# Patient Record
Sex: Male | Born: 1965 | Race: Black or African American | Hispanic: No | Marital: Married | Smoking: Current every day smoker
Health system: Southern US, Community
[De-identification: ages and names within clinical notes are randomized; demographics above are authoritative.]

## PROBLEM LIST (undated history)

## (undated) DIAGNOSIS — J439 Emphysema, unspecified: Secondary | ICD-10-CM

## (undated) DIAGNOSIS — F329 Major depressive disorder, single episode, unspecified: Secondary | ICD-10-CM

## (undated) DIAGNOSIS — F259 Schizoaffective disorder, unspecified: Secondary | ICD-10-CM

## (undated) DIAGNOSIS — R0789 Other chest pain: Secondary | ICD-10-CM

## (undated) DIAGNOSIS — B20 Human immunodeficiency virus [HIV] disease: Secondary | ICD-10-CM

## (undated) DIAGNOSIS — J449 Chronic obstructive pulmonary disease, unspecified: Secondary | ICD-10-CM

## (undated) DIAGNOSIS — W3400XA Accidental discharge from unspecified firearms or gun, initial encounter: Secondary | ICD-10-CM

## (undated) DIAGNOSIS — B1910 Unspecified viral hepatitis B without hepatic coma: Secondary | ICD-10-CM

## (undated) DIAGNOSIS — F1990 Other psychoactive substance use, unspecified, uncomplicated: Secondary | ICD-10-CM

## (undated) DIAGNOSIS — F209 Schizophrenia, unspecified: Secondary | ICD-10-CM

## (undated) DIAGNOSIS — M1990 Unspecified osteoarthritis, unspecified site: Secondary | ICD-10-CM

## (undated) DIAGNOSIS — I10 Essential (primary) hypertension: Secondary | ICD-10-CM

## (undated) DIAGNOSIS — N1330 Unspecified hydronephrosis: Secondary | ICD-10-CM

## (undated) DIAGNOSIS — Z21 Asymptomatic human immunodeficiency virus [HIV] infection status: Secondary | ICD-10-CM

## (undated) DIAGNOSIS — F32A Depression, unspecified: Secondary | ICD-10-CM

## (undated) DIAGNOSIS — M109 Gout, unspecified: Secondary | ICD-10-CM

## (undated) DIAGNOSIS — Z87442 Personal history of urinary calculi: Secondary | ICD-10-CM

## (undated) HISTORY — DX: Unspecified hydronephrosis: N13.30

## (undated) HISTORY — DX: Imprisonment and other incarceration: Z65.1

## (undated) HISTORY — DX: Other chest pain: R07.89

## (undated) HISTORY — DX: Human immunodeficiency virus (HIV) disease: B20

## (undated) HISTORY — DX: Major depressive disorder, single episode, unspecified: F32.9

## (undated) HISTORY — DX: Unspecified viral hepatitis B without hepatic coma: B19.10

## (undated) HISTORY — PX: OTHER SURGICAL HISTORY: SHX169

## (undated) HISTORY — DX: Asymptomatic human immunodeficiency virus (hiv) infection status: Z21

---

## 2003-12-23 ENCOUNTER — Ambulatory Visit (HOSPITAL_COMMUNITY): Admission: RE | Admit: 2003-12-23 | Discharge: 2003-12-23 | Payer: Self-pay | Admitting: Family Medicine

## 2004-01-10 ENCOUNTER — Emergency Department (HOSPITAL_COMMUNITY): Admission: EM | Admit: 2004-01-10 | Discharge: 2004-01-10 | Payer: Self-pay | Admitting: Emergency Medicine

## 2004-03-07 ENCOUNTER — Emergency Department (HOSPITAL_COMMUNITY): Admission: EM | Admit: 2004-03-07 | Discharge: 2004-03-07 | Payer: Self-pay | Admitting: Emergency Medicine

## 2010-06-26 ENCOUNTER — Ambulatory Visit: Payer: Self-pay | Admitting: Internal Medicine

## 2010-06-26 DIAGNOSIS — B20 Human immunodeficiency virus [HIV] disease: Secondary | ICD-10-CM

## 2010-06-26 LAB — CONVERTED CEMR LAB
ALT: 156 units/L — ABNORMAL HIGH (ref 0–53)
Basophils Absolute: 0 10*3/uL (ref 0.0–0.1)
Bilirubin Urine: NEGATIVE
CO2: 25 meq/L (ref 19–32)
Calcium: 9 mg/dL (ref 8.4–10.5)
Chlamydia, Swab/Urine, PCR: NEGATIVE
Chloride: 105 meq/L (ref 96–112)
Cholesterol: 157 mg/dL (ref 0–200)
Glucose, Bld: 86 mg/dL (ref 70–99)
HCV Ab: NEGATIVE
HIV: REACTIVE
Hemoglobin: 13.2 g/dL (ref 13.0–17.0)
Hep A Total Ab: POSITIVE — AB
Hep B Core Total Ab: POSITIVE — AB
Lymphocytes Relative: 43 % (ref 12–46)
Lymphs Abs: 1.4 10*3/uL (ref 0.7–4.0)
Monocytes Absolute: 0.3 10*3/uL (ref 0.1–1.0)
Neutro Abs: 1.6 10*3/uL — ABNORMAL LOW (ref 1.7–7.7)
Platelets: 143 10*3/uL — ABNORMAL LOW (ref 150–400)
RDW: 12.8 % (ref 11.5–15.5)
Sodium: 141 meq/L (ref 135–145)
Specific Gravity, Urine: 1.025 (ref 1.005–1.030)
Total Protein: 7.3 g/dL (ref 6.0–8.3)
Urine Glucose: NEGATIVE mg/dL
Urobilinogen, UA: 0.2 (ref 0.0–1.0)
WBC: 3.3 10*3/uL — ABNORMAL LOW (ref 4.0–10.5)
pH: 5.5 (ref 5.0–8.0)

## 2010-07-11 ENCOUNTER — Ambulatory Visit: Payer: Self-pay | Admitting: Infectious Disease

## 2010-07-11 DIAGNOSIS — F1220 Cannabis dependence, uncomplicated: Secondary | ICD-10-CM

## 2010-07-11 DIAGNOSIS — B181 Chronic viral hepatitis B without delta-agent: Secondary | ICD-10-CM

## 2010-07-11 DIAGNOSIS — F17200 Nicotine dependence, unspecified, uncomplicated: Secondary | ICD-10-CM | POA: Insufficient documentation

## 2010-07-16 ENCOUNTER — Encounter: Payer: Self-pay | Admitting: Infectious Disease

## 2010-07-19 ENCOUNTER — Encounter (INDEPENDENT_AMBULATORY_CARE_PROVIDER_SITE_OTHER): Payer: Self-pay | Admitting: *Deleted

## 2010-08-03 ENCOUNTER — Telehealth (INDEPENDENT_AMBULATORY_CARE_PROVIDER_SITE_OTHER): Payer: Self-pay | Admitting: *Deleted

## 2010-09-10 ENCOUNTER — Telehealth: Payer: Self-pay | Admitting: Infectious Disease

## 2010-11-08 ENCOUNTER — Telehealth: Payer: Self-pay

## 2010-12-18 NOTE — Progress Notes (Signed)
Summary: Requesting medications   Phone Note Call from Patient   Summary of Call: Requsting samples of Atripla,  ADAP referral pending. Pt is is ready to start meds.   CD4 380.  Dr Algis Liming added Atripla at his last OV . Tomasita Morrow RN  September 10, 2010 3:54 PM   Follow-up for Phone Call        Sample given of Atripla Lot # [id number]  exp 05/2012 pt informed he can pick up samples  Tomasita Morrow RN  September 10, 2010 4:03 PM

## 2010-12-18 NOTE — Assessment & Plan Note (Signed)
Summary: Nurse Visit (Infectious Disease)    Medical History  Orders Added: 1)  T-Comprehensive Metabolic Panel [[id number]] 2)  T-CBC w/Diff [[id number]] 3)  T-CD4SP [facility]) [CD4SP] 4)  T-Chlamydia  Probe, urine [phone] 5)  T-GC Probe, urine [phone] 6)  T-Hepatitis B Surface Antigen [52778-24235] 7)  T-Hepatitis B Surface Antibody [36144-31540] 8)  T-Hepatitis B Core Antibody [08676-19509] 9)  T-Hepatitis C Antibody [32671-24580] 10)  T-HIV Antibody  (Reflex) [[id number]] 11)  T-HIV Ab Confirmatory Test/Western Blot [[id number]] 12)  T-Lipid Profile [80061-22930] 13)  T-RPR (Syphilis) [79024-09735] 14)  T-Urinalysis [81003-65000] 15)  T-Hepatitis A Antibody [32992-42683] 16)  T-Hepatitis C Viral Load [87522-80119] 17)  T-HIV1 Quant rflx Ultra or Genotype [phone]  Appended Document: New Patient 042(intake)    Clinical Lists Changes  Observations: Added new observation of REC_MAIL: Yes (06/28/2010 10:11) Added new observation of RECPHONECALL: Yes (06/28/2010 10:11) Added new observation of REC_MESSAGE: Yes (06/28/2010 10:11) Added new observation of INFECTDIS MD: Daiva Eves (06/28/2010 10:11) Added new observation of DATE1STVISIT: 06/25/2010 (06/28/2010 10:11) Added new observation of MARITAL STAT: Single (06/28/2010 10:11) Added new observation of LATINO/HISP: No (06/28/2010 10:11) Added new observation of RACE: African American (06/28/2010 10:11) Added new observation of TB PPDRESULT: negative (06/28/2010 10:11) Added new observation of PPD RESULT: < 5mm (06/28/2010 10:11) Added new observation of TB-PPD RDDTE: 06/07/2010 (06/28/2010 10:11) Added new observation of SH REVIEWED: last 6 months  1 male partner  last year 2 male partners  lifetime 20 partners all male (06/28/2010 10:11) Added new observation of PREVENTPOS: 06/26/2010 (06/28/2010 10:11) Added new observation of GENDER: Male (06/28/2010 10:11) Added new observation of HIV RISK BEH:  MSM (06/28/2010 10:11) Added new observation of HIV INIT DX: 05/09/2010 (06/28/2010 10:11) Added new observation of DRUG USE: Yes (06/28/2010 10:11) Added new observation of ALCOHOL USE: social (06/28/2010 10:11) Added new observation of ALCOHOL COMM: Yes (06/28/2010 10:11) Added new observation of SMOK ADVICE: yes (06/28/2010 10:11) Added new observation of SMOK HX PPD: 1 1/2  (06/28/2010 10:11) Added new observation of SMOK YR ST: at age 37 (06/28/2010 10:11) Added new observation of TOBACCO USE: current (06/28/2010 10:11) Added new observation of PAYOR: No Insurance (06/28/2010 10:11) Added new observation of PLATELETK/UL: 128 K/uL (06/04/2010 16:35) Added new observation of WBC COUNT: 3.9 10*3/microliter (06/04/2010 16:35) Added new observation of HGB: 42.4 g/dL (89/21/1940 74:08) Added new observation of T-HELPER %: 23.5 % (06/04/2010 16:35) Added new observation of CD4 COUNT: 329 microliters (06/04/2010 16:35) Added new observation of HEPAVAX #1: Historical  (06/04/2010 16:29) Added new observation of PNEUMOVAX: Historical  (06/04/2010 16:29) Added new observation of TD BOOSTER: Historical  (06/04/2010 16:29)        Infectious Disease New Patient Intake Referring MD/Agency: [facility] Address: [address] [place], [place] [address]  Return Appointment Date: 07/11/2010  With Physician: Dr Yisroel Ramming Medical Records: Received Health Insurance / Payor: No Insurance Employer: None      Do you have a Primary physician: No Are family members aware of patient's diagnosis?  If so, are they supportive? Brother and Mother  Describe patient's current social support (family, friends, support groups): Good   Medical History Medical Problems OTHER than HIV: No   Medication Allergies: No   Medications: No   Medical Hx Comments: 2008 Biopsy of Right chest mass which was benign per pt during his incarceration.   Family History Diabetes:   Family Side: Maternal, Paternal  Comments:  Father 35, living Mother 87 Living   Tobacco use: current Year started: at  age 55 Amt: 1 1/2  packs per day. Counseled to quit/cut down: yes  Behavioral Health Assessment Have you ever been diagnosed with depression or mental illness? No  Do you drink alcohol? Yes Frequency: social Alcohol Beverage Type(s): alcohol, wine , beer   Do you use recreational drugs? Yes Last Date of Consumption: 06/18/2010 Frequency: frequent  Drugs: marijuana  Type of Use: 3 joints daily  Do you feel you have a problem with drugs and/or alcohol? No   Have you ever been in a treatment facility for any addiction? No If you are currently using drugs and/or alcohol, would you like help for this addiction? No  HIV Intake Information When did you first test positive for HIV? 05/09/2010 Type of test Conducted: WB   Where was this test performed?  Name of Agency: [facility]   City/State: [facility]: [place]  Was this your first time ever being tested or HIV? Yes Risk Factor(s) for HIV: MSM  Method of Exposure to HIV: Homosexual Intercourse-Receptive Have you ever been hospitalized for any HIV-related condition? No  Have you ever been under the care of a physician for being HIV positive? No  Newly Diagnosed Patients Has a Disease Intervention Specialist from the Health Department contacted the patient? Yes.   The patient has been informed that the [facility] Department will contact ALL newly reported cases. Health Department Contact:  [phone]   (SSN is needed for confirmation)  Reported Today: 06/26/2010 Health Department Contact:  [phone]            (SSN is needed for confirmation)  Person Reporting: prev/reported Do you have any Non-HIV related medical conditions or other prior hospitalizations or surgeries? No  HIV Medications Information The patient is currently NOT taking any HIV medications.  Infection History  Patient has been diagnosed with the following  opportunistic infections: Are there any other symptoms you need to discuss? No Have you received literature/education prior to this visit about HIV/AIDS? Yes Do you understand the meaning of a Viral Load? No Do you understand the meaning of a CD4 count? No Lab Values Education/Handout Given Yes Medication Education/Handout Given Yes  Sexual History Are you in a current relationship? No Safe Sex Counseling/Pamphlet Given Sexual History Comments: last 6 months  1 male partner  last year 2 male partners  lifetime 20 partners all male  Evaluation and Follow-Up INTAKE CHECK LIST: HIV Education, Safe Sex Counseling, Case Management Referral, HIV Material Given, Juanell Fairly Consent  Prevention For Positives: 06/26/2010   Safe sex practices discussed with patient. Condoms offered. Juanell Fairly Consent: Yes Are you in need of condoms at this time? No Our patient has been informed that condoms are always available in this clinic.   Brochure Provided for Above Organizations? Yes Name of Agency: [facility] Referral Does patient have problems that warrant Social Worker referral? No   Immunization History:  Tetanus/Td Immunization History:    Tetanus/Td:  historical (06/04/2010)  Pneumovax Immunization History:    Pneumovax:  historical (06/04/2010)  Hepatitis A Immunization History:    Hepatitis A # 1:  historical (06/04/2010)  PPD Results    Date of reading: 06/07/2010    Results: < 5mm    Interpretation: negative     -  Date:  06/04/2010    Hemoglobin: 42.4    WBC: 3.9    Platelets: 128           Prevention For Positives: 06/26/2010   Safe sex  practices discussed with patient. Condoms offered.

## 2010-12-18 NOTE — Progress Notes (Signed)
Summary: ncadap pending approval  Phone Note Other Incoming   Caller: Francia Greaves @ [facility] Reason for Call: Get patient information Summary of Call: patient's SW called on behalf of him wanting to know if he has medication as of yet.  I called back to [facility] to let Baird Lyons know that I have not heard whether patient has been approved as of yet to receive medications through ADAP.  I did explain to [facility] the waiting process.  I also called down to [facility] to the [facility] (Purchase of Medical Care  Services-ADAP ) office to speak with one of the CM for the state ADAP program.  The original CM is Sheralyn Boatman who is on vac this week.  I left a message on another CM--Mike Teena Dunk voicemail to check into seeing if [place] has been approved so I can get the appropriate information needed to call in patient's medications so he may get started taking them. Initial call taken by: Paulo Fruit  BS,CPht II,MPH,  August 03, 2010 4:00 PM

## 2010-12-18 NOTE — Miscellaneous (Signed)
Summary: clinical update/ryan white adap app completed  Clinical Lists Changes  Observations: Added new observation of RWTITLE: B (07/19/2010 14:50) Added new observation of AIDSDAP: Pending (07/19/2010 14:50) Added new observation of INCOMESOURCE: none (07/19/2010 14:50) Added new observation of HOUSEINCOME: 0  (07/19/2010 14:50) Added new observation of #CHILD<18 IN: No  (07/19/2010 14:50) Added new observation of FAMILYSIZE: 1  (07/19/2010 14:50) Added new observation of HOUSING: Stable/permanent  (07/19/2010 14:50) Added new observation of FINASSESSDT: 07/12/2010  (07/19/2010 14:50) Added new observation of RW VITAL STA: Active  (07/19/2010 14:50) Added new observation of PATNTCOUNTY: [place]  (07/19/2010 14:50) Added new observation of RWPARTICIP: Yes  (07/19/2010 14:50)

## 2010-12-18 NOTE — Miscellaneous (Signed)
Summary: HIPAA Restrictions  HIPAA Restrictions   Imported By: Florinda Marker 06/28/2010 09:40:10  _____________________________________________________________________  External Attachment:    Type:   Image     Comment:   External Document

## 2010-12-18 NOTE — Assessment & Plan Note (Signed)
Summary: new 042   Visit Type:  Consult  CC:  new pt. to establish and lab results.  History of Present Illness: 45 yo AA male with HIV,  hepatits B  (Sag positive, core ab positive) cd4 of just in the 300s who presents to clinic for consieration of antivral therapy. I spent greater than 45 minutes with this pt including greater than 50% time face to face counselling. We reviewed all of the first line DHSS options and newest one pill once a day compleara as well. IN the end pt preferred atripla and I think he can comply with nightly atripla.   Problems Prior to Update: 1)  HIV Infection  (ICD-042)  Medications Prior to Update: 1)  None  Current Medications (verified): 1)  Atripla 600-200-300 Mg Tabs (Efavirenz-Emtricitab-Tenofovir)  Allergies (verified): No Known Drug Allergies   Preventive Screening-Counseling & Management  Alcohol-Tobacco     Alcohol drinks/day: socially     Alcohol type: all     Smoking Status: current     Packs/Day: 1.5     Year Quit: 1977  Caffeine-Diet-Exercise     Caffeine use/day: coffee, tea, soda 2 per day     Does Patient Exercise: yes     Type of exercise: cardio, weights     Exercise (avg: min/session): >60     Times/week: 7  Safety-Violence-Falls     Seat Belt Use: yes      Drug Use:  current and marijuana.    Comments: pt. given condoms   Current Allergies (reviewed today): No known allergies  Past History:  Past Medical History: HIV Hepatitis B Smoker Tattoos  Past Surgical History: none  Family History: noncontributory  Social History: unemployed, smokes tobacco and mairijuan occasional alcohol.Drug Use:  current, marijuana  Vital Signs:  Patient profile:   45 year old male Height:      69 inches (175.26 cm) Weight:      168.9 pounds (76.77 kg) BMI:     25.03 Temp:     98.5 degrees F (36.94 degrees C) oral Pulse rate:   76 / minute BP sitting:   125 / 82  (right arm)  Vitals Entered By: Wendall Mola  CMA Duncan Dull) (July 11, 2010 3:04 PM) CC: new pt. to establish and lab results Is Patient Diabetic? No Pain Assessment Patient in pain? no      Nutritional Status BMI of 25 - 29 = overweight Nutritional Status Detail appetite "good"  Have you ever been in a relationship where you felt threatened, hurt or afraid?No   Does patient need assistance? Functional Status Self care Ambulation Normal   Physical Exam  General:  alert, well-developed, well-nourished, and well-hydrated.   Head:  normocephalic and atraumatic.   Eyes:  vision grossly intact, pupils equal, and pupils round.   Ears:  no external deformities.   Nose:  no external deformity and no external erythema.   Mouth:  pharynx pink and moist, no erythema, and no exudates.   Neck:  supple and full ROM.   Lungs:  normal respiratory effort, no crackles, and no wheezes.   Heart:  normal rate, regular rhythm, no murmur, no gallop, and no rub.   Abdomen:  soft and no distention.   Msk:  normal ROM and no joint tenderness.   Extremities:  No clubbing, cyanosis, edema, or deformity noted with normal full range of motion of all joints.   Neurologic:  alert & oriented X3, strength normal in all extremities, and gait normal.  Skin:  tatoosno rashes.   Psych:  Oriented X3, memory intact for recent and remote, and normally interactive.             Prevention For Positives: 07/11/2010   Safe sex practices discussed with patient. Condoms offered.   Education Materials Provided: 07/11/2010 Safe sex practices discussed with patient. Condoms offered.                          Impression & Recommendations:  Problem # 1:  HIV INFECTION (ICD-042) Will start atripla, returnt o clinic in 6 wks Orders: Consultation Level V (99245)Future Orders: T-CD4SP ([facility]) (CD4SP) ... 08/13/2010 T-HIV Viral Load [phone]) ... 08/13/2010 T-CBC w/Diff ([id number]) ... 08/13/2010 T-Comprehensive Metabolic Panel [phone]) ...  08/13/2010 T-Hepatitis B Surface Antibody [phone]) ... 08/13/2010  Problem # 2:  HEPATITIS B, CHRONIC (ICD-070.32)  seems likely. 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    Immunizations Administered:  Influenza Vaccine # 1:    Vaccine Type: Fluvax Non-MCR    Site: right deltoid    Mfr: Novartis    Dose: 0.5 ml    Route: IM    Given by: Kathi Simpers CMA(AAMA)    Exp. Date: 02/17/2011    Lot #: [id number]    VIS given: 06/11/07 version given July 11, 2010.  Flu Vaccine Consent Questions:    Do you have a history of severe allergic reactions to this vaccine? no    Any prior history of allergic reactions to egg and/or gelatin? no    Do you have a sensitivity to the preservative Thimersol? no    Do you have a past history of Guillan-Barre Syndrome? no    Do you currently have an acute febrile illness? no    Have you ever had a severe reaction to latex? no    Vaccine information given and explained to patient? yes Prescriptions: ATRIPLA 600-200-300 MG TABS (EFAVIRENZ-EMTRICITAB-TENOFOVIR)   #30 x 11   Entered and Authorized by:   Acey Lav MD   Signed by:   Paulette Blanch Dam MD on 07/11/2010   Method used:   Print then Give to Patient   RxID:   [id number]

## 2010-12-18 NOTE — Miscellaneous (Signed)
Summary: [facility]  [place] [facility]   Imported By: Florinda Marker 07/16/2010 16:04:46  _____________________________________________________________________  External Attachment:    Type:   Image     Comment:   External Document

## 2010-12-20 NOTE — Progress Notes (Signed)
Summary: [facility] calling  Phone Note From Pharmacy   Caller: [facility]  Summary of Call: Trying to contact patient with medicaiton refills.  He is not responding at the above phone number.  Tomasita Morrow RN  November 08, 2010 9:41 AM

## 2011-02-01 LAB — T-HELPER CELL (CD4) - (RCID CLINIC ONLY): CD4 T Cell Abs: 380 uL — ABNORMAL LOW (ref 400–2700)

## 2011-02-05 ENCOUNTER — Emergency Department (HOSPITAL_COMMUNITY): Payer: Self-pay

## 2011-02-05 ENCOUNTER — Emergency Department (HOSPITAL_COMMUNITY)
Admission: EM | Admit: 2011-02-05 | Discharge: 2011-02-05 | Disposition: A | Discharge status: Home / Self Care | Payer: Self-pay | Attending: Emergency Medicine | Admitting: Emergency Medicine

## 2011-02-05 DIAGNOSIS — S60229A Contusion of unspecified hand, initial encounter: Secondary | ICD-10-CM | POA: Insufficient documentation

## 2011-02-05 DIAGNOSIS — M25539 Pain in unspecified wrist: Secondary | ICD-10-CM | POA: Insufficient documentation

## 2011-02-05 DIAGNOSIS — M79609 Pain in unspecified limb: Secondary | ICD-10-CM | POA: Insufficient documentation

## 2011-02-05 DIAGNOSIS — Z21 Asymptomatic human immunodeficiency virus [HIV] infection status: Secondary | ICD-10-CM | POA: Insufficient documentation

## 2011-02-05 DIAGNOSIS — S60219A Contusion of unspecified wrist, initial encounter: Secondary | ICD-10-CM | POA: Insufficient documentation

## 2011-11-20 ENCOUNTER — Other Ambulatory Visit (INDEPENDENT_AMBULATORY_CARE_PROVIDER_SITE_OTHER): Payer: Self-pay

## 2011-11-20 ENCOUNTER — Other Ambulatory Visit: Payer: Self-pay | Admitting: Infectious Disease

## 2011-11-20 DIAGNOSIS — B20 Human immunodeficiency virus [HIV] disease: Secondary | ICD-10-CM

## 2011-11-20 DIAGNOSIS — Z79899 Other long term (current) drug therapy: Secondary | ICD-10-CM

## 2011-11-20 DIAGNOSIS — Z113 Encounter for screening for infections with a predominantly sexual mode of transmission: Secondary | ICD-10-CM

## 2011-11-21 ENCOUNTER — Telehealth: Payer: Self-pay | Admitting: Infectious Disease

## 2011-11-21 LAB — URINALYSIS, ROUTINE W REFLEX MICROSCOPIC
Bilirubin Urine: NEGATIVE
Glucose, UA: NEGATIVE mg/dL
Leukocytes, UA: NEGATIVE
Specific Gravity, Urine: 1.024 (ref 1.005–1.030)

## 2011-11-21 LAB — COMPREHENSIVE METABOLIC PANEL
Alkaline Phosphatase: 83 U/L (ref 39–117)
BUN: 14 mg/dL (ref 6–23)
Creat: 1.24 mg/dL (ref 0.50–1.35)
Glucose, Bld: 91 mg/dL (ref 70–99)
Sodium: 141 mEq/L (ref 135–145)
Total Bilirubin: 0.7 mg/dL (ref 0.3–1.2)

## 2011-11-21 LAB — T-HELPER CELL (CD4) - (RCID CLINIC ONLY)
CD4 % Helper T Cell: 23 % — ABNORMAL LOW (ref 33–55)
CD4 T Cell Abs: 390 uL — ABNORMAL LOW (ref 400–2700)

## 2011-11-21 LAB — LIPID PANEL
Cholesterol: 149 mg/dL (ref 0–200)
HDL: 37 mg/dL — ABNORMAL LOW (ref >39)
Triglycerides: 60 mg/dL (ref <150)

## 2011-11-21 LAB — RPR

## 2011-11-21 LAB — CBC WITH DIFFERENTIAL/PLATELET
Basophils Relative: 0 % (ref 0–1)
Eosinophils Absolute: 0 10*3/uL (ref 0.0–0.7)
Hemoglobin: 13.7 g/dL (ref 13.0–17.0)
MCH: 32.4 pg (ref 26.0–34.0)
MCHC: 34.3 g/dL (ref 30.0–36.0)
Monocytes Absolute: 0.2 10*3/uL (ref 0.1–1.0)
Monocytes Relative: 5 % (ref 3–12)
Neutrophils Relative %: 54 % (ref 43–77)

## 2011-11-21 MED ORDER — AZITHROMYCIN 500 MG PO TABS: 1000.0000 mg | ORAL_TABLET | Freq: Every day | ORAL | Status: DC

## 2011-11-21 NOTE — Telephone Encounter (Signed)
Chlamydia needs to be treated with azithromycin 1gram daily, vs doxycyline 100mg  twice daily for 7 days. Curtis Hodge can we bring pt here for the 1gram dose of azithro?

## 2011-11-22 ENCOUNTER — Other Ambulatory Visit: Payer: Self-pay | Admitting: Licensed Clinical Social Worker

## 2011-11-22 DIAGNOSIS — A749 Chlamydial infection, unspecified: Secondary | ICD-10-CM

## 2011-11-22 LAB — HIV-1 RNA QUANT-NO REFLEX-BLD: HIV-1 RNA Quant, Log: 3.87 {Log} — ABNORMAL HIGH (ref <1.30)

## 2011-11-22 MED ORDER — AZITHROMYCIN 250 MG PO TABS
1000.0000 mg | ORAL_TABLET | Freq: Once | ORAL | Status: AC
  Administered 2011-11-22: 1000 mg via ORAL

## 2011-11-22 NOTE — Telephone Encounter (Signed)
Called patient and asked him to return my call, the patient has a positive chlamydia test and needs to come to the office for a dose of azithromycin per Dr. Daiva Eves

## 2011-11-22 NOTE — Telephone Encounter (Signed)
Spoke with the patient and he is coming in today to take the azithromycin

## 2011-12-04 ENCOUNTER — Ambulatory Visit: Payer: Self-pay | Admitting: Infectious Disease

## 2011-12-06 ENCOUNTER — Encounter: Payer: Self-pay | Admitting: Infectious Disease

## 2011-12-06 ENCOUNTER — Ambulatory Visit (INDEPENDENT_AMBULATORY_CARE_PROVIDER_SITE_OTHER): Payer: Self-pay | Admitting: Infectious Disease

## 2011-12-06 VITALS — BP 132/76 | HR 74 | Temp 98.1°F | Wt 168.2 lb

## 2011-12-06 DIAGNOSIS — F17200 Nicotine dependence, unspecified, uncomplicated: Secondary | ICD-10-CM

## 2011-12-06 DIAGNOSIS — Z23 Encounter for immunization: Secondary | ICD-10-CM

## 2011-12-06 DIAGNOSIS — B20 Human immunodeficiency virus [HIV] disease: Secondary | ICD-10-CM

## 2011-12-06 DIAGNOSIS — B181 Chronic viral hepatitis B without delta-agent: Secondary | ICD-10-CM

## 2011-12-06 DIAGNOSIS — F1220 Cannabis dependence, uncomplicated: Secondary | ICD-10-CM

## 2011-12-06 MED ORDER — ONDANSETRON 4 MG PO TBDP: 4.0000 mg | ORAL_TABLET | Freq: Three times a day (TID) | ORAL | Status: AC | PRN

## 2011-12-06 MED ORDER — EMTRICITABINE-TENOFOVIR DF 200-300 MG PO TABS: 1.0000 | ORAL_TABLET | Freq: Every day | ORAL | Status: DC

## 2011-12-06 MED ORDER — DARUNAVIR ETHANOLATE 800 MG PO TABS: 800.0000 mg | ORAL_TABLET | Freq: Every day | ORAL | Status: DC

## 2011-12-06 MED ORDER — RITONAVIR 100 MG PO TABS: 100.0000 mg | ORAL_TABLET | Freq: Every day | ORAL | Status: DC

## 2011-12-06 NOTE — Assessment & Plan Note (Signed)
truvada will take care of this

## 2011-12-06 NOTE — Assessment & Plan Note (Signed)
I am not terribly worried about this

## 2011-12-06 NOTE — Assessment & Plan Note (Signed)
Will work on this in the future. He has struggled with quiting in the past

## 2011-12-06 NOTE — Progress Notes (Signed)
  Subjective:    Patient ID: Curtis Hodge, male    DOB: 1966/10/22, 46 y.o.   MRN: [id number]  HPI  46 year old [place] American male with HIV, Hepatitis B coinfection whom I saw in August of 2011 and attempted to rx atripla. He never filled the med and was lost to followup until brought in by [facility] counselor. He has apparently filled out all of his ADAP paperwork. We had exhaustive discussion of ARV regimens and I recommended we go with prezista norvir and truvada for now to make sure he is highly compliant to to prevent R emerging. This will also rx his Hep B. We gave him flu shot today as well. I spent greater than 45 minutes with the patient including greater than 50% of time in face to face counsel of the patient and in coordination of their care.   Review of Systems  Constitutional: Negative for fever, chills, diaphoresis, activity change, appetite change, fatigue and unexpected weight change.  HENT: Negative for congestion, sore throat, rhinorrhea, sneezing, trouble swallowing and sinus pressure.   Eyes: Negative for photophobia and visual disturbance.  Respiratory: Negative for cough, chest tightness, shortness of breath, wheezing and stridor.   Cardiovascular: Negative for chest pain, palpitations and leg swelling.  Gastrointestinal: Negative for nausea, vomiting, abdominal pain, diarrhea, constipation, blood in stool, abdominal distention and anal bleeding.  Genitourinary: Negative for dysuria, hematuria, flank pain and difficulty urinating.  Musculoskeletal: Negative for myalgias, back pain, joint swelling, arthralgias and gait problem.  Skin: Negative for color change, pallor, rash and wound.  Neurological: Negative for dizziness, tremors, weakness and light-headedness.  Hematological: Negative for adenopathy. Does not bruise/bleed easily.  Psychiatric/Behavioral: Negative for behavioral problems, confusion, sleep disturbance, dysphoric mood, decreased concentration and agitation.        Objective:   Physical Exam  Constitutional: He is oriented to person, place, and time. He appears well-developed and well-nourished. No distress.  HENT:  Head: Normocephalic and atraumatic.  Mouth/Throat: Oropharynx is clear and moist. No oropharyngeal exudate.  Eyes: Conjunctivae and EOM are normal. Pupils are equal, round, and reactive to light. No scleral icterus.  Neck: Normal range of motion. Neck supple. No JVD present.  Cardiovascular: Normal rate, regular rhythm and normal heart sounds.  Exam reveals no gallop and no friction rub.   No murmur heard. Pulmonary/Chest: Effort normal and breath sounds normal. No respiratory distress. He has no wheezes. He has no rales. He exhibits no tenderness.  Abdominal: He exhibits no distension and no mass. There is no tenderness. There is no rebound and no guarding.  Musculoskeletal: He exhibits no edema and no tenderness.  Lymphadenopathy:    He has no cervical adenopathy.  Neurological: He is alert and oriented to person, place, and time. He has normal reflexes. He exhibits normal muscle tone. Coordination normal.  Skin: Skin is warm and dry. He is not diaphoretic. No erythema. No pallor.  Psychiatric: He has a normal mood and affect. His behavior is normal. Judgment and thought content normal.          Assessment & Plan:  HIV INFECTION Start prezista norvir and truvada. zofran prn for nausea in case he ahs this with this PI regimen, bring back in a month for labs  HEPATITIS B, CHRONIC truvada will take care of this  SMOKER Will work on this in the future. He has struggled with quiting in the past  CANNABIS ABUSE, EPISODIC I am not terribly worried about this

## 2011-12-06 NOTE — Assessment & Plan Note (Signed)
Start prezista norvir and [place]. zofran prn for nausea in case he ahs this with this PI regimen, bring back in a month for labs

## 2011-12-10 ENCOUNTER — Telehealth: Payer: Self-pay | Admitting: *Deleted

## 2011-12-10 NOTE — Telephone Encounter (Signed)
RN could not ascertain why the pt was called and the pt didn't know either.

## 2011-12-17 ENCOUNTER — Encounter: Payer: Self-pay | Admitting: *Deleted

## 2011-12-18 ENCOUNTER — Telehealth: Payer: Self-pay | Admitting: *Deleted

## 2011-12-18 NOTE — Telephone Encounter (Signed)
Gearldine Bienenstock, a nurse from the health dept called [phone]) & asked to speak with [place] about treatment for this pt. I called her back & left a message asking her to call me back

## 2011-12-18 NOTE — Telephone Encounter (Signed)
Called and left Brandy at the health department a message [phone]). Patient was treated for Chlamydia with Azithromycin 1 gram on 11/22/11. Wendall Mola CMA

## 2011-12-19 ENCOUNTER — Other Ambulatory Visit: Payer: Self-pay | Admitting: *Deleted

## 2011-12-19 DIAGNOSIS — B20 Human immunodeficiency virus [HIV] disease: Secondary | ICD-10-CM

## 2011-12-19 MED ORDER — EMTRICITABINE-TENOFOVIR DF 200-300 MG PO TABS: 1.0000 | ORAL_TABLET | Freq: Every day | ORAL | Status: DC

## 2011-12-19 MED ORDER — RITONAVIR 100 MG PO TABS: 100.0000 mg | ORAL_TABLET | Freq: Every day | ORAL | Status: DC

## 2011-12-19 MED ORDER — DARUNAVIR ETHANOLATE 800 MG PO TABS: 800.0000 mg | ORAL_TABLET | Freq: Every day | ORAL | Status: DC

## 2011-12-19 NOTE — Telephone Encounter (Signed)
He has ADAP now. [facility]

## 2012-01-01 ENCOUNTER — Other Ambulatory Visit: Payer: Self-pay | Admitting: Infectious Disease

## 2012-01-01 ENCOUNTER — Other Ambulatory Visit: Payer: Self-pay

## 2012-01-01 DIAGNOSIS — B20 Human immunodeficiency virus [HIV] disease: Secondary | ICD-10-CM

## 2012-01-01 LAB — COMPLETE METABOLIC PANEL WITH GFR
Alkaline Phosphatase: 87 U/L (ref 39–117)
Creat: 1.16 mg/dL (ref 0.50–1.35)
GFR, Est Non African American: 76 mL/min
Glucose, Bld: 90 mg/dL (ref 70–99)
Sodium: 143 mEq/L (ref 135–145)
Total Bilirubin: 0.3 mg/dL (ref 0.3–1.2)
Total Protein: 7.3 g/dL (ref 6.0–8.3)

## 2012-01-01 LAB — CBC WITH DIFFERENTIAL/PLATELET
Basophils Relative: 0 % (ref 0–1)
HCT: 39.3 % (ref 39.0–52.0)
Hemoglobin: 13.9 g/dL (ref 13.0–17.0)
Lymphocytes Relative: 48 % — ABNORMAL HIGH (ref 12–46)
Lymphs Abs: 1.5 10*3/uL (ref 0.7–4.0)
MCHC: 35.4 g/dL (ref 30.0–36.0)
Monocytes Absolute: 0.1 10*3/uL (ref 0.1–1.0)
Monocytes Relative: 4 % (ref 3–12)
Neutro Abs: 1.5 10*3/uL — ABNORMAL LOW (ref 1.7–7.7)
RBC: 4.18 MIL/uL — ABNORMAL LOW (ref 4.22–5.81)

## 2012-01-02 LAB — T-HELPER CELL (CD4) - (RCID CLINIC ONLY)
CD4 % Helper T Cell: 23 % — ABNORMAL LOW (ref 33–55)
CD4 T Cell Abs: 350 uL — ABNORMAL LOW (ref 400–2700)

## 2012-01-03 LAB — HIV-1 RNA QUANT-NO REFLEX-BLD: HIV-1 RNA Quant, Log: 3.91 {Log} — ABNORMAL HIGH (ref <1.30)

## 2012-01-08 ENCOUNTER — Other Ambulatory Visit: Payer: Self-pay | Admitting: Infectious Disease

## 2012-01-08 DIAGNOSIS — B20 Human immunodeficiency virus [HIV] disease: Secondary | ICD-10-CM

## 2012-01-08 NOTE — Progress Notes (Signed)
Ok I told Clydie Braun in the lab and she will add it.

## 2012-01-16 LAB — HIV-1 GENOTYPR PLUS

## 2012-01-22 ENCOUNTER — Ambulatory Visit: Payer: Self-pay | Admitting: Infectious Disease

## 2012-01-29 ENCOUNTER — Encounter: Payer: Self-pay | Admitting: Infectious Disease

## 2012-01-29 ENCOUNTER — Ambulatory Visit (INDEPENDENT_AMBULATORY_CARE_PROVIDER_SITE_OTHER): Payer: Self-pay | Admitting: Infectious Disease

## 2012-01-29 VITALS — BP 144/78 | HR 78 | Temp 97.7°F | Wt 161.0 lb

## 2012-01-29 DIAGNOSIS — IMO0001 Reserved for inherently not codable concepts without codable children: Secondary | ICD-10-CM

## 2012-01-29 DIAGNOSIS — B181 Chronic viral hepatitis B without delta-agent: Secondary | ICD-10-CM

## 2012-01-29 DIAGNOSIS — B20 Human immunodeficiency virus [HIV] disease: Secondary | ICD-10-CM

## 2012-01-29 DIAGNOSIS — F17200 Nicotine dependence, unspecified, uncomplicated: Secondary | ICD-10-CM

## 2012-01-29 LAB — COMPLETE METABOLIC PANEL WITH GFR
ALT: 18 U/L (ref 0–53)
CO2: 25 mEq/L (ref 19–32)
Calcium: 9 mg/dL (ref 8.4–10.5)
Chloride: 107 mEq/L (ref 96–112)
GFR, Est African American: 78 mL/min
GFR, Est Non African American: 68 mL/min
Glucose, Bld: 93 mg/dL (ref 70–99)
Sodium: 138 mEq/L (ref 135–145)
Total Protein: 7.3 g/dL (ref 6.0–8.3)

## 2012-01-29 LAB — CBC WITH DIFFERENTIAL/PLATELET
Basophils Absolute: 0 10*3/uL (ref 0.0–0.1)
Basophils Relative: 0 % (ref 0–1)
Eosinophils Absolute: 0 10*3/uL (ref 0.0–0.7)
Eosinophils Relative: 1 % (ref 0–5)
HCT: 41.9 % (ref 39.0–52.0)
MCH: 33.4 pg (ref 26.0–34.0)
MCHC: 35.1 g/dL (ref 30.0–36.0)
MCV: 95.2 fL (ref 78.0–100.0)
Monocytes Absolute: 0.2 10*3/uL (ref 0.1–1.0)
Platelets: 156 10*3/uL (ref 150–400)
RDW: 12.4 % (ref 11.5–15.5)

## 2012-01-29 MED ORDER — VARENICLINE TARTRATE 0.5 MG X 11 & 1 MG X 42 PO MISC: ORAL | Status: AC

## 2012-01-29 MED ORDER — VARENICLINE TARTRATE 1 MG PO TABS: 1.0000 mg | ORAL_TABLET | Freq: Two times a day (BID) | ORAL | Status: AC

## 2012-01-29 NOTE — Progress Notes (Signed)
  Subjective:    Patient ID: Curtis Hodge, male    DOB: [birth year], 46 y.o.   MRN: [id number]  HPI  46 year old [place] American man with HIV, just recently started on prezista norvir and truvada. We know his viral load was 7k in early January but dont know value on day he started rx, in Feb was 8k. CD4 still in 300 range. He is interested in smoking cesation and I will give him chantix. He is also concerned aobut his ability to build muscle mass and gain weight which has become more difficult recently.  Review of Systems  Constitutional: Negative for fever, chills, diaphoresis, activity change, appetite change, fatigue and unexpected weight change.  HENT: Negative for congestion, sore throat, rhinorrhea, sneezing, trouble swallowing and sinus pressure.   Eyes: Negative for photophobia and visual disturbance.  Respiratory: Negative for cough, chest tightness, shortness of breath, wheezing and stridor.   Cardiovascular: Negative for chest pain, palpitations and leg swelling.  Gastrointestinal: Negative for nausea, vomiting, abdominal pain, diarrhea, constipation, blood in stool, abdominal distention and anal bleeding.  Genitourinary: Negative for dysuria, hematuria, flank pain and difficulty urinating.  Musculoskeletal: Negative for myalgias, back pain, joint swelling, arthralgias and gait problem.  Skin: Negative for color change, pallor, rash and wound.  Neurological: Negative for dizziness, tremors, weakness and light-headedness.  Hematological: Negative for adenopathy. Does not bruise/bleed easily.  Psychiatric/Behavioral: Negative for behavioral problems, confusion, sleep disturbance, dysphoric mood, decreased concentration and agitation.       Objective:   Physical Exam  Constitutional: He is oriented to person, place, and time. He appears well-developed and well-nourished. No distress.  HENT:  Head: Normocephalic and atraumatic.  Mouth/Throat: Oropharynx is clear and moist. No  oropharyngeal exudate.  Eyes: Conjunctivae and EOM are normal. Pupils are equal, round, and reactive to light. No scleral icterus.  Neck: Normal range of motion. Neck supple. No JVD present.  Cardiovascular: Normal rate, regular rhythm and normal heart sounds.  Exam reveals no gallop and no friction rub.   No murmur heard. Pulmonary/Chest: Effort normal and breath sounds normal. No respiratory distress. He has no wheezes. He has no rales. He exhibits no tenderness.  Abdominal: He exhibits no distension and no mass. There is no tenderness. There is no rebound and no guarding.  Musculoskeletal: He exhibits no edema and no tenderness.  Lymphadenopathy:    He has no cervical adenopathy.  Neurological: He is alert and oriented to person, place, and time. He has normal reflexes. He exhibits normal muscle tone. Coordination normal.  Skin: Skin is warm and dry. He is not diaphoretic. No erythema. No pallor.  Psychiatric: He has a normal mood and affect. His behavior is normal. Judgment and thought content normal.          Assessment & Plan:

## 2012-01-29 NOTE — Assessment & Plan Note (Signed)
Recheck labs today, rtc in 2 months

## 2012-01-29 NOTE — Assessment & Plan Note (Signed)
rx chantix

## 2012-01-29 NOTE — Assessment & Plan Note (Signed)
On truvada 

## 2012-01-30 LAB — GC/CHLAMYDIA PROBE AMP, URINE: Chlamydia, Swab/Urine, PCR: NEGATIVE

## 2012-01-30 LAB — T-HELPER CELL (CD4) - (RCID CLINIC ONLY): CD4 % Helper T Cell: 26 % — ABNORMAL LOW (ref 33–55)

## 2012-05-21 ENCOUNTER — Encounter (HOSPITAL_COMMUNITY): Payer: Self-pay | Admitting: *Deleted

## 2012-05-21 ENCOUNTER — Encounter (HOSPITAL_COMMUNITY): Payer: Self-pay | Admitting: Anesthesiology

## 2012-05-21 ENCOUNTER — Emergency Department (HOSPITAL_COMMUNITY)
Admission: EM | Admit: 2012-05-21 | Discharge: 2012-05-21 | Disposition: A | Discharge status: Home / Self Care | Payer: Self-pay | Attending: Emergency Medicine | Admitting: Emergency Medicine

## 2012-05-21 ENCOUNTER — Emergency Department (HOSPITAL_COMMUNITY): Payer: Self-pay | Admitting: Anesthesiology

## 2012-05-21 ENCOUNTER — Encounter (HOSPITAL_COMMUNITY)
Admission: EM | Disposition: A | Discharge status: Home / Self Care | Payer: Self-pay | Source: Home / Self Care | Attending: Emergency Medicine

## 2012-05-21 ENCOUNTER — Emergency Department (HOSPITAL_COMMUNITY): Payer: Self-pay

## 2012-05-21 DIAGNOSIS — Y939 Activity, unspecified: Secondary | ICD-10-CM | POA: Insufficient documentation

## 2012-05-21 DIAGNOSIS — W268XXA Contact with other sharp object(s), not elsewhere classified, initial encounter: Secondary | ICD-10-CM | POA: Insufficient documentation

## 2012-05-21 DIAGNOSIS — IMO0002 Reserved for concepts with insufficient information to code with codable children: Secondary | ICD-10-CM

## 2012-05-21 DIAGNOSIS — S41109A Unspecified open wound of unspecified upper arm, initial encounter: Secondary | ICD-10-CM | POA: Insufficient documentation

## 2012-05-21 DIAGNOSIS — S0180XA Unspecified open wound of other part of head, initial encounter: Secondary | ICD-10-CM | POA: Insufficient documentation

## 2012-05-21 DIAGNOSIS — Z21 Asymptomatic human immunodeficiency virus [HIV] infection status: Secondary | ICD-10-CM | POA: Insufficient documentation

## 2012-05-21 DIAGNOSIS — Z1881 Retained glass fragments: Secondary | ICD-10-CM | POA: Insufficient documentation

## 2012-05-21 DIAGNOSIS — F17200 Nicotine dependence, unspecified, uncomplicated: Secondary | ICD-10-CM | POA: Insufficient documentation

## 2012-05-21 DIAGNOSIS — T7421XA Adult sexual abuse, confirmed, initial encounter: Secondary | ICD-10-CM | POA: Insufficient documentation

## 2012-05-21 HISTORY — PX: I & D EXTREMITY: SHX5045

## 2012-05-21 LAB — BASIC METABOLIC PANEL
BUN: 13 mg/dL (ref 6–23)
CO2: 22 mEq/L (ref 19–32)
Chloride: 101 mEq/L (ref 96–112)
Creatinine, Ser: 1.33 mg/dL (ref 0.50–1.35)
GFR calc Af Amer: 73 mL/min — ABNORMAL LOW (ref >90)

## 2012-05-21 LAB — CBC WITH DIFFERENTIAL/PLATELET
Basophils Relative: 0 % (ref 0–1)
Eosinophils Relative: 0 % (ref 0–5)
HCT: 37.5 % — ABNORMAL LOW (ref 39.0–52.0)
Hemoglobin: 13.1 g/dL (ref 13.0–17.0)
MCH: 33.6 pg (ref 26.0–34.0)
MCHC: 34.9 g/dL (ref 30.0–36.0)
MCV: 96.2 fL (ref 78.0–100.0)
Monocytes Absolute: 0.3 10*3/uL (ref 0.1–1.0)
Monocytes Relative: 6 % (ref 3–12)
Neutro Abs: 2.9 10*3/uL (ref 1.7–7.7)

## 2012-05-21 SURGERY — IRRIGATION AND DEBRIDEMENT EXTREMITY
Anesthesia: General | Site: Arm Upper | Laterality: Right | Wound class: Contaminated

## 2012-05-21 MED ORDER — LACTATED RINGERS IV SOLN
INTRAVENOUS | Status: DC | PRN
  Administered 2012-05-21: 18:00:00 via INTRAVENOUS

## 2012-05-21 MED ORDER — CEFAZOLIN SODIUM-DEXTROSE 2-3 GM-% IV SOLR
2.0000 g | Freq: Once | INTRAVENOUS | Status: AC
  Administered 2012-05-21: 2 g via INTRAVENOUS
  Filled 2012-05-21: qty 50

## 2012-05-21 MED ORDER — PROPOFOL 10 MG/ML IV EMUL
INTRAVENOUS | Status: DC | PRN
  Administered 2012-05-21: 200 mg via INTRAVENOUS

## 2012-05-21 MED ORDER — SUCCINYLCHOLINE CHLORIDE 20 MG/ML IJ SOLN
INTRAMUSCULAR | Status: DC | PRN
  Administered 2012-05-21: 140 mg via INTRAVENOUS

## 2012-05-21 MED ORDER — CEPHALEXIN 500 MG PO CAPS: 500.0000 mg | ORAL_CAPSULE | Freq: Four times a day (QID) | ORAL | Status: AC

## 2012-05-21 MED ORDER — SODIUM CHLORIDE 0.9 % IR SOLN
Status: DC | PRN
  Administered 2012-05-21: 1000 mL

## 2012-05-21 MED ORDER — MEPERIDINE HCL 25 MG/ML IJ SOLN
6.2500 mg | INTRAMUSCULAR | Status: DC | PRN
  Administered 2012-05-21: 12.5 mg via INTRAVENOUS

## 2012-05-21 MED ORDER — HYDROMORPHONE HCL PF 1 MG/ML IJ SOLN
1.0000 mg | Freq: Once | INTRAMUSCULAR | Status: AC
  Administered 2012-05-21: 1 mg via INTRAVENOUS
  Filled 2012-05-21: qty 1

## 2012-05-21 MED ORDER — PROMETHAZINE HCL 25 MG/ML IJ SOLN: 6.2500 mg | INTRAMUSCULAR | Status: DC | PRN

## 2012-05-21 MED ORDER — HYDROMORPHONE HCL PF 1 MG/ML IJ SOLN: 0.2500 mg | INTRAMUSCULAR | Status: DC | PRN

## 2012-05-21 MED ORDER — FENTANYL CITRATE 0.05 MG/ML IJ SOLN
INTRAMUSCULAR | Status: DC | PRN
  Administered 2012-05-21: 100 ug via INTRAVENOUS
  Administered 2012-05-21: 50 ug via INTRAVENOUS
  Administered 2012-05-21: 100 ug via INTRAVENOUS

## 2012-05-21 MED ORDER — SODIUM CHLORIDE 0.9 % IR SOLN
Status: DC | PRN
  Administered 2012-05-21: 19:00:00

## 2012-05-21 MED ORDER — ONDANSETRON HCL 4 MG/2ML IJ SOLN
INTRAMUSCULAR | Status: DC | PRN
  Administered 2012-05-21: 4 mg via INTRAVENOUS

## 2012-05-21 MED ORDER — SODIUM CHLORIDE 0.9 % IV SOLN
Freq: Once | INTRAVENOUS | Status: AC
  Administered 2012-05-21: 17:00:00 via INTRAVENOUS

## 2012-05-21 MED ORDER — KETOROLAC TROMETHAMINE 30 MG/ML IJ SOLN: 15.0000 mg | Freq: Once | INTRAMUSCULAR | Status: DC | PRN

## 2012-05-21 MED ORDER — OXYCODONE-ACETAMINOPHEN 7.5-325 MG PO TABS: 1.0000 | ORAL_TABLET | ORAL | Status: AC | PRN

## 2012-05-21 MED ORDER — LIDOCAINE HCL (CARDIAC) 20 MG/ML IV SOLN
INTRAVENOUS | Status: DC | PRN
  Administered 2012-05-21: 100 mg via INTRAVENOUS

## 2012-05-21 MED ORDER — MIDAZOLAM HCL 5 MG/5ML IJ SOLN
INTRAMUSCULAR | Status: DC | PRN
  Administered 2012-05-21: 2 mg via INTRAVENOUS

## 2012-05-21 SURGICAL SUPPLY — 50 items
BANDAGE ELASTIC 4 VELCRO ST LF (GAUZE/BANDAGES/DRESSINGS) ×2 IMPLANT
BANDAGE ELASTIC 6 VELCRO ST LF (GAUZE/BANDAGES/DRESSINGS) ×1 IMPLANT
BANDAGE GAUZE ELAST BULKY 4 IN (GAUZE/BANDAGES/DRESSINGS) ×2 IMPLANT
BNDG COHESIVE 4X5 TAN STRL (GAUZE/BANDAGES/DRESSINGS) ×2 IMPLANT
CLOTH BEACON ORANGE TIMEOUT ST (SAFETY) ×2 IMPLANT
CORDS BIPOLAR (ELECTRODE) ×1 IMPLANT
CUFF TOURNIQUET SINGLE 18IN (TOURNIQUET CUFF) ×2 IMPLANT
CUFF TOURNIQUET SINGLE 24IN (TOURNIQUET CUFF) IMPLANT
CUFF TOURNIQUET SINGLE 34IN LL (TOURNIQUET CUFF) IMPLANT
CUFF TOURNIQUET SINGLE 44IN (TOURNIQUET CUFF) IMPLANT
DRSG PAD ABDOMINAL 8X10 ST (GAUZE/BANDAGES/DRESSINGS) ×4 IMPLANT
ELECT REM PT RETURN 9FT ADLT (ELECTROSURGICAL) ×2
ELECTRODE REM PT RTRN 9FT ADLT (ELECTROSURGICAL) IMPLANT
FACESHIELD LNG OPTICON STERILE (SAFETY) ×3 IMPLANT
GAUZE XEROFORM 5X9 LF (GAUZE/BANDAGES/DRESSINGS) ×1 IMPLANT
GLOVE BIOGEL PI IND STRL 7.5 (GLOVE) IMPLANT
GLOVE BIOGEL PI INDICATOR 7.5 (GLOVE) ×1
GLOVE SURG SS PI 8.0 STRL IVOR (GLOVE) ×4 IMPLANT
GOWN EXTRA PROTECTION XL (GOWNS) ×1 IMPLANT
GOWN STRL NON-REIN LRG LVL3 (GOWN DISPOSABLE) ×5 IMPLANT
HANDPIECE INTERPULSE COAX TIP (DISPOSABLE)
KIT BASIN OR (CUSTOM PROCEDURE TRAY) ×2 IMPLANT
KIT ROOM TURNOVER OR (KITS) ×2 IMPLANT
MANIFOLD NEPTUNE II (INSTRUMENTS) ×1 IMPLANT
NS IRRIG 1000ML POUR BTL (IV SOLUTION) ×2 IMPLANT
PACK ORTHO EXTREMITY (CUSTOM PROCEDURE TRAY) ×2 IMPLANT
PAD ARMBOARD 7.5X6 YLW CONV (MISCELLANEOUS) ×4 IMPLANT
PAD CAST 4YDX4 CTTN HI CHSV (CAST SUPPLIES) IMPLANT
PADDING CAST COTTON 4X4 STRL (CAST SUPPLIES) ×2
PADDING CAST COTTON 6X4 STRL (CAST SUPPLIES) ×1 IMPLANT
SET HNDPC FAN SPRY TIP SCT (DISPOSABLE) IMPLANT
SLING ARM FOAM STRAP LRG (SOFTGOODS) ×1 IMPLANT
SPLINT FIBERGLASS 4X30 (CAST SUPPLIES) ×1 IMPLANT
SPONGE GAUZE 4X4 12PLY (GAUZE/BANDAGES/DRESSINGS) ×2 IMPLANT
SPONGE LAP 18X18 X RAY DECT (DISPOSABLE) ×3 IMPLANT
SPONGE LAP 4X18 X RAY DECT (DISPOSABLE) ×1 IMPLANT
STAPLER VISISTAT (STAPLE) ×1 IMPLANT
STAPLER VISISTAT 35W (STAPLE) ×2 IMPLANT
STOCKINETTE IMPERVIOUS 9X36 MD (GAUZE/BANDAGES/DRESSINGS) ×2 IMPLANT
SUT ETHILON 3 0 PS 1 (SUTURE) ×1 IMPLANT
SUT ETHILON 4 0 PS 2 18 (SUTURE) ×1 IMPLANT
SUT VIC AB 2-0 CT1 27 (SUTURE) ×6
SUT VIC AB 2-0 CT1 TAPERPNT 27 (SUTURE) IMPLANT
TOWEL OR 17X24 6PK STRL BLUE (TOWEL DISPOSABLE) ×2 IMPLANT
TOWEL OR 17X26 10 PK STRL BLUE (TOWEL DISPOSABLE) ×3 IMPLANT
TUBE ANAEROBIC SPECIMEN COL (MISCELLANEOUS) IMPLANT
TUBE CONNECTING 12X1/4 (SUCTIONS) ×2 IMPLANT
UNDERPAD 30X30 INCONTINENT (UNDERPADS AND DIAPERS) ×2 IMPLANT
WATER STERILE IRR 1000ML POUR (IV SOLUTION) ×1 IMPLANT
YANKAUER SUCT BULB TIP NO VENT (SUCTIONS) ×2 IMPLANT
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
## 2012-05-21 NOTE — Anesthesia Postprocedure Evaluation (Signed)
  Anesthesia Post-op Note  Patient: Curtis Hodge  Procedure(s) Performed: Procedure(s) (LRB): IRRIGATION AND DEBRIDEMENT EXTREMITY (Right)  Patient Location: PACU  Anesthesia Type: General  Level of Consciousness: awake and sedated  Airway and Oxygen Therapy: Patient Spontanous Breathing  Post-op Pain: mild  Post-op Assessment: Post-op Vital signs reviewed  Post-op Vital Signs: stable  Complications: No apparent anesthesia complications

## 2012-05-21 NOTE — ED Notes (Signed)
Pt returned from xray and put back on monitor.
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
## 2012-05-21 NOTE — Anesthesia Preprocedure Evaluation (Addendum)
Anesthesia Evaluation  Patient identified by MRN, date of birth, ID band Patient awake    Reviewed: Allergy & Precautions, H&P , NPO status   Airway Mallampati: I TM Distance: >3 FB Neck ROM: Full    Dental  (+) Teeth Intact and Dental Advisory Given   Pulmonary  breath sounds clear to auscultation        Cardiovascular Rhythm:Regular Rate:Normal     Neuro/Psych    GI/Hepatic   Endo/Other    Renal/GU      Musculoskeletal   Abdominal   Peds  Hematology   Anesthesia Other Findings   Reproductive/Obstetrics                          Anesthesia Physical Anesthesia Plan  ASA: II and Emergent  Anesthesia Plan: General   Post-op Pain Management:    Induction: Intravenous  Airway Management Planned: Oral ETT  Additional Equipment:   Intra-op Plan:   Post-operative Plan: Extubation in OR  Informed Consent:   Plan Discussed with: CRNA and Surgeon  Anesthesia Plan Comments:         Anesthesia Quick Evaluation

## 2012-05-21 NOTE — Brief Op Note (Signed)
05/21/2012  7:17 PM  PATIENT:  Curtis Hodge  46 y.o. male  PRE-OPERATIVE DIAGNOSIS:  Right Arm Laceration biceps laceration  POST-OPERATIVE DIAGNOSIS:  Right Arm Laceration same  PROCEDURE:  Procedure(s) (LRB): IRRIGATION AND DEBRIDEMENT EXTREMITY (Right) repair of biceps  SURGEON:  Surgeon(s) and Role:    * [name], MD - Primary  PHYSICIAN ASSISTANT:   ASSISTANTS: none   ANESTHESIA:   general  EBL:     BLOOD ADMINISTERED:none  DRAINS: none   LOCAL MEDICATIONS USED:  NONE  SPECIMEN:  No Specimen  DISPOSITION OF SPECIMEN:  N/A  COUNTS:  YES  TOURNIQUET:  * No tourniquets in log *  DICTATION: .Other Dictation: Dictation Number  [id number]  PLAN OF CARE: Discharge to home after PACU  PATIENT DISPOSITION:  PACU - hemodynamically stable.   Delay start of Pharmacological VTE agent (>24hrs) due to surgical blood loss or risk of bleeding: yes

## 2012-05-21 NOTE — Preoperative (Signed)
Beta Blockers   Reason not to administer Beta Blockers:Not Applicable 

## 2012-05-21 NOTE — Anesthesia Procedure Notes (Signed)
Procedure Name: Intubation Date/Time: 05/21/2012 5:59 PM Performed by: Jefm Miles E Pre-anesthesia Checklist: Patient identified, Timeout performed, Emergency Drugs available, Suction available and Patient being monitored Patient Re-evaluated:Patient Re-evaluated prior to inductionOxygen Delivery Method: Circle system utilized Preoxygenation: Pre-oxygenation with 100% oxygen Intubation Type: IV induction, Rapid sequence and Cricoid Pressure applied Laryngoscope Size: Mac and 4 Grade View: Grade I Tube type: Oral Number of attempts: 1 Airway Equipment and Method: Stylet Placement Confirmation: ETT inserted through vocal cords under direct vision,  breath sounds checked- equal and bilateral and positive ETCO2 Secured at: 23 cm Tube secured with: Tape Dental Injury: Teeth and Oropharynx as per pre-operative assessment
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
## 2012-05-21 NOTE — Transfer of Care (Signed)
Immediate Anesthesia Transfer of Care Note  Patient: Curtis Hodge  Procedure(s) Performed: Procedure(s) (LRB): IRRIGATION AND DEBRIDEMENT EXTREMITY (Right)  Patient Location: PACU  Anesthesia Type: General  Level of Consciousness: patient cooperative, lethargic and responds to stimulation  Airway & Oxygen Therapy: Patient Spontanous Breathing and Patient connected to face mask oxygen  Post-op Assessment: Report given to PACU RN  Post vital signs: Reviewed and stable  Complications: No apparent anesthesia complications

## 2012-05-22 ENCOUNTER — Encounter (HOSPITAL_COMMUNITY): Payer: Self-pay | Admitting: Specialist

## 2012-05-22 MED FILL — Hydromorphone HCl Inj 1 MG/ML: INTRAMUSCULAR | Qty: 1 | Status: AC
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
## 2012-05-25 ENCOUNTER — Emergency Department (HOSPITAL_COMMUNITY)
Admission: EM | Admit: 2012-05-25 | Discharge: 2012-05-25 | Disposition: A | Discharge status: Home / Self Care | Payer: Medicaid Other | Attending: Emergency Medicine | Admitting: Emergency Medicine

## 2012-05-25 ENCOUNTER — Encounter (HOSPITAL_COMMUNITY): Payer: Self-pay | Admitting: Emergency Medicine

## 2012-05-25 DIAGNOSIS — Z21 Asymptomatic human immunodeficiency virus [HIV] infection status: Secondary | ICD-10-CM | POA: Insufficient documentation

## 2012-05-25 DIAGNOSIS — Z8619 Personal history of other infectious and parasitic diseases: Secondary | ICD-10-CM | POA: Insufficient documentation

## 2012-05-25 DIAGNOSIS — F17200 Nicotine dependence, unspecified, uncomplicated: Secondary | ICD-10-CM | POA: Insufficient documentation

## 2012-05-25 DIAGNOSIS — Z4802 Encounter for removal of sutures: Secondary | ICD-10-CM | POA: Insufficient documentation

## 2012-05-25 DIAGNOSIS — IMO0001 Reserved for inherently not codable concepts without codable children: Secondary | ICD-10-CM
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
## 2012-05-25 NOTE — ED Notes (Signed)
Sutures removed from below rt eye. Steri strips applied.

## 2012-05-25 NOTE — ED Notes (Signed)
Pt rt arm dressed and sling reapplied.
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
## 2012-06-16 ENCOUNTER — Other Ambulatory Visit: Payer: Self-pay

## 2012-06-16 ENCOUNTER — Other Ambulatory Visit (HOSPITAL_COMMUNITY)
Admission: RE | Admit: 2012-06-16 | Discharge: 2012-06-16 | Disposition: A | Discharge status: Home / Self Care | Payer: Medicaid Other | Source: Ambulatory Visit | Attending: Internal Medicine | Admitting: Internal Medicine

## 2012-06-16 DIAGNOSIS — B20 Human immunodeficiency virus [HIV] disease: Secondary | ICD-10-CM

## 2012-06-16 DIAGNOSIS — Z113 Encounter for screening for infections with a predominantly sexual mode of transmission: Secondary | ICD-10-CM | POA: Insufficient documentation

## 2012-06-16 LAB — CBC WITH DIFFERENTIAL/PLATELET
Basophils Absolute: 0 10*3/uL (ref 0.0–0.1)
Lymphocytes Relative: 40 % (ref 12–46)
Lymphs Abs: 1.2 10*3/uL (ref 0.7–4.0)
Neutro Abs: 1.5 10*3/uL — ABNORMAL LOW (ref 1.7–7.7)
Neutrophils Relative %: 49 % (ref 43–77)
Platelets: 158 10*3/uL (ref 150–400)
RBC: 4.12 MIL/uL — ABNORMAL LOW (ref 4.22–5.81)
WBC: 3 10*3/uL — ABNORMAL LOW (ref 4.0–10.5)

## 2012-06-16 LAB — COMPLETE METABOLIC PANEL WITH GFR
AST: 21 U/L (ref 0–37)
BUN: 11 mg/dL (ref 6–23)
Calcium: 9.3 mg/dL (ref 8.4–10.5)
Chloride: 106 mEq/L (ref 96–112)
Creat: 1.1 mg/dL (ref 0.50–1.35)
GFR, Est African American: >89 mL/min
GFR, Est Non African American: 81 mL/min
Glucose, Bld: 91 mg/dL (ref 70–99)

## 2012-06-16 LAB — RPR

## 2012-06-18 ENCOUNTER — Other Ambulatory Visit: Payer: Self-pay

## 2012-06-18 LAB — LIPID PANEL
HDL: 39 mg/dL — ABNORMAL LOW (ref >39)
Triglycerides: 75 mg/dL (ref <150)

## 2012-06-18 LAB — HIV-1 RNA QUANT-NO REFLEX-BLD: HIV 1 RNA Quant: 14261 copies/mL — ABNORMAL HIGH (ref <20)

## 2012-07-22 ENCOUNTER — Encounter: Payer: Self-pay | Admitting: Infectious Disease

## 2012-07-22 ENCOUNTER — Ambulatory Visit (INDEPENDENT_AMBULATORY_CARE_PROVIDER_SITE_OTHER): Payer: Self-pay | Admitting: Infectious Disease

## 2012-07-22 VITALS — BP 127/81 | HR 86 | Temp 98.7°F | Ht 69.0 in | Wt 151.2 lb

## 2012-07-22 DIAGNOSIS — R634 Abnormal weight loss: Secondary | ICD-10-CM

## 2012-07-22 DIAGNOSIS — S0181XA Laceration without foreign body of other part of head, initial encounter: Secondary | ICD-10-CM | POA: Insufficient documentation

## 2012-07-22 DIAGNOSIS — S0180XA Unspecified open wound of other part of head, initial encounter: Secondary | ICD-10-CM

## 2012-07-22 DIAGNOSIS — F17200 Nicotine dependence, unspecified, uncomplicated: Secondary | ICD-10-CM

## 2012-07-22 DIAGNOSIS — B20 Human immunodeficiency virus [HIV] disease: Secondary | ICD-10-CM

## 2012-07-22 MED ORDER — ENSURE PO LIQD: 237.0000 mL | Freq: Two times a day (BID) | ORAL | Status: DC

## 2012-07-22 NOTE — Assessment & Plan Note (Addendum)
Not clear to me why he has lost weight if this is true. He certainly on exam is a very muscular individual (he was former boxer) and that this does not appear to be obvious wasting.

## 2012-07-22 NOTE — Assessment & Plan Note (Signed)
Needs to get back on his antiretrovirals which have been prescribed. He is plugged into a  AIDS drug assistance program

## 2012-07-22 NOTE — Assessment & Plan Note (Signed)
This appears to have healed well fortunately his vision was spared

## 2012-07-22 NOTE — Assessment & Plan Note (Signed)
He'll work on smoking cessation
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
## 2012-07-24 LAB — HIV-1 RNA QUANT-NO REFLEX-BLD
HIV 1 RNA Quant: 14316 copies/mL — ABNORMAL HIGH (ref <20)
HIV-1 RNA Quant, Log: 4.16 {Log} — ABNORMAL HIGH (ref <1.30)

## 2012-07-24 LAB — T-HELPER CELL (CD4) - (RCID CLINIC ONLY): CD4 T Cell Abs: 350 uL — ABNORMAL LOW (ref 400–2700)

## 2012-08-17 ENCOUNTER — Other Ambulatory Visit: Payer: Self-pay | Admitting: Licensed Clinical Social Worker

## 2012-08-17 DIAGNOSIS — S0181XA Laceration without foreign body of other part of head, initial encounter: Secondary | ICD-10-CM

## 2012-08-17 DIAGNOSIS — R634 Abnormal weight loss: Secondary | ICD-10-CM

## 2012-08-17 DIAGNOSIS — B20 Human immunodeficiency virus [HIV] disease: Secondary | ICD-10-CM

## 2012-08-17 DIAGNOSIS — F17200 Nicotine dependence, unspecified, uncomplicated: Secondary | ICD-10-CM

## 2012-08-17 MED ORDER — EMTRICITABINE-TENOFOVIR DF 200-300 MG PO TABS: 1.0000 | ORAL_TABLET | Freq: Every day | ORAL | Status: DC

## 2012-08-17 MED ORDER — DARUNAVIR ETHANOLATE 800 MG PO TABS: 800.0000 mg | ORAL_TABLET | Freq: Every day | ORAL | Status: DC

## 2012-08-17 MED ORDER — RITONAVIR 100 MG PO TABS: 100.0000 mg | ORAL_TABLET | Freq: Every day | ORAL | Status: DC

## 2012-08-20 ENCOUNTER — Ambulatory Visit: Payer: Self-pay

## 2012-08-20 ENCOUNTER — Other Ambulatory Visit (INDEPENDENT_AMBULATORY_CARE_PROVIDER_SITE_OTHER): Payer: Self-pay

## 2012-08-20 DIAGNOSIS — B20 Human immunodeficiency virus [HIV] disease: Secondary | ICD-10-CM

## 2012-08-20 DIAGNOSIS — S0181XA Laceration without foreign body of other part of head, initial encounter: Secondary | ICD-10-CM

## 2012-08-20 DIAGNOSIS — R634 Abnormal weight loss: Secondary | ICD-10-CM

## 2012-08-20 DIAGNOSIS — F17200 Nicotine dependence, unspecified, uncomplicated: Secondary | ICD-10-CM

## 2012-08-20 LAB — CBC WITH DIFFERENTIAL/PLATELET
Eosinophils Absolute: 0 10*3/uL (ref 0.0–0.7)
Hemoglobin: 14.4 g/dL (ref 13.0–17.0)
Lymphocytes Relative: 40 % (ref 12–46)
Lymphs Abs: 1.3 10*3/uL (ref 0.7–4.0)
MCH: 32.7 pg (ref 26.0–34.0)
Monocytes Relative: 6 % (ref 3–12)
Neutrophils Relative %: 53 % (ref 43–77)
Platelets: 157 10*3/uL (ref 150–400)
RBC: 4.4 MIL/uL (ref 4.22–5.81)
WBC: 3.2 10*3/uL — ABNORMAL LOW (ref 4.0–10.5)

## 2012-08-20 LAB — COMPLETE METABOLIC PANEL WITH GFR
ALT: 20 U/L (ref 0–53)
AST: 29 U/L (ref 0–37)
BUN: 13 mg/dL (ref 6–23)
CO2: 25 mEq/L (ref 19–32)
Calcium: 9.2 mg/dL (ref 8.4–10.5)
Creat: 1.23 mg/dL (ref 0.50–1.35)
GFR, Est African American: 81 mL/min
Total Bilirubin: 0.4 mg/dL (ref 0.3–1.2)

## 2012-08-21 LAB — T-HELPER CELL (CD4) - (RCID CLINIC ONLY)
CD4 % Helper T Cell: 25 % — ABNORMAL LOW (ref 33–55)
CD4 T Cell Abs: 340 uL — ABNORMAL LOW (ref 400–2700)

## 2012-08-21 LAB — HIV-1 RNA QUANT-NO REFLEX-BLD
HIV 1 RNA Quant: 135 copies/mL — ABNORMAL HIGH (ref <20)
HIV-1 RNA Quant, Log: 2.13 {Log} — ABNORMAL HIGH (ref <1.30)

## 2012-08-31 ENCOUNTER — Other Ambulatory Visit: Payer: Self-pay | Admitting: *Deleted

## 2012-08-31 DIAGNOSIS — B20 Human immunodeficiency virus [HIV] disease: Secondary | ICD-10-CM

## 2012-08-31 MED ORDER — EMTRICITABINE-TENOFOVIR DF 200-300 MG PO TABS: 1.0000 | ORAL_TABLET | Freq: Every day | ORAL | Status: DC

## 2012-08-31 MED ORDER — DARUNAVIR ETHANOLATE 800 MG PO TABS: 800.0000 mg | ORAL_TABLET | Freq: Every day | ORAL | Status: DC

## 2012-08-31 MED ORDER — RITONAVIR 100 MG PO TABS: 100.0000 mg | ORAL_TABLET | Freq: Every day | ORAL | Status: DC

## 2012-08-31 NOTE — Telephone Encounter (Signed)
Patient ADAP application was approved.

## 2012-09-02 ENCOUNTER — Ambulatory Visit (INDEPENDENT_AMBULATORY_CARE_PROVIDER_SITE_OTHER): Payer: Self-pay | Admitting: Infectious Disease

## 2012-09-02 ENCOUNTER — Encounter: Payer: Self-pay | Admitting: Infectious Disease

## 2012-09-02 ENCOUNTER — Other Ambulatory Visit: Payer: Self-pay | Admitting: *Deleted

## 2012-09-02 VITALS — BP 131/81 | HR 64 | Temp 98.2°F | Ht 69.0 in | Wt 159.5 lb

## 2012-09-02 DIAGNOSIS — S0181XA Laceration without foreign body of other part of head, initial encounter: Secondary | ICD-10-CM

## 2012-09-02 DIAGNOSIS — R634 Abnormal weight loss: Secondary | ICD-10-CM

## 2012-09-02 DIAGNOSIS — Z23 Encounter for immunization: Secondary | ICD-10-CM

## 2012-09-02 DIAGNOSIS — F17200 Nicotine dependence, unspecified, uncomplicated: Secondary | ICD-10-CM

## 2012-09-02 DIAGNOSIS — B20 Human immunodeficiency virus [HIV] disease: Secondary | ICD-10-CM

## 2012-09-02 MED ORDER — ENSURE PO LIQD: 237.0000 mL | Freq: Two times a day (BID) | ORAL | Status: AC

## 2012-09-02 NOTE — Progress Notes (Signed)
  Subjective:    Patient ID: Curtis Hodge, male    DOB: [birth year], 46 y.o.   MRN: [id number]  HPI  46 year old [place] American man with HIV now much better controlled on prezista, norvir and truvada. His CD4 is staying above. 300. He has cut smoking in half. He is gaining weight. He still requests ensure ulcer is curious about his testosterone levels. He has had some loose stools with antiretroviral is tolerating these well overall. He claims to be using condoms with his girlfriend who would like to bring in for HIV testing. He is in great spirits after receiving news of his recent viral load and CD4 count. I spent greater than 45 minutes with the patient including greater than 50% of time in face to face counsel of the patient and in coordination of their care.   Review of Systems  Constitutional: Negative for fever, chills, diaphoresis, activity change, appetite change, fatigue and unexpected weight change.  HENT: Negative for congestion, sore throat, rhinorrhea, sneezing, trouble swallowing and sinus pressure.   Eyes: Negative for photophobia and visual disturbance.  Respiratory: Negative for cough, chest tightness, shortness of breath, wheezing and stridor.   Cardiovascular: Negative for chest pain, palpitations and leg swelling.  Gastrointestinal: Negative for nausea, vomiting, abdominal pain, diarrhea, constipation, blood in stool, abdominal distention and anal bleeding.  Genitourinary: Negative for dysuria, hematuria, flank pain and difficulty urinating.  Musculoskeletal: Negative for myalgias, back pain, joint swelling, arthralgias and gait problem.  Skin: Negative for color change, pallor, rash and wound.  Neurological: Negative for dizziness, tremors, weakness and light-headedness.  Hematological: Negative for adenopathy. Does not bruise/bleed easily.  Psychiatric/Behavioral: Negative for behavioral problems, confusion, disturbed wake/sleep cycle, dysphoric mood, decreased  concentration and agitation.       Objective:   Physical Exam  Constitutional: He is oriented to person, place, and time. He appears well-developed and well-nourished. No distress.  HENT:  Head: Normocephalic and atraumatic.  Mouth/Throat: Oropharynx is clear and moist. No oropharyngeal exudate.  Eyes: Conjunctivae normal and EOM are normal. Pupils are equal, round, and reactive to light. No scleral icterus.  Neck: Normal range of motion. Neck supple. No JVD present.  Cardiovascular: Normal rate, regular rhythm and normal heart sounds.  Exam reveals no gallop and no friction rub.   No murmur heard. Pulmonary/Chest: Effort normal and breath sounds normal. No respiratory distress. He has no wheezes. He has no rales. He exhibits no tenderness.  Abdominal: He exhibits no distension and no mass. There is no tenderness. There is no rebound and no guarding.  Musculoskeletal: He exhibits no edema and no tenderness.  Lymphadenopathy:    He has no cervical adenopathy.  Neurological: He is alert and oriented to person, place, and time. He has normal reflexes. He exhibits normal muscle tone. Coordination normal.  Skin: Skin is warm and dry. He is not diaphoretic. No erythema. No pallor.  Psychiatric: He has a normal mood and affect. His behavior is normal. Judgment and thought content normal.          Assessment & Plan:  #1: HIV: Approaching perfect virological suppression. Check viral load today bring back to clinic in 2 months time for repeat labs.  #2 weight loss: We will check serum testosterone and will give him prescription for ensure.  #3 smoking he had made great strides and reduction of his tobacco use.  #4 preventative health: We've given an influenza vaccination  #5 lacerations these are healing.

## 2012-09-03 LAB — HIV-1 RNA QUANT-NO REFLEX-BLD
HIV 1 RNA Quant: 64 copies/mL — ABNORMAL HIGH (ref <20)
HIV-1 RNA Quant, Log: 1.81 {Log} — ABNORMAL HIGH (ref <1.30)

## 2012-09-03 LAB — TESTOSTERONE: Testosterone: 271.17 ng/dL — ABNORMAL LOW (ref 300–890)

## 2012-09-06 ENCOUNTER — Telehealth: Payer: Self-pay | Admitting: Infectious Disease

## 2012-09-06 MED ORDER — TESTOSTERONE CYPIONATE 200 MG/ML IM SOLN: 100.0000 mg | INTRAMUSCULAR | Status: DC

## 2012-09-06 NOTE — Telephone Encounter (Signed)
Serum testosterone was actuall slightly low.   Will check start him on low dose tesosterone replacment.  Tamika can we let him know so he can pick up his script?

## 2012-09-14 ENCOUNTER — Telehealth: Payer: Self-pay | Admitting: Infectious Disease

## 2012-09-14 NOTE — Telephone Encounter (Signed)
Called and left V/M for Mr.  Ronk to return my call concerning Patient Assistance

## 2012-09-22 ENCOUNTER — Telehealth: Payer: Self-pay | Admitting: Infectious Disease

## 2012-09-22 NOTE — Telephone Encounter (Signed)
Called Mr. Maull and sat up an appointment for him on 11-11 between 1 & 2pm

## 2012-11-04 ENCOUNTER — Other Ambulatory Visit: Payer: Self-pay

## 2012-11-19 ENCOUNTER — Ambulatory Visit: Payer: Self-pay | Admitting: Infectious Disease

## 2012-11-20 ENCOUNTER — Telehealth: Payer: Self-pay | Admitting: *Deleted

## 2012-11-20 NOTE — Telephone Encounter (Signed)
Called patient to notify him of a missed appointment on 11/19/12. Patient on his way out at this time, but stated he will call back to reschedule both lab and office visit. Andree Coss, RN

## 2013-01-04 ENCOUNTER — Telehealth: Payer: Self-pay | Admitting: *Deleted

## 2013-01-04 NOTE — Telephone Encounter (Signed)
Called patient to schedule him for a follow up visit and he wanted to come in asap. He advised he now has transportation. Gave him 01/05/13 at 315 with Dr Daiva Eves and advised him to be ready to have labs also.

## 2013-01-05 ENCOUNTER — Encounter: Payer: Self-pay | Admitting: Infectious Disease

## 2013-01-05 ENCOUNTER — Ambulatory Visit (INDEPENDENT_AMBULATORY_CARE_PROVIDER_SITE_OTHER): Payer: Self-pay | Admitting: Infectious Disease

## 2013-01-05 VITALS — BP 116/73 | HR 93 | Temp 98.1°F | Ht 70.0 in | Wt 158.5 lb

## 2013-01-05 DIAGNOSIS — M545 Low back pain: Secondary | ICD-10-CM

## 2013-01-05 DIAGNOSIS — B20 Human immunodeficiency virus [HIV] disease: Secondary | ICD-10-CM

## 2013-01-05 DIAGNOSIS — H538 Other visual disturbances: Secondary | ICD-10-CM

## 2013-01-05 DIAGNOSIS — H5711 Ocular pain, right eye: Secondary | ICD-10-CM | POA: Insufficient documentation

## 2013-01-05 DIAGNOSIS — E291 Testicular hypofunction: Secondary | ICD-10-CM

## 2013-01-05 MED ORDER — TESTOSTERONE CYPIONATE 200 MG/ML IM SOLN: 100.0000 mg | INTRAMUSCULAR | Status: DC

## 2013-01-05 MED ORDER — OXYCODONE-ACETAMINOPHEN 5-325 MG PO TABS: 1.0000 | ORAL_TABLET | Freq: Every evening | ORAL | Status: DC | PRN

## 2013-01-05 NOTE — Progress Notes (Signed)
Subjective:    Patient ID: Curtis Hodge, male    DOB: [birth year], 47 y.o.   MRN: [id number]  HPI  47 year old [place] American man with HIV now much better controlled on prezista, norvir and truvada who was found to have low testosterone levels might checked them last week seen in clinic. I attempted to prescribe him testosterone injectable but the AIDS drug assistance program would not cover this. He was not seen for patient assistance and therefore is not yet been started on this medication.  Patient doing relatively well and shows me his bottle of antivirals include Prezista Norvir and Truvada. He is been working with [employer] on heavy schedule recently it has been particularly he was due to the weather.  He complains of multiple aches and pains including pain in his lower back as well as a "sciatic" type pain in his left thigh going to his left leg with sounds a bit more like a muscle cramp. He claims to have lumbar disc disease as well. He states that his pain has not responded well to nonsteroidals and did request a trial of prescription narcotics which I was willing to do at this point in time.  He is also complaining of blurry vision bilaterally and which is been on for a year. We're trying to establish his care with an ophthalmologist.  I spent greater than 45 minutes with the patient including greater than 50% of time in face to face counsel of the patient and in coordination of their care.    Review of Systems  Constitutional: Negative for fever, chills, diaphoresis, activity change, appetite change, fatigue and unexpected weight change.  HENT: Negative for congestion, sore throat, rhinorrhea, sneezing, trouble swallowing and sinus pressure.   Eyes: Positive for visual disturbance. Negative for photophobia.  Respiratory: Negative for cough, chest tightness, shortness of breath, wheezing and stridor.   Cardiovascular: Negative for chest pain, palpitations and leg swelling.   Gastrointestinal: Negative for nausea, vomiting, abdominal pain, diarrhea, constipation, blood in stool, abdominal distention and anal bleeding.  Genitourinary: Negative for dysuria, hematuria, flank pain and difficulty urinating.  Musculoskeletal: Positive for myalgias, back pain and arthralgias. Negative for joint swelling and gait problem.  Skin: Negative for color change, pallor, rash and wound.  Neurological: Negative for dizziness, tremors, weakness and light-headedness.  Hematological: Negative for adenopathy. Does not bruise/bleed easily.  Psychiatric/Behavioral: Negative for behavioral problems, confusion, sleep disturbance, dysphoric mood, decreased concentration and agitation.       Objective:   Physical Exam  Constitutional: He is oriented to person, place, and time. He appears well-developed and well-nourished. No distress.  HENT:  Head: Normocephalic and atraumatic.  Mouth/Throat: Oropharynx is clear and moist. No oropharyngeal exudate.  Eyes: Conjunctivae and EOM are normal. Pupils are equal, round, and reactive to light. No scleral icterus.  Neck: Normal range of motion. Neck supple. No JVD present.  Cardiovascular: Normal rate, regular rhythm and normal heart sounds.  Exam reveals no gallop and no friction rub.   No murmur heard. Pulmonary/Chest: Effort normal and breath sounds normal. No respiratory distress. He has no wheezes. He has no rales. He exhibits no tenderness.  Abdominal: He exhibits no distension and no mass. There is no tenderness. There is no rebound and no guarding.  Musculoskeletal: He exhibits no edema and no tenderness.  No obvious bony deformities.  Lymphadenopathy:    He has no cervical adenopathy.  Neurological: He is alert and oriented to person, place, and time. He has normal  reflexes. He exhibits normal muscle tone. Coordination normal.  Skin: Skin is warm and dry. He is not diaphoretic. No erythema. No pallor.  Psychiatric: He has a normal mood  and affect. His behavior is normal. Judgment and thought content normal.          Assessment & Plan:  HIV check viral load and CD4 count continue Prezista Norvir Truvada.  Low testosterone: We'll have him meet with patient assistance to apply for assistance to obtain testosterone replacement.  Blurry vision: Is been chronic in nature unlikely to be infectious. 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 Will refer to optho  Back pain, leg pain: will give him #30 percocet and will need pain contract if this becomes long term need. Will consider MRI spine in future but no urgent Neuro signs

## 2013-01-06 LAB — CBC WITH DIFFERENTIAL/PLATELET
Basophils Absolute: 0 10*3/uL (ref 0.0–0.1)
Basophils Relative: 0 % (ref 0–1)
HCT: 40.9 % (ref 39.0–52.0)
Hemoglobin: 14.6 g/dL (ref 13.0–17.0)
Lymphocytes Relative: 47 % — ABNORMAL HIGH (ref 12–46)
MCHC: 35.7 g/dL (ref 30.0–36.0)
Monocytes Absolute: 0.3 10*3/uL (ref 0.1–1.0)
Monocytes Relative: 9 % (ref 3–12)
Neutro Abs: 1.3 10*3/uL — ABNORMAL LOW (ref 1.7–7.7)
Neutrophils Relative %: 44 % (ref 43–77)
WBC: 3.1 10*3/uL — ABNORMAL LOW (ref 4.0–10.5)

## 2013-01-06 LAB — COMPLETE METABOLIC PANEL WITH GFR
Alkaline Phosphatase: 95 U/L (ref 39–117)
BUN: 11 mg/dL (ref 6–23)
CO2: 28 mEq/L (ref 19–32)
Creat: 1.16 mg/dL (ref 0.50–1.35)
GFR, Est African American: 87 mL/min
GFR, Est Non African American: 76 mL/min
Glucose, Bld: 91 mg/dL (ref 70–99)
Sodium: 137 mEq/L (ref 135–145)
Total Bilirubin: 0.7 mg/dL (ref 0.3–1.2)
Total Protein: 7.6 g/dL (ref 6.0–8.3)

## 2013-02-17 ENCOUNTER — Encounter: Payer: Self-pay | Admitting: *Deleted

## 2013-02-17 NOTE — Progress Notes (Signed)
Patient ID: Curtis Hodge, male   DOB: April 20, 1966, 47 y.o.   MRN: [id number]   Pt stated he signed up for [facility] insurance Monday. I can now refer him for an ophthalmologist. I have made him an appointment on Wednesday, Mar 24, 2013 @ 1:30pm with Dr. Randon Goldsmith at [facility] [phone])  I have spoke with patient and give him the information. He also requested a letter be sent to him for a reminder and [facility] will also send paperwork to him. Tacey Heap RN

## 2013-03-23 ENCOUNTER — Ambulatory Visit: Payer: Self-pay

## 2013-03-23 ENCOUNTER — Other Ambulatory Visit: Payer: Self-pay

## 2013-04-05 ENCOUNTER — Ambulatory Visit: Payer: Self-pay | Admitting: Infectious Disease

## 2013-04-06 ENCOUNTER — Ambulatory Visit: Payer: Self-pay

## 2013-04-07 ENCOUNTER — Ambulatory Visit: Payer: Self-pay

## 2013-04-14 ENCOUNTER — Other Ambulatory Visit: Payer: Self-pay | Admitting: *Deleted

## 2013-04-14 ENCOUNTER — Ambulatory Visit (INDEPENDENT_AMBULATORY_CARE_PROVIDER_SITE_OTHER): Payer: Self-pay | Admitting: Infectious Disease

## 2013-04-14 ENCOUNTER — Ambulatory Visit: Payer: Self-pay

## 2013-04-14 ENCOUNTER — Encounter: Payer: Self-pay | Admitting: Infectious Disease

## 2013-04-14 VITALS — BP 141/81 | HR 83 | Temp 98.4°F | Ht 69.5 in | Wt 154.0 lb

## 2013-04-14 DIAGNOSIS — M25539 Pain in unspecified wrist: Secondary | ICD-10-CM

## 2013-04-14 DIAGNOSIS — M549 Dorsalgia, unspecified: Secondary | ICD-10-CM

## 2013-04-14 DIAGNOSIS — B20 Human immunodeficiency virus [HIV] disease: Secondary | ICD-10-CM

## 2013-04-14 DIAGNOSIS — M25532 Pain in left wrist: Secondary | ICD-10-CM

## 2013-04-14 DIAGNOSIS — E291 Testicular hypofunction: Secondary | ICD-10-CM

## 2013-04-14 MED ORDER — DARUNAVIR ETHANOLATE 800 MG PO TABS: 800.0000 mg | ORAL_TABLET | Freq: Every day | ORAL | Status: DC

## 2013-04-14 MED ORDER — TESTOSTERONE CYPIONATE 200 MG/ML IM SOLN: 100.0000 mg | INTRAMUSCULAR | Status: DC

## 2013-04-14 MED ORDER — TRAMADOL HCL 50 MG PO TABS: 50.0000 mg | ORAL_TABLET | Freq: Every evening | ORAL | Status: DC | PRN

## 2013-04-14 MED ORDER — RITONAVIR 100 MG PO TABS: 100.0000 mg | ORAL_TABLET | Freq: Every day | ORAL | Status: DC

## 2013-04-14 MED ORDER — EMTRICITABINE-TENOFOVIR DF 200-300 MG PO TABS: 1.0000 | ORAL_TABLET | Freq: Every day | ORAL | Status: DC

## 2013-04-14 NOTE — Progress Notes (Signed)
  Subjective:    Patient ID: Curtis Hodge, male    DOB: 10/26/1966, 47 y.o.   MRN: [id number]  HPI   47 year old [place] man with HIV  That HAD been  much better controlled on prezista, norvir and truvada  Apparently he failed to reenroll in the AIDS drug assistance program I believe because he tried to apply for insurance and could not obtain it.  He never started a serum testosterone replacement due to need for patient assistance.  He once again complains of multiple aches and pains including pain in his lower back as  And now pain in both of his hands. He made similar plea for narcotics at last visit. I told him I would give him ultram for now.     Review of Systems  Constitutional: Negative for fever, chills, diaphoresis, activity change, appetite change, fatigue and unexpected weight change.  HENT: Negative for congestion, sore throat, rhinorrhea, sneezing, trouble swallowing and sinus pressure.   Eyes: Negative for photophobia and visual disturbance.  Respiratory: Negative for cough, chest tightness, shortness of breath, wheezing and stridor.   Cardiovascular: Negative for chest pain, palpitations and leg swelling.  Gastrointestinal: Negative for nausea, vomiting, abdominal pain, diarrhea, constipation, blood in stool, abdominal distention and anal bleeding.  Genitourinary: Negative for dysuria, hematuria, flank pain and difficulty urinating.  Musculoskeletal: Positive for myalgias, back pain and arthralgias. Negative for joint swelling and gait problem.  Skin: Negative for color change, pallor, rash and wound.  Neurological: Negative for dizziness, tremors, weakness and light-headedness.  Hematological: Negative for adenopathy. Does not bruise/bleed easily.  Psychiatric/Behavioral: Negative for behavioral problems, confusion, sleep disturbance, dysphoric mood, decreased concentration and agitation.       Objective:   Physical Exam  Constitutional: He is oriented  to person, place, and time. He appears well-developed and well-nourished. No distress.  HENT:  Head: Normocephalic and atraumatic.  Mouth/Throat: Oropharynx is clear and moist. No oropharyngeal exudate.  Eyes: Conjunctivae and EOM are normal. Pupils are equal, round, and reactive to light. No scleral icterus.  Neck: Normal range of motion. Neck supple. No JVD present.  Cardiovascular: Normal rate, regular rhythm and normal heart sounds.  Exam reveals no gallop and no friction rub.   No murmur heard. Pulmonary/Chest: Effort normal and breath sounds normal. No respiratory distress. He has no wheezes. He has no rales. He exhibits no tenderness.  Abdominal: He exhibits no distension and no mass. There is no tenderness. There is no rebound and no guarding.  Musculoskeletal: He exhibits no edema and no tenderness.  No obvious bony deformities.  Lymphadenopathy:    He has no cervical adenopathy.  Neurological: He is alert and oriented to person, place, and time. He has normal reflexes. He exhibits normal muscle tone. Coordination normal.  Skin: Skin is warm and dry. He is not diaphoretic. No erythema. No pallor.  Psychiatric: He has a normal mood and affect. His behavior is normal. Judgment and thought content normal.          Assessment & Plan:  HIV check viral load and CD4 count continue Prezista Norvir Truvada, enroll in ADAP  Low testosterone: We'll have him meet with patient assistance to apply for assistance to obtain testosterone replacement.  Back pain, leg pain wrist pain: will give him #30 ultram. Can consider imaging in future. I DONT think he truly needs chronic narcotics withotu better proof of organic diseae, fracture etc

## 2013-04-14 NOTE — Progress Notes (Signed)
ADAP application 

## 2013-04-15 LAB — T-HELPER CELL (CD4) - (RCID CLINIC ONLY): CD4 % Helper T Cell: 25 % — ABNORMAL LOW (ref 33–55)

## 2013-04-15 LAB — HIV-1 RNA QUANT-NO REFLEX-BLD: HIV 1 RNA Quant: 533 copies/mL — ABNORMAL HIGH (ref <20)

## 2013-04-22 ENCOUNTER — Telehealth: Payer: Self-pay | Admitting: *Deleted

## 2013-04-22 NOTE — Telephone Encounter (Signed)
Called Patient [employer] and applied for Mr. Curtis Hodge over the phone.  He has been approved for the grant.  I called [facility] in [place] and gave them the BIN, Group, and PCN numbers for his Atripla.

## 2013-04-22 NOTE — Telephone Encounter (Signed)
Called [employer] to check the price of Testosterone Cypionate for Curtis Hodge.  The price is $68.94 + $3.85 shipping every 3 months.  Mr. Weigel will call when he is ready to order.

## 2013-06-16 ENCOUNTER — Other Ambulatory Visit (INDEPENDENT_AMBULATORY_CARE_PROVIDER_SITE_OTHER): Payer: Self-pay

## 2013-06-16 DIAGNOSIS — Z79899 Other long term (current) drug therapy: Secondary | ICD-10-CM

## 2013-06-16 DIAGNOSIS — B20 Human immunodeficiency virus [HIV] disease: Secondary | ICD-10-CM

## 2013-06-16 DIAGNOSIS — Z113 Encounter for screening for infections with a predominantly sexual mode of transmission: Secondary | ICD-10-CM

## 2013-06-16 LAB — COMPLETE METABOLIC PANEL WITH GFR
Albumin: 4 g/dL (ref 3.5–5.2)
Alkaline Phosphatase: 86 U/L (ref 39–117)
GFR, Est Non African American: 72 mL/min
Glucose, Bld: 110 mg/dL — ABNORMAL HIGH (ref 70–99)
Potassium: 3.9 mEq/L (ref 3.5–5.3)
Sodium: 140 mEq/L (ref 135–145)
Total Protein: 6.8 g/dL (ref 6.0–8.3)

## 2013-06-16 LAB — CBC WITH DIFFERENTIAL/PLATELET
Basophils Absolute: 0 10*3/uL (ref 0.0–0.1)
Basophils Relative: 0 % (ref 0–1)
Eosinophils Absolute: 0 10*3/uL (ref 0.0–0.7)
Eosinophils Relative: 0 % (ref 0–5)
HCT: 36.9 % — ABNORMAL LOW (ref 39.0–52.0)
MCHC: 35.8 g/dL (ref 30.0–36.0)
MCV: 94.6 fL (ref 78.0–100.0)
Monocytes Absolute: 0.2 10*3/uL (ref 0.1–1.0)
Neutro Abs: 1.3 10*3/uL — ABNORMAL LOW (ref 1.7–7.7)
RDW: 13.2 % (ref 11.5–15.5)

## 2013-06-16 LAB — LIPID PANEL
LDL Cholesterol: 81 mg/dL (ref 0–99)
VLDL: 9 mg/dL (ref 0–40)

## 2013-06-17 LAB — T-HELPER CELL (CD4) - (RCID CLINIC ONLY)
CD4 % Helper T Cell: 25 % — ABNORMAL LOW (ref 33–55)
CD4 T Cell Abs: 280 uL — ABNORMAL LOW (ref 400–2700)

## 2013-06-18 ENCOUNTER — Telehealth: Payer: Self-pay | Admitting: Infectious Disease

## 2013-06-18 NOTE — Telephone Encounter (Signed)
Tamika, can you give Curtis Hodge a call and ask him to tighten up his meds and make sure he doesn't miss a SINGLE dose of his prezista, norvir and truvada between now and when I see him in mid August. I am going to want to retest him until his VL is <200 which it should be if he takes it continuosly from today till then. Last VL was around 600

## 2013-06-22 NOTE — Telephone Encounter (Signed)
Thanks

## 2013-06-22 NOTE — Telephone Encounter (Signed)
Ok  I notified the patient, he said he would not forget.

## 2013-06-30 ENCOUNTER — Ambulatory Visit: Payer: Self-pay

## 2013-06-30 ENCOUNTER — Encounter: Payer: Self-pay | Admitting: Infectious Disease

## 2013-06-30 ENCOUNTER — Ambulatory Visit (INDEPENDENT_AMBULATORY_CARE_PROVIDER_SITE_OTHER): Payer: Self-pay | Admitting: Infectious Disease

## 2013-06-30 VITALS — BP 151/74 | HR 106 | Temp 98.8°F | Ht 70.0 in | Wt 150.2 lb

## 2013-06-30 DIAGNOSIS — R7989 Other specified abnormal findings of blood chemistry: Secondary | ICD-10-CM

## 2013-06-30 DIAGNOSIS — B20 Human immunodeficiency virus [HIV] disease: Secondary | ICD-10-CM

## 2013-06-30 DIAGNOSIS — M62838 Other muscle spasm: Secondary | ICD-10-CM

## 2013-06-30 DIAGNOSIS — Z765 Malingerer [conscious simulation]: Secondary | ICD-10-CM

## 2013-06-30 DIAGNOSIS — E291 Testicular hypofunction: Secondary | ICD-10-CM

## 2013-06-30 LAB — COMPLETE METABOLIC PANEL WITH GFR
ALT: 18 U/L (ref 0–53)
AST: 22 U/L (ref 0–37)
Albumin: 4.4 g/dL (ref 3.5–5.2)
CO2: 26 mEq/L (ref 19–32)
Calcium: 9.2 mg/dL (ref 8.4–10.5)
Chloride: 106 mEq/L (ref 96–112)
Potassium: 4.1 mEq/L (ref 3.5–5.3)

## 2013-06-30 LAB — IBC PANEL
%SAT: 23 % (ref 20–55)
TIBC: 338 ug/dL (ref 215–435)

## 2013-06-30 MED ORDER — GABAPENTIN (ONCE-DAILY) 300 MG PO TABS: 300.0000 mg | ORAL_TABLET | Freq: Every evening | ORAL | Status: DC | PRN

## 2013-06-30 NOTE — Progress Notes (Signed)
  Subjective:    Patient ID: Curtis Hodge, male    DOB: July 22, 1966, 47 y.o.   MRN: [id number]  HPI   47 year old African American man with HIV  That HAD been  much better controlled on prezista, norvir and truvada but NOT Undetectable as he should be if he were taking meds religiously. I asked him to "tighten up adherence and rtc for followup testing.    He is  once again complains of multiple aches and pains including pain in his lower back muscle spasms not responsive to flexeril ultram and asks for "percs."   . He made similar plea for narcotics at last 3 visits.    Review of Systems  Constitutional: Negative for fever, chills, diaphoresis, activity change, appetite change, fatigue and unexpected weight change.  HENT: Negative for congestion, sore throat, rhinorrhea, sneezing, trouble swallowing and sinus pressure.   Eyes: Negative for photophobia and visual disturbance.  Respiratory: Negative for cough, chest tightness, shortness of breath, wheezing and stridor.   Cardiovascular: Negative for chest pain, palpitations and leg swelling.  Gastrointestinal: Negative for nausea, vomiting, abdominal pain, diarrhea, constipation, blood in stool, abdominal distention and anal bleeding.  Genitourinary: Negative for dysuria, hematuria, flank pain and difficulty urinating.  Musculoskeletal: Positive for myalgias, back pain and arthralgias. Negative for joint swelling and gait problem.  Skin: Negative for color change, pallor, rash and wound.  Neurological: Negative for dizziness, tremors, weakness and light-headedness.  Hematological: Negative for adenopathy. Does not bruise/bleed easily.  Psychiatric/Behavioral: Negative for behavioral problems, confusion, sleep disturbance, dysphoric mood, decreased concentration and agitation.       Objective:   Physical Exam  Constitutional: He is oriented to person, place, and time. He appears well-developed and well-nourished. No distress.  HENT:   Head: Normocephalic and atraumatic.  Mouth/Throat: Oropharynx is clear and moist. No oropharyngeal exudate.  Eyes: Conjunctivae and EOM are normal. Pupils are equal, round, and reactive to light. No scleral icterus.  Neck: Normal range of motion. Neck supple. No JVD present.  Cardiovascular: Normal rate, regular rhythm and normal heart sounds.  Exam reveals no gallop and no friction rub.   No murmur heard. Pulmonary/Chest: Effort normal and breath sounds normal. No respiratory distress. He has no wheezes. He has no rales. He exhibits no tenderness.  Abdominal: He exhibits no distension and no mass. There is no tenderness. There is no rebound and no guarding.  Musculoskeletal: He exhibits no edema and no tenderness.  No obvious bony deformities.  Lymphadenopathy:    He has no cervical adenopathy.  Neurological: He is alert and oriented to person, place, and time. He has normal reflexes. He exhibits normal muscle tone. Coordination normal.  Skin: Skin is warm and dry. He is not diaphoretic. No erythema. No pallor.  Psychiatric: He has a normal mood and affect. His behavior is normal. Judgment and thought content normal.          Assessment & Plan:  HIV check viral load and CD4 count continue Prezista Norvir Truvada, t  Low testosterone: We'll have him meet with patient assistance [name]  Back pain, leg pain wrist pain, muscle spasm: will check iron stores, give gabapentin and will consider sports medicine referral  Requests for narcotics: I am suspicious that this is malingering.

## 2013-07-01 ENCOUNTER — Telehealth: Payer: Self-pay | Admitting: Infectious Disease

## 2013-07-01 DIAGNOSIS — B20 Human immunodeficiency virus [HIV] disease: Secondary | ICD-10-CM

## 2013-07-01 NOTE — Telephone Encounter (Signed)
Mr Biggins is still not as adherent as he should be VL should be <50  I would like him to ensure he is getting his meds EVERY SINGLE DAY and bring him back in 4 weeks time at which point in time his VL should be 10 times lower ie at least 70

## 2013-08-30 ENCOUNTER — Ambulatory Visit (INDEPENDENT_AMBULATORY_CARE_PROVIDER_SITE_OTHER): Payer: Self-pay | Admitting: Infectious Disease

## 2013-08-30 ENCOUNTER — Other Ambulatory Visit: Payer: Self-pay | Admitting: Licensed Clinical Social Worker

## 2013-08-30 ENCOUNTER — Encounter: Payer: Self-pay | Admitting: Infectious Disease

## 2013-08-30 VITALS — BP 132/80 | HR 79 | Temp 98.8°F | Wt 158.0 lb

## 2013-08-30 DIAGNOSIS — H538 Other visual disturbances: Secondary | ICD-10-CM

## 2013-08-30 DIAGNOSIS — M549 Dorsalgia, unspecified: Secondary | ICD-10-CM

## 2013-08-30 DIAGNOSIS — Z23 Encounter for immunization: Secondary | ICD-10-CM

## 2013-08-30 DIAGNOSIS — T8453XA Infection and inflammatory reaction due to internal right knee prosthesis, initial encounter: Secondary | ICD-10-CM | POA: Insufficient documentation

## 2013-08-30 DIAGNOSIS — B20 Human immunodeficiency virus [HIV] disease: Secondary | ICD-10-CM

## 2013-08-30 DIAGNOSIS — E291 Testicular hypofunction: Secondary | ICD-10-CM

## 2013-08-30 DIAGNOSIS — K649 Unspecified hemorrhoids: Secondary | ICD-10-CM

## 2013-08-30 DIAGNOSIS — R7989 Other specified abnormal findings of blood chemistry: Secondary | ICD-10-CM | POA: Insufficient documentation

## 2013-08-30 DIAGNOSIS — M5430 Sciatica, unspecified side: Secondary | ICD-10-CM

## 2013-08-30 DIAGNOSIS — M5432 Sciatica, left side: Secondary | ICD-10-CM

## 2013-08-30 DIAGNOSIS — Z113 Encounter for screening for infections with a predominantly sexual mode of transmission: Secondary | ICD-10-CM

## 2013-08-30 DIAGNOSIS — Z79899 Other long term (current) drug therapy: Secondary | ICD-10-CM

## 2013-08-30 DIAGNOSIS — M545 Low back pain: Secondary | ICD-10-CM

## 2013-08-30 LAB — COMPLETE METABOLIC PANEL WITH GFR
Alkaline Phosphatase: 97 U/L (ref 39–117)
GFR, Est Non African American: 84 mL/min
Glucose, Bld: 100 mg/dL — ABNORMAL HIGH (ref 70–99)
Sodium: 137 mEq/L (ref 135–145)
Total Bilirubin: 0.4 mg/dL (ref 0.3–1.2)
Total Protein: 6.8 g/dL (ref 6.0–8.3)

## 2013-08-30 LAB — CBC WITH DIFFERENTIAL/PLATELET
Basophils Absolute: 0 10*3/uL (ref 0.0–0.1)
Eosinophils Absolute: 0 10*3/uL (ref 0.0–0.7)
Eosinophils Relative: 0 % (ref 0–5)
HCT: 38.9 % — ABNORMAL LOW (ref 39.0–52.0)
Hemoglobin: 13.8 g/dL (ref 13.0–17.0)
Lymphocytes Relative: 20 % (ref 12–46)
Lymphs Abs: 1.2 10*3/uL (ref 0.7–4.0)
MCH: 33.3 pg (ref 26.0–34.0)
MCHC: 35.5 g/dL (ref 30.0–36.0)
MCV: 93.7 fL (ref 78.0–100.0)
Monocytes Absolute: 0.3 10*3/uL (ref 0.1–1.0)
Monocytes Relative: 4 % (ref 3–12)
Neutro Abs: 4.6 10*3/uL (ref 1.7–7.7)
RDW: 12.9 % (ref 11.5–15.5)
WBC: 6.1 10*3/uL (ref 4.0–10.5)

## 2013-08-30 LAB — IBC PANEL
TIBC: 292 ug/dL (ref 215–435)
UIBC: 252 ug/dL (ref 125–400)

## 2013-08-30 LAB — IRON: Iron: 40 ug/dL — ABNORMAL LOW (ref 42–165)

## 2013-08-30 MED ORDER — RITONAVIR 100 MG PO TABS: 100.0000 mg | ORAL_TABLET | Freq: Every day | ORAL | Status: DC

## 2013-08-30 MED ORDER — DARUNAVIR ETHANOLATE 800 MG PO TABS: 800.0000 mg | ORAL_TABLET | Freq: Every day | ORAL | Status: DC

## 2013-08-30 MED ORDER — EMTRICITABINE-TENOFOVIR DF 200-300 MG PO TABS: 1.0000 | ORAL_TABLET | Freq: Every day | ORAL | Status: DC

## 2013-08-30 MED ORDER — TRAMADOL HCL 50 MG PO TABS: 50.0000 mg | ORAL_TABLET | Freq: Every evening | ORAL | Status: DC | PRN

## 2013-08-30 NOTE — Progress Notes (Signed)
Subjective:    Patient ID: Curtis Hodge, male    DOB: 04-20-1966, 47 y.o.   MRN: [id number]  HPI   47 year old African American man with HIV  That HAD been  much better controlled on prezista, norvir and truvada but NOT Undetectable as he should be if he were taking meds religiously in July and in August. HE says he missed ONE dose since I last saw him and he shows me his 3 pills from his pocket ready to be taken at noon today with his lunch.  He has mx other complaints today.  He continues to c/o f multiple aches and pains including pain in his lower back muscle spasms sciatic type pain and "locking up" of muscles in legs with pain making him unable to move. At last visit he had asked for "percs" but I had written for ultram which he never obtained.  He has noticed persistence in blurry vision and "tunnel vision" in the right eye when he looks thru this one by itself. This ssx dates back to when he sustained laceration to his face and eye.  Finally he has noticed what appears to be painful hemorroid in anal canal.    Review of Systems  Constitutional: Negative for fever, chills, diaphoresis, activity change, appetite change, fatigue and unexpected weight change.  HENT: Negative for congestion, rhinorrhea, sinus pressure, sneezing, sore throat and trouble swallowing.   Eyes: Negative for photophobia and visual disturbance.  Respiratory: Negative for cough, chest tightness, shortness of breath, wheezing and stridor.   Cardiovascular: Negative for chest pain, palpitations and leg swelling.  Gastrointestinal: Negative for nausea, vomiting, abdominal pain, diarrhea, constipation, blood in stool, abdominal distention and anal bleeding.  Genitourinary: Negative for dysuria, hematuria, flank pain and difficulty urinating.  Musculoskeletal: Positive for arthralgias, back pain and myalgias. Negative for gait problem and joint swelling.  Skin: Negative for color change, pallor, rash and wound.   Neurological: Negative for dizziness, tremors, weakness and light-headedness.  Hematological: Negative for adenopathy. Does not bruise/bleed easily.  Psychiatric/Behavioral: Negative for behavioral problems, confusion, sleep disturbance, dysphoric mood, decreased concentration and agitation.       Objective:   Physical Exam  Constitutional: He is oriented to person, place, and time. He appears well-developed and well-nourished. No distress.  HENT:  Hodge: Normocephalic and atraumatic.  Mouth/Throat: Oropharynx is clear and moist. No oropharyngeal exudate.  Eyes: Conjunctivae and EOM are normal. Pupils are equal, round, and reactive to light. No scleral icterus.  Neck: Normal range of motion. Neck supple. No JVD present.  Cardiovascular: Normal rate, regular rhythm and normal heart sounds.  Exam reveals no gallop and no friction rub.   No murmur heard. Pulmonary/Chest: Effort normal and breath sounds normal. No respiratory distress. He has no wheezes. He has no rales. He exhibits no tenderness.  Abdominal: He exhibits no distension and no mass. There is no tenderness. There is no rebound and no guarding.  Musculoskeletal: He exhibits no edema and no tenderness.  No obvious bony deformities.  Lymphadenopathy:    He has no cervical adenopathy.  Neurological: He is alert and oriented to person, place, and time. He has normal reflexes. He exhibits normal muscle tone. Coordination normal.  Skin: Skin is warm and dry. He is not diaphoretic. No erythema. No pallor.  Psychiatric: He has a normal mood and affect. His behavior is normal. Judgment and thought content normal.          Assessment & Plan:  HIV check viral  load and CD4 count continue Prezista Norvir Truvada,   Hemorrhoid: refer to [facility]. Also Curtis Hodge has been performing banding procedures for pt. Pt will need "orange card" and likely needs medicaid vs health exchange application for orange card  Visual problems: will refer to  optho when we can get him orange card. He can have formal visual exam at [facility] but he really needs a formal Optho exam  Low testosterone: recheck labs, may  Need have him meet with patient assistance Curtis Hodge  Back pain, leg pain wrist pain, muscle spasm: will check iron stores, will order an MRI if this can be covered  I spent greater than 45 minutes with the patient including greater than 50% of time in face to face counsel of the patient and in coordination of their care.

## 2013-08-31 LAB — T-HELPER CELL (CD4) - (RCID CLINIC ONLY)
CD4 % Helper T Cell: 26 % — ABNORMAL LOW (ref 33–55)
CD4 T Cell Abs: 360 /uL — ABNORMAL LOW (ref 400–2700)

## 2013-08-31 LAB — HIV-1 RNA QUANT-NO REFLEX-BLD: HIV-1 RNA Quant, Log: 3.75 {Log} — ABNORMAL HIGH (ref <1.30)

## 2013-08-31 LAB — TESTOSTERONE: Testosterone: 305 ng/dL (ref 300–890)

## 2013-09-06 LAB — VITAMIN D PNL(25-HYDRXY+1,25-DIHY)-BLD
Vit D, 25-Hydroxy: 26 ng/mL — ABNORMAL LOW (ref 30–89)
Vitamin D 1, 25 (OH)2 Total: 38 pg/mL (ref 18–72)
Vitamin D2 1, 25 (OH)2: <8 pg/mL
Vitamin D3 1, 25 (OH)2: 38 pg/mL

## 2013-09-15 ENCOUNTER — Other Ambulatory Visit: Payer: Self-pay | Admitting: *Deleted

## 2013-09-15 DIAGNOSIS — B20 Human immunodeficiency virus [HIV] disease: Secondary | ICD-10-CM

## 2013-09-15 MED ORDER — EMTRICITABINE-TENOFOVIR DF 200-300 MG PO TABS: 1.0000 | ORAL_TABLET | Freq: Every day | ORAL | Status: DC

## 2013-09-15 MED ORDER — DARUNAVIR ETHANOLATE 800 MG PO TABS: 800.0000 mg | ORAL_TABLET | Freq: Every day | ORAL | Status: DC

## 2013-09-15 MED ORDER — RITONAVIR 100 MG PO TABS: 100.0000 mg | ORAL_TABLET | Freq: Every day | ORAL | Status: DC

## 2013-12-06 ENCOUNTER — Telehealth: Payer: Self-pay | Admitting: Infectious Disease

## 2013-12-06 NOTE — Telephone Encounter (Signed)
Curtis Hodge has dropped off Curtis Hodge a bit. Has not been seen since fall when he was poorly controlled and NOt renewed for Spring ADAP  Needs to come in for labs, visit and meet with Curtis Hodge Ltdish

## 2013-12-07 NOTE — Telephone Encounter (Signed)
Called the patient per Dr VD and scheduled him to come in for a follow up visit. The patient request that I send him a reminder of his scheduled appt. Advised him will put in the mail today and will get a reminder call closer to the appt day and time.

## 2014-01-05 ENCOUNTER — Ambulatory Visit: Payer: Self-pay | Admitting: Infectious Disease

## 2014-01-05 ENCOUNTER — Ambulatory Visit: Payer: Self-pay

## 2014-01-31 ENCOUNTER — Ambulatory Visit: Payer: Self-pay

## 2014-03-22 ENCOUNTER — Emergency Department (INDEPENDENT_AMBULATORY_CARE_PROVIDER_SITE_OTHER)
Admission: EM | Admit: 2014-03-22 | Discharge: 2014-03-22 | Disposition: A | Discharge status: Home / Self Care | Payer: Self-pay | Source: Home / Self Care | Attending: Family Medicine | Admitting: Family Medicine

## 2014-03-22 ENCOUNTER — Encounter (HOSPITAL_COMMUNITY): Payer: Self-pay | Admitting: Emergency Medicine

## 2014-03-22 ENCOUNTER — Emergency Department (INDEPENDENT_AMBULATORY_CARE_PROVIDER_SITE_OTHER): Payer: Self-pay

## 2014-03-22 DIAGNOSIS — S63599A Other specified sprain of unspecified wrist, initial encounter: Secondary | ICD-10-CM

## 2014-03-22 DIAGNOSIS — S53449A Ulnar collateral ligament sprain of unspecified elbow, initial encounter: Secondary | ICD-10-CM

## 2014-03-22 MED ORDER — DICLOFENAC POTASSIUM 50 MG PO TABS: 50.0000 mg | ORAL_TABLET | Freq: Three times a day (TID) | ORAL | Status: DC

## 2014-03-22 NOTE — Discharge Instructions (Signed)
Wear splint and use medicine as needed, see orthopedist if further problems.

## 2014-03-22 NOTE — ED Provider Notes (Signed)
CSN: [id number]     Arrival date & time 03/22/14  1029 History   First MD Initiated Contact with Patient 03/22/14 1123     No chief complaint on file.  (Consider location/radiation/quality/duration/timing/severity/associated sxs/prior Treatment) Patient is a 48 y.o. male presenting with wrist pain. The history is provided by the patient.  Wrist Pain This is a new problem. The current episode started 2 days ago. The problem has not changed since onset.   Past Medical History  Diagnosis Date  . HIV (human immunodeficiency virus infection)   . Hepatitis B   . Incarceration    Past Surgical History  Procedure Laterality Date  . I&d extremity  05/21/2012    Procedure: IRRIGATION AND DEBRIDEMENT EXTREMITY;  Surgeon: Javier DockerJeffrey C Beane, MD;  Location: [facility] OR;  Service: Orthopedics;  Laterality: Right;   No family history on file. History  Substance Use Topics  . Smoking status: Current Every Day Smoker -- 1.50 packs/day  . Smokeless tobacco: Never Used  . Alcohol Use: 1.0 oz/week    2 drink(s) per week    Review of Systems  Constitutional: Negative.   Musculoskeletal: Positive for joint swelling.  Skin: Negative.     Allergies  Review of patient's allergies indicates no known allergies.  Home Medications   Prior to Admission medications   Medication Sig Start Date End Date Taking? Authorizing Provider  Darunavir Ethanolate (PREZISTA) 800 MG tablet Take 1 tablet (800 mg total) by mouth daily. 09/15/13   Randall Hissornelius N Van Dam, MD  emtricitabine-tenofovir (TRUVADA) 200-300 MG per tablet Take 1 tablet by mouth daily. 09/15/13 09/15/14  Randall Hissornelius N Van Dam, MD  Gabapentin, PHN, 300 MG TABS Take 300 mg by mouth at bedtime as needed. 06/30/13   Randall Hissornelius N Van Dam, MD  ritonavir (NORVIR) 100 MG TABS tablet Take 1 tablet (100 mg total) by mouth daily. 09/15/13   Randall Hissornelius N Van Dam, MD  testosterone cypionate (DEPOTESTOTERONE CYPIONATE) 200 MG/ML injection Inject 0.5 mLs (100 mg total) into  the muscle every 14 (fourteen) days. 04/14/13   Randall Hissornelius N Van Dam, MD  traMADol (ULTRAM) 50 MG tablet Take 1 tablet (50 mg total) by mouth at bedtime as needed for pain. 08/30/13   Randall Hissornelius N Van Dam, MD   BP 151/87  Pulse 67  Temp(Src) 98.7 F (37.1 C) (Oral)  Resp 18  SpO2 100% Physical Exam  Nursing note and vitals reviewed. Constitutional: He is oriented to person, place, and time. He appears well-developed and well-nourished.  Musculoskeletal: He exhibits tenderness.       Left wrist: He exhibits bony tenderness, swelling and deformity.       Arms: Neurological: He is alert and oriented to person, place, and time.  Skin: Skin is warm and dry.    ED Course  Procedures (including critical care time) Labs Review Labs Reviewed - No data to display  Imaging Review No results found.   MDM  No diagnosis found.     Linna HoffJames D Kindl, MD 03/22/14 [phone]

## 2014-03-22 NOTE — ED Notes (Signed)
Left wrist pain, onset 2 days ago.  Patient fell doing yard work.  Able to move fingers , normal sensation , pain with movement of wrist.  History of wrist injury many years ago

## 2014-03-30 ENCOUNTER — Encounter: Payer: Self-pay | Admitting: *Deleted

## 2014-03-30 ENCOUNTER — Ambulatory Visit (INDEPENDENT_AMBULATORY_CARE_PROVIDER_SITE_OTHER): Payer: Self-pay | Admitting: Infectious Disease

## 2014-03-30 ENCOUNTER — Encounter: Payer: Self-pay | Admitting: Infectious Disease

## 2014-03-30 VITALS — BP 132/83 | HR 86 | Temp 98.3°F | Ht 70.0 in | Wt 150.0 lb

## 2014-03-30 DIAGNOSIS — Z113 Encounter for screening for infections with a predominantly sexual mode of transmission: Secondary | ICD-10-CM

## 2014-03-30 DIAGNOSIS — Z91199 Patient's noncompliance with other medical treatment and regimen due to unspecified reason: Secondary | ICD-10-CM

## 2014-03-30 DIAGNOSIS — Z9119 Patient's noncompliance with other medical treatment and regimen: Secondary | ICD-10-CM

## 2014-03-30 DIAGNOSIS — R7989 Other specified abnormal findings of blood chemistry: Secondary | ICD-10-CM

## 2014-03-30 DIAGNOSIS — E291 Testicular hypofunction: Secondary | ICD-10-CM

## 2014-03-30 DIAGNOSIS — B20 Human immunodeficiency virus [HIV] disease: Secondary | ICD-10-CM

## 2014-03-30 MED ORDER — DARUNAVIR-COBICISTAT 800-150 MG PO TABS: 1.0000 | ORAL_TABLET | Freq: Every day | ORAL | Status: DC

## 2014-03-30 NOTE — Progress Notes (Signed)
  Subjective:    Patient ID: Curtis Hodge, male    DOB: 06/27/1966, 48 y.o.   MRN: [id number]  HPI   48 year old African American man with HIV  That HAD been  much better controlled on prezista, norvir and truvada but NOT Undetectable as he should be if he were taking meds religiously in July and in August.  I last saw the patient in August of 2014. He presented to clinic today he stated that he had a new job and now had employment or insurance. Unfortunately appears that he does not yet have such insurance and had not yet renewed his AIDS drug assistance program nor had he renewed his [employer] funding. He told me he was taking his antiretroviral medications was not clear to me how he has been obtaining the medications. While trying to interview the patient he was talking to his insurance provider to try to obtain numbers for this. I was not able to complete my interview or exam due to his eventually leading to go see the financial counselor.   Review of Systems  Unable to perform ROS Constitutional: Negative for fever, chills, diaphoresis, activity change, appetite change, fatigue and unexpected weight change.  HENT: Negative for congestion, rhinorrhea, sinus pressure, sneezing, sore throat and trouble swallowing.   Eyes: Negative for photophobia and visual disturbance.  Respiratory: Negative for cough, chest tightness, shortness of breath, wheezing and stridor.   Cardiovascular: Negative for chest pain, palpitations and leg swelling.  Gastrointestinal: Negative for nausea, vomiting, abdominal pain, diarrhea, constipation, blood in stool, abdominal distention and anal bleeding.  Genitourinary: Negative for dysuria, hematuria, flank pain and difficulty urinating.  Musculoskeletal: Positive for arthralgias, back pain and myalgias. Negative for gait problem and joint swelling.  Skin: Negative for color change, pallor, rash and wound.  Neurological: Negative for dizziness, tremors, weakness  and light-headedness.  Hematological: Negative for adenopathy. Does not bruise/bleed easily.  Psychiatric/Behavioral: Negative for behavioral problems, confusion, sleep disturbance, dysphoric mood, decreased concentration and agitation.       Objective:   Physical Exam  Constitutional: He is oriented to person, place, and time. He appears well-developed and well-nourished. No distress.  HENT:  Head: Normocephalic and atraumatic.  Eyes: EOM are normal.  Neck: Normal range of motion. Neck supple.  Cardiovascular: Normal rate and regular rhythm.   Pulmonary/Chest: Effort normal. No respiratory distress. He has no wheezes.  Abdominal: He exhibits no distension.  Musculoskeletal: Normal range of motion.  No obvious bony deformities.  Neurological: He is alert and oriented to person, place, and time. Coordination normal.  Skin: Skin is warm and dry. He is not diaphoretic.  Psychiatric: His behavior is normal. Judgment and thought content normal. His mood appears anxious.          Assessment & Plan:  HIV: IF he has truly been taking his medicines then  check viral load and CD4 . Then change to PREZCOBIX and Truvada  Low testosterone: does not appear to be back on this

## 2014-03-31 LAB — COMPLETE METABOLIC PANEL WITH GFR
ALBUMIN: 4.1 g/dL (ref 3.5–5.2)
ALT: 18 U/L (ref 0–53)
AST: 24 U/L (ref 0–37)
Alkaline Phosphatase: 91 U/L (ref 39–117)
BUN: 13 mg/dL (ref 6–23)
CALCIUM: 9 mg/dL (ref 8.4–10.5)
CHLORIDE: 104 meq/L (ref 96–112)
CO2: 29 meq/L (ref 19–32)
CREATININE: 1.15 mg/dL (ref 0.50–1.35)
GFR, EST AFRICAN AMERICAN: 87 mL/min
GFR, Est Non African American: 75 mL/min
Glucose, Bld: 82 mg/dL (ref 70–99)
Potassium: 4.3 mEq/L (ref 3.5–5.3)
Sodium: 140 mEq/L (ref 135–145)
Total Bilirubin: 0.5 mg/dL (ref 0.2–1.2)
Total Protein: 7.3 g/dL (ref 6.0–8.3)

## 2014-03-31 LAB — CBC WITH DIFFERENTIAL/PLATELET
BASOS ABS: 0 10*3/uL (ref 0.0–0.1)
Basophils Relative: 0 % (ref 0–1)
EOS ABS: 0 10*3/uL (ref 0.0–0.7)
Eosinophils Relative: 1 % (ref 0–5)
HCT: 37.6 % — ABNORMAL LOW (ref 39.0–52.0)
Hemoglobin: 13.4 g/dL (ref 13.0–17.0)
Lymphocytes Relative: 42 % (ref 12–46)
Lymphs Abs: 1.4 10*3/uL (ref 0.7–4.0)
MCH: 32.9 pg (ref 26.0–34.0)
MCHC: 35.6 g/dL (ref 30.0–36.0)
MCV: 92.4 fL (ref 78.0–100.0)
Monocytes Absolute: 0.2 10*3/uL (ref 0.1–1.0)
Monocytes Relative: 7 % (ref 3–12)
NEUTROS PCT: 50 % (ref 43–77)
Neutro Abs: 1.7 10*3/uL (ref 1.7–7.7)
Platelets: 178 10*3/uL (ref 150–400)
RBC: 4.07 MIL/uL — AB (ref 4.22–5.81)
RDW: 12.8 % (ref 11.5–15.5)
WBC: 3.4 10*3/uL — ABNORMAL LOW (ref 4.0–10.5)

## 2014-03-31 LAB — RPR

## 2014-04-01 ENCOUNTER — Other Ambulatory Visit: Payer: Self-pay | Admitting: Licensed Clinical Social Worker

## 2014-04-01 DIAGNOSIS — B20 Human immunodeficiency virus [HIV] disease: Secondary | ICD-10-CM

## 2014-04-01 DIAGNOSIS — Z91199 Patient's noncompliance with other medical treatment and regimen due to unspecified reason: Secondary | ICD-10-CM

## 2014-04-01 DIAGNOSIS — R7989 Other specified abnormal findings of blood chemistry: Secondary | ICD-10-CM

## 2014-04-01 DIAGNOSIS — Z9119 Patient's noncompliance with other medical treatment and regimen: Secondary | ICD-10-CM

## 2014-04-01 LAB — T-HELPER CELL (CD4) - (RCID CLINIC ONLY)
CD4 % Helper T Cell: 27 % — ABNORMAL LOW (ref 33–55)
CD4 T Cell Abs: 410 /uL (ref 400–2700)

## 2014-04-01 LAB — HIV-1 RNA ULTRAQUANT REFLEX TO GENTYP+
HIV 1 RNA Quant: 664 copies/mL — ABNORMAL HIGH (ref <20)
HIV-1 RNA QUANT, LOG: 2.82 {Log} — AB (ref <1.30)

## 2014-04-01 MED ORDER — EMTRICITABINE-TENOFOVIR DF 200-300 MG PO TABS: 1.0000 | ORAL_TABLET | Freq: Every day | ORAL | Status: DC

## 2014-04-01 MED ORDER — DARUNAVIR-COBICISTAT 800-150 MG PO TABS: 1.0000 | ORAL_TABLET | Freq: Every day | ORAL | Status: DC

## 2014-04-05 LAB — HLA B*5701: HLA-B 5701 W/RFLX HLA-B HIGH: NEGATIVE

## 2014-04-06 ENCOUNTER — Telehealth: Payer: Self-pay | Admitting: Infectious Disease

## 2014-04-06 NOTE — Telephone Encounter (Signed)
Pts viral load is in low hundreds. Need to make sure he filled new scripts after change to prezcobix and then make sure he takes his meds religiously next 3-4 weeks and recheck labs  Do you think we can talk to Feliz Beamravis to see if his meds are being filled?

## 2014-04-07 NOTE — Telephone Encounter (Signed)
I left the patient a message to call me so we can f/u how on his medication, [facility]

## 2014-04-07 NOTE — Telephone Encounter (Signed)
Patient did not pick up Prezcobix last week

## 2014-04-07 NOTE — Telephone Encounter (Signed)
Yes I will try to reach him again

## 2014-04-07 NOTE — Telephone Encounter (Signed)
Can we bring him in and trouble shoot this?

## 2014-04-08 NOTE — Telephone Encounter (Signed)
Thanks Tamka!

## 2014-04-26 ENCOUNTER — Ambulatory Visit (INDEPENDENT_AMBULATORY_CARE_PROVIDER_SITE_OTHER): Payer: Self-pay | Admitting: Infectious Disease

## 2014-04-26 ENCOUNTER — Other Ambulatory Visit: Payer: Self-pay

## 2014-04-26 ENCOUNTER — Ambulatory Visit (HOSPITAL_COMMUNITY)
Admission: RE | Admit: 2014-04-26 | Discharge: 2014-04-26 | Disposition: A | Discharge status: Home / Self Care | Payer: Self-pay | Source: Ambulatory Visit | Attending: Infectious Disease | Admitting: Infectious Disease

## 2014-04-26 ENCOUNTER — Encounter: Payer: Self-pay | Admitting: Infectious Disease

## 2014-04-26 VITALS — BP 128/84 | HR 72 | Temp 98.0°F | Ht 70.0 in | Wt 157.0 lb

## 2014-04-26 DIAGNOSIS — J029 Acute pharyngitis, unspecified: Secondary | ICD-10-CM

## 2014-04-26 DIAGNOSIS — R079 Chest pain, unspecified: Secondary | ICD-10-CM | POA: Insufficient documentation

## 2014-04-26 DIAGNOSIS — G4720 Circadian rhythm sleep disorder, unspecified type: Secondary | ICD-10-CM

## 2014-04-26 DIAGNOSIS — B20 Human immunodeficiency virus [HIV] disease: Secondary | ICD-10-CM

## 2014-04-26 DIAGNOSIS — I498 Other specified cardiac arrhythmias: Secondary | ICD-10-CM | POA: Insufficient documentation

## 2014-04-26 DIAGNOSIS — G4700 Insomnia, unspecified: Secondary | ICD-10-CM

## 2014-04-26 NOTE — Progress Notes (Signed)
  Subjective:    Patient ID: Curtis Hodge, male    DOB: 1966/10/14, 48 y.o.   MRN: [id number]  HPI   48 year old [place] American man with HIV  That HAD been  much better controlled on prezista, norvir and truvada. He had problems again began on top of things including redoing his ADAP application and her medicines. Now is renewed and he finally has gotten prescriptions for PREZCOBIX and Truvada which he states he started this week.  He has had problems for the entire last week with feeling poorly and having a hoarse throat sore throat with this extreme fatigue chest pain feeling like an elephant was sitting on his chest exhaustion and falling asleep frequently. He has not been to work for the entire week. Denies fevers or chills he has had little cough large and nonproductive. No nausea or vomiting. Positive for diffuse myalgias.   Review of Systems  Constitutional: Positive for fatigue. Negative for fever, chills, diaphoresis, activity change, appetite change and unexpected weight change.  HENT: Positive for sore throat and voice change. Negative for congestion, rhinorrhea, sinus pressure, sneezing and trouble swallowing.   Respiratory: Negative for cough, chest tightness, shortness of breath, wheezing and stridor.   Cardiovascular: Positive for chest pain. Negative for palpitations and leg swelling.  Gastrointestinal: Negative for nausea, vomiting, abdominal pain, diarrhea, constipation and abdominal distention.  Genitourinary: Negative for hematuria and flank pain.  Musculoskeletal: Positive for arthralgias, back pain and myalgias. Negative for gait problem and joint swelling.  Skin: Negative for color change, pallor, rash and wound.  Neurological: Negative for dizziness, tremors, weakness and light-headedness.  Psychiatric/Behavioral: Positive for sleep disturbance. Negative for behavioral problems, confusion, dysphoric mood, decreased concentration and agitation.       Objective:   Physical Exam  Constitutional: He is oriented to person, place, and time. He appears well-developed and well-nourished. No distress.  HENT:  Head: Normocephalic and atraumatic.  Eyes: EOM are normal.  Neck: Normal range of motion. Neck supple.  Cardiovascular: Normal rate and regular rhythm.   Pulmonary/Chest: Effort normal. No respiratory distress. He has no wheezes.  Abdominal: He exhibits no distension.  Musculoskeletal: Normal range of motion.  No obvious bony deformities.  Neurological: He is alert and oriented to person, place, and time. Coordination normal.  Skin: Skin is warm and dry. He is not diaphoretic.  Psychiatric: His behavior is normal. Judgment and thought content normal. His mood appears anxious.          Assessment & Plan:  HIV: continue PREZCOBIX and Truvada. Recheck labs in one month's time.  Needs to reduce ADAP for the September .  Malaise chest pain falling asleep frequently:  We checked orthostatic vital signs and these were negative. We checked an EKG which showed sinus bradycardia with T-wave inversion in V1 but no other significant ST or T wave changes. There is no prior EKG for comparison. I ordered stat CBC metabolic panel troponin blood cultures and a chest x-ray.  I spent greater than 40 minutes with the patient including greater than 50% of time in face to face counsel of the patient and in coordination of their care.  I wrote a letter excusing him from work from today.  Low testosterone: should check with labs next visit

## 2014-05-02 ENCOUNTER — Ambulatory Visit: Payer: Self-pay | Admitting: Infectious Disease

## 2014-10-04 ENCOUNTER — Encounter (HOSPITAL_COMMUNITY): Payer: Self-pay | Admitting: Emergency Medicine

## 2014-10-04 ENCOUNTER — Emergency Department (HOSPITAL_COMMUNITY)
Admission: EM | Admit: 2014-10-04 | Discharge: 2014-10-04 | Disposition: A | Discharge status: Home / Self Care | Payer: Self-pay | Attending: Emergency Medicine | Admitting: Emergency Medicine

## 2014-10-04 DIAGNOSIS — Z21 Asymptomatic human immunodeficiency virus [HIV] infection status: Secondary | ICD-10-CM | POA: Insufficient documentation

## 2014-10-04 DIAGNOSIS — M109 Gout, unspecified: Secondary | ICD-10-CM | POA: Insufficient documentation

## 2014-10-04 DIAGNOSIS — Z8619 Personal history of other infectious and parasitic diseases: Secondary | ICD-10-CM | POA: Insufficient documentation

## 2014-10-04 DIAGNOSIS — Z79899 Other long term (current) drug therapy: Secondary | ICD-10-CM | POA: Insufficient documentation

## 2014-10-04 DIAGNOSIS — Z720 Tobacco use: Secondary | ICD-10-CM | POA: Insufficient documentation

## 2014-10-04 LAB — URIC ACID: Uric Acid, Serum: 3.1 mg/dL — ABNORMAL LOW (ref 4.0–7.8)

## 2014-10-04 MED ORDER — OXYCODONE-ACETAMINOPHEN 5-325 MG PO TABS
1.0000 | ORAL_TABLET | Freq: Once | ORAL | Status: AC
  Administered 2014-10-04: 1 via ORAL
  Filled 2014-10-04: qty 1

## 2014-10-04 MED ORDER — INDOMETHACIN 50 MG PO CAPS: 50.0000 mg | ORAL_CAPSULE | Freq: Three times a day (TID) | ORAL | Status: DC | PRN

## 2014-10-04 MED ORDER — OXYCODONE-ACETAMINOPHEN 5-325 MG PO TABS: 1.0000 | ORAL_TABLET | ORAL | Status: DC | PRN

## 2014-10-04 MED ORDER — PREDNISONE 50 MG PO TABS: 50.0000 mg | ORAL_TABLET | Freq: Every day | ORAL | Status: DC

## 2014-10-04 MED ORDER — PREDNISONE 20 MG PO TABS
60.0000 mg | ORAL_TABLET | Freq: Once | ORAL | Status: AC
Start: 2014-10-04 — End: 2014-10-04
  Administered 2014-10-04: 60 mg via ORAL
  Filled 2014-10-04: qty 3

## 2014-10-04 MED ORDER — INDOMETHACIN 25 MG PO CAPS
50.0000 mg | ORAL_CAPSULE | Freq: Once | ORAL | Status: AC
  Administered 2014-10-04: 50 mg via ORAL
  Filled 2014-10-04: qty 2

## 2014-10-04 NOTE — ED Notes (Signed)
Hx of sciatica pain, right sided. HX of HIV

## 2014-10-04 NOTE — ED Notes (Signed)
Provided crutches for height, 5 ft 10 inches

## 2014-10-04 NOTE — Discharge Instructions (Signed)
Gout Gout is an inflammatory arthritis caused by a buildup of uric acid crystals in the joints. Uric acid is a chemical that is normally present in the blood. When the level of uric acid in the blood is too high it can form crystals that deposit in your joints and tissues. This causes joint redness, soreness, and swelling (inflammation). Repeat attacks are common. Over time, uric acid crystals can form into masses (tophi) near a joint, destroying bone and causing disfigurement. Gout is treatable and often preventable. CAUSES  The disease begins with elevated levels of uric acid in the blood. Uric acid is produced by your body when it breaks down a naturally found substance called purines. Certain foods you eat, such as meats and fish, contain high amounts of purines. Causes of an elevated uric acid level include:  Being passed down from parent to child (heredity).  Diseases that cause increased uric acid production (such as obesity, psoriasis, and certain cancers).  Excessive alcohol use.  Diet, especially diets rich in meat and seafood.  Medicines, including certain cancer-fighting medicines (chemotherapy), water pills (diuretics), and aspirin.  Chronic kidney disease. The kidneys are no longer able to remove uric acid well.  Problems with metabolism. Conditions strongly associated with gout include:  Obesity.  High blood pressure.  High cholesterol.  Diabetes. Not everyone with elevated uric acid levels gets gout. It is not understood why some people get gout and others do not. Surgery, joint injury, and eating too much of certain foods are some of the factors that can lead to gout attacks. SYMPTOMS   An attack of gout comes on quickly. It causes intense pain with redness, swelling, and warmth in a joint.  Fever can occur.  Often, only one joint is involved. Certain joints are more commonly involved:  Base of the big toe.  Knee.  Ankle.  Wrist.  Finger. Without  treatment, an attack usually goes away in a few days to weeks. Between attacks, you usually will not have symptoms, which is different from many other forms of arthritis. DIAGNOSIS  Your caregiver will suspect gout based on your symptoms and exam. In some cases, tests may be recommended. The tests may include:  Blood tests.  Urine tests.  X-rays.  Joint fluid exam. This exam requires a needle to remove fluid from the joint (arthrocentesis). Using a microscope, gout is confirmed when uric acid crystals are seen in the joint fluid. TREATMENT  There are two phases to gout treatment: treating the sudden onset (acute) attack and preventing attacks (prophylaxis).  Treatment of an Acute Attack.  Medicines are used. These include anti-inflammatory medicines or steroid medicines.  An injection of steroid medicine into the affected joint is sometimes necessary.  The painful joint is rested. Movement can worsen the arthritis.  You may use warm or cold treatments on painful joints, depending which works best for you.  Treatment to Prevent Attacks.  If you suffer from frequent gout attacks, your caregiver may advise preventive medicine. These medicines are started after the acute attack subsides. These medicines either help your kidneys eliminate uric acid from your body or decrease your uric acid production. You may need to stay on these medicines for a very long time.  The early phase of treatment with preventive medicine can be associated with an increase in acute gout attacks. For this reason, during the first few months of treatment, your caregiver may also advise you to take medicines usually used for acute gout treatment. Be sure you  understand your caregiver's directions. Your caregiver may make several adjustments to your medicine dose before these medicines are effective.  Discuss dietary treatment with your caregiver or dietitian. Alcohol and drinks high in sugar and fructose and foods  such as meat, poultry, and seafood can increase uric acid levels. Your caregiver or dietitian can advise you on drinks and foods that should be limited. HOME CARE INSTRUCTIONS   Do not take aspirin to relieve pain. This raises uric acid levels.  Only take over-the-counter or prescription medicines for pain, discomfort, or fever as directed by your caregiver.  Rest the joint as much as possible. When in bed, keep sheets and blankets off painful areas.  Keep the affected joint raised (elevated).  Apply warm or cold treatments to painful joints. Use of warm or cold treatments depends on which works best for you.  Use crutches if the painful joint is in your leg.  Drink enough fluids to keep your urine clear or pale yellow. This helps your body get rid of uric acid. Limit alcohol, sugary drinks, and fructose drinks.  Follow your dietary instructions. Pay careful attention to the amount of protein you eat. Your daily diet should emphasize fruits, vegetables, whole grains, and fat-free or low-fat milk products. Discuss the use of coffee, vitamin C, and cherries with your caregiver or dietitian. These may be helpful in lowering uric acid levels.  Maintain a healthy body weight. SEEK MEDICAL CARE IF:   You develop diarrhea, vomiting, or any side effects from medicines.  You do not feel better in 24 hours, or you are getting worse. SEEK IMMEDIATE MEDICAL CARE IF:   Your joint becomes suddenly more tender, and you have chills or a fever. MAKE SURE YOU:   Understand these instructions.  Will watch your condition.  Will get help right away if you are not doing well or get worse. Document Released: 11/01/2000 Document Revised: 03/21/2014 Document Reviewed: 06/17/2012 [facility] Patient Information 2015 [place], [place]. This information is not intended to replace advice given to you by your health care provider. Make sure you discuss any questions you have with your health care  provider.  Prednisone tablets What is this medicine? PREDNISONE (PRED ni sone) is a corticosteroid. It is commonly used to treat inflammation of the skin, joints, lungs, and other organs. Common conditions treated include asthma, allergies, and arthritis. It is also used for other conditions, such as blood disorders and diseases of the adrenal glands. This medicine may be used for other purposes; ask your health care provider or pharmacist if you have questions. COMMON BRAND NAME(S): Deltasone, Predone, Sterapred, Sterapred DS What should I tell my health care provider before I take this medicine? They need to know if you have any of these conditions: -Cushing's syndrome -diabetes -glaucoma -heart disease -high blood pressure -infection (especially a virus infection such as chickenpox, cold sores, or herpes) -kidney disease -liver disease -mental illness -myasthenia gravis -osteoporosis -seizures -stomach or intestine problems -thyroid disease -an unusual or allergic reaction to lactose, prednisone, other medicines, foods, dyes, or preservatives -pregnant or trying to get pregnant -breast-feeding How should I use this medicine? Take this medicine by mouth with a glass of water. Follow the directions on the prescription label. Take this medicine with food. If you are taking this medicine once a day, take it in the morning. Do not take more medicine than you are told to take. Do not suddenly stop taking your medicine because you may develop a severe reaction. Your doctor will  tell you how much medicine to take. If your doctor wants you to stop the medicine, the dose may be slowly lowered over time to avoid any side effects. °Talk to your pediatrician regarding the use of this medicine in children. Special care may be needed. °Overdosage: If you think you have taken too much of this medicine contact a poison control center or emergency room at once. °NOTE: This medicine is only for you. Do  not share this medicine with others. °What if I miss a dose? °If you miss a dose, take it as soon as you can. If it is almost time for your next dose, talk to your doctor or health care professional. You may need to miss a dose or take an extra dose. Do not take double or extra doses without advice. °What may interact with this medicine? °Do not take this medicine with any of the following medications: °-metyrapone °-mifepristone °This medicine may also interact with the following medications: °-aminoglutethimide °-amphotericin B °-aspirin and aspirin-like medicines °-barbiturates °-certain medicines for diabetes, like glipizide or glyburide °-cholestyramine °-cholinesterase inhibitors °-cyclosporine °-digoxin °-diuretics °-ephedrine °-male hormones, like estrogens and birth control pills °-isoniazid °-ketoconazole °-NSAIDS, medicines for pain and inflammation, like ibuprofen or naproxen °-phenytoin °-rifampin °-toxoids °-vaccines °-warfarin °This list may not describe all possible interactions. Give your health care provider a list of all the medicines, herbs, non-prescription drugs, or dietary supplements you use. Also tell them if you smoke, drink alcohol, or use illegal drugs. Some items may interact with your medicine. °What should I watch for while using this medicine? °Visit your doctor or health care professional for regular checks on your progress. If you are taking this medicine over a prolonged period, carry an identification card with your name and address, the type and dose of your medicine, and your doctor's name and address. °This medicine may increase your risk of getting an infection. Tell your doctor or health care professional if you are around anyone with measles or chickenpox, or if you develop sores or blisters that do not heal properly. °If you are going to have surgery, tell your doctor or health care professional that you have taken this medicine within the last twelve months. °Ask your  doctor or health care professional about your diet. You may need to lower the amount of salt you eat. °This medicine may affect blood sugar levels. If you have diabetes, check with your doctor or health care professional before you change your diet or the dose of your diabetic medicine. °What side effects may I notice from receiving this medicine? °Side effects that you should report to your doctor or health care professional as soon as possible: °-allergic reactions like skin rash, itching or hives, swelling of the face, lips, or tongue °-changes in emotions or moods °-changes in vision °-depressed mood °-eye pain °-fever or chills, cough, sore throat, pain or difficulty passing urine °-increased thirst °-swelling of ankles, feet °Side effects that usually do not require medical attention (report to your doctor or health care professional if they continue or are bothersome): °-confusion, excitement, restlessness °-headache °-nausea, vomiting °-skin problems, acne, thin and shiny skin °-trouble sleeping °-weight gain °This list may not describe all possible side effects. Call your doctor for medical advice about side effects. You may report side effects to FDA at 1-800-FDA-1088. °Where should I keep my medicine? °Keep out of the reach of children. °Store at room temperature between 15 and 30 degrees C (59 and 86 degrees F). Protect from light.   Keep container tightly closed. Throw away any unused medicine after the expiration date. NOTE: This sheet is a summary. It may not cover all possible information. If you have questions about this medicine, talk to your doctor, pharmacist, or health care provider.  2015, [employer]. (2011-06-20 10:57:14)  Indomethacin capsules What is this medicine? INDOMETHACIN (in doe METH a sin) is a non-steroidal anti-inflammatory drug (NSAID). It is used to reduce swelling and to treat pain. It may be used for painful joint and muscular problems such as arthritis,  tendinitis, bursitis, and gout. This medicine may be used for other purposes; ask your health care provider or pharmacist if you have questions. COMMON BRAND NAME(S): Indocin, TIVORBEX What should I tell my health care provider before I take this medicine? They need to know if you have any of these conditions: -asthma, especially aspirin sensitive asthma -coronary artery bypass graft (CABG) surgery within the past 2 weeks -depression -drink more than 3 alcohol containing drinks a day -heart disease or circulation problems like heart failure or leg edema (fluid retention) -high blood pressure -kidney disease -liver disease -Parkinson's disease -seizures -stomach bleeding or ulcers -an unusual or allergic reaction to indomethacin, aspirin, other NSAIDs, other medicines, foods, dyes, or preservatives -pregnant or trying to get pregnant -breast-feeding How should I use this medicine? Take this medicine by mouth with food and with a full glass of water. Follow the directions on the prescription label. Take your medicine at regular intervals. Do not take your medicine more often than directed. Long-term, continuous use may increase the risk of heart attack or stroke. A special MedGuide will be given to you by the pharmacist with each prescription and refill. Be sure to read this information carefully each time. Talk to your pediatrician regarding the use of this medicine in children. Special care may be needed. While this drug may be prescribed for children as young as 15 years for selected conditions, precautions do apply. Elderly patients over 46 years old may have a stronger reaction and need a smaller dose. Overdosage: If you think you have taken too much of this medicine contact a poison control center or emergency room at once. NOTE: This medicine is only for you. Do not share this medicine with others. What if I miss a dose? If you miss a dose, take it as soon as you can. If it is almost  time for your next dose, take only that dose. Do not take double or extra doses. What may interact with this medicine? Do not take this medicine with any of the following medications: -cidofovir -diflunisal -ketorolac -methotrexate -pemetrexed -triamterene This medicine may also interact with the following medications: -alcohol -antacids -aspirin and aspirin-like medicines -cyclosporine -digoxin -diuretics -lithium -medicines for diabetes -medicines for high blood pressure -medicines that affect platelets -medicines that treat or prevent blood clots like warfarin -NSAIDs, medicines for pain and inflammation, like ibuprofen or naproxen -probenecid -steroid medicines like prednisone or cortisone This list may not describe all possible interactions. Give your health care provider a list of all the medicines, herbs, non-prescription drugs, or dietary supplements you use. Also tell them if you smoke, drink alcohol, or use illegal drugs. Some items may interact with your medicine. What should I watch for while using this medicine? Tell your doctor or health care professional if your pain does not get better. Talk to your doctor before taking another medicine for pain. Do not treat yourself. This medicine does not prevent heart attack or stroke. In fact, this  medicine may increase the chance of a heart attack or stroke. The chance may increase with longer use of this medicine and in people who have heart disease. If you take aspirin to prevent heart attack or stroke, talk with your doctor or health care professional. Do not take medicines such as ibuprofen and naproxen with this medicine. Side effects such as stomach upset, nausea, or ulcers may be more likely to occur. Many medicines available without a prescription should not be taken with this medicine. This medicine can cause ulcers and bleeding in the stomach and intestines at any time during treatment. Do not smoke cigarettes or drink  alcohol. These increase irritation to your stomach and can make it more susceptible to damage from this medicine. Ulcers and bleeding can happen without warning symptoms and can cause death. You may get drowsy or dizzy. Do not drive, use machinery, or do anything that needs mental alertness until you know how this medicine affects you. Do not stand or sit up quickly, especially if you are an older patient. This reduces the risk of dizzy or fainting spells. This medicine can cause you to bleed more easily. Try to avoid damage to your teeth and gums when you brush or floss your teeth. What side effects may I notice from receiving this medicine? Side effects that you should report to your doctor or health care professional as soon as possible: -allergic reactions like skin rash, itching or hives, swelling of the face, lips, or tongue -difficulty breathing or wheezing -nausea, vomiting -signs and symptoms of bleeding such as bloody or black, tarry stools; red or dark-brown urine; spitting up blood or brown material that looks like coffee grounds; red spots on the skin; unusual bruising or bleeding from the eye, gums, or nose -signs and symptoms of a blood clot such as changes in vision; chest pain; severe, sudden headache; trouble speaking; sudden numbness or weakness of the face, arm, or leg; trouble walking -unexplained weight gain or swelling -unusually weak or tired -yellowing of eyes or skin Side effects that usually do not require medical attention (report to your doctor or health care professional if they continue or are bothersome): -diarrhea -dizziness -headache -heartburn This list may not describe all possible side effects. Call your doctor for medical advice about side effects. You may report side effects to FDA at 1-800-FDA-1088. Where should I keep my medicine? Keep out of the reach of children. Store at room temperature between 15 and 30 degrees C (59 and 86 degrees F). Keep container  tightly closed. Throw away any unused medicine after the expiration date. NOTE: This sheet is a summary. It may not cover all possible information. If you have questions about this medicine, talk to your doctor, pharmacist, or health care provider.  2015, [employer]. (2013-03-23 15:28:44)  Acetaminophen; Oxycodone tablets What is this medicine? ACETAMINOPHEN; OXYCODONE (a set a MEE noe fen; ox i KOE done) is a pain reliever. It is used to treat mild to moderate pain. This medicine may be used for other purposes; ask your health care provider or pharmacist if you have questions. COMMON BRAND NAME(S): Endocet, Magnacet, Narvox, Percocet, Perloxx, Primalev, Primlev, Roxicet, Xolox What should I tell my health care provider before I take this medicine? They need to know if you have any of these conditions: -brain tumor -Crohn's disease, inflammatory bowel disease, or ulcerative colitis -drug abuse or addiction -head injury -heart or circulation problems -if you often drink alcohol -kidney disease or problems going to the  bathroom -liver disease -lung disease, asthma, or breathing problems -an unusual or allergic reaction to acetaminophen, oxycodone, other opioid analgesics, other medicines, foods, dyes, or preservatives -pregnant or trying to get pregnant -breast-feeding How should I use this medicine? Take this medicine by mouth with a full glass of water. Follow the directions on the prescription label. Take your medicine at regular intervals. Do not take your medicine more often than directed. Talk to your pediatrician regarding the use of this medicine in children. Special care may be needed. Patients over 42 years old may have a stronger reaction and need a smaller dose. Overdosage: If you think you have taken too much of this medicine contact a poison control center or emergency room at once. NOTE: This medicine is only for you. Do not share this medicine with others. What  if I miss a dose? If you miss a dose, take it as soon as you can. If it is almost time for your next dose, take only that dose. Do not take double or extra doses. What may interact with this medicine? -alcohol -antihistamines -barbiturates like amobarbital, butalbital, butabarbital, methohexital, pentobarbital, phenobarbital, thiopental, and secobarbital -benztropine -drugs for bladder problems like solifenacin, trospium, oxybutynin, tolterodine, hyoscyamine, and methscopolamine -drugs for breathing problems like ipratropium and tiotropium -drugs for certain stomach or intestine problems like propantheline, homatropine methylbromide, glycopyrrolate, atropine, belladonna, and dicyclomine -general anesthetics like etomidate, ketamine, nitrous oxide, propofol, desflurane, enflurane, halothane, isoflurane, and sevoflurane -medicines for depression, anxiety, or psychotic disturbances -medicines for sleep -muscle relaxants -naltrexone -narcotic medicines (opiates) for pain -phenothiazines like perphenazine, thioridazine, chlorpromazine, mesoridazine, fluphenazine, prochlorperazine, promazine, and trifluoperazine -scopolamine -tramadol -trihexyphenidyl This list may not describe all possible interactions. Give your health care provider a list of all the medicines, herbs, non-prescription drugs, or dietary supplements you use. Also tell them if you smoke, drink alcohol, or use illegal drugs. Some items may interact with your medicine. What should I watch for while using this medicine? Tell your doctor or health care professional if your pain does not go away, if it gets worse, or if you have new or a different type of pain. You may develop tolerance to the medicine. Tolerance means that you will need a higher dose of the medication for pain relief. Tolerance is normal and is expected if you take this medicine for a long time. Do not suddenly stop taking your medicine because you may develop a severe  reaction. Your body becomes used to the medicine. This does NOT mean you are addicted. Addiction is a behavior related to getting and using a drug for a non-medical reason. If you have pain, you have a medical reason to take pain medicine. Your doctor will tell you how much medicine to take. If your doctor wants you to stop the medicine, the dose will be slowly lowered over time to avoid any side effects. You may get drowsy or dizzy. Do not drive, use machinery, or do anything that needs mental alertness until you know how this medicine affects you. Do not stand or sit up quickly, especially if you are an older patient. This reduces the risk of dizzy or fainting spells. Alcohol may interfere with the effect of this medicine. Avoid alcoholic drinks. There are different types of narcotic medicines (opiates) for pain. If you take more than one type at the same time, you may have more side effects. Give your health care provider a list of all medicines you use. Your doctor will tell you how much medicine to  take. Do not take more medicine than directed. Call emergency for help if you have problems breathing. The medicine will cause constipation. Try to have a bowel movement at least every 2 to 3 days. If you do not have a bowel movement for 3 days, call your doctor or health care professional. Do not take Tylenol (acetaminophen) or medicines that have acetaminophen with this medicine. Too much acetaminophen can be very dangerous. Many nonprescription medicines contain acetaminophen. Always read the labels carefully to avoid taking more acetaminophen. What side effects may I notice from receiving this medicine? Side effects that you should report to your doctor or health care professional as soon as possible: -allergic reactions like skin rash, itching or hives, swelling of the face, lips, or tongue -breathing difficulties, wheezing -confusion -light headedness or fainting spells -severe stomach  pain -unusually weak or tired -yellowing of the skin or the whites of the eyes Side effects that usually do not require medical attention (report to your doctor or health care professional if they continue or are bothersome): -dizziness -drowsiness -nausea -vomiting This list may not describe all possible side effects. Call your doctor for medical advice about side effects. You may report side effects to FDA at 1-800-FDA-1088. Where should I keep my medicine? Keep out of the reach of children. This medicine can be abused. Keep your medicine in a safe place to protect it from theft. Do not share this medicine with anyone. Selling or giving away this medicine is dangerous and against the law. Store at room temperature between 20 and 25 degrees C (68 and 77 degrees F). Keep container tightly closed. Protect from light. This medicine may cause accidental overdose and death if it is taken by other adults, children, or pets. Flush any unused medicine down the toilet to reduce the chance of harm. Do not use the medicine after the expiration date. NOTE: This sheet is a summary. It may not cover all possible information. If you have questions about this medicine, talk to your doctor, pharmacist, or health care provider.  2015, Elsevier/Gold Standard. (2013-06-28 13:17:35)

## 2014-10-04 NOTE — ED Notes (Signed)
Dr. Glick at the bedside.  
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
## 2014-10-04 NOTE — ED Provider Notes (Signed)
CSN: [id number]     Arrival date & time 10/04/14  0155 History   First MD Initiated Contact with Patient 10/04/14 0328     Chief Complaint  Patient presents with  . Leg Pain     (Consider location/radiation/quality/duration/timing/severity/associated sxs/prior Treatment) Patient is a 48 y.o. male presenting with leg pain. The history is provided by the patient.  Leg Pain He had onset this evening of severe pain in his right foot. Pain is worse with any movement or palpation. He rates pain at 10/10. He describes the pain as a sharp pain or denies any trauma. He has not taken anything for pain.  Past Medical History  Diagnosis Date  . HIV (human immunodeficiency virus infection)   . Hepatitis B   . Incarceration    Past Surgical History  Procedure Laterality Date  . I&d extremity  05/21/2012    Procedure: IRRIGATION AND DEBRIDEMENT EXTREMITY;  Surgeon: Javier DockerJeffrey C Beane, MD;  Location: [facility] OR;  Service: Orthopedics;  Laterality: Right;   History reviewed. No pertinent family history. History  Substance Use Topics  . Smoking status: Current Every Day Smoker -- 1.50 packs/day  . Smokeless tobacco: Never Used  . Alcohol Use: 1.0 oz/week    2 drink(s) per week     Comment: occassional    Review of Systems  All other systems reviewed and are negative.     Allergies  Review of patient's allergies indicates no known allergies.  Home Medications   Prior to Admission medications   Medication Sig Start Date End Date Taking? Authorizing Provider  darunavir-cobicistat (PREZCOBIX) 800-150 MG per tablet Take 1 tablet by mouth daily. 04/01/14  Yes Gardiner Barefootobert W Comer, MD  emtricitabine-tenofovir (TRUVADA) 200-300 MG per tablet Take 1 tablet by mouth daily. 04/01/14 04/01/15 Yes Gardiner Barefootobert W Comer, MD  diclofenac (CATAFLAM) 50 MG tablet Take 1 tablet (50 mg total) by mouth 3 (three) times daily. For wrist pain Patient not taking: Reported on 10/04/2014 03/22/14   Linna HoffJames D Kindl, MD  Gabapentin, PHN,  300 MG TABS Take 300 mg by mouth at bedtime as needed. Patient not taking: Reported on 10/04/2014 06/30/13   Randall Hissornelius N Van Dam, MD  testosterone cypionate (DEPOTESTOTERONE CYPIONATE) 200 MG/ML injection Inject 0.5 mLs (100 mg total) into the muscle every 14 (fourteen) days. Patient not taking: Reported on 10/04/2014 04/14/13   Randall Hissornelius N Van Dam, MD  traMADol (ULTRAM) 50 MG tablet Take 1 tablet (50 mg total) by mouth at bedtime as needed for pain. Patient not taking: Reported on 10/04/2014 08/30/13   Randall Hissornelius N Van Dam, MD   BP 142/94 mmHg  Pulse 73  Temp(Src) 98.9 F (37.2 C) (Oral)  Resp 19  Ht 5\' 10"  (1.778 m)  Wt 157 lb (71.215 kg)  BMI 22.53 kg/m2  SpO2 95% Physical Exam  Nursing note and vitals reviewed.  48 year old male, in obvious pain, but in no acute distress. Vital signs are significant for borderline hypertension. Oxygen saturation is 95%, which is normal. Head is normocephalic and atraumatic. PERRLA, EOMI. Oropharynx is clear. Neck is nontender and supple without adenopathy or JVD. Back is nontender and there is no CVA tenderness. Lungs are clear without rales, wheezes, or rhonchi. Chest is nontender. Heart has regular rate and rhythm without murmur. Abdomen is soft, flat, nontender without masses or hepatosplenomegaly and peristalsis is normoactive. Extremities: there is mild swelling and warmth around the right first MTP joint with marked tenderness in this area. Overall picture is consistent with acute  gouty arthritis. Skin is warm and dry without rash. Neurologic: Mental status is normal, cranial nerves are intact, there are no motor or sensory deficits.  ED Course  Procedures (including critical care time) Labs Review Results for orders placed or performed during the hospital encounter of 10/04/14  Uric acid  Result Value Ref Range   Uric Acid, Serum 3.1 (L) 4.0 - 7.8 mg/dL     MDM   Final diagnoses:  Podagra    Acute gouty arthritis involving the  right first MTP joint. Old records are reviewed and I do not see any other visits for arthritic complaints. Uric acid level will be checked and he is given oxycodone and acetaminophen for pain and is started on indomethacin. Prior to starting indomethacin, I did check his most recent creatinine and it was normal.  Here Curtis Hodge was asked to come back low. This does not mean he does not have gout does make it less likely. Clinically, the presentation is still most consistent with gout. He is discharged with prescriptions for prednisone, indomethacin, and oxycodone acetaminophen and is referred back to his PCP.  Dione Boozeavid Chestina Komatsu, MD 10/04/14 [phone]

## 2014-10-05 ENCOUNTER — Ambulatory Visit: Payer: Self-pay | Admitting: Infectious Disease

## 2015-02-07 ENCOUNTER — Encounter (HOSPITAL_COMMUNITY): Payer: Self-pay | Admitting: Emergency Medicine

## 2015-02-07 ENCOUNTER — Emergency Department (HOSPITAL_COMMUNITY)
Admission: EM | Admit: 2015-02-07 | Discharge: 2015-02-08 | Disposition: A | Discharge status: Home / Self Care | Payer: Self-pay | Attending: Emergency Medicine | Admitting: Emergency Medicine

## 2015-02-07 ENCOUNTER — Emergency Department (HOSPITAL_COMMUNITY): Payer: Self-pay

## 2015-02-07 DIAGNOSIS — Z7952 Long term (current) use of systemic steroids: Secondary | ICD-10-CM | POA: Insufficient documentation

## 2015-02-07 DIAGNOSIS — R531 Weakness: Secondary | ICD-10-CM | POA: Insufficient documentation

## 2015-02-07 DIAGNOSIS — J439 Emphysema, unspecified: Secondary | ICD-10-CM | POA: Insufficient documentation

## 2015-02-07 DIAGNOSIS — R071 Chest pain on breathing: Secondary | ICD-10-CM

## 2015-02-07 DIAGNOSIS — Z79899 Other long term (current) drug therapy: Secondary | ICD-10-CM | POA: Insufficient documentation

## 2015-02-07 DIAGNOSIS — R079 Chest pain, unspecified: Secondary | ICD-10-CM

## 2015-02-07 DIAGNOSIS — Z8619 Personal history of other infectious and parasitic diseases: Secondary | ICD-10-CM | POA: Insufficient documentation

## 2015-02-07 DIAGNOSIS — F1210 Cannabis abuse, uncomplicated: Secondary | ICD-10-CM | POA: Insufficient documentation

## 2015-02-07 DIAGNOSIS — Z21 Asymptomatic human immunodeficiency virus [HIV] infection status: Secondary | ICD-10-CM | POA: Insufficient documentation

## 2015-02-07 DIAGNOSIS — R0789 Other chest pain: Secondary | ICD-10-CM | POA: Insufficient documentation

## 2015-02-07 DIAGNOSIS — Z720 Tobacco use: Secondary | ICD-10-CM | POA: Insufficient documentation

## 2015-02-07 LAB — RAPID URINE DRUG SCREEN, HOSP PERFORMED
Amphetamines: NOT DETECTED
Barbiturates: NOT DETECTED
Benzodiazepines: NOT DETECTED
Cocaine: NOT DETECTED
Opiates: NOT DETECTED
Tetrahydrocannabinol: POSITIVE — AB

## 2015-02-07 LAB — CBC
HEMATOCRIT: 37.2 % — AB (ref 39.0–52.0)
HEMOGLOBIN: 12.8 g/dL — AB (ref 13.0–17.0)
MCH: 32.5 pg (ref 26.0–34.0)
MCHC: 34.4 g/dL (ref 30.0–36.0)
MCV: 94.4 fL (ref 78.0–100.0)
Platelets: 152 10*3/uL (ref 150–400)
RBC: 3.94 MIL/uL — ABNORMAL LOW (ref 4.22–5.81)
RDW: 13.1 % (ref 11.5–15.5)
WBC: 4.5 10*3/uL (ref 4.0–10.5)

## 2015-02-07 LAB — BASIC METABOLIC PANEL
Anion gap: 4 — ABNORMAL LOW (ref 5–15)
BUN: 7 mg/dL (ref 6–23)
CALCIUM: 8.3 mg/dL — AB (ref 8.4–10.5)
CO2: 26 mmol/L (ref 19–32)
Chloride: 106 mmol/L (ref 96–112)
Creatinine, Ser: 1.23 mg/dL (ref 0.50–1.35)
GFR calc Af Amer: 79 mL/min — ABNORMAL LOW (ref >90)
GFR, EST NON AFRICAN AMERICAN: 68 mL/min — AB (ref >90)
GLUCOSE: 84 mg/dL (ref 70–99)
Potassium: 3.8 mmol/L (ref 3.5–5.1)
Sodium: 136 mmol/L (ref 135–145)

## 2015-02-07 LAB — ETHANOL: Alcohol, Ethyl (B): <5 mg/dL (ref 0–9)

## 2015-02-07 LAB — I-STAT TROPONIN, ED: Troponin i, poc: 0 ng/mL (ref 0.00–0.08)

## 2015-02-07 MED ORDER — SODIUM CHLORIDE 0.9 % IV BOLUS (SEPSIS)
500.0000 mL | Freq: Once | INTRAVENOUS | Status: AC
  Administered 2015-02-07: 500 mL via INTRAVENOUS

## 2015-02-07 MED ORDER — OXYCODONE-ACETAMINOPHEN 5-325 MG PO TABS
1.0000 | ORAL_TABLET | Freq: Once | ORAL | Status: AC
  Administered 2015-02-07: 1 via ORAL
  Filled 2015-02-07: qty 1

## 2015-02-07 NOTE — ED Provider Notes (Signed)
CSN: [id number]     Arrival date & time 02/07/15  1949 History   First MD Initiated Contact with Patient 02/07/15 2037     Chief Complaint  Patient presents with  . Chest Pain     (Consider location/radiation/quality/duration/timing/severity/associated sxs/prior Treatment) HPI Pt is a 49yo male with hx of HIV and Hep B, presenting to ED with c/o sudden onset left sided chest pain that started around 2PM while he was walking down the street.  Pt states pain has been constant but is more aching and sore when he is at rest. Pain was 10/10 at worst.  Pain is worse with movement and palpation.  Pain 7/10 upon arrival to ED. Pt had 324mg  ASA and 2 Nitro PTA w/o relief. Pt denies previous hx of CAD or other heart problems but per medical records, pt had similar chest pain described as an elephant sitting on his chest back in June 2015.    Past Medical History  Diagnosis Date  . HIV (human immunodeficiency virus infection)   . Hepatitis B   . Incarceration    Past Surgical History  Procedure Laterality Date  . I&d extremity  05/21/2012    Procedure: IRRIGATION AND DEBRIDEMENT EXTREMITY;  Surgeon: Javier DockerJeffrey C Beane, MD;  Location: [facility] OR;  Service: Orthopedics;  Laterality: Right;   No family history on file. History  Substance Use Topics  . Smoking status: Current Every Day Smoker -- 1.50 packs/day  . Smokeless tobacco: Never Used  . Alcohol Use: 1.0 oz/week    2 drink(s) per week     Comment: occassional    Review of Systems  Constitutional: Negative for fever, chills, diaphoresis and fatigue.  Respiratory: Negative for cough and shortness of breath.   Cardiovascular: Positive for chest pain.  Gastrointestinal: Negative for nausea, vomiting, abdominal pain and diarrhea.  Neurological: Positive for weakness. Negative for speech difficulty and headaches.  All other systems reviewed and are negative.     Allergies  Review of patient's allergies indicates no known allergies.  Home  Medications   Prior to Admission medications   Medication Sig Start Date End Date Taking? Authorizing Provider  darunavir-cobicistat (PREZCOBIX) 800-150 MG per tablet Take 1 tablet by mouth daily. 04/01/14  Yes Gardiner Barefootobert W Comer, MD  emtricitabine-tenofovir (TRUVADA) 200-300 MG per tablet Take 1 tablet by mouth daily. 04/01/14 04/01/15 Yes Gardiner Barefootobert W Comer, MD  diclofenac (CATAFLAM) 50 MG tablet Take 1 tablet (50 mg total) by mouth 3 (three) times daily. For wrist pain Patient not taking: Reported on 10/04/2014 03/22/14   Linna HoffJames D Kindl, MD  Gabapentin, PHN, 782300 MG TABS Take 300 mg by mouth at bedtime as needed. Patient not taking: Reported on 10/04/2014 06/30/13   Randall Hissornelius N Van Dam, MD  indomethacin (INDOCIN) 50 MG capsule Take 1 capsule (50 mg total) by mouth 3 (three) times daily as needed. 10/04/14   Dione Boozeavid Glick, MD  oxyCODONE-acetaminophen (PERCOCET) 5-325 MG per tablet Take 1 tablet by mouth every 4 (four) hours as needed for moderate pain. 10/04/14   Dione Boozeavid Glick, MD  predniSONE (DELTASONE) 50 MG tablet Take 1 tablet (50 mg total) by mouth daily. 10/04/14   Dione Boozeavid Glick, MD  testosterone cypionate (DEPOTESTOTERONE CYPIONATE) 200 MG/ML injection Inject 0.5 mLs (100 mg total) into the muscle every 14 (fourteen) days. Patient not taking: Reported on 10/04/2014 04/14/13   Randall Hissornelius N Van Dam, MD  traMADol (ULTRAM) 50 MG tablet Take 1 tablet (50 mg total) by mouth at bedtime as needed for pain.  Patient not taking: Reported on 10/04/2014 08/30/13   Randall Hiss, MD   BP 135/99 mmHg  Pulse 86  Temp(Src) 99.5 F (37.5 C) (Oral)  Resp 30  SpO2 99% Physical Exam  Constitutional: He appears well-developed and well-nourished.  Pt lying comfortably in exam bed, NAD.   HENT:  Head: Normocephalic and atraumatic.  Eyes: Conjunctivae are normal. No scleral icterus.  Neck: Normal range of motion.  Cardiovascular: Normal rate, regular rhythm and normal heart sounds.   Pulmonary/Chest: Effort normal and  breath sounds normal. No respiratory distress. He has no wheezes. He has no rales. He exhibits tenderness ( left anterior chest wall).  No respiratory distress. Lungs: CTAB. Increased chest pain with pt leaning forward.  Tenderness over left anterior chest w/o crepitus. No flail chest.   Abdominal: Soft. Bowel sounds are normal. He exhibits no distension and no mass. There is no tenderness. There is no rebound and no guarding.  Musculoskeletal: Normal range of motion.  Neurological: He is alert.  Skin: Skin is warm and dry.  Nursing note and vitals reviewed.   ED Course  Procedures (including critical care time) Labs Review Labs Reviewed  BASIC METABOLIC PANEL - Abnormal; Notable for the following:    Calcium 8.3 (*)    GFR calc non Af Amer 68 (*)    GFR calc Af Amer 79 (*)    Anion gap 4 (*)    All other components within normal limits  CBC - Abnormal; Notable for the following:    RBC 3.94 (*)    Hemoglobin 12.8 (*)    HCT 37.2 (*)    All other components within normal limits  URINE RAPID DRUG SCREEN (HOSP PERFORMED) - Abnormal; Notable for the following:    Tetrahydrocannabinol POSITIVE (*)    All other components within normal limits  ETHANOL  I-STAT TROPOININ, ED    Imaging Review Dg Chest 2 View  02/07/2015   CLINICAL DATA:  Acute onset of mid chest pain, radiating to the back, with shortness of breath. Pain radiates down the left arm. Initial encounter.  EXAM: CHEST  2 VIEW  COMPARISON:  None.  FINDINGS: The lungs are well-aerated. Prominent bulla are noted at the lung apices and adjacent to the hila. There is no evidence of focal opacification, pleural effusion or pneumothorax.  The heart is normal in size; the mediastinal contour is within normal limits. No acute osseous abnormalities are seen.  IMPRESSION: Prominent bulla noted.  No acute cardiopulmonary process seen.   Electronically Signed   By: Roanna Raider M.D.   On: 02/07/2015 22:19     EKG  Interpretation   Date/Time:  Tuesday February 07 2015 19:57:34 EDT Ventricular Rate:  76 PR Interval:  144 QRS Duration: 90 QT Interval:  388 QTC Calculation: 436 R Axis:   83 Text Interpretation:  Sinus arrhythmia Ventricular premature complex ST  elev, probable normal early repol pattern Confirmed by Lincoln Brigham [phone])  on 02/07/2015 9:50:08 PM      MDM   Final diagnoses:  Left sided chest pain    Pt is a 49yo male with hx of HIV and Hep B, presenting to ED with c/o sudden onset Left sided chest pain that is worse with palpation and certain movements including leaning forward.   EKG: no significant change since previous.   istat troponin: negative for elevation. CBC and BMP: unremarkable, c/w previous UDS: positive for marijuana, otherwise negative  CXR: prominent bulla noted, no acute cardiopulmonary process.  Discussed pt with Dr. Madilyn Hook who also examined pt and reviewed CXR, concern for possible small pneumothorax or rupture of a bulla. Will get CT angio chest for more detailed evaluation of lungs to help determine possible cause of pt's severe pain.   Pt signed out to [employer], PA-C at shift change. Plan is to f/u on CT angio chest. If unremarkable, pt may be discharged home with pain medication and to f/u with ID.  If acute findings, consult and dispo appropriately.    Junius Finner, PA-C 02/08/15 0025  Tilden Fossa, MD 02/08/15 [phone]

## 2015-02-07 NOTE — ED Notes (Signed)
Gave pt Malaw[place]key sandwich and water with PA [employer]rin approval.
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
## 2015-02-07 NOTE — ED Notes (Signed)
Pt unable to urinate at this time, urinal at bedside 

## 2015-02-07 NOTE — ED Notes (Signed)
MD Rees at bedside. 

## 2015-02-08 ENCOUNTER — Emergency Department (HOSPITAL_COMMUNITY): Payer: Self-pay

## 2015-02-08 ENCOUNTER — Encounter (HOSPITAL_COMMUNITY): Payer: Self-pay

## 2015-02-08 MED ORDER — OXYCODONE-ACETAMINOPHEN 5-325 MG PO TABS: 1.0000 | ORAL_TABLET | Freq: Four times a day (QID) | ORAL | Status: DC | PRN

## 2015-02-08 MED ORDER — IOHEXOL 350 MG/ML SOLN
100.0000 mL | Freq: Once | INTRAVENOUS | Status: AC | PRN
  Administered 2015-02-08: 100 mL via INTRAVENOUS

## 2015-02-08 NOTE — ED Notes (Signed)
PA Kelly at bedside. 

## 2015-02-08 NOTE — Discharge Instructions (Signed)
Recommend that you follow-up with your primary care doctor as well as a pulmonologist and cardiothoracic surgeon to discuss further treatment options and management. You may take Percocet as needed for pain as prescribed. Return to the emergency department as needed if symptoms worsen.  Chronic Obstructive Pulmonary Disease Chronic obstructive pulmonary disease (COPD) is a common lung condition in which airflow from the lungs is limited. COPD is a general term that can be used to describe many different lung problems that limit airflow, including both chronic bronchitis and emphysema. If you have COPD, your lung function will probably never return to normal, but there are measures you can take to improve lung function and make yourself feel better.  CAUSES   Smoking (common).   Exposure to secondhand smoke.   Genetic problems.  Chronic inflammatory lung diseases or recurrent infections. SYMPTOMS   Shortness of breath, especially with physical activity.   Deep, persistent (chronic) cough with a large amount of thick mucus.   Wheezing.   Rapid breaths (tachypnea).   Gray or bluish discoloration (cyanosis) of the skin, especially in fingers, toes, or lips.   Fatigue.   Weight loss.   Frequent infections or episodes when breathing symptoms become much worse (exacerbations).   Chest tightness. DIAGNOSIS  Your health care provider will take a medical history and perform a physical examination to make the initial diagnosis. Additional tests for COPD may include:   Lung (pulmonary) function tests.  Chest X-ray.  CT scan.  Blood tests. TREATMENT  Treatment available to help you feel better when you have COPD includes:   Inhaler and nebulizer medicines. These help manage the symptoms of COPD and make your breathing more comfortable.  Supplemental oxygen. Supplemental oxygen is only helpful if you have a low oxygen level in your blood.   Exercise and physical  activity. These are beneficial for nearly all people with COPD. Some people may also benefit from a pulmonary rehabilitation program. HOME CARE INSTRUCTIONS   Take all medicines (inhaled or pills) as directed by your health care provider.  Avoid over-the-counter medicines or cough syrups that dry up your airway (such as antihistamines) and slow down the elimination of secretions unless instructed otherwise by your health care provider.   If you are a smoker, the most important thing that you can do is stop smoking. Continuing to smoke will cause further lung damage and breathing trouble. Ask your health care provider for help with quitting smoking. He or she can direct you to community resources or hospitals that provide support.  Avoid exposure to irritants such as smoke, chemicals, and fumes that aggravate your breathing.  Use oxygen therapy and pulmonary rehabilitation if directed by your health care provider. If you require home oxygen therapy, ask your health care provider whether you should purchase a pulse oximeter to measure your oxygen level at home.   Avoid contact with individuals who have a contagious illness.  Avoid extreme temperature and humidity changes.  Eat healthy foods. Eating smaller, more frequent meals and resting before meals may help you maintain your strength.  Stay active, but balance activity with periods of rest. Exercise and physical activity will help you maintain your ability to do things you want to do.  Preventing infection and hospitalization is very important when you have COPD. Make sure to receive all the vaccines your health care provider recommends, especially the pneumococcal and influenza vaccines. Ask your health care provider whether you need a pneumonia vaccine.  Learn and use relaxation techniques  to manage stress.  Learn and use controlled breathing techniques as directed by your health care provider. Controlled breathing techniques include:    Pursed lip breathing. Start by breathing in (inhaling) through your nose for 1 second. Then, purse your lips as if you were going to whistle and breathe out (exhale) through the pursed lips for 2 seconds.   Diaphragmatic breathing. Start by putting one hand on your abdomen just above your waist. Inhale slowly through your nose. The hand on your abdomen should move out. Then purse your lips and exhale slowly. You should be able to feel the hand on your abdomen moving in as you exhale.   Learn and use controlled coughing to clear mucus from your lungs. Controlled coughing is a series of short, progressive coughs. The steps of controlled coughing are:  1. Lean your head slightly forward.  2. Breathe in deeply using diaphragmatic breathing.  3. Try to hold your breath for 3 seconds.  4. Keep your mouth slightly open while coughing twice.  5. Spit any mucus out into a tissue.  6. Rest and repeat the steps once or twice as needed. SEEK MEDICAL CARE IF:   You are coughing up more mucus than usual.   There is a change in the color or thickness of your mucus.   Your breathing is more labored than usual.   Your breathing is faster than usual.  SEEK IMMEDIATE MEDICAL CARE IF:   You have shortness of breath while you are resting.   You have shortness of breath that prevents you from:  Being able to talk.   Performing your usual physical activities.   You have chest pain lasting longer than 5 minutes.   Your skin color is more cyanotic than usual.  You measure low oxygen saturations for longer than 5 minutes with a pulse oximeter. MAKE SURE YOU:   Understand these instructions.  Will watch your condition.  Will get help right away if you are not doing well or get worse. Document Released: 08/14/2005 Document Revised: 03/21/2014 Document Reviewed: 07/01/2013 [facility] Patient Information 2015 [employer] [place]. This information is not intended to replace advice given to  you by your health care provider. Make sure you discuss any questions you have with your health care provider.

## 2015-02-08 NOTE — ED Provider Notes (Signed)
[id number] - Patient care assumed from [facility], PA-C at shift change. Patient pending CT scan for further evaluation of chest pain. His chest x-ray showed multiple blebs likely c/w emphysema. His CT was performed to evaluate for PE and PTX, both of which are NOT seen on CT imaging. Findings c/w moderate to severe bullous emphysema. Patient has no signs of respiratory distress in the emergency department. He has had no hypoxia. Will discharge at this time with pain medication for symptomatic relief and referrals to both CT surgery and pulmonology. Patient also advised to follow-up with his primary care doctor. Return precautions given. Patient agreeable to plan with no unaddressed concerns.   Filed Vitals:   02/07/15 2330 02/08/15 0100 02/08/15 0130 02/08/15 0200  BP: 135/99 108/55 116/72 131/72  Pulse: 86 63 65 57  Temp:      TempSrc:      Resp: SpO2: 99% 97% 100% 99%    Results for orders placed or performed during the hospital encounter of 02/07/15  Basic metabolic panel  Result Value Ref Range   Sodium 136 135 - 145 mmol/L   Potassium 3.8 3.5 - 5.1 mmol/L   Chloride 106 96 - 112 mmol/L   CO2 26 19 - 32 mmol/L   Glucose, Bld 84 70 - 99 mg/dL   BUN 7 6 - 23 mg/dL   Creatinine, Ser 1.47 0.50 - 1.35 mg/dL   Calcium 8.3 (L) 8.4 - 10.5 mg/dL   GFR calc non Af Amer 68 (L) >90 mL/min   GFR calc Af Amer 79 (L) >90 mL/min   Anion gap 4 (L) 5 - 15  CBC  Result Value Ref Range   WBC 4.5 4.0 - 10.5 K/uL   RBC 3.94 (L) 4.22 - 5.81 MIL/uL   Hemoglobin 12.8 (L) 13.0 - 17.0 g/dL   HCT 82.9 (L) 56.2 - 13.0 %   MCV 94.4 78.0 - 100.0 fL   MCH 32.5 26.0 - 34.0 pg   MCHC 34.4 30.0 - 36.0 g/dL   RDW 86.5 78.4 - 69.6 %   Platelets 152 150 - 400 K/uL  Drug screen panel, emergency  Result Value Ref Range   Opiates NONE DETECTED NONE DETECTED   Cocaine NONE DETECTED NONE DETECTED   Benzodiazepines NONE DETECTED NONE DETECTED   Amphetamines NONE DETECTED NONE DETECTED   Tetrahydrocannabinol POSITIVE (A) NONE DETECTED   Barbiturates NONE DETECTED NONE DETECTED  Ethanol  Result Value Ref Range   Alcohol, Ethyl (B) <5 0 - 9 mg/dL  I-stat troponin, ED (not at [facility])  Result Value Ref Range   Troponin i, poc 0.00 0.00 - 0.08 ng/mL   Comment 3           Dg Chest 2 View  02/07/2015   CLINICAL DATA:  Acute onset of mid chest pain, radiating to the back, with shortness of breath. Pain radiates down the left arm. Initial encounter.  EXAM: CHEST  2 VIEW  COMPARISON:  None.  FINDINGS: The lungs are well-aerated. Prominent bulla are noted at the lung apices and adjacent to the hila. There is no evidence of focal opacification, pleural effusion or pneumothorax.  The heart is normal in size; the mediastinal contour is within normal limits. No acute osseous abnormalities are seen.  IMPRESSION: Prominent bulla noted.  No acute cardiopulmonary process seen.   Electronically Signed   By: Roanna Raider M.D.   On: 02/07/2015 22:19   Ct Angio Chest Pe W/cm &/or Wo  Cm  02/08/2015   CLINICAL DATA:  Sudden onset of left-sided chest pain, worse in with deep breathing. Shortness of breath.  EXAM: CT ANGIOGRAPHY CHEST WITH CONTRAST  TECHNIQUE: Multidetector CT imaging of the chest was performed using the standard protocol during bolus administration of intravenous contrast. Multiplanar CT image reconstructions and MIPs were obtained to evaluate the vascular anatomy.  CONTRAST:  100mL OMNIPAQUE IOHEXOL 350 MG/ML SOLN  COMPARISON:  Chest radiographs 4 hours prior.  FINDINGS: There are no filling defects within the pulmonary arteries to suggest pulmonary embolus. Evaluation of the lower lobe subsegmental arteries is partially obscured by breathing motion artifact.  The thoracic aorta is normal in caliber with mild atherosclerosis. No pleural or pericardial effusion. Small bilateral hilar lymph nodes without overt adenopathy. No mediastinal adenopathy.  Moderately severe bullous emphysema with large  bulla in the right lung apex. Large bulla in the lingula and left upper lobe. No consolidation. No pulmonary nodule or mass. No pneumothorax.  No definite acute abnormality in the included upper abdomen, paucity of intra-abdominal fat limits evaluation. There are no acute or suspicious osseous abnormalities.  Review of the MIP images confirms the above findings.  IMPRESSION: 1. No pulmonary embolus. 2. Moderately severe bullous emphysema.   Electronically Signed   By: Rubye OaksMelanie  Ehinger M.D.   On: 02/08/2015 01:52      Antony MaduraKelly Mayo Owczarzak, PA-C 02/08/15 0231  Marisa Severinlga Otter, MD 02/09/15 [phone]

## 2015-02-09 ENCOUNTER — Encounter: Payer: Self-pay | Admitting: Infectious Disease

## 2015-02-09 ENCOUNTER — Ambulatory Visit (INDEPENDENT_AMBULATORY_CARE_PROVIDER_SITE_OTHER): Payer: Self-pay | Admitting: Infectious Disease

## 2015-02-09 VITALS — BP 113/78 | HR 83 | Temp 98.0°F | Wt 152.0 lb

## 2015-02-09 DIAGNOSIS — E291 Testicular hypofunction: Secondary | ICD-10-CM

## 2015-02-09 DIAGNOSIS — F17200 Nicotine dependence, unspecified, uncomplicated: Secondary | ICD-10-CM

## 2015-02-09 DIAGNOSIS — R7989 Other specified abnormal findings of blood chemistry: Secondary | ICD-10-CM

## 2015-02-09 DIAGNOSIS — B20 Human immunodeficiency virus [HIV] disease: Secondary | ICD-10-CM

## 2015-02-09 DIAGNOSIS — Z79899 Other long term (current) drug therapy: Secondary | ICD-10-CM

## 2015-02-09 DIAGNOSIS — B181 Chronic viral hepatitis B without delta-agent: Secondary | ICD-10-CM

## 2015-02-09 DIAGNOSIS — Z9119 Patient's noncompliance with other medical treatment and regimen: Secondary | ICD-10-CM

## 2015-02-09 DIAGNOSIS — Z91199 Patient's noncompliance with other medical treatment and regimen due to unspecified reason: Secondary | ICD-10-CM

## 2015-02-09 DIAGNOSIS — J439 Emphysema, unspecified: Secondary | ICD-10-CM

## 2015-02-09 DIAGNOSIS — Z720 Tobacco use: Secondary | ICD-10-CM

## 2015-02-09 DIAGNOSIS — R0789 Other chest pain: Secondary | ICD-10-CM

## 2015-02-09 DIAGNOSIS — Z113 Encounter for screening for infections with a predominantly sexual mode of transmission: Secondary | ICD-10-CM

## 2015-02-09 LAB — CBC WITH DIFFERENTIAL/PLATELET
BASOS ABS: 0 10*3/uL (ref 0.0–0.1)
Basophils Relative: 0 % (ref 0–1)
EOS ABS: 0 10*3/uL (ref 0.0–0.7)
Eosinophils Relative: 0 % (ref 0–5)
HCT: 40 % (ref 39.0–52.0)
HEMOGLOBIN: 14 g/dL (ref 13.0–17.0)
LYMPHS ABS: 0.6 10*3/uL — AB (ref 0.7–4.0)
LYMPHS PCT: 18 % (ref 12–46)
MCH: 32.6 pg (ref 26.0–34.0)
MCHC: 35 g/dL (ref 30.0–36.0)
MCV: 93 fL (ref 78.0–100.0)
MPV: 11 fL (ref 8.6–12.4)
Monocytes Absolute: 0.1 10*3/uL (ref 0.1–1.0)
Monocytes Relative: 3 % (ref 3–12)
NEUTROS PCT: 79 % — AB (ref 43–77)
Neutro Abs: 2.8 10*3/uL (ref 1.7–7.7)
Platelets: 195 10*3/uL (ref 150–400)
RBC: 4.3 MIL/uL (ref 4.22–5.81)
RDW: 13.6 % (ref 11.5–15.5)
WBC: 3.5 10*3/uL — ABNORMAL LOW (ref 4.0–10.5)

## 2015-02-09 LAB — COMPLETE METABOLIC PANEL WITH GFR
ALT: 168 U/L — ABNORMAL HIGH (ref 0–53)
AST: 99 U/L — ABNORMAL HIGH (ref 0–37)
Albumin: 4.2 g/dL (ref 3.5–5.2)
Alkaline Phosphatase: 113 U/L (ref 39–117)
BUN: 11 mg/dL (ref 6–23)
CALCIUM: 9.1 mg/dL (ref 8.4–10.5)
CHLORIDE: 102 meq/L (ref 96–112)
CO2: 24 mEq/L (ref 19–32)
Creat: 0.98 mg/dL (ref 0.50–1.35)
GLUCOSE: 93 mg/dL (ref 70–99)
Potassium: 4.1 mEq/L (ref 3.5–5.3)
Sodium: 135 mEq/L (ref 135–145)
Total Bilirubin: 0.7 mg/dL (ref 0.2–1.2)
Total Protein: 8.3 g/dL (ref 6.0–8.3)

## 2015-02-09 LAB — LIPID PANEL
CHOLESTEROL: 131 mg/dL (ref 0–200)
HDL: 21 mg/dL — ABNORMAL LOW (ref >=40)
LDL Cholesterol: 97 mg/dL (ref 0–99)
Total CHOL/HDL Ratio: 6.2 Ratio
Triglycerides: 67 mg/dL (ref <150)
VLDL: 13 mg/dL (ref 0–40)

## 2015-02-09 MED ORDER — DARUNAVIR-COBICISTAT 800-150 MG PO TABS: 1.0000 | ORAL_TABLET | Freq: Every day | ORAL | Status: DC

## 2015-02-09 MED ORDER — EMTRICITABINE-TENOFOVIR DF 200-300 MG PO TABS: 1.0000 | ORAL_TABLET | Freq: Every day | ORAL | Status: DC

## 2015-02-09 NOTE — Progress Notes (Signed)
Subjective:    Patient ID: Curtis Hodge, male    DOB: 1966-07-16, 49 y.o.   MRN: [id number]  HPI  HPI   49 year old [place] American man with HIV  That HAD been  much better controlled on prezista, norvir and truvada, then switched to PREZCOBIX and Truvada. Unfortunately he has been out of care for many months having last seen me in June 2015.  He has not renewed his 8 up and has been out of medications for many months. He was seen in emergent department yesterday with left-sided chest pain which sounds musculoskeletal in nature. He had a complete cardiac workup as well as a CT angiogram CT angiogram was significant for no pulmonary embolism but for profound bullous emphysema.     Review of Systems  Constitutional: Negative for fever, chills, diaphoresis, activity change, appetite change, fatigue and unexpected weight change.  HENT: Negative for congestion, rhinorrhea, sinus pressure, sneezing, sore throat and trouble swallowing.   Eyes: Negative for photophobia and visual disturbance.  Respiratory: Positive for shortness of breath. Negative for cough, chest tightness, wheezing and stridor.   Cardiovascular: Positive for chest pain. Negative for palpitations and leg swelling.  Gastrointestinal: Negative for nausea, vomiting, abdominal pain, diarrhea, constipation, blood in stool, abdominal distention and anal bleeding.  Genitourinary: Negative for dysuria, hematuria, flank pain and difficulty urinating.  Musculoskeletal: Negative for myalgias, back pain, joint swelling, arthralgias and gait problem.  Skin: Negative for color change, pallor, rash and wound.  Neurological: Negative for dizziness, tremors, weakness and light-headedness.  Hematological: Negative for adenopathy. Does not bruise/bleed easily.  Psychiatric/Behavioral: Negative for behavioral problems, confusion, sleep disturbance, dysphoric mood, decreased concentration and agitation.       Objective:   Physical Exam    Constitutional: He is oriented to person, place, and time. He appears well-developed and well-nourished.  HENT:  Head: Normocephalic and atraumatic.  Eyes: Conjunctivae and EOM are normal.  Neck: Normal range of motion. Neck supple.  Cardiovascular: Normal rate, regular rhythm and normal heart sounds.  Exam reveals no gallop and no friction rub.   No murmur heard. Pulmonary/Chest: Effort normal and breath sounds normal. No respiratory distress. He has no wheezes. He has no rales. He exhibits tenderness.  Abdominal: Soft. Bowel sounds are normal. He exhibits no distension. There is no tenderness. There is no rebound.  Musculoskeletal: Normal range of motion. He exhibits no edema or tenderness.  Neurological: He is alert and oriented to person, place, and time.  Skin: Skin is warm and dry. No rash noted. No erythema. No pallor.  Psychiatric: He has a normal mood and affect. His behavior is normal. Judgment and thought content normal.          Assessment & Plan:  HIV: Ascent new prescriptions in for PREZCOBIX and Truvada. Recheck labs today and in one month's time  I spent greater than 25 minutes with the patient including greater than 50% of time in face to face counsel of the patient and in coordination of their care need to adhere to antiretroviral medications and need to enroll in a tap today which we sent him to do. We also were investigating use of the [facility] to get him antiretrovirals with a neck 48 hours  Bullous emphysema: He has some fairly substantial changes in the lungs and perhaps might benefit from lung reduction surgery. He understands clearly needs to stop smoking and hopefully he can do this. I told him the idea of lung reduction surgery  however would not be entertained until he had gotten his virus under control and he had formal pulmonary for pulmonary function tests performed. I also counseled him extensively about this during the face-to-face time mentioned  above.  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  Low testosterone: should check with labs next visit, and can see if he needs to get this through some type of assistance program.  Disability needs: Patient should qualify with disability given his extensive lung disease which should be making it difficult for him to work. He is currently unemployed. Hopefully if he gets disability we can get him through into Medicaid and allowed to get greater access to care. Currently he is relying on the arch card outside of [facility] and [facility]   MSK chest pain: left chest is tender to palpation expect he hurt an intercostal or rib. Manage with NSAIds

## 2015-02-10 LAB — T-HELPER CELL (CD4) - (RCID CLINIC ONLY)
CD4 T CELL HELPER: 14 % — AB (ref 33–55)
CD4 T Cell Abs: 100 /uL — ABNORMAL LOW (ref 400–2700)

## 2015-02-10 LAB — RPR

## 2015-02-11 LAB — HIV-1 RNA ULTRAQUANT REFLEX TO GENTYP+
HIV 1 RNA Quant: 10670 copies/mL — ABNORMAL HIGH (ref <20)
HIV-1 RNA Quant, Log: 4.03 {Log} — ABNORMAL HIGH (ref <1.30)

## 2015-02-13 ENCOUNTER — Telehealth: Payer: Self-pay | Admitting: Licensed Clinical Social Worker

## 2015-02-13 ENCOUNTER — Other Ambulatory Visit: Payer: Self-pay | Admitting: Licensed Clinical Social Worker

## 2015-02-13 DIAGNOSIS — R7989 Other specified abnormal findings of blood chemistry: Secondary | ICD-10-CM

## 2015-02-13 DIAGNOSIS — B20 Human immunodeficiency virus [HIV] disease: Secondary | ICD-10-CM

## 2015-02-13 DIAGNOSIS — Z9119 Patient's noncompliance with other medical treatment and regimen: Secondary | ICD-10-CM

## 2015-02-13 DIAGNOSIS — Z91199 Patient's noncompliance with other medical treatment and regimen due to unspecified reason: Secondary | ICD-10-CM

## 2015-02-13 MED ORDER — DARUNAVIR-COBICISTAT 800-150 MG PO TABS: 1.0000 | ORAL_TABLET | Freq: Every day | ORAL | Status: DC

## 2015-02-13 NOTE — Telephone Encounter (Signed)
Patient called to let us know that he has an orange card now.

## 2015-02-14 ENCOUNTER — Emergency Department (HOSPITAL_COMMUNITY)
Admission: EM | Admit: 2015-02-14 | Discharge: 2015-02-14 | Disposition: A | Discharge status: Home / Self Care | Payer: Self-pay | Attending: Emergency Medicine | Admitting: Emergency Medicine

## 2015-02-14 ENCOUNTER — Emergency Department (HOSPITAL_COMMUNITY): Payer: Self-pay

## 2015-02-14 ENCOUNTER — Encounter (HOSPITAL_COMMUNITY): Payer: Self-pay | Admitting: *Deleted

## 2015-02-14 DIAGNOSIS — R079 Chest pain, unspecified: Secondary | ICD-10-CM

## 2015-02-14 DIAGNOSIS — R0789 Other chest pain: Secondary | ICD-10-CM | POA: Insufficient documentation

## 2015-02-14 DIAGNOSIS — Z7952 Long term (current) use of systemic steroids: Secondary | ICD-10-CM | POA: Insufficient documentation

## 2015-02-14 DIAGNOSIS — B20 Human immunodeficiency virus [HIV] disease: Secondary | ICD-10-CM | POA: Insufficient documentation

## 2015-02-14 DIAGNOSIS — Z720 Tobacco use: Secondary | ICD-10-CM | POA: Insufficient documentation

## 2015-02-14 DIAGNOSIS — J439 Emphysema, unspecified: Secondary | ICD-10-CM | POA: Insufficient documentation

## 2015-02-14 DIAGNOSIS — Z79899 Other long term (current) drug therapy: Secondary | ICD-10-CM | POA: Insufficient documentation

## 2015-02-14 LAB — CBC WITH DIFFERENTIAL/PLATELET
Basophils Absolute: 0 10*3/uL (ref 0.0–0.1)
Basophils Relative: 1 % (ref 0–1)
EOS ABS: 0 10*3/uL (ref 0.0–0.7)
EOS PCT: 0 % (ref 0–5)
HCT: 41.8 % (ref 39.0–52.0)
HEMOGLOBIN: 14 g/dL (ref 13.0–17.0)
LYMPHS ABS: 0.7 10*3/uL (ref 0.7–4.0)
Lymphocytes Relative: 17 % (ref 12–46)
MCH: 31.9 pg (ref 26.0–34.0)
MCHC: 33.5 g/dL (ref 30.0–36.0)
MCV: 95.2 fL (ref 78.0–100.0)
Monocytes Absolute: 0.2 10*3/uL (ref 0.1–1.0)
Monocytes Relative: 4 % (ref 3–12)
Neutro Abs: 3.3 10*3/uL (ref 1.7–7.7)
Neutrophils Relative %: 78 % — ABNORMAL HIGH (ref 43–77)
PLATELETS: 160 10*3/uL (ref 150–400)
RBC: 4.39 MIL/uL (ref 4.22–5.81)
RDW: 13.2 % (ref 11.5–15.5)
WBC: 4.2 10*3/uL (ref 4.0–10.5)

## 2015-02-14 LAB — I-STAT TROPONIN, ED
Troponin i, poc: 0 ng/mL (ref 0.00–0.08)
Troponin i, poc: 0 ng/mL (ref 0.00–0.08)

## 2015-02-14 LAB — RAPID URINE DRUG SCREEN, HOSP PERFORMED
Amphetamines: NOT DETECTED
BARBITURATES: NOT DETECTED
BENZODIAZEPINES: NOT DETECTED
Cocaine: POSITIVE — AB
Opiates: NOT DETECTED
Tetrahydrocannabinol: POSITIVE — AB

## 2015-02-14 LAB — BASIC METABOLIC PANEL
Anion gap: 4 — ABNORMAL LOW (ref 5–15)
BUN: 9 mg/dL (ref 6–23)
CHLORIDE: 103 mmol/L (ref 96–112)
CO2: 30 mmol/L (ref 19–32)
Calcium: 8.9 mg/dL (ref 8.4–10.5)
Creatinine, Ser: 1.14 mg/dL (ref 0.50–1.35)
GFR calc non Af Amer: 74 mL/min — ABNORMAL LOW (ref >90)
GFR, EST AFRICAN AMERICAN: 86 mL/min — AB (ref >90)
Glucose, Bld: 114 mg/dL — ABNORMAL HIGH (ref 70–99)
POTASSIUM: 4.1 mmol/L (ref 3.5–5.1)
SODIUM: 137 mmol/L (ref 135–145)

## 2015-02-14 MED ORDER — HYDROCODONE-ACETAMINOPHEN 5-325 MG PO TABS
1.0000 | ORAL_TABLET | Freq: Once | ORAL | Status: AC
  Administered 2015-02-14: 1 via ORAL
  Filled 2015-02-14: qty 1

## 2015-02-14 MED ORDER — ALBUTEROL SULFATE HFA 108 (90 BASE) MCG/ACT IN AERS
2.0000 | INHALATION_SPRAY | Freq: Once | RESPIRATORY_TRACT | Status: AC
  Administered 2015-02-14: 2 via RESPIRATORY_TRACT
  Filled 2015-02-14: qty 6.7

## 2015-02-14 MED ORDER — OXYCODONE-ACETAMINOPHEN 5-325 MG PO TABS
1.0000 | ORAL_TABLET | Freq: Once | ORAL | Status: AC
  Administered 2015-02-14: 1 via ORAL
  Filled 2015-02-14: qty 1

## 2015-02-14 NOTE — Discharge Instructions (Signed)
Ibuprofen for tylenol for pain. Follow up with your ID doctor and primary care doctor for referral to pulmonology and further treatment.    Chest Pain (Nonspecific) It is often hard to give a specific diagnosis for the cause of chest pain. There is always a chance that your pain could be related to something serious, such as a heart attack or a blood clot in the lungs. You need to follow up with your health care provider for further evaluation. CAUSES   Heartburn.  Pneumonia or bronchitis.  Anxiety or stress.  Inflammation around your heart (pericarditis) or lung (pleuritis or pleurisy).  A blood clot in the lung.  A collapsed lung (pneumothorax). It can develop suddenly on its own (spontaneous pneumothorax) or from trauma to the chest.  Shingles infection (herpes zoster virus). The chest wall is composed of bones, muscles, and cartilage. Any of these can be the source of the pain.  The bones can be bruised by injury.  The muscles or cartilage can be strained by coughing or overwork.  The cartilage can be affected by inflammation and become sore (costochondritis). DIAGNOSIS  Lab tests or other studies may be needed to find the cause of your pain. Your health care provider may have you take a test called an ambulatory electrocardiogram (ECG). An ECG records your heartbeat patterns over a 24-hour period. You may also have other tests, such as:  Transthoracic echocardiogram (TTE). During echocardiography, sound waves are used to evaluate how blood flows through your heart.  Transesophageal echocardiogram (TEE).  Cardiac monitoring. This allows your health care provider to monitor your heart rate and rhythm in real time.  Holter monitor. This is a portable device that records your heartbeat and can help diagnose heart arrhythmias. It allows your health care provider to track your heart activity for several days, if needed.  Stress tests by exercise or by giving medicine that makes  the heart beat faster. TREATMENT   Treatment depends on what may be causing your chest pain. Treatment may include:  Acid blockers for heartburn.  Anti-inflammatory medicine.  Pain medicine for inflammatory conditions.  Antibiotics if an infection is present.  You may be advised to change lifestyle habits. This includes stopping smoking and avoiding alcohol, caffeine, and chocolate.  You may be advised to keep your head raised (elevated) when sleeping. This reduces the chance of acid going backward from your stomach into your esophagus. Most of the time, nonspecific chest pain will improve within 2-3 days with rest and mild pain medicine.  HOME CARE INSTRUCTIONS   If antibiotics were prescribed, take them as directed. Finish them even if you start to feel better.  For the next few days, avoid physical activities that bring on chest pain. Continue physical activities as directed.  Do not use any tobacco products, including cigarettes, chewing tobacco, or electronic cigarettes.  Avoid drinking alcohol.  Only take medicine as directed by your health care provider.  Follow your health care provider's suggestions for further testing if your chest pain does not go away.  Keep any follow-up appointments you made. If you do not go to an appointment, you could develop lasting (chronic) problems with pain. If there is any problem keeping an appointment, call to reschedule. SEEK MEDICAL CARE IF:   Your chest pain does not go away, even after treatment.  You have a rash with blisters on your chest.  You have a fever. SEEK IMMEDIATE MEDICAL CARE IF:   You have increased chest pain or pain  that spreads to your arm, neck, jaw, back, or abdomen.  You have shortness of breath.  You have an increasing cough, or you cough up blood.  You have severe back or abdominal pain.  You feel nauseous or vomit.  You have severe weakness.  You faint.  You have chills. This is an emergency.  Do not wait to see if the pain will go away. Get medical help at once. Call your local emergency services (911 in [place] Do not drive yourself to the hospital. MAKE SURE YOU:   Understand these instructions.  Will watch your condition.  Will get help right away if you are not doing well or get worse. Document Released: 08/14/2005 Document Revised: 11/09/2013 Document Reviewed: 06/09/2008 [facility] Patient Information 2015 [place], [place]. This information is not intended to replace advice given to you by your health care provider. Make sure you discuss any questions you have with your health care provider.

## 2015-02-14 NOTE — ED Notes (Signed)
I gave the patient a happy meal and 2 containers of orange juice per nurse Maralyn SagoSarah.

## 2015-02-14 NOTE — ED Provider Notes (Signed)
CSN: [id number]     Arrival date & time 02/14/15  1021 History   First MD Initiated Contact with Patient 02/14/15 1028     Chief Complaint  Patient presents with  . Chest Pain  . Shortness of Breath     (Consider location/radiation/quality/duration/timing/severity/associated sxs/prior Treatment) HPI Curtis Hodge is a 49 y.o. male with history of HIV, COPD, presents to emergency department complaining of left-sided chest pain or shortness of breath. Patient states his symptoms have been going on for several weeks. He was seen here a few weeks ago diagnosed with bullous emphysema. States was referred to primary care doctor who he has seen. States at that time he did not have an orange card and states he was unable to get any medications. Reports that his primary care doctor wanted to start him on a steroid inhaler for his emphysema. He states his main issue is the pain. He has been constant for several weeks. He doesn't know of anything that makes it better or worse. Pain is in the left side. He states when he was here he was prescribed some pain medicine but he ran out. He is currently not using any medications for his emphysema. He denies any cough. He denies any fever or chills. Recently seen his ID doctor and restarted HIV meds. Last CD4 is 100, checked 5 days ago.  Past Medical History  Diagnosis Date  . HIV (human immunodeficiency virus infection)   . Hepatitis B   . Incarceration    Past Surgical History  Procedure Laterality Date  . I&d extremity  05/21/2012    Procedure: IRRIGATION AND DEBRIDEMENT EXTREMITY;  Surgeon: Javier DockerJeffrey C Beane, MD;  Location: [facility] OR;  Service: Orthopedics;  Laterality: Right;   History reviewed. No pertinent family history. History  Substance Use Topics  . Smoking status: Current Every Day Smoker -- 1.50 packs/day  . Smokeless tobacco: Never Used  . Alcohol Use: 1.0 oz/week    2 drink(s) per week     Comment: occassional    Review of Systems   Constitutional: Negative for fever and chills.  Respiratory: Positive for chest tightness and shortness of breath. Negative for cough.   Cardiovascular: Positive for chest pain. Negative for palpitations and leg swelling.  Gastrointestinal: Negative for nausea, vomiting, abdominal pain, diarrhea and abdominal distention.  Genitourinary: Negative for dysuria, urgency, frequency and hematuria.  Musculoskeletal: Negative for myalgias, arthralgias, neck pain and neck stiffness.  Skin: Negative for rash.  Allergic/Immunologic: Positive for immunocompromised state.  Neurological: Negative for dizziness, weakness, light-headedness, numbness and headaches.  All other systems reviewed and are negative.     Allergies  Review of patient's allergies indicates no known allergies.  Home Medications   Prior to Admission medications   Medication Sig Start Date End Date Taking? Authorizing Provider  darunavir-cobicistat (PREZCOBIX) 800-150 MG per tablet Take 1 tablet by mouth daily. 02/13/15   Randall Hissornelius N Van Dam, MD  emtricitabine-tenofovir (TRUVADA) 200-300 MG per tablet Take 1 tablet by mouth daily. 02/09/15 02/09/16  Randall Hissornelius N Van Dam, MD  Gabapentin, PHN, 300 MG TABS Take 300 mg by mouth at bedtime as needed. Patient not taking: Reported on 10/04/2014 06/30/13   Randall Hissornelius N Van Dam, MD  oxyCODONE-acetaminophen (PERCOCET) 5-325 MG per tablet Take 1-2 tablets by mouth every 6 (six) hours as needed for moderate pain. Patient not taking: Reported on 02/09/2015 02/08/15   Antony MaduraKelly Humes, PA-C  predniSONE (DELTASONE) 50 MG tablet Take 1 tablet (50 mg total) by mouth daily.  10/04/14   Dione Booze, MD  testosterone cypionate (DEPOTESTOTERONE CYPIONATE) 200 MG/ML injection Inject 0.5 mLs (100 mg total) into the muscle every 14 (fourteen) days. Patient not taking: Reported on 10/04/2014 04/14/13   Randall Hiss, MD  traMADol (ULTRAM) 50 MG tablet Take 1 tablet (50 mg total) by mouth at bedtime as needed for  pain. Patient not taking: Reported on 10/04/2014 08/30/13   Randall Hiss, MD   BP 132/86 mmHg  Pulse 87  Temp(Src) 98.2 F (36.8 C) (Oral)  Resp 20  Ht  (1.778 m)  Wt 154 lb 6.4 oz (70.035 kg)  BMI 22.15 kg/m2  SpO2 100% Physical Exam  Constitutional: He is oriented to person, place, and time. He appears well-developed and well-nourished. No distress.  HENT:  Head: Normocephalic and atraumatic.  Eyes: Conjunctivae are normal.  Neck: Neck supple.  Cardiovascular: Normal rate, regular rhythm, normal heart sounds and intact distal pulses.   Pulmonary/Chest: Effort normal. No respiratory distress. He has no wheezes. He has no rales. He exhibits tenderness.  Left upper chest wall  Abdominal: Soft. Bowel sounds are normal. He exhibits no distension. There is no tenderness. There is no rebound.  Musculoskeletal: He exhibits no edema.  Neurological: He is alert and oriented to person, place, and time.  Skin: Skin is warm and dry.  Nursing note and vitals reviewed.   ED Course  Procedures (including critical care time) Labs Review Labs Reviewed  CBC WITH DIFFERENTIAL/PLATELET - Abnormal; Notable for the following:    Neutrophils Relative % 78 (*)    All other components within normal limits  BASIC METABOLIC PANEL - Abnormal; Notable for the following:    Glucose, Bld 114 (*)    GFR calc non Af Amer 74 (*)    GFR calc Af Amer 86 (*)    Anion gap 4 (*)    All other components within normal limits  URINE RAPID DRUG SCREEN (HOSP PERFORMED) - Abnormal; Notable for the following:    Cocaine POSITIVE (*)    Tetrahydrocannabinol POSITIVE (*)    All other components within normal limits  I-STAT TROPOININ, ED  Rosezena Sensor, ED    Imaging Review Dg Chest 2 View  02/14/2015   CLINICAL DATA:  49 year old male with chest pain and shortness of breath for several days. Initial encounter.  EXAM: CHEST  2 VIEW  COMPARISON:  Chest CTA 02/08/2015  FINDINGS: Bolus emphysema re-  identified. Normal cardiac size and mediastinal contours. Visualized tracheal air column is within normal limits. No pneumothorax, pulmonary edema, pleural effusion or consolidation. No confluent pulmonary opacity. No acute osseous abnormality identified.  IMPRESSION: Severe bullous emphysema.   Electronically Signed   By: Odessa Fleming M.D.   On: 02/14/2015 12:36    ED ECG REPORT   Date: 02/14/2015  Rate: 84  Rhythm: normal sinus rhythm  QRS Axis: normal  Intervals: normal  ST/T Wave abnormalities: normal  Conduction Disutrbances:none  Narrative Interpretation:   Old EKG Reviewed: unchanged  I have personally reviewed the EKG tracing and agree with the computerized printout as noted.   MDM   Final diagnoses:  Chest pain, unspecified chest pain type  Bullous emphysema    Pt with bullous emphysema, HIV, here for pain in left chest. Pain is atypical, constat, not worsened with breathing or exertion. TTP on exam. Will recheck labs, cxr, monitor.   2:13 PM Labs unremarkable. Chest pain improved with norco. Will get another trop. Pt asking for pain medication  prescription. Will hold off given positive urine for cocaine. Pt's last CD4 is 100, however, no evidence of infection on todays work up. Will need close follow up with ID and pulmonology. Pt states he will walk to ID clinic right now.   Filed Vitals:   02/14/15 1230 02/14/15 1330 02/14/15 1345 02/14/15 1400  BP: 132/82 128/84 128/75 133/86  Pulse: 71 87 73 78  Temp:      TempSrc:      Resp: Height:      Weight:      SpO2: 96% 98% 97% 99%       Jaynie Crumble, PA-C 02/14/15 1712  Nelva Nay, MD 02/19/15 [phone]

## 2015-02-14 NOTE — ED Notes (Signed)
Pt reports being seen here last week for same. Has left side chest pain and sob. No resp distress noted, ekg done.

## 2015-02-21 LAB — HIV-1 GENOTYPR PLUS

## 2015-03-14 ENCOUNTER — Encounter: Payer: Self-pay | Admitting: *Deleted

## 2015-03-16 ENCOUNTER — Emergency Department (HOSPITAL_COMMUNITY): Payer: Self-pay

## 2015-03-16 ENCOUNTER — Encounter (HOSPITAL_COMMUNITY): Payer: Self-pay | Admitting: *Deleted

## 2015-03-16 ENCOUNTER — Emergency Department (HOSPITAL_COMMUNITY)
Admission: EM | Admit: 2015-03-16 | Discharge: 2015-03-16 | Disposition: A | Discharge status: Home / Self Care | Payer: Self-pay | Attending: Emergency Medicine | Admitting: Emergency Medicine

## 2015-03-16 DIAGNOSIS — Z21 Asymptomatic human immunodeficiency virus [HIV] infection status: Secondary | ICD-10-CM | POA: Insufficient documentation

## 2015-03-16 DIAGNOSIS — Z720 Tobacco use: Secondary | ICD-10-CM | POA: Insufficient documentation

## 2015-03-16 DIAGNOSIS — F419 Anxiety disorder, unspecified: Secondary | ICD-10-CM | POA: Insufficient documentation

## 2015-03-16 DIAGNOSIS — J439 Emphysema, unspecified: Secondary | ICD-10-CM

## 2015-03-16 DIAGNOSIS — R0789 Other chest pain: Secondary | ICD-10-CM

## 2015-03-16 DIAGNOSIS — Z8619 Personal history of other infectious and parasitic diseases: Secondary | ICD-10-CM | POA: Insufficient documentation

## 2015-03-16 DIAGNOSIS — Z79899 Other long term (current) drug therapy: Secondary | ICD-10-CM | POA: Insufficient documentation

## 2015-03-16 LAB — BASIC METABOLIC PANEL
Anion gap: 7 (ref 5–15)
BUN: 7 mg/dL (ref 6–23)
CALCIUM: 8.7 mg/dL (ref 8.4–10.5)
CHLORIDE: 106 mmol/L (ref 96–112)
CO2: 25 mmol/L (ref 19–32)
Creatinine, Ser: 1 mg/dL (ref 0.50–1.35)
GFR calc Af Amer: >90 mL/min (ref >90)
GFR calc non Af Amer: 87 mL/min — ABNORMAL LOW (ref >90)
Glucose, Bld: 96 mg/dL (ref 70–99)
Potassium: 3.8 mmol/L (ref 3.5–5.1)
SODIUM: 138 mmol/L (ref 135–145)

## 2015-03-16 LAB — I-STAT TROPONIN, ED
Troponin i, poc: 0 ng/mL (ref 0.00–0.08)
Troponin i, poc: 0.01 ng/mL (ref 0.00–0.08)

## 2015-03-16 LAB — CBC
HEMATOCRIT: 34.6 % — AB (ref 39.0–52.0)
HEMOGLOBIN: 11.8 g/dL — AB (ref 13.0–17.0)
MCH: 32.1 pg (ref 26.0–34.0)
MCHC: 34.1 g/dL (ref 30.0–36.0)
MCV: 94 fL (ref 78.0–100.0)
Platelets: 166 10*3/uL (ref 150–400)
RBC: 3.68 MIL/uL — ABNORMAL LOW (ref 4.22–5.81)
RDW: 14.9 % (ref 11.5–15.5)
WBC: 3.7 10*3/uL — ABNORMAL LOW (ref 4.0–10.5)

## 2015-03-16 LAB — BRAIN NATRIURETIC PEPTIDE: B NATRIURETIC PEPTIDE 5: 30.2 pg/mL (ref 0.0–100.0)

## 2015-03-16 MED ORDER — ALBUTEROL SULFATE (2.5 MG/3ML) 0.083% IN NEBU
5.0000 mg | INHALATION_SOLUTION | Freq: Once | RESPIRATORY_TRACT | Status: AC
  Administered 2015-03-16: 5 mg via RESPIRATORY_TRACT

## 2015-03-16 MED ORDER — ALBUTEROL SULFATE (2.5 MG/3ML) 0.083% IN NEBU
INHALATION_SOLUTION | RESPIRATORY_TRACT | Status: AC
  Filled 2015-03-16: qty 6

## 2015-03-16 MED ORDER — OXYCODONE-ACETAMINOPHEN 5-325 MG PO TABS: 1.0000 | ORAL_TABLET | Freq: Four times a day (QID) | ORAL | Status: DC | PRN

## 2015-03-16 MED ORDER — ALBUTEROL SULFATE HFA 108 (90 BASE) MCG/ACT IN AERS
2.0000 | INHALATION_SPRAY | Freq: Once | RESPIRATORY_TRACT | Status: AC
  Administered 2015-03-16: 2 via RESPIRATORY_TRACT
  Filled 2015-03-16: qty 6.7

## 2015-03-16 MED ORDER — LORAZEPAM 1 MG PO TABS
1.0000 mg | ORAL_TABLET | Freq: Once | ORAL | Status: AC
  Administered 2015-03-16: 1 mg via ORAL
  Filled 2015-03-16: qty 1

## 2015-03-16 MED ORDER — OXYCODONE-ACETAMINOPHEN 5-325 MG PO TABS
1.0000 | ORAL_TABLET | Freq: Once | ORAL | Status: AC
  Administered 2015-03-16: 1 via ORAL
  Filled 2015-03-16: qty 1

## 2015-03-16 NOTE — ED Notes (Addendum)
Pt hyperventilating in triage, will only speak in short phrases at times, then speaking in full sentences without distress, states he is breathing this way due to his chest pain, encouraged to slow breathing down, breathing treatment started- pt states he has been seen in the past for this pain and he is treated with percocet

## 2015-03-16 NOTE — ED Provider Notes (Signed)
CSN: [id number]     Arrival date & time 03/16/15  1945 History   First MD Initiated Contact with Patient 03/16/15 2141     Chief Complaint  Patient presents with  . Shortness of Breath     (Consider location/radiation/quality/duration/timing/severity/associated sxs/prior Treatment) Patient is a 49 y.o. male presenting with chest pain. The history is provided by the patient and medical records. No language interpreter was used.  Chest Pain Pain location:  L lateral chest Pain quality: shooting and throbbing   Pain quality comment:  Squeezing Pain radiates to:  Does not radiate Pain radiates to the back: no   Pain severity:  Severe Onset quality:  Gradual Timing:  Constant Progression:  Waxing and waning Context: at rest   Relieved by: narcotics. Worsened by:  Nothing tried Ineffective treatments: narcotics, tylenol. Associated symptoms: anxiety, cough and shortness of breath   Associated symptoms: no abdominal pain, no altered mental status, no anorexia, no claudication, no fatigue, no fever, no headache, no heartburn, no lower extremity edema, no nausea, no palpitations, not vomiting and no weakness   Risk factors: male sex and smoking   Risk factors: no aortic disease, no coronary artery disease, no diabetes mellitus and no high cholesterol     Past Medical History  Diagnosis Date  . HIV (human immunodeficiency virus infection)   . Hepatitis B   . Incarceration    Past Surgical History  Procedure Laterality Date  . I&d extremity  05/21/2012    Procedure: IRRIGATION AND DEBRIDEMENT EXTREMITY;  Surgeon: Javier DockerJeffrey C Beane, MD;  Location: [facility] OR;  Service: Orthopedics;  Laterality: Right;   History reviewed. No pertinent family history. History  Substance Use Topics  . Smoking status: Current Every Day Smoker -- 1.50 packs/day  . Smokeless tobacco: Never Used  . Alcohol Use: 1.0 oz/week    2 drink(s) per week     Comment: occassional    Review of Systems  Constitutional:  Negative for fever and fatigue.  Respiratory: Positive for cough, chest tightness, shortness of breath and wheezing.   Cardiovascular: Positive for chest pain. Negative for palpitations and claudication.  Gastrointestinal: Negative for heartburn, nausea, vomiting, abdominal pain and anorexia.  Neurological: Negative for weakness, light-headedness and headaches.  Psychiatric/Behavioral: Negative for confusion.  All other systems reviewed and are negative.     Allergies  Review of patient's allergies indicates no known allergies.  Home Medications   Prior to Admission medications   Medication Sig Start Date End Date Taking? Authorizing Provider  darunavir-cobicistat (PREZCOBIX) 800-150 MG per tablet Take 1 tablet by mouth daily. 02/13/15   Randall Hissornelius N Van Dam, MD  emtricitabine-tenofovir (TRUVADA) 200-300 MG per tablet Take 1 tablet by mouth daily. 02/09/15 02/09/16  Randall Hissornelius N Van Dam, MD  Gabapentin, PHN, 300 MG TABS Take 300 mg by mouth at bedtime as needed. 06/30/13   Randall Hissornelius N Van Dam, MD  oxyCODONE-acetaminophen (PERCOCET) 5-325 MG per tablet Take 1-2 tablets by mouth every 6 (six) hours as needed for moderate pain. Patient not taking: Reported on 02/09/2015 02/08/15   Antony MaduraKelly Humes, PA-C  predniSONE (DELTASONE) 50 MG tablet Take 1 tablet (50 mg total) by mouth daily. Patient not taking: Reported on 02/14/2015 10/04/14   Dione Boozeavid Glick, MD  traMADol (ULTRAM) 50 MG tablet Take 1 tablet (50 mg total) by mouth at bedtime as needed for pain. Patient not taking: Reported on 10/04/2014 08/30/13   Randall Hissornelius N Van Dam, MD   BP 121/94 mmHg  Pulse 96  Temp(Src) 98.7  F (37.1 C) (Oral)  Resp 36  Wt 156 lb (70.761 kg)  SpO2 100%   Physical Exam  Constitutional: He is oriented to person, place, and time. He appears well-developed and well-nourished. He appears ill (chronically). No distress.  Thin male who appears older than his stated age, looks chronically ill but not acutely toxic  HENT:   Head: Normocephalic and atraumatic.  Nose: Nose normal.  Mouth/Throat: Oropharynx is clear and moist. No oropharyngeal exudate.  Eyes: EOM are normal. Pupils are equal, round, and reactive to light.  Neck: Normal range of motion. Neck supple.  Cardiovascular: Normal rate, regular rhythm, normal heart sounds and intact distal pulses.   No murmur heard. Pulmonary/Chest: Effort normal and breath sounds normal. No respiratory distress. He has no wheezes. He exhibits tenderness (left chest wall tenderness ).  Abdominal: Soft. He exhibits no distension. There is no tenderness. There is no guarding.  Musculoskeletal: Normal range of motion. He exhibits no tenderness.  Neurological: He is alert and oriented to person, place, and time. No cranial nerve deficit. Coordination normal.  Skin: Skin is warm and dry. He is not diaphoretic. No pallor.  Psychiatric: His behavior is normal. Judgment and thought content normal. His mood appears anxious.  Anxious and tearful but overall appropriate  Nursing note and vitals reviewed.   ED Course  Procedures (including critical care time) Labs Review Labs Reviewed  CBC - Abnormal; Notable for the following:    WBC 3.7 (*)    RBC 3.68 (*)    Hemoglobin 11.8 (*)    HCT 34.6 (*)    All other components within normal limits  BASIC METABOLIC PANEL - Abnormal; Notable for the following:    GFR calc non Af Amer 87 (*)    All other components within normal limits  BRAIN NATRIURETIC PEPTIDE  I-STAT TROPOININ, ED    Imaging Review Dg Chest 2 View  03/16/2015   CLINICAL DATA:  Shortness of breath and chest tightness which began yesterday. Current history of COPD.  EXAM: CHEST  2 VIEW  COMPARISON:  02/14/2015 and earlier.  FINDINGS: Cardiac silhouette normal in size, unchanged. Prominent central pulmonary arteries, unchanged. Hilar and mediastinal contours otherwise unremarkable. Severe bullous emphysematous changes in both lungs, particularly the upper lobes,  right greater than left. Lungs clear. No pleural effusions. No pneumothorax. Visualized bony thorax intact.  IMPRESSION: Severe bullous emphysema.  No acute cardiopulmonary disease.   Electronically Signed   By: Hulan Saas M.D.   On: 03/16/2015 20:47     EKG Interpretation None      MDM   Final diagnoses:  Left-sided chest wall pain  Pulmonary emphysema, unspecified emphysema type   Pt is a 50 yo M with hx of HIV (viral load of [address]81 in March), emphysema, Hep B, who presents with left sided chest pain.  He complains of recurrent episodes of squeezing chest pain that is on/off for the past month.  He has been seen by the ED for similar complaints, which was diagnosed as severe bullous emphysema but no signs of cardiac disease or PE.  Patient has a long history of tobacco abuse (was smoking 3 packs per day prior to his recent diagnosis of emphysema).   He denies increased SOB, lightheadedness, diaphoresis, nausea, or back pain.  He is still able to do his normal daily activities and lift weights, etc.    EKG in triage was NSR, no signs of ischemia.  CXR showed severe emphysema in both lungs but otherwise  without signs of acute infection and no change from previous.    He was worked up for similar left sided reproducible chest pain 1 month ago in this ED, with a CTA chest that showed emphysema but was negative for PE.  He reports today's pain is the same pain as his initial ED visit, so do not believe that he would benefit from additional CT scans at this time.     He was given a nebulized albuterol treatment on arrival and will get albuterol MDI for continued coarse breath sounds, which is likely his baseline based on what his lungs look like on CXR.  He maintained O2 sats in upper 90s on room air throughout.    Had 2 x negative troponins.  In light of a benign EKG, negative biomarkers, and reproducible chest pain that has been there for a month, I believe his pain is most likely to be  musculoskeletal in nature.   Advised motrin and tylenol as his primary pain regimen.  Will provide patient with a short refill of his chronic percocet x 10 tablets to last until he can follow up with his doctor on Monday.   Significant time was spent discussing tobacco cessation and the risks involved with emphysematous lungs.  He was given an albuterol MDI and educated on how/when to use it.  He voices  Considered stable for discharge home.  All questions were answered and ED return precautions were discussed prior to dc home.   Patient was seen with ED Attending, Dr. Dallie Piles, MD    Lenell Antu, MD 03/17/15 1417  Eber Hong, MD 03/18/15 [phone]

## 2015-03-16 NOTE — ED Notes (Signed)
Pt in c/o shortness of breath for the last hour, also tightness in chest, states he has been out of his inhaler for the past two weeks

## 2015-03-16 NOTE — ED Provider Notes (Signed)
I saw and evaluated the patient, reviewed the resident's note and I agree with the findings and plan.  Pertinent History: The patient presents with constant chest pain, this has been going on for approximately one month when he was seen in the emergency department at a prior visit and had CT scan of the chest as well as cardiac workup which was unremarkable. Pertinent Exam findings: on exam the patient has some reproducible chest wall tenderness, he is sleeping comfortably without any distress and has normal vital signs, normal EKG and normal lab work.  The patient can be safely discharged to the outpatient setting, there is no signs of pneumothorax despite his underlying severe bullous emphysema. Suggested anti-inflammatories for his ongoing pain   Final diagnoses:  Left-sided chest wall pain  Pulmonary emphysema, unspecified emphysema type    ED ECG REPORT  I personally interpreted this EKG   Date: 03/18/2015   Rate: 97  Rhythm: normal sinus rhythm  QRS Axis: normal  Intervals: normal  ST/T Wave abnormalities: normal  Conduction Disutrbances:none  Narrative Interpretation:   Old EKG Reviewed: unchanged   Eber HongBrian Raphaela Cannaday, MD 03/18/15 1042

## 2015-03-16 NOTE — Discharge Instructions (Signed)

## 2015-03-22 ENCOUNTER — Ambulatory Visit: Payer: Self-pay | Admitting: Infectious Disease

## 2015-04-30 ENCOUNTER — Encounter (HOSPITAL_COMMUNITY): Payer: Self-pay

## 2015-04-30 ENCOUNTER — Emergency Department (HOSPITAL_COMMUNITY)
Admission: EM | Admit: 2015-04-30 | Discharge: 2015-05-01 | Disposition: A | Discharge status: Home / Self Care | Payer: Self-pay | Attending: Emergency Medicine | Admitting: Emergency Medicine

## 2015-04-30 ENCOUNTER — Emergency Department (HOSPITAL_COMMUNITY): Payer: Self-pay

## 2015-04-30 DIAGNOSIS — Z79899 Other long term (current) drug therapy: Secondary | ICD-10-CM | POA: Insufficient documentation

## 2015-04-30 DIAGNOSIS — R0602 Shortness of breath: Secondary | ICD-10-CM

## 2015-04-30 DIAGNOSIS — R0789 Other chest pain: Secondary | ICD-10-CM | POA: Insufficient documentation

## 2015-04-30 DIAGNOSIS — Z21 Asymptomatic human immunodeficiency virus [HIV] infection status: Secondary | ICD-10-CM | POA: Insufficient documentation

## 2015-04-30 DIAGNOSIS — J159 Unspecified bacterial pneumonia: Secondary | ICD-10-CM | POA: Insufficient documentation

## 2015-04-30 DIAGNOSIS — Z720 Tobacco use: Secondary | ICD-10-CM | POA: Insufficient documentation

## 2015-04-30 DIAGNOSIS — R7989 Other specified abnormal findings of blood chemistry: Secondary | ICD-10-CM | POA: Insufficient documentation

## 2015-04-30 DIAGNOSIS — J189 Pneumonia, unspecified organism: Secondary | ICD-10-CM

## 2015-04-30 DIAGNOSIS — Z8619 Personal history of other infectious and parasitic diseases: Secondary | ICD-10-CM | POA: Insufficient documentation

## 2015-04-30 DIAGNOSIS — R945 Abnormal results of liver function studies: Secondary | ICD-10-CM

## 2015-04-30 LAB — COMPREHENSIVE METABOLIC PANEL
ALBUMIN: 2.9 g/dL — AB (ref 3.5–5.0)
ALT: 665 U/L — ABNORMAL HIGH (ref 17–63)
ANION GAP: 8 (ref 5–15)
AST: 556 U/L — ABNORMAL HIGH (ref 15–41)
Alkaline Phosphatase: 128 U/L — ABNORMAL HIGH (ref 38–126)
BUN: 8 mg/dL (ref 6–20)
CHLORIDE: 103 mmol/L (ref 101–111)
CO2: 27 mmol/L (ref 22–32)
Calcium: 8.6 mg/dL — ABNORMAL LOW (ref 8.9–10.3)
Creatinine, Ser: 1.19 mg/dL (ref 0.61–1.24)
GFR calc Af Amer: >60 mL/min (ref >60)
GLUCOSE: 119 mg/dL — AB (ref 65–99)
Potassium: 3.4 mmol/L — ABNORMAL LOW (ref 3.5–5.1)
Sodium: 138 mmol/L (ref 135–145)
TOTAL PROTEIN: 7.3 g/dL (ref 6.5–8.1)
Total Bilirubin: 0.9 mg/dL (ref 0.3–1.2)

## 2015-04-30 LAB — CBC WITH DIFFERENTIAL/PLATELET
BASOS PCT: 0 % (ref 0–1)
Basophils Absolute: 0 10*3/uL (ref 0.0–0.1)
EOS ABS: 0 10*3/uL (ref 0.0–0.7)
EOS PCT: 1 % (ref 0–5)
HEMATOCRIT: 36.3 % — AB (ref 39.0–52.0)
Hemoglobin: 12.7 g/dL — ABNORMAL LOW (ref 13.0–17.0)
LYMPHS ABS: 1.7 10*3/uL (ref 0.7–4.0)
LYMPHS PCT: 47 % — AB (ref 12–46)
MCH: 33.9 pg (ref 26.0–34.0)
MCHC: 35 g/dL (ref 30.0–36.0)
MCV: 96.8 fL (ref 78.0–100.0)
MONOS PCT: 10 % (ref 3–12)
Monocytes Absolute: 0.4 10*3/uL (ref 0.1–1.0)
NEUTROS PCT: 42 % — AB (ref 43–77)
Neutro Abs: 1.6 10*3/uL — ABNORMAL LOW (ref 1.7–7.7)
PLATELETS: 108 10*3/uL — AB (ref 150–400)
RBC: 3.75 MIL/uL — AB (ref 4.22–5.81)
RDW: 14.8 % (ref 11.5–15.5)
WBC: 3.7 10*3/uL — ABNORMAL LOW (ref 4.0–10.5)

## 2015-04-30 LAB — I-STAT TROPONIN, ED: Troponin i, poc: 0 ng/mL (ref 0.00–0.08)

## 2015-04-30 MED ORDER — ALBUTEROL SULFATE (2.5 MG/3ML) 0.083% IN NEBU
5.0000 mg | INHALATION_SOLUTION | Freq: Once | RESPIRATORY_TRACT | Status: AC
  Administered 2015-04-30: 5 mg via RESPIRATORY_TRACT
  Filled 2015-04-30: qty 6

## 2015-04-30 MED ORDER — LORAZEPAM 2 MG/ML IJ SOLN
0.5000 mg | Freq: Once | INTRAMUSCULAR | Status: AC
  Administered 2015-04-30: 0.5 mg via INTRAVENOUS
  Filled 2015-04-30: qty 1

## 2015-04-30 MED ORDER — SODIUM CHLORIDE 0.9 % IV BOLUS (SEPSIS)
500.0000 mL | Freq: Once | INTRAVENOUS | Status: AC
Start: 2015-04-30 — End: 2015-05-01
  Administered 2015-04-30: 500 mL via INTRAVENOUS

## 2015-04-30 NOTE — ED Notes (Signed)
Per EMS: Pt has been weak for 2 days, feels like there is a "lump in my throat." Complaining of SOB. Has been out of meds for ~3weeks. Pt tripoding position to breathe. Also complains of pain in RL lung. Hx. "aggressive form of emphysema" BP 124/80, HR 86bpm. Sats 98% RA

## 2015-04-30 NOTE — ED Provider Notes (Signed)
CSN: [id number]     Arrival date & time 04/30/15  2040 History   First MD Initiated Contact with Patient 04/30/15 2143     Chief Complaint  Patient presents with  . Shortness of Breath  . Chest Pain     (Consider location/radiation/quality/duration/timing/severity/associated sxs/prior Treatment) Patient is a 49 y.o. male presenting with shortness of breath and chest pain. The history is provided by the patient.  Shortness of Breath Severity:  Mild Onset quality:  Gradual Duration:  10 days Timing:  Constant Progression:  Unchanged Chronicity:  Recurrent Context: URI   Context comment:  When lying supine Relieved by:  Sitting up Worsened by:  Nothing tried Ineffective treatments:  Inhaler Associated symptoms: no abdominal pain, no chest pain, no cough, no fever, no headaches, no neck pain and no vomiting   Chest Pain Associated symptoms: shortness of breath   Associated symptoms: no abdominal pain, no cough, no fever, no headache, no nausea, no numbness and not vomiting     Past Medical History  Diagnosis Date  . HIV (human immunodeficiency virus infection)   . Hepatitis B   . Incarceration    Past Surgical History  Procedure Laterality Date  . I&d extremity  05/21/2012    Procedure: IRRIGATION AND DEBRIDEMENT EXTREMITY;  Surgeon: [name], MD;  Location: [facility] OR;  Service: Orthopedics;  Laterality: Right;   History reviewed. No pertinent family history. History  Substance Use Topics  . Smoking status: Current Every Day Smoker -- 1.50 packs/day  . Smokeless tobacco: Never Used  . Alcohol Use: 1.0 oz/week    2 drink(s) per week     Comment: occassional    Review of Systems  Constitutional: Negative for fever.  HENT: Negative for drooling and rhinorrhea.   Eyes: Negative for pain.  Respiratory: Positive for chest tightness and shortness of breath. Negative for cough.   Cardiovascular: Negative for chest pain and leg swelling.  Gastrointestinal: Negative for  nausea, vomiting, abdominal pain and diarrhea.  Genitourinary: Negative for dysuria and hematuria.  Musculoskeletal: Negative for gait problem and neck pain.  Skin: Negative for color change.  Neurological: Negative for numbness and headaches.  Hematological: Negative for adenopathy.  Psychiatric/Behavioral: Negative for behavioral problems.  All other systems reviewed and are negative.     Allergies  Review of patient's allergies indicates no known allergies.  Home Medications   Prior to Admission medications   Medication Sig Start Date End Date Taking? Authorizing Provider  darunavir-cobicistat (PREZCOBIX) 800-150 MG per tablet Take 1 tablet by mouth daily. 02/13/15  Yes Randall Hiss, MD  emtricitabine-tenofovir (TRUVADA) 200-300 MG per tablet Take 1 tablet by mouth daily. 02/09/15 02/09/16 Yes Randall Hiss, MD  Gabapentin, PHN, 300 MG TABS Take 300 mg by mouth at bedtime as needed. Patient taking differently: Take 300 mg by mouth daily.  06/30/13  Yes Randall Hiss, MD  oxyCODONE-acetaminophen (PERCOCET) 5-325 MG per tablet Take 1 tablet by mouth every 6 (six) hours as needed for severe pain. 03/16/15   Lenell Antu, MD  predniSONE (DELTASONE) 50 MG tablet Take 1 tablet (50 mg total) by mouth daily. Patient not taking: Reported on 02/14/2015 10/04/14   Dione Booze, MD  traMADol (ULTRAM) 50 MG tablet Take 1 tablet (50 mg total) by mouth at bedtime as needed for pain. Patient not taking: Reported on 10/04/2014 08/30/13   Randall Hiss, MD   BP 134/111 mmHg  Pulse 99  Resp 17  SpO2  94% Physical Exam  Constitutional: He is oriented to person, place, and time. He appears well-developed and well-nourished.  HENT:  Head: Normocephalic and atraumatic.  Right Ear: External ear normal.  Left Ear: External ear normal.  Nose: Nose normal.  Mouth/Throat: Oropharynx is clear and moist. No oropharyngeal exudate.  Eyes: Conjunctivae and EOM are normal. Pupils are  equal, round, and reactive to light.  Neck: Normal range of motion. Neck supple.  Cardiovascular: Normal rate, regular rhythm, normal heart sounds and intact distal pulses.  Exam reveals no gallop and no friction rub.   No murmur heard. Pulmonary/Chest: Effort normal and breath sounds normal. No respiratory distress. He has no wheezes.  Abdominal: Soft. Bowel sounds are normal. He exhibits no distension. There is no tenderness. There is no rebound and no guarding.  Musculoskeletal: Normal range of motion. He exhibits no edema or tenderness.  Neurological: He is alert and oriented to person, place, and time.  Skin: Skin is warm and dry.  Psychiatric: He has a normal mood and affect. His behavior is normal.  Nursing note and vitals reviewed.   ED Course  Procedures (including critical care time) Labs Review Labs Reviewed  CBC WITH DIFFERENTIAL/PLATELET - Abnormal; Notable for the following:    WBC 3.7 (*)    RBC 3.75 (*)    Hemoglobin 12.7 (*)    HCT 36.3 (*)    Platelets 108 (*)    Neutrophils Relative % 42 (*)    Neutro Abs 1.6 (*)    Lymphocytes Relative 47 (*)    All other components within normal limits  COMPREHENSIVE METABOLIC PANEL - Abnormal; Notable for the following:    Potassium 3.4 (*)    Glucose, Bld 119 (*)    Calcium 8.6 (*)    Albumin 2.9 (*)    AST 556 (*)    ALT 665 (*)    Alkaline Phosphatase 128 (*)    All other components within normal limits  I-STAT TROPOININ, ED    Imaging Review Dg Chest 2 View  04/30/2015   CLINICAL DATA:  Acute onset of shortness of breath and cough. Difficulty swallowing for 10 days. Initial encounter.  EXAM: CHEST  2 VIEW  COMPARISON:  Chest radiograph from 03/16/2015  FINDINGS: The lungs are well-aerated. Prominent bilateral bullae are seen, particularly at the right upper lung zone. Mild bibasilar opacities may reflect mild pneumonia, given the patient's symptoms. There is no evidence of pleural effusion or pneumothorax.  The heart  is normal in size; the mediastinal contour is within normal limits. No acute osseous abnormalities are seen.  IMPRESSION: 1. Mild bibasilar opacities may reflect mild pneumonia, given the patient's symptoms. 2. Prominent bilateral bullae again noted, particularly at the right upper lung zone.   Electronically Signed   By: Roanna Raider M.D.   On: 04/30/2015 22:37     EKG Interpretation   Date/Time:  Sunday April 30 2015 20:46:41 EDT Ventricular Rate:  86 PR Interval:  124 QRS Duration: 87 QT Interval:  371 QTC Calculation: 444 R Axis:   -14 Text Interpretation:  Sinus rhythm Minimal ST elevation, anterior leads No  significant change since last tracing Confirmed by Shirel Mallis  MD, Evangelyn Crouse  ([id number]) on 04/30/2015 9:08:12 PM      MDM   Final diagnoses:  Chest tightness  CAP (community acquired pneumonia)  Elevated LFTs    9:58 PM 49 y.o. male w hx of HIV (CD4 of 100 two mos ago), hep B, emphysema who presents with constant  chest tightness and shortness of breath over the last 10 days. He has had a cough productive of mild white sputum. He denies any fevers. He notes he has had a constant globus sensation in his throat for the last 10 days as well. He is afebrile and vital signs are unremarkable here. He has had similar presentations in the past and a negative CTA for PE earlier in the year. He has no appreciable wheezing on exam but will give breathing treatment to see if it benefits him.  12:32 AM: Pt not febrile, not hypoxic, only mild cough. The patient has mildly elevated liver enzymes also known hepatitis. CXR suspicious for pna. Will treat with doxycycline as it is only minimally metabolized by the liver.  I have discussed the diagnosis/risks/treatment options with the patient and believe the pt to be eligible for discharge home to follow-up with his pcp. We also discussed returning to the ED immediately if new or worsening sx occur. We discussed the sx which are most concerning (e.g.,  worsening sob, fever, recurrent cp) that necessitate immediate return. Medications administered to the patient during their visit and any new prescriptions provided to the patient are listed below.  Medications given during this visit Medications  albuterol (PROVENTIL) (2.5 MG/3ML) 0.083% nebulizer solution 5 mg (5 mg Nebulization Given 04/30/15 2200)  LORazepam (ATIVAN) injection 0.5 mg (0.5 mg Intravenous Given 04/30/15 2301)  sodium chloride 0.9 % bolus 500 mL (0 mLs Intravenous Stopped 05/01/15 0023)  doxycycline (VIBRA-TABS) tablet 100 mg (100 mg Oral Given 05/01/15 0024)    New Prescriptions   DOXYCYCLINE (VIBRAMYCIN) 100 MG CAPSULE    Take 1 capsule (100 mg total) by mouth 2 (two) times daily. One po bid x 7 days     Purvis Sheffield, MD 05/01/15 [phone]

## 2015-05-01 ENCOUNTER — Telehealth: Payer: Self-pay | Admitting: *Deleted

## 2015-05-01 MED ORDER — DOXYCYCLINE HYCLATE 100 MG PO CAPS: 100.0000 mg | ORAL_CAPSULE | Freq: Two times a day (BID) | ORAL | Status: DC

## 2015-05-01 MED ORDER — DOXYCYCLINE HYCLATE 100 MG PO TABS
100.0000 mg | ORAL_TABLET | Freq: Once | ORAL | Status: AC
  Administered 2015-05-01: 100 mg via ORAL
  Filled 2015-05-01: qty 1

## 2015-05-01 NOTE — Discharge Instructions (Signed)

## 2015-05-01 NOTE — Telephone Encounter (Signed)
Patient called and advised he was recently seen in the ED and advised to follow up with the ID clinic asap. Advised him can give him an appt for first available but not today. The patient advised he is very ill and needs to see the doctor today. Advised him he was just released today and he should be ok until he can get in but that Mondays are difficult to get walk ins seen and gave him an appt for 05/16/15 and if he starts to have any problems prior to his visit he could call us back or see his PCP Dr Daleen Squibb.

## 2015-05-16 ENCOUNTER — Ambulatory Visit: Payer: Self-pay | Admitting: Internal Medicine

## 2015-05-23 ENCOUNTER — Encounter (HOSPITAL_COMMUNITY): Payer: Self-pay | Admitting: Emergency Medicine

## 2015-05-23 ENCOUNTER — Emergency Department (HOSPITAL_COMMUNITY)
Admission: EM | Admit: 2015-05-23 | Discharge: 2015-05-24 | Disposition: A | Discharge status: Home / Self Care | Payer: Self-pay | Attending: Emergency Medicine | Admitting: Emergency Medicine

## 2015-05-23 ENCOUNTER — Emergency Department (HOSPITAL_COMMUNITY): Payer: Self-pay

## 2015-05-23 DIAGNOSIS — R0602 Shortness of breath: Secondary | ICD-10-CM | POA: Insufficient documentation

## 2015-05-23 DIAGNOSIS — R197 Diarrhea, unspecified: Secondary | ICD-10-CM | POA: Insufficient documentation

## 2015-05-23 DIAGNOSIS — Z79899 Other long term (current) drug therapy: Secondary | ICD-10-CM | POA: Insufficient documentation

## 2015-05-23 DIAGNOSIS — Z8701 Personal history of pneumonia (recurrent): Secondary | ICD-10-CM | POA: Insufficient documentation

## 2015-05-23 DIAGNOSIS — R1013 Epigastric pain: Secondary | ICD-10-CM | POA: Insufficient documentation

## 2015-05-23 DIAGNOSIS — Z8619 Personal history of other infectious and parasitic diseases: Secondary | ICD-10-CM | POA: Insufficient documentation

## 2015-05-23 DIAGNOSIS — R112 Nausea with vomiting, unspecified: Secondary | ICD-10-CM | POA: Insufficient documentation

## 2015-05-23 DIAGNOSIS — R079 Chest pain, unspecified: Secondary | ICD-10-CM | POA: Insufficient documentation

## 2015-05-23 DIAGNOSIS — Z21 Asymptomatic human immunodeficiency virus [HIV] infection status: Secondary | ICD-10-CM | POA: Insufficient documentation

## 2015-05-23 DIAGNOSIS — Z720 Tobacco use: Secondary | ICD-10-CM | POA: Insufficient documentation

## 2015-05-23 DIAGNOSIS — M549 Dorsalgia, unspecified: Secondary | ICD-10-CM | POA: Insufficient documentation

## 2015-05-23 DIAGNOSIS — R05 Cough: Secondary | ICD-10-CM | POA: Insufficient documentation

## 2015-05-23 LAB — CBC
HEMATOCRIT: 36.8 % — AB (ref 39.0–52.0)
Hemoglobin: 12.8 g/dL — ABNORMAL LOW (ref 13.0–17.0)
MCH: 33.8 pg (ref 26.0–34.0)
MCHC: 34.8 g/dL (ref 30.0–36.0)
MCV: 97.1 fL (ref 78.0–100.0)
PLATELETS: 111 10*3/uL — AB (ref 150–400)
RBC: 3.79 MIL/uL — AB (ref 4.22–5.81)
RDW: 13.1 % (ref 11.5–15.5)
WBC: 3.4 10*3/uL — ABNORMAL LOW (ref 4.0–10.5)

## 2015-05-23 LAB — I-STAT TROPONIN, ED: Troponin i, poc: 0 ng/mL (ref 0.00–0.08)

## 2015-05-23 MED ORDER — GI COCKTAIL ~~LOC~~
30.0000 mL | Freq: Once | ORAL | Status: AC
  Administered 2015-05-23: 30 mL via ORAL
  Filled 2015-05-23: qty 30

## 2015-05-23 MED ORDER — PANTOPRAZOLE SODIUM 40 MG IV SOLR
40.0000 mg | Freq: Once | INTRAVENOUS | Status: AC
  Administered 2015-05-23: 40 mg via INTRAVENOUS
  Filled 2015-05-23: qty 40

## 2015-05-23 MED ORDER — ONDANSETRON HCL 4 MG/2ML IJ SOLN
4.0000 mg | Freq: Once | INTRAMUSCULAR | Status: AC
  Administered 2015-05-23: 4 mg via INTRAVENOUS
  Filled 2015-05-23: qty 2

## 2015-05-23 MED ORDER — MORPHINE SULFATE 4 MG/ML IJ SOLN
INTRAMUSCULAR | Status: AC
  Filled 2015-05-23: qty 1

## 2015-05-23 MED ORDER — MORPHINE SULFATE 4 MG/ML IJ SOLN
4.0000 mg | Freq: Once | INTRAMUSCULAR | Status: AC
  Administered 2015-05-23: 4 mg via INTRAVENOUS
  Filled 2015-05-23: qty 1

## 2015-05-23 NOTE — ED Notes (Signed)
MD at bedside. 

## 2015-05-23 NOTE — ED Notes (Signed)
Pt. reports left chest/epigastric pain with SOB and emesis onset this week , denies cough or diaphoresis .

## 2015-05-23 NOTE — ED Provider Notes (Signed)
CSN: [id number]     Arrival date & time 05/23/15  2245 History  This chart was scribed for Shon Batonourtney F Breylon Sherrow, MD by Evon Slackerrance Branch, ED Scribe. This patient was seen in room A05C/A05C and the patient's care was started at 11:12 PM.     Chief Complaint  Patient presents with  . Chest Pain   The history is provided by the patient. No language interpreter was used.   HPI Comments: Curtis Hodge is a 49 y.o. male who presents to the Emergency Department complaining of chest pain onset 2-3 days prior. Pt rates the severity of his pain 9/10 and describes it as a pressure on his chest. Pt states that he has been vomiting, diarrhea and abdominal cramping that he relates to being hungry. Pt does report cough. Pt states that the pain radiates to his back in between his shoulder blades. Pt states that taking deep breath makes the pain worse. Pt states that he does get some relief when sitting up. Pt has a Hx of pneumonia and states that he has maybe about 2-3 pills left of the antibiotics. Pt denies fever, blood in stool, hematemesis.    Similar presentation on 6/12. Treated for pneumonia with doxycycline. Also noted to have elevated LFTs at that time.   Past Medical History  Diagnosis Date  . HIV (human immunodeficiency virus infection)   . Hepatitis B   . Incarceration    Past Surgical History  Procedure Laterality Date  . I&d extremity  05/21/2012    Procedure: IRRIGATION AND DEBRIDEMENT EXTREMITY;  Surgeon: Javier DockerJeffrey C Beane, MD;  Location: [facility] OR;  Service: Orthopedics;  Laterality: Right;   No family history on file. History  Substance Use Topics  . Smoking status: Current Every Day Smoker -- 1.50 packs/day  . Smokeless tobacco: Never Used  . Alcohol Use: 1.0 oz/week    2 drink(s) per week     Comment: occassional    Review of Systems  Constitutional: Negative.  Negative for fever and chills.  Respiratory: Positive for cough, chest tightness and shortness of breath. Negative for  wheezing.   Cardiovascular: Negative.  Negative for chest pain and leg swelling.  Gastrointestinal: Positive for nausea, vomiting and diarrhea. Negative for abdominal pain and blood in stool.  Genitourinary: Negative.  Negative for dysuria.  Musculoskeletal: Positive for back pain.  Neurological: Negative for headaches.  All other systems reviewed and are negative.     Allergies  Review of patient's allergies indicates no known allergies.  Home Medications   Prior to Admission medications   Medication Sig Start Date End Date Taking? Authorizing Provider  darunavir-cobicistat (PREZCOBIX) 800-150 MG per tablet Take 1 tablet by mouth daily. 02/13/15   Randall Hissornelius N Van Dam, MD  doxycycline (VIBRAMYCIN) 100 MG capsule Take 1 capsule (100 mg total) by mouth 2 (two) times daily. One po bid x 7 days 05/01/15   Purvis SheffieldForrest Harrison, MD  emtricitabine-tenofovir (TRUVADA) 200-300 MG per tablet Take 1 tablet by mouth daily. 02/09/15 02/09/16  Randall Hissornelius N Van Dam, MD  Gabapentin, PHN, 300 MG TABS Take 300 mg by mouth at bedtime as needed. Patient taking differently: Take 300 mg by mouth daily.  06/30/13   Randall Hissornelius N Van Dam, MD  omeprazole (PRILOSEC) 20 MG capsule Take 1 capsule (20 mg total) by mouth daily. 05/24/15   Shon Batonourtney F Dyshaun Bonzo, MD  oxyCODONE-acetaminophen (PERCOCET) 5-325 MG per tablet Take 1 tablet by mouth every 6 (six) hours as needed for severe pain. 03/16/15  Lenell Antu, MD  predniSONE (DELTASONE) 50 MG tablet Take 1 tablet (50 mg total) by mouth daily. Patient not taking: Reported on 02/14/2015 10/04/14   Dione Booze, MD  traMADol (ULTRAM) 50 MG tablet Take 1 tablet (50 mg total) by mouth every 6 (six) hours as needed. 05/24/15   Shon Baton, MD   BP 124/76 mmHg  Pulse 56  Resp 16  SpO2 98%   Physical Exam  Constitutional: He is oriented to person, place, and time. He appears well-developed and well-nourished. No distress.  HENT:  Head: Normocephalic and atraumatic.  Eyes: Pupils  are equal, round, and reactive to light.  Cardiovascular: Normal rate, regular rhythm and normal heart sounds.   No murmur heard. Pulmonary/Chest: Effort normal and breath sounds normal. No respiratory distress. He has no wheezes. He exhibits no tenderness.  Abdominal: Soft. Bowel sounds are normal. There is tenderness. There is no rebound and no guarding.  Mild tenderness to palpation of the epigastrium without rebound or guarding  Musculoskeletal: He exhibits no edema.  Neurological: He is alert and oriented to person, place, and time.  Skin: Skin is warm and dry.  Psychiatric: He has a normal mood and affect.  Nursing note and vitals reviewed.   ED Course  Procedures (including critical care time) DIAGNOSTIC STUDIES: Oxygen Saturation is 97% on RA, normal by my interpretation.    COORDINATION OF CARE: 11:30 PM-Discussed treatment plan with pt at bedside and pt agreed to plan.     Labs Review Labs Reviewed  CBC - Abnormal; Notable for the following:    WBC 3.4 (*)    RBC 3.79 (*)    Hemoglobin 12.8 (*)    HCT 36.8 (*)    Platelets 111 (*)    All other components within normal limits  COMPREHENSIVE METABOLIC PANEL - Abnormal; Notable for the following:    Calcium 8.5 (*)    Albumin 3.2 (*)    AST 154 (*)    ALT 184 (*)    All other components within normal limits  LIPASE, BLOOD - Abnormal; Notable for the following:    Lipase 92 (*)    All other components within normal limits  BRAIN NATRIURETIC PEPTIDE  I-STAT TROPOININ, ED    Imaging Review Dg Chest 2 View  05/23/2015   CLINICAL DATA:  Chest pain.  Emphysema.  EXAM: CHEST  2 VIEW  COMPARISON:  04/30/2015  FINDINGS: Severe bullous emphysema, greater at the right apex. No convincing superimposed pneumothorax. There is no edema, consolidation, or effusion. Normal heart size and mediastinal contours.  IMPRESSION: Bullous emphysema without acute superimposed finding.   Electronically Signed   By: Marnee Spring M.D.   On:  05/23/2015 23:31     EKG Interpretation   Date/Time:  Tuesday May 23 2015 23:01:48 EDT Ventricular Rate:  66 PR Interval:  140 QRS Duration: 82 QT Interval:  406 QTC Calculation: 425 R Axis:   52 Text Interpretation:  Normal sinus rhythm with sinus arrhythmia Normal ECG  No significant change since last tracing Confirmed by Shamirah Ivan  MD, Toni Amend  ([id number]) on 05/23/2015 11:20:37 PM      MDM   Final diagnoses:  Chest pain, unspecified chest pain type  Nausea and vomiting, vomiting of unspecified type    Patient presents with a several day history of chest pain, nausea, vomiting, diarrhea. No fevers. Only history is HIV. Vital signs are reassuring.  Patient is nontoxic on exam. Actually has more epigastric tenderness. Given radiation to back,  will evaluate for pancreatitis or other intra-abdominal pathology. Patient was given GI cocktail, protonic, and morphine and Zofran.  Lab work is largely reassuring. Lipase is mildly elevated; however, not in a range that was suspect for pancreatitis.  LFTs improved from prior. Chest x-ray reassuring. Troponin is negative. EKG is nonischemic. Doubt ACS. Patient is PERC negative. Given GI symptoms in relation to describe chest pain, suspect gastritis versus gastroenteritis versus peptic ulcer disease. Will treat with pain medication and acid reducers.  After history, exam, and medical workup I feel the patient has been appropriately medically screened and is safe for discharge home. Pertinent diagnoses were discussed with the patient. Patient was given return precautions.  I personally performed the services described in this documentation, which was scribed in my presence. The recorded information has been reviewed and is accurate.       Shon Baton, MD 05/24/15 [phone]

## 2015-05-24 ENCOUNTER — Emergency Department (HOSPITAL_COMMUNITY)
Admission: EM | Admit: 2015-05-24 | Discharge: 2015-05-25 | Disposition: A | Discharge status: Home / Self Care | Payer: Self-pay | Attending: Emergency Medicine | Admitting: Emergency Medicine

## 2015-05-24 ENCOUNTER — Encounter (HOSPITAL_COMMUNITY): Payer: Self-pay

## 2015-05-24 DIAGNOSIS — Z79899 Other long term (current) drug therapy: Secondary | ICD-10-CM | POA: Insufficient documentation

## 2015-05-24 DIAGNOSIS — M25512 Pain in left shoulder: Secondary | ICD-10-CM | POA: Insufficient documentation

## 2015-05-24 DIAGNOSIS — M549 Dorsalgia, unspecified: Secondary | ICD-10-CM | POA: Insufficient documentation

## 2015-05-24 DIAGNOSIS — R0602 Shortness of breath: Secondary | ICD-10-CM | POA: Insufficient documentation

## 2015-05-24 DIAGNOSIS — Z8619 Personal history of other infectious and parasitic diseases: Secondary | ICD-10-CM | POA: Insufficient documentation

## 2015-05-24 DIAGNOSIS — R0789 Other chest pain: Secondary | ICD-10-CM | POA: Insufficient documentation

## 2015-05-24 DIAGNOSIS — M791 Myalgia: Secondary | ICD-10-CM | POA: Insufficient documentation

## 2015-05-24 DIAGNOSIS — Z21 Asymptomatic human immunodeficiency virus [HIV] infection status: Secondary | ICD-10-CM | POA: Insufficient documentation

## 2015-05-24 DIAGNOSIS — R05 Cough: Secondary | ICD-10-CM | POA: Insufficient documentation

## 2015-05-24 DIAGNOSIS — Z720 Tobacco use: Secondary | ICD-10-CM | POA: Insufficient documentation

## 2015-05-24 LAB — COMPREHENSIVE METABOLIC PANEL
ALT: 184 U/L — ABNORMAL HIGH (ref 17–63)
ANION GAP: 8 (ref 5–15)
AST: 154 U/L — ABNORMAL HIGH (ref 15–41)
Albumin: 3.2 g/dL — ABNORMAL LOW (ref 3.5–5.0)
Alkaline Phosphatase: 97 U/L (ref 38–126)
BUN: 7 mg/dL (ref 6–20)
CALCIUM: 8.5 mg/dL — AB (ref 8.9–10.3)
CO2: 23 mmol/L (ref 22–32)
CREATININE: 0.91 mg/dL (ref 0.61–1.24)
Chloride: 106 mmol/L (ref 101–111)
GFR calc non Af Amer: >60 mL/min (ref >60)
GLUCOSE: 82 mg/dL (ref 65–99)
Potassium: 3.7 mmol/L (ref 3.5–5.1)
Sodium: 137 mmol/L (ref 135–145)
Total Bilirubin: 0.8 mg/dL (ref 0.3–1.2)
Total Protein: 8 g/dL (ref 6.5–8.1)

## 2015-05-24 LAB — LIPASE, BLOOD: Lipase: 92 U/L — ABNORMAL HIGH (ref 22–51)

## 2015-05-24 LAB — BRAIN NATRIURETIC PEPTIDE: B Natriuretic Peptide: 55.4 pg/mL (ref 0.0–100.0)

## 2015-05-24 MED ORDER — TRAMADOL HCL 50 MG PO TABS: 50.0000 mg | ORAL_TABLET | Freq: Four times a day (QID) | ORAL | Status: DC | PRN

## 2015-05-24 MED ORDER — OMEPRAZOLE 20 MG PO CPDR: 20.0000 mg | DELAYED_RELEASE_CAPSULE | Freq: Every day | ORAL | Status: DC

## 2015-05-24 NOTE — ED Notes (Signed)
Pt complains of chest pain and shortness of breath for several days, he states that people tell him that he's been passing out ans not knowing it.

## 2015-05-24 NOTE — Discharge Instructions (Signed)
You were seen today for chest pain, nausea, and vomiting. Her chest pain is very atypical for heart attack-like pain. Because of your gastrointestinal symptoms, I suspect gastritis, ulcer, or gastroenteritis as a component. Your lab work is reassuring. He will be given a pain medication and acid reducer for your stomach. You need to follow-up with her primary physician.  Chest Pain (Nonspecific) It is often hard to give a specific diagnosis for the cause of chest pain. There is always a chance that your pain could be related to something serious, such as a heart attack or a blood clot in the lungs. You need to follow up with your health care provider for further evaluation. CAUSES   Heartburn.  Pneumonia or bronchitis.  Anxiety or stress.  Inflammation around your heart (pericarditis) or lung (pleuritis or pleurisy).  A blood clot in the lung.  A collapsed lung (pneumothorax). It can develop suddenly on its own (spontaneous pneumothorax) or from trauma to the chest.  Shingles infection (herpes zoster virus). The chest wall is composed of bones, muscles, and cartilage. Any of these can be the source of the pain.  The bones can be bruised by injury.  The muscles or cartilage can be strained by coughing or overwork.  The cartilage can be affected by inflammation and become sore (costochondritis). DIAGNOSIS  Lab tests or other studies may be needed to find the cause of your pain. Your health care provider may have you take a test called an ambulatory electrocardiogram (ECG). An ECG records your heartbeat patterns over a 24-hour period. You may also have other tests, such as:  Transthoracic echocardiogram (TTE). During echocardiography, sound waves are used to evaluate how blood flows through your heart.  Transesophageal echocardiogram (TEE).  Cardiac monitoring. This allows your health care provider to monitor your heart rate and rhythm in real time.  Holter monitor. This is a portable  device that records your heartbeat and can help diagnose heart arrhythmias. It allows your health care provider to track your heart activity for several days, if needed.  Stress tests by exercise or by giving medicine that makes the heart beat faster. TREATMENT   Treatment depends on what may be causing your chest pain. Treatment may include:  Acid blockers for heartburn.  Anti-inflammatory medicine.  Pain medicine for inflammatory conditions.  Antibiotics if an infection is present.  You may be advised to change lifestyle habits. This includes stopping smoking and avoiding alcohol, caffeine, and chocolate.  You may be advised to keep your head raised (elevated) when sleeping. This reduces the chance of acid going backward from your stomach into your esophagus. Most of the time, nonspecific chest pain will improve within 2-3 days with rest and mild pain medicine.  HOME CARE INSTRUCTIONS   If antibiotics were prescribed, take them as directed. Finish them even if you start to feel better.  For the next few days, avoid physical activities that bring on chest pain. Continue physical activities as directed.  Do not use any tobacco products, including cigarettes, chewing tobacco, or electronic cigarettes.  Avoid drinking alcohol.  Only take medicine as directed by your health care provider.  Follow your health care provider's suggestions for further testing if your chest pain does not go away.  Keep any follow-up appointments you made. If you do not go to an appointment, you could develop lasting (chronic) problems with pain. If there is any problem keeping an appointment, call to reschedule. SEEK MEDICAL CARE IF:   Your chest pain  does not go away, even after treatment.  You have a rash with blisters on your chest.  You have a fever. SEEK IMMEDIATE MEDICAL CARE IF:   You have increased chest pain or pain that spreads to your arm, neck, jaw, back, or abdomen.  You have  shortness of breath.  You have an increasing cough, or you cough up blood.  You have severe back or abdominal pain.  You feel nauseous or vomit.  You have severe weakness.  You faint.  You have chills. This is an emergency. Do not wait to see if the pain will go away. Get medical help at once. Call your local emergency services (911 in [place] Do not drive yourself to the hospital. MAKE SURE YOU:   Understand these instructions.  Will watch your condition.  Will get help right away if you are not doing well or get worse. Document Released: 08/14/2005 Document Revised: 11/09/2013 Document Reviewed: 06/09/2008 [facility] Patient Information 2015 [place]. This information is not intended to replace advice given to you by your health care provider. Make sure you discuss any questions you have with your health care provider. Nausea and Vomiting Nausea is a sick feeling that often comes before throwing up (vomiting). Vomiting is a reflex where stomach contents come out of your mouth. Vomiting can cause severe loss of body fluids (dehydration). Children and elderly adults can become dehydrated quickly, especially if they also have diarrhea. Nausea and vomiting are symptoms of a condition or disease. It is important to find the cause of your symptoms. CAUSES   Direct irritation of the stomach lining. This irritation can result from increased acid production (gastroesophageal reflux disease), infection, food poisoning, taking certain medicines (such as nonsteroidal anti-inflammatory drugs), alcohol use, or tobacco use.  Signals from the brain.These signals could be caused by a headache, heat exposure, an inner ear disturbance, increased pressure in the brain from injury, infection, a tumor, or a concussion, pain, emotional stimulus, or metabolic problems.  An obstruction in the gastrointestinal tract (bowel obstruction).  Illnesses such as diabetes, hepatitis, gallbladder problems,  appendicitis, kidney problems, cancer, sepsis, atypical symptoms of a heart attack, or eating disorders.  Medical treatments such as chemotherapy and radiation.  Receiving medicine that makes you sleep (general anesthetic) during surgery. DIAGNOSIS Your caregiver may ask for tests to be done if the problems do not improve after a few days. Tests may also be done if symptoms are severe or if the reason for the nausea and vomiting is not clear. Tests may include:  Urine tests.  Blood tests.  Stool tests.  Cultures (to look for evidence of infection).  X-rays or other imaging studies. Test results can help your caregiver make decisions about treatment or the need for additional tests. TREATMENT You need to stay well hydrated. Drink frequently but in small amounts.You may wish to drink water, sports drinks, clear broth, or eat frozen ice pops or gelatin dessert to help stay hydrated.When you eat, eating slowly may help prevent nausea.There are also some antinausea medicines that may help prevent nausea. HOME CARE INSTRUCTIONS   Take all medicine as directed by your caregiver.  If you do not have an appetite, do not force yourself to eat. However, you must continue to drink fluids.  If you have an appetite, eat a normal diet unless your caregiver tells you differently.  Eat a variety of complex carbohydrates (rice, wheat, potatoes, bread), lean meats, yogurt, fruits, and vegetables.  Avoid high-fat foods because they are  more difficult to digest.  Drink enough water and fluids to keep your urine clear or pale yellow.  If you are dehydrated, ask your caregiver for specific rehydration instructions. Signs of dehydration may include:  Severe thirst.  Dry lips and mouth.  Dizziness.  Dark urine.  Decreasing urine frequency and amount.  Confusion.  Rapid breathing or pulse. SEEK IMMEDIATE MEDICAL CARE IF:   You have blood or brown flecks (like coffee grounds) in your  vomit.  You have black or bloody stools.  You have a severe headache or stiff neck.  You are confused.  You have severe abdominal pain.  You have chest pain or trouble breathing.  You do not urinate at least once every 8 hours.  You develop cold or clammy skin.  You continue to vomit for longer than 24 to 48 hours.  You have a fever. MAKE SURE YOU:   Understand these instructions.  Will watch your condition.  Will get help right away if you are not doing well or get worse. Document Released: 11/04/2005 Document Revised: 01/27/2012 Document Reviewed: 04/03/2011 [facility] Patient Information 2015 [place], [place]. This information is not intended to replace advice given to you by your health care provider. Make sure you discuss any questions you have with your health care provider.

## 2015-05-24 NOTE — ED Notes (Signed)
Discharge vitals completed, IV removed 

## 2015-05-25 ENCOUNTER — Emergency Department (HOSPITAL_COMMUNITY): Payer: Self-pay

## 2015-05-25 LAB — D-DIMER, QUANTITATIVE: D-Dimer, Quant: 0.34 ug/mL-FEU (ref 0.00–0.48)

## 2015-05-25 LAB — CBC WITH DIFFERENTIAL/PLATELET
BASOS ABS: 0 10*3/uL (ref 0.0–0.1)
Basophils Relative: 0 % (ref 0–1)
Eosinophils Absolute: 0.1 10*3/uL (ref 0.0–0.7)
Eosinophils Relative: 2 % (ref 0–5)
HCT: 36.7 % — ABNORMAL LOW (ref 39.0–52.0)
HEMOGLOBIN: 12.6 g/dL — AB (ref 13.0–17.0)
LYMPHS ABS: 1.6 10*3/uL (ref 0.7–4.0)
Lymphocytes Relative: 48 % — ABNORMAL HIGH (ref 12–46)
MCH: 34.1 pg — AB (ref 26.0–34.0)
MCHC: 34.3 g/dL (ref 30.0–36.0)
MCV: 99.5 fL (ref 78.0–100.0)
Monocytes Absolute: 0.3 10*3/uL (ref 0.1–1.0)
Monocytes Relative: 8 % (ref 3–12)
NEUTROS ABS: 1.4 10*3/uL — AB (ref 1.7–7.7)
NEUTROS PCT: 43 % (ref 43–77)
PLATELETS: 119 10*3/uL — AB (ref 150–400)
RBC: 3.69 MIL/uL — ABNORMAL LOW (ref 4.22–5.81)
RDW: 13 % (ref 11.5–15.5)
WBC: 3.4 10*3/uL — ABNORMAL LOW (ref 4.0–10.5)

## 2015-05-25 LAB — I-STAT TROPONIN, ED
Troponin i, poc: 0 ng/mL (ref 0.00–0.08)
Troponin i, poc: 0.01 ng/mL (ref 0.00–0.08)

## 2015-05-25 LAB — COMPREHENSIVE METABOLIC PANEL
ALK PHOS: 92 U/L (ref 38–126)
ALT: 162 U/L — ABNORMAL HIGH (ref 17–63)
ANION GAP: 6 (ref 5–15)
AST: 130 U/L — ABNORMAL HIGH (ref 15–41)
Albumin: 3.3 g/dL — ABNORMAL LOW (ref 3.5–5.0)
BILIRUBIN TOTAL: 0.6 mg/dL (ref 0.3–1.2)
BUN: 12 mg/dL (ref 6–20)
CALCIUM: 8.5 mg/dL — AB (ref 8.9–10.3)
CO2: 26 mmol/L (ref 22–32)
Chloride: 106 mmol/L (ref 101–111)
Creatinine, Ser: 1.16 mg/dL (ref 0.61–1.24)
GFR calc non Af Amer: >60 mL/min (ref >60)
GLUCOSE: 122 mg/dL — AB (ref 65–99)
POTASSIUM: 3.7 mmol/L (ref 3.5–5.1)
Sodium: 138 mmol/L (ref 135–145)
TOTAL PROTEIN: 7.5 g/dL (ref 6.5–8.1)

## 2015-05-25 MED ORDER — METHOCARBAMOL 500 MG PO TABS: 1000.0000 mg | ORAL_TABLET | Freq: Three times a day (TID) | ORAL | Status: DC | PRN

## 2015-05-25 NOTE — ED Provider Notes (Signed)
CSN: [id number]     Arrival date & time 05/24/15  2246 History   First MD Initiated Contact with Patient 05/25/15 0013     Chief Complaint  Patient presents with  . Chest Pain     (Consider location/radiation/quality/duration/timing/severity/associated sxs/prior Treatment) HPI Patient presents with several months of left-sided chest pain. He's been evaluated multiple times in the emergency department for this pain. He describes the pain as worse with lying flat and movement. He's had a mild nonproductive cough. Endorses some shortness of breath. No fever or chills. Patient also states he was having "blacking out episodes" this evening. He is amnestic to the event. No trauma. No lightheadedness or nausea. Patient has history of HIV. Last viral load greater than 10,000 and C4 count 100. Past Medical History  Diagnosis Date  . HIV (human immunodeficiency virus infection)   . Hepatitis B   . Incarceration    Past Surgical History  Procedure Laterality Date  . I&d extremity  05/21/2012    Procedure: IRRIGATION AND DEBRIDEMENT EXTREMITY;  Surgeon: [name], MD;  Location: [facility] OR;  Service: Orthopedics;  Laterality: Right;   History reviewed. No pertinent family history. History  Substance Use Topics  . Smoking status: Current Every Day Smoker -- 1.50 packs/day  . Smokeless tobacco: Never Used  . Alcohol Use: 1.0 oz/week    2 drink(s) per week     Comment: occassional    Review of Systems  Constitutional: Negative for fever and chills.  Respiratory: Positive for cough and shortness of breath. Negative for wheezing.   Cardiovascular: Positive for chest pain. Negative for palpitations and leg swelling.  Gastrointestinal: Negative for nausea, vomiting, abdominal pain and diarrhea.  Genitourinary: Negative for dysuria, frequency, hematuria and flank pain.  Musculoskeletal: Positive for myalgias and back pain. Negative for neck pain and neck stiffness.  Skin: Negative for rash and  wound.  Neurological: Negative for dizziness, weakness, light-headedness, numbness and headaches.  All other systems reviewed and are negative.     Allergies  Review of patient's allergies indicates no known allergies.  Home Medications   Prior to Admission medications   Medication Sig Start Date End Date Taking? Authorizing Provider  darunavir-cobicistat (PREZCOBIX) 800-150 MG per tablet Take 1 tablet by mouth daily. Patient taking differently: Take 1 tablet by mouth every morning.  02/13/15  Yes Randall Hiss, MD  emtricitabine-tenofovir (TRUVADA) 200-300 MG per tablet Take 1 tablet by mouth daily. Patient taking differently: Take 1 tablet by mouth every morning.  02/09/15 02/09/16 Yes Randall Hiss, MD  Gabapentin, PHN, 300 MG TABS Take 300 mg by mouth at bedtime as needed. Patient taking differently: Take 300 mg by mouth daily.  06/30/13  Yes Randall Hiss, MD  doxycycline (VIBRAMYCIN) 100 MG capsule Take 1 capsule (100 mg total) by mouth 2 (two) times daily. One po bid x 7 days Patient not taking: Reported on 05/24/2015 05/01/15   Purvis Sheffield, MD  methocarbamol (ROBAXIN) 500 MG tablet Take 2 tablets (1,000 mg total) by mouth every 8 (eight) hours as needed for muscle spasms. 05/25/15   Loren Racer, MD  omeprazole (PRILOSEC) 20 MG capsule Take 1 capsule (20 mg total) by mouth daily. 05/24/15   Shon Baton, MD  oxyCODONE-acetaminophen (PERCOCET) 5-325 MG per tablet Take 1 tablet by mouth every 6 (six) hours as needed for severe pain. Patient not taking: Reported on 05/25/2015 03/16/15   Lenell Antu, MD  predniSONE (DELTASONE) 50 MG tablet Take  1 tablet (50 mg total) by mouth daily. Patient not taking: Reported on 02/14/2015 10/04/14   Dione Boozeavid Glick, MD  traMADol (ULTRAM) 50 MG tablet Take 1 tablet (50 mg total) by mouth every 6 (six) hours as needed. 05/24/15   Shon Batonourtney F Horton, MD   BP 120/73 mmHg  Pulse 57  Temp(Src) 98.1 F (36.7 C) (Oral)  Resp 9  Ht 5\' 10"   (1.778 m)  Wt 165 lb (74.844 kg)  BMI 23.68 kg/m2  SpO2 100% Physical Exam  Constitutional: He is oriented to person, place, and time. He appears well-developed and well-nourished. No distress.  HENT:  Head: Normocephalic and atraumatic.  Mouth/Throat: Oropharynx is clear and moist.  Eyes: EOM are normal. Pupils are equal, round, and reactive to light.  Neck: Normal range of motion. Neck supple.  Cardiovascular: Normal rate and regular rhythm.   Pulmonary/Chest: Effort normal and breath sounds normal. No respiratory distress. He has no wheezes. He has no rales. He exhibits tenderness (patient's chest tenderness is reproduced with palpation over the left pectoralis muscle).  Abdominal: Soft. Bowel sounds are normal. He exhibits no distension and no mass. There is no tenderness. There is no rebound and no guarding.  Musculoskeletal: Normal range of motion. He exhibits tenderness. He exhibits no edema.  Patient with tenderness to palpation to the medial surface of the left scapula. No midline thoracic or lumbar tenderness. No calf swelling or tenderness.  Neurological: He is alert and oriented to person, place, and time.  Moves all extremities without deficit. Sensation is grossly intact.  Skin: Skin is warm and dry. No rash noted. No erythema.  Psychiatric: He has a normal mood and affect. His behavior is normal.  Nursing note and vitals reviewed.   ED Course  Procedures (including critical care time) Labs Review Labs Reviewed  CBC WITH DIFFERENTIAL/PLATELET - Abnormal; Notable for the following:    WBC 3.4 (*)    RBC 3.69 (*)    Hemoglobin 12.6 (*)    HCT 36.7 (*)    MCH 34.1 (*)    Platelets 119 (*)    Neutro Abs 1.4 (*)    Lymphocytes Relative 48 (*)    All other components within normal limits  COMPREHENSIVE METABOLIC PANEL - Abnormal; Notable for the following:    Glucose, Bld 122 (*)    Calcium 8.5 (*)    Albumin 3.3 (*)    AST 130 (*)    ALT 162 (*)    All other  components within normal limits  D-DIMER, QUANTITATIVE (NOT AT [facility])  I-STAT TROPOININ, ED  Rosezena SensorI-STAT TROPOININ, ED    Imaging Review Dg Chest 2 View  05/25/2015   CLINICAL DATA:  Acute onset of left-sided chest pain and shortness of breath. Syncope. Initial encounter.  EXAM: CHEST  2 VIEW  COMPARISON:  Chest radiograph from 05/23/2015  FINDINGS: The lungs are well-aerated. Large bilateral bullae are seen, particularly at the upper lung lobes. There is no evidence of focal opacification, pleural effusion or pneumothorax.  The heart is normal in size; the mediastinal contour is within normal limits. No acute osseous abnormalities are seen.  IMPRESSION: Bullous emphysema again noted; no acute cardiopulmonary process seen.   Electronically Signed   By: Roanna RaiderJeffery  Chang M.D.   On: 05/25/2015 00:20   Dg Chest 2 View  05/23/2015   CLINICAL DATA:  Chest pain.  Emphysema.  EXAM: CHEST  2 VIEW  COMPARISON:  04/30/2015  FINDINGS: Severe bullous emphysema, greater at the right apex. No  convincing superimposed pneumothorax. There is no edema, consolidation, or effusion. Normal heart size and mediastinal contours.  IMPRESSION: Bullous emphysema without acute superimposed finding.   Electronically Signed   By: Marnee Spring M.D.   On: 05/23/2015 23:31     EKG Interpretation   Date/Time:  Wednesday May 24 2015 23:28:38 EDT Ventricular Rate:  77 PR Interval:  142 QRS Duration: 87 QT Interval:  397 QTC Calculation: 449 R Axis:   34 Text Interpretation:  Sinus rhythm RSR' in V1 or V2, right VCD or RVH  Confirmed by Catalyna Reilly  MD, Zackerie Sara ([id number]) on 05/25/2015 12:30:03 AM      MDM   Final diagnoses:  Chest wall pain    Chest pain is chronic in nature. He's been evaluated multiple times for this. He has troponin 2 which are normal. EKG without any evidence of ischemia. He has a normal d-dimer. Chest x-ray shows stable bullous emphysema. Questional syncopal episodes. Has had no syncope in the emergency  department. No orthostasis. Normal vital signs. Patient has been screened and can be discharged to follow-up with primary physician. Return precautions given.    Loren Racer, MD 05/25/15 [phone]

## 2015-05-25 NOTE — Discharge Instructions (Signed)

## 2015-05-29 ENCOUNTER — Emergency Department (HOSPITAL_COMMUNITY)
Admission: EM | Admit: 2015-05-29 | Discharge: 2015-05-29 | Disposition: A | Discharge status: Home / Self Care | Payer: Self-pay | Attending: Emergency Medicine | Admitting: Emergency Medicine

## 2015-05-29 ENCOUNTER — Emergency Department (HOSPITAL_COMMUNITY): Payer: Self-pay

## 2015-05-29 ENCOUNTER — Encounter (HOSPITAL_COMMUNITY): Payer: Self-pay | Admitting: Emergency Medicine

## 2015-05-29 DIAGNOSIS — Z9114 Patient's other noncompliance with medication regimen: Secondary | ICD-10-CM

## 2015-05-29 DIAGNOSIS — Z720 Tobacco use: Secondary | ICD-10-CM | POA: Insufficient documentation

## 2015-05-29 DIAGNOSIS — Z8619 Personal history of other infectious and parasitic diseases: Secondary | ICD-10-CM | POA: Insufficient documentation

## 2015-05-29 DIAGNOSIS — Z79899 Other long term (current) drug therapy: Secondary | ICD-10-CM | POA: Insufficient documentation

## 2015-05-29 DIAGNOSIS — J441 Chronic obstructive pulmonary disease with (acute) exacerbation: Secondary | ICD-10-CM | POA: Insufficient documentation

## 2015-05-29 DIAGNOSIS — Z9119 Patient's noncompliance with other medical treatment and regimen: Secondary | ICD-10-CM | POA: Insufficient documentation

## 2015-05-29 HISTORY — DX: Emphysema, unspecified: J43.9

## 2015-05-29 LAB — BASIC METABOLIC PANEL
Anion gap: 7 (ref 5–15)
BUN: 14 mg/dL (ref 6–20)
CALCIUM: 8.8 mg/dL — AB (ref 8.9–10.3)
CHLORIDE: 106 mmol/L (ref 101–111)
CO2: 26 mmol/L (ref 22–32)
Creatinine, Ser: 1.04 mg/dL (ref 0.61–1.24)
GFR calc Af Amer: >60 mL/min (ref >60)
GFR calc non Af Amer: >60 mL/min (ref >60)
Glucose, Bld: 94 mg/dL (ref 65–99)
Potassium: 3.7 mmol/L (ref 3.5–5.1)
SODIUM: 139 mmol/L (ref 135–145)

## 2015-05-29 LAB — BRAIN NATRIURETIC PEPTIDE: B Natriuretic Peptide: 39.2 pg/mL (ref 0.0–100.0)

## 2015-05-29 LAB — CBC
HCT: 37.6 % — ABNORMAL LOW (ref 39.0–52.0)
Hemoglobin: 13 g/dL (ref 13.0–17.0)
MCH: 33.8 pg (ref 26.0–34.0)
MCHC: 34.6 g/dL (ref 30.0–36.0)
MCV: 97.7 fL (ref 78.0–100.0)
Platelets: 111 10*3/uL — ABNORMAL LOW (ref 150–400)
RBC: 3.85 MIL/uL — ABNORMAL LOW (ref 4.22–5.81)
RDW: 13 % (ref 11.5–15.5)
WBC: 4.2 10*3/uL (ref 4.0–10.5)

## 2015-05-29 LAB — I-STAT TROPONIN, ED: Troponin i, poc: 0 ng/mL (ref 0.00–0.08)

## 2015-05-29 MED ORDER — IPRATROPIUM BROMIDE 0.02 % IN SOLN
0.5000 mg | Freq: Once | RESPIRATORY_TRACT | Status: AC
  Administered 2015-05-29: 0.5 mg via RESPIRATORY_TRACT
  Filled 2015-05-29: qty 2.5

## 2015-05-29 MED ORDER — ALBUTEROL SULFATE HFA 108 (90 BASE) MCG/ACT IN AERS
2.0000 | INHALATION_SPRAY | RESPIRATORY_TRACT | Status: DC
  Administered 2015-05-29: 2 via RESPIRATORY_TRACT
  Filled 2015-05-29: qty 6.7

## 2015-05-29 MED ORDER — PREDNISONE 20 MG PO TABS
60.0000 mg | ORAL_TABLET | Freq: Once | ORAL | Status: AC
  Administered 2015-05-29: 60 mg via ORAL
  Filled 2015-05-29: qty 3

## 2015-05-29 MED ORDER — PREDNISONE 10 MG PO TABS: 60.0000 mg | ORAL_TABLET | Freq: Every day | ORAL | Status: DC

## 2015-05-29 MED ORDER — ALBUTEROL SULFATE (2.5 MG/3ML) 0.083% IN NEBU
5.0000 mg | INHALATION_SOLUTION | Freq: Once | RESPIRATORY_TRACT | Status: AC
  Administered 2015-05-29: 5 mg via RESPIRATORY_TRACT
  Filled 2015-05-29: qty 6

## 2015-05-29 NOTE — ED Notes (Signed)
C/o sob and "a little chest pain that I always have."  Pt states he is out of inhalers and needs a breathing treatment.

## 2015-05-29 NOTE — Discharge Instructions (Signed)

## 2015-05-29 NOTE — ED Provider Notes (Signed)
CSN: [id number]     Arrival date & time 05/29/15  0118 History  This chart was scribed for Azalia Bilis, MD by Octavia Heir, ED Scribe. This patient was seen in room A01C/A01C and the patient's care was started at 1:49 AM.    Chief Complaint  Patient presents with  . Shortness of Breath       The history is provided by the patient. No language interpreter was used.   HPI Comments: Curtis Hodge is a 49 y.o. male who has a hx of COPD/Emphysema, HIV and Hepatitis B presents to the Emergency Department complaining of constant, gradual worsening shortness of breath onset today. Pt notes using he ran out of his inhalers and needs a breathing treatment. He denies fever, chills, coughing. Pt is a smoker.    Past Medical History  Diagnosis Date  . HIV (human immunodeficiency virus infection)   . Hepatitis B   . Incarceration   . Emphysema/COPD    Past Surgical History  Procedure Laterality Date  . I&d extremity  05/21/2012    Procedure: IRRIGATION AND DEBRIDEMENT EXTREMITY;  Surgeon: [name], MD;  Location: [facility] OR;  Service: Orthopedics;  Laterality: Right;   No family history on file. History  Substance Use Topics  . Smoking status: Current Every Day Smoker -- 1.50 packs/day  . Smokeless tobacco: Never Used  . Alcohol Use: 1.0 oz/week    2 Standard drinks or equivalent per week     Comment: occassional    Review of Systems  A complete 10 system review of systems was obtained and all systems are negative except as noted in the HPI and PMH.    Allergies  Review of patient's allergies indicates no known allergies.  Home Medications   Prior to Admission medications   Medication Sig Start Date End Date Taking? Authorizing Provider  darunavir-cobicistat (PREZCOBIX) 800-150 MG per tablet Take 1 tablet by mouth daily. Patient taking differently: Take 1 tablet by mouth every morning.  02/13/15   Randall Hiss, MD  doxycycline (VIBRAMYCIN) 100 MG capsule Take 1 capsule  (100 mg total) by mouth 2 (two) times daily. One po bid x 7 days Patient not taking: Reported on 05/24/2015 05/01/15   Purvis Sheffield, MD  emtricitabine-tenofovir (TRUVADA) 200-300 MG per tablet Take 1 tablet by mouth daily. Patient taking differently: Take 1 tablet by mouth every morning.  02/09/15 02/09/16  Randall Hiss, MD  Gabapentin, PHN, 300 MG TABS Take 300 mg by mouth at bedtime as needed. Patient taking differently: Take 300 mg by mouth daily.  06/30/13   Randall Hiss, MD  methocarbamol (ROBAXIN) 500 MG tablet Take 2 tablets (1,000 mg total) by mouth every 8 (eight) hours as needed for muscle spasms. 05/25/15   Loren Racer, MD  omeprazole (PRILOSEC) 20 MG capsule Take 1 capsule (20 mg total) by mouth daily. 05/24/15   Shon Baton, MD  oxyCODONE-acetaminophen (PERCOCET) 5-325 MG per tablet Take 1 tablet by mouth every 6 (six) hours as needed for severe pain. Patient not taking: Reported on 05/25/2015 03/16/15   Lenell Antu, MD  predniSONE (DELTASONE) 50 MG tablet Take 1 tablet (50 mg total) by mouth daily. Patient not taking: Reported on 02/14/2015 10/04/14   Dione Booze, MD  traMADol (ULTRAM) 50 MG tablet Take 1 tablet (50 mg total) by mouth every 6 (six) hours as needed. 05/24/15   Shon Baton, MD   Triage vitals: BP 133/96 mmHg  Pulse 59  Temp(Src) 97.8 F (36.6 C) (Oral)  Resp 16  Wt 154 lb 9 oz (70.109 kg)  SpO2 94% Physical Exam  Constitutional: He is oriented to person, place, and time. He appears well-developed and well-nourished.  HENT:  Head: Normocephalic and atraumatic.  Eyes: EOM are normal.  Neck: Normal range of motion.  Cardiovascular: Normal rate, regular rhythm, normal heart sounds and intact distal pulses.   Pulmonary/Chest: Effort normal and breath sounds normal. No respiratory distress.  Mild wheezing bilaterally  Abdominal: Soft. He exhibits no distension. There is no tenderness.  Musculoskeletal: Normal range of motion.  Neurological: He  is alert and oriented to person, place, and time.  Skin: Skin is warm and dry.  Psychiatric: He has a normal mood and affect. Judgment normal.  Nursing note and vitals reviewed.   ED Course  Procedures  DIAGNOSTIC STUDIES: Oxygen Saturation is 94% on RA, low by my interpretation.  COORDINATION OF CARE:  1:52 AM Discussed treatment plan which includes EKG, DG chest, breathing treatment with pt at bedside and pt agreed to plan.  Labs Review Labs Reviewed  CBC - Abnormal; Notable for the following:    RBC 3.85 (*)    HCT 37.6 (*)    Platelets 111 (*)    All other components within normal limits  BASIC METABOLIC PANEL - Abnormal; Notable for the following:    Calcium 8.8 (*)    All other components within normal limits  BRAIN NATRIURETIC PEPTIDE  I-STAT TROPOININ, ED    Imaging Review Dg Chest Port 1 View  05/29/2015   CLINICAL DATA:  Shortness of breath. Recent diagnosis of emphysema and COPD. Current smoker.  EXAM: PORTABLE CHEST - 1 VIEW  COMPARISON:  05/25/2015  FINDINGS: Normal heart size and pulmonary vascularity. Bullous emphysematous changes in the lungs most prominent on the right. No focal airspace disease. No blunting of costophrenic angles. No pneumothorax. Mediastinal contours appear intact.  IMPRESSION: Emphysematous changes with bilateral bullous changes. No evidence of active pulmonary disease.   Electronically Signed   By: Burman NievesWilliam  Stevens M.D.   On: 05/29/2015 01:45  I personally reviewed the imaging tests through PACS system I reviewed available ER/hospitalization records through the EMR    EKG Interpretation   Date/Time:  Monday May 29 2015 01:21:50 EDT Ventricular Rate:  73 PR Interval:  138 QRS Duration: 84 QT Interval:  424 QTC Calculation: 467 R Axis:   68 Text Interpretation:  Normal sinus rhythm with sinus arrhythmia Normal ECG  No significant change was found Confirmed by Keyana Guevara  MD, Eleazar Kimmey ([id number]) on  05/29/2015 1:52:59 AM      MDM   Final  diagnoses:  None    COPD exacerbation.  Patient is feeling better after nebulized albuterol and Atrovent.  Discharge home with prednisone, albuterol inhaler.  Primary care follow-up.  I strongly recommended that the patient stop smoking cigarettes.  He understands this.    I personally performed the services described in this documentation, which was scribed in my presence. The recorded information has been reviewed and is accurate.     Azalia BilisKevin Caulder Wehner, MD 05/29/15 [phone]

## 2015-05-29 NOTE — ED Notes (Signed)
Campos, MD at bedside.  

## 2015-05-29 NOTE — ED Notes (Signed)
Portable xray at bedside.

## 2015-09-27 ENCOUNTER — Encounter (HOSPITAL_COMMUNITY): Payer: Self-pay | Admitting: *Deleted

## 2015-09-27 ENCOUNTER — Emergency Department (HOSPITAL_COMMUNITY)
Admission: EM | Admit: 2015-09-27 | Discharge: 2015-09-27 | Disposition: A | Discharge status: Home / Self Care | Payer: Self-pay | Attending: Emergency Medicine | Admitting: Emergency Medicine

## 2015-09-27 ENCOUNTER — Encounter (HOSPITAL_COMMUNITY): Payer: Self-pay | Admitting: Family Medicine

## 2015-09-27 ENCOUNTER — Emergency Department (HOSPITAL_COMMUNITY): Payer: Self-pay

## 2015-09-27 DIAGNOSIS — J441 Chronic obstructive pulmonary disease with (acute) exacerbation: Secondary | ICD-10-CM | POA: Insufficient documentation

## 2015-09-27 DIAGNOSIS — R0789 Other chest pain: Secondary | ICD-10-CM | POA: Insufficient documentation

## 2015-09-27 DIAGNOSIS — Z720 Tobacco use: Secondary | ICD-10-CM | POA: Insufficient documentation

## 2015-09-27 DIAGNOSIS — Z79899 Other long term (current) drug therapy: Secondary | ICD-10-CM | POA: Insufficient documentation

## 2015-09-27 DIAGNOSIS — R55 Syncope and collapse: Secondary | ICD-10-CM | POA: Insufficient documentation

## 2015-09-27 DIAGNOSIS — J449 Chronic obstructive pulmonary disease, unspecified: Secondary | ICD-10-CM | POA: Insufficient documentation

## 2015-09-27 DIAGNOSIS — Z21 Asymptomatic human immunodeficiency virus [HIV] infection status: Secondary | ICD-10-CM | POA: Insufficient documentation

## 2015-09-27 DIAGNOSIS — B20 Human immunodeficiency virus [HIV] disease: Secondary | ICD-10-CM | POA: Insufficient documentation

## 2015-09-27 LAB — CBC WITH DIFFERENTIAL/PLATELET
BASOS ABS: 0 10*3/uL (ref 0.0–0.1)
BASOS PCT: 0 %
Eosinophils Absolute: 0 10*3/uL (ref 0.0–0.7)
Eosinophils Relative: 0 %
HEMATOCRIT: 38.6 % — AB (ref 39.0–52.0)
HEMOGLOBIN: 13.3 g/dL (ref 13.0–17.0)
LYMPHS PCT: 35 %
Lymphs Abs: 1.6 10*3/uL (ref 0.7–4.0)
MCH: 34.3 pg — ABNORMAL HIGH (ref 26.0–34.0)
MCHC: 34.5 g/dL (ref 30.0–36.0)
MCV: 99.5 fL (ref 78.0–100.0)
MONOS PCT: 9 %
Monocytes Absolute: 0.4 10*3/uL (ref 0.1–1.0)
NEUTROS ABS: 2.6 10*3/uL (ref 1.7–7.7)
NEUTROS PCT: 56 %
Platelets: 168 10*3/uL (ref 150–400)
RBC: 3.88 MIL/uL — ABNORMAL LOW (ref 4.22–5.81)
RDW: 12.5 % (ref 11.5–15.5)
WBC: 4.6 10*3/uL (ref 4.0–10.5)

## 2015-09-27 LAB — COMPREHENSIVE METABOLIC PANEL
ALBUMIN: 3.8 g/dL (ref 3.5–5.0)
ALK PHOS: 92 U/L (ref 38–126)
ALT: 34 U/L (ref 17–63)
AST: 37 U/L (ref 15–41)
Anion gap: 4 — ABNORMAL LOW (ref 5–15)
BUN: 11 mg/dL (ref 6–20)
CALCIUM: 9.3 mg/dL (ref 8.9–10.3)
CO2: 30 mmol/L (ref 22–32)
CREATININE: 1.25 mg/dL — AB (ref 0.61–1.24)
Chloride: 105 mmol/L (ref 101–111)
GFR calc Af Amer: >60 mL/min (ref >60)
GFR calc non Af Amer: >60 mL/min (ref >60)
GLUCOSE: 96 mg/dL (ref 65–99)
Potassium: 4.9 mmol/L (ref 3.5–5.1)
SODIUM: 139 mmol/L (ref 135–145)
Total Bilirubin: 0.8 mg/dL (ref 0.3–1.2)
Total Protein: 7.8 g/dL (ref 6.5–8.1)

## 2015-09-27 LAB — TROPONIN I: Troponin I: <0.03 ng/mL (ref <0.031)

## 2015-09-27 LAB — D-DIMER, QUANTITATIVE: D-Dimer, Quant: <0.27 ug/mL-FEU (ref 0.00–0.48)

## 2015-09-27 LAB — BRAIN NATRIURETIC PEPTIDE: B Natriuretic Peptide: 13.6 pg/mL (ref 0.0–100.0)

## 2015-09-27 MED ORDER — TRAMADOL HCL 50 MG PO TABS: 50.0000 mg | ORAL_TABLET | Freq: Four times a day (QID) | ORAL | Status: DC | PRN

## 2015-09-27 MED ORDER — IBUPROFEN 800 MG PO TABS: 800.0000 mg | ORAL_TABLET | Freq: Three times a day (TID) | ORAL | Status: DC

## 2015-09-27 MED ORDER — ONDANSETRON HCL 4 MG/2ML IJ SOLN
4.0000 mg | Freq: Once | INTRAMUSCULAR | Status: AC
  Administered 2015-09-27: 4 mg via INTRAVENOUS
  Filled 2015-09-27: qty 2

## 2015-09-27 MED ORDER — MORPHINE SULFATE (PF) 4 MG/ML IV SOLN
4.0000 mg | Freq: Once | INTRAVENOUS | Status: AC
  Administered 2015-09-27: 4 mg via INTRAVENOUS
  Filled 2015-09-27: qty 1

## 2015-09-27 MED ORDER — IBUPROFEN 600 MG PO TABS: 600.0000 mg | ORAL_TABLET | Freq: Four times a day (QID) | ORAL | Status: DC | PRN

## 2015-09-27 MED ORDER — IBUPROFEN 400 MG PO TABS
600.0000 mg | ORAL_TABLET | Freq: Once | ORAL | Status: AC
  Administered 2015-09-27: 600 mg via ORAL
  Filled 2015-09-27 (×2): qty 1

## 2015-09-27 MED ORDER — KETOROLAC TROMETHAMINE 30 MG/ML IJ SOLN
30.0000 mg | Freq: Once | INTRAMUSCULAR | Status: AC
  Administered 2015-09-27: 30 mg via INTRAVENOUS
  Filled 2015-09-27: qty 1

## 2015-09-27 NOTE — Discharge Instructions (Signed)
Chest Wall Pain Chest wall pain is pain in or around the bones and muscles of your chest. Sometimes, an injury causes this pain. Sometimes, the cause may not be known. This pain may take several weeks or longer to get better. HOME CARE INSTRUCTIONS  Pay attention to any changes in your symptoms. Take these actions to help with your pain:   Rest as told by your health care provider.   Avoid activities that cause pain. These include any activities that use your chest muscles or your abdominal and side muscles to lift heavy items.   If directed, apply ice to the painful area:  Put ice in a plastic bag.  Place a towel between your skin and the bag.  Leave the ice on for 20 minutes, 2-3 times per day.  Take over-the-counter and prescription medicines only as told by your health care provider.  Do not use tobacco products, including cigarettes, chewing tobacco, and e-cigarettes. If you need help quitting, ask your health care provider.  Keep all follow-up visits as told by your health care provider. This is important. SEEK MEDICAL CARE IF:  You have a fever.  Your chest pain becomes worse.  You have new symptoms. SEEK IMMEDIATE MEDICAL CARE IF:  You have nausea or vomiting.  You feel sweaty or light-headed.  You have a cough with phlegm (sputum) or you cough up blood.  You develop shortness of breath.   This information is not intended to replace advice given to you by your health care provider. Make sure you discuss any questions you have with your health care provider.   Document Released: 11/04/2005 Document Revised: 07/26/2015 Document Reviewed: 01/30/2015 Elsevier Interactive Patient Education 2016 [employer]  Chest Wall Pain Chest wall pain is pain in or around the bones and muscles of your chest. Sometimes, an injury causes this pain. Sometimes, the cause may not be known. This pain may take several weeks or longer to get better. HOME CARE INSTRUCTIONS  Pay  attention to any changes in your symptoms. Take these actions to help with your pain:   Rest as told by your health care provider.   Avoid activities that cause pain. These include any activities that use your chest muscles or your abdominal and side muscles to lift heavy items.   If directed, apply ice to the painful area:  Put ice in a plastic bag.  Place a towel between your skin and the bag.  Leave the ice on for 20 minutes, 2-3 times per day.  Take over-the-counter and prescription medicines only as told by your health care provider.  Do not use tobacco products, including cigarettes, chewing tobacco, and e-cigarettes. If you need help quitting, ask your health care provider.  Keep all follow-up visits as told by your health care provider. This is important. SEEK MEDICAL CARE IF:  You have a fever.  Your chest pain becomes worse.  You have new symptoms. SEEK IMMEDIATE MEDICAL CARE IF:  You have nausea or vomiting.  You feel sweaty or light-headed.  You have a cough with phlegm (sputum) or you cough up blood.  You develop shortness of breath.   This information is not intended to replace advice given to you by your health care provider. Make sure you discuss any questions you have with your health care provider.   Document Released: 11/04/2005 Document Revised: 07/26/2015 Document Reviewed: 01/30/2015 Elsevier Interactive Patient Education [employer]

## 2015-09-27 NOTE — ED Provider Notes (Signed)
CSN: [id number]     Arrival date & time 09/27/15  0221 History  By signing my name below, I, [facility] Hodge, attest that this documentation has been prepared under the direction and in the presence of Gilda Creasehristopher J Jahlani Lorentz, MD. Electronically Signed: Randell PatientMarrissa Hodge, ED Scribe. 09/27/2015. 2:48 AM    Chief Complaint  Patient presents with  . Chest Pain  . Loss of Consciousness    The history is provided by the patient. No language interpreter was used.   HPI Comments: Curtis Hodge is a 49 y.o. male with past medical history of HIV, emphysema who presents to the Emergency Department complaining of sudden onset, constant bilateral chest pain with associated back pain and SOB that is worsened by exertion onset 1 hours ago. Patient describes the chest pain as a heavy pressure and states it is worsened by movement and touch. He denies past episodes of similar symptoms. Patient reports past medical history of emphysema. Patient denies past medical history of MI or other cardiovascular conditions.   Past Medical History  Diagnosis Date  . HIV (human immunodeficiency virus infection) (HCC)   . Hepatitis B   . Incarceration   . Emphysema/COPD [facility](HCC)    Past Surgical History  Procedure Laterality Date  . I&d extremity  05/21/2012    Procedure: IRRIGATION AND DEBRIDEMENT EXTREMITY;  Surgeon: Javier DockerJeffrey C Beane, MD;  Location: [facility] OR;  Service: Orthopedics;  Laterality: Right;   History reviewed. No pertinent family history. Social History  Substance Use Topics  . Smoking status: Current Every Day Smoker -- 0.25 packs/day    Types: Cigarettes  . Smokeless tobacco: Never Used  . Alcohol Use: Yes     Comment: Weekends.     Review of Systems  Respiratory: Positive for shortness of breath.   Cardiovascular: Positive for chest pain.  Musculoskeletal: Positive for back pain.  All other systems reviewed and are negative.     Allergies  Review of patient's allergies indicates no  known allergies.  Home Medications   Prior to Admission medications   Medication Sig Start Date End Date Taking? Authorizing Provider  darunavir-cobicistat (PREZCOBIX) 800-150 MG per tablet Take 1 tablet by mouth daily. Patient taking differently: Take 1 tablet by mouth every morning.  02/13/15  Yes Randall Hissornelius N Van Dam, MD  emtricitabine-tenofovir (TRUVADA) 200-300 MG per tablet Take 1 tablet by mouth daily. Patient taking differently: Take 1 tablet by mouth every morning.  02/09/15 02/09/16 Yes Randall Hissornelius N Van Dam, MD  Gabapentin, PHN, 300 MG TABS Take 300 mg by mouth at bedtime as needed. Patient taking differently: Take 300 mg by mouth daily.  06/30/13  Yes Randall Hissornelius N Van Dam, MD  methocarbamol (ROBAXIN) 500 MG tablet Take 2 tablets (1,000 mg total) by mouth every 8 (eight) hours as needed for muscle spasms. Patient not taking: Reported on 05/29/2015 05/25/15   Loren Raceravid Yelverton, MD  omeprazole (PRILOSEC) 20 MG capsule Take 1 capsule (20 mg total) by mouth daily. Patient not taking: Reported on 05/29/2015 05/24/15   Shon Batonourtney F Horton, MD  predniSONE (DELTASONE) 10 MG tablet Take 6 tablets (60 mg total) by mouth daily. Patient not taking: Reported on 09/27/2015 05/29/15   Azalia BilisKevin Campos, MD  traMADol (ULTRAM) 50 MG tablet Take 1 tablet (50 mg total) by mouth every 6 (six) hours as needed. Patient not taking: Reported on 05/29/2015 05/24/15   Shon Batonourtney F Horton, MD   BP 131/90 mmHg  Pulse 90  Temp(Src) 98.1 F (36.7 C) (Oral)  Resp 18  Ht  (1.778 m)  Wt 155 lb (70.308 kg)  BMI 22.24 kg/m2  SpO2 100% Physical Exam  Constitutional: He is oriented to person, place, and time. He appears well-developed and well-nourished. No distress.  Uncomfortable appearing   HENT:  Head: Normocephalic and atraumatic.  Right Ear: Hearing normal.  Left Ear: Hearing normal.  Nose: Nose normal.  Mouth/Throat: Oropharynx is clear and moist and mucous membranes are normal.  Eyes: Conjunctivae and EOM are normal.  Pupils are equal, round, and reactive to light.  Neck: Normal range of motion. Neck supple.  Cardiovascular: Regular rhythm, S1 normal and S2 normal.  Exam reveals no gallop and no friction rub.   No murmur heard. Anterior chest and posterior rib tenderness that is worsened with movements of the torso  Pulmonary/Chest: Effort normal and breath sounds normal. No respiratory distress. He exhibits tenderness (diffuse anterior chest pain and posterior rib pain).  Abdominal: Soft. Normal appearance and bowel sounds are normal. There is no hepatosplenomegaly. There is no tenderness. There is no rebound, no guarding, no tenderness at McBurney's point and negative Murphy's sign. No hernia.  Musculoskeletal: Normal range of motion.  Neurological: He is alert and oriented to person, place, and time. He has normal strength. No cranial nerve deficit or sensory deficit. Coordination normal. GCS eye subscore is 4. GCS verbal subscore is 5. GCS motor subscore is 6.  Skin: Skin is warm, dry and intact. No rash noted. No cyanosis.  Psychiatric: He has a normal mood and affect. His speech is normal and behavior is normal. Thought content normal.  Nursing note and vitals reviewed.   ED Course  Procedures (including critical care time)  DIAGNOSTIC STUDIES: Oxygen Saturation is 98% on RA, normal by my interpretation.    COORDINATION OF CARE: 2:29 AM Will order EKG, CXR, and labs. Discussed treatment plan with pt at bedside and pt agreed to plan.   Labs Review Labs Reviewed  CBC WITH DIFFERENTIAL/PLATELET - Abnormal; Notable for the following:    RBC 3.88 (*)    HCT 38.6 (*)    MCH 34.3 (*)    All other components within normal limits  COMPREHENSIVE METABOLIC PANEL - Abnormal; Notable for the following:    Creatinine, Ser 1.25 (*)    Anion gap 4 (*)    All other components within normal limits  BRAIN NATRIURETIC PEPTIDE  TROPONIN I  D-DIMER, QUANTITATIVE (NOT AT [facility])    Imaging Review Dg Chest 2  View  09/27/2015  CLINICAL DATA:  Initial valuation for acute chest pain, syncope. EXAM: CHEST  2 VIEW COMPARISON:  Prior study from 05/29/2015. FINDINGS: The cardiac and mediastinal silhouettes are stable in size and contour, and remain within normal limits. The lungs are normally inflated. Emphysematous changes noted with prominent bulla, similar to previous. No airspace consolidation, pleural effusion, or pulmonary edema is identified. There is no pneumothorax. No acute osseous abnormality identified. IMPRESSION: Emphysematous changes, similar to prior. No other active cardiopulmonary process. Electronically Signed   By: Rise Mu M.D.   On: 09/27/2015 03:11   I have personally reviewed and evaluated these images and lab results as part of my medical decision-making.   EKG Interpretation   Date/Time:  Wednesday September 27 2015 02:27:23 EST Ventricular Rate:  88 PR Interval:  138 QRS Duration: 89 QT Interval:  380 QTC Calculation: 460 R Axis:   100 Text Interpretation:  Sinus rhythm Right axis deviation No significant  change since last tracing Confirmed by Annalisa Colonna  MD,  Wylma Tatem [id number]) on  09/27/2015 4:15:50 AM      MDM   Final diagnoses:  None   chest wall pain  Presents to the ER with complaints of pleuritic chest pain, back pain, syncope. Patient does not have any known coronary artery disease. Cardiac workup was unremarkable including EKG and troponin. Patient's pain is very reproducible. He has significant tenderness in the anterior costal margins as well as across his posterior ribs. Pain is also reproducible with movement of his torso. This is most consistent with musculoskeletal pain. He d-dimer was performed and was negative. PERC/Wells negative for PE.  I personally performed the services described in this documentation, which was scribed in my presence. The recorded information has been reviewed and is accurate.     Gilda Crease, MD 09/27/15  0530

## 2015-09-27 NOTE — ED Notes (Signed)
Provided a blanket, apple sauce, sprite with ice, and [place]key sandwich provided. Pt is calm, cooperative.

## 2015-09-27 NOTE — Discharge Instructions (Signed)

## 2015-09-27 NOTE — ED Provider Notes (Signed)
CSN: [id number]     Arrival date & time 09/27/15  1910 History   First MD Initiated Contact with Patient 09/27/15 1955     Chief Complaint  Patient presents with  . Chest Pain     (Consider location/radiation/quality/duration/timing/severity/associated sxs/prior Treatment) Patient is a 49 y.o. male presenting with chest pain. The history is provided by the patient.  Chest Pain Chest pain location: diffuse all over chest with breathing and movement. Pain quality: aching   Pain radiates to:  Does not radiate Pain severity:  Moderate Onset quality:  Gradual Timing:  Constant Progression:  Unchanged Chronicity:  New Context: breathing and movement   Relieved by:  Nothing Worsened by:  Deep breathing Ineffective treatments:  None tried Associated symptoms: no fever, no shortness of breath and not vomiting     Past Medical History  Diagnosis Date  . HIV (human immunodeficiency virus infection) (HCC)   . Hepatitis B   . Incarceration   . Emphysema/COPD [facility](HCC)    Past Surgical History  Procedure Laterality Date  . I&d extremity  05/21/2012    Procedure: IRRIGATION AND DEBRIDEMENT EXTREMITY;  Surgeon: Javier DockerJeffrey C Beane, MD;  Location: [facility] OR;  Service: Orthopedics;  Laterality: Right;   History reviewed. No pertinent family history. Social History  Substance Use Topics  . Smoking status: Current Every Day Smoker -- 0.25 packs/day    Types: Cigarettes  . Smokeless tobacco: Never Used  . Alcohol Use: Yes     Comment: Weekends.     Review of Systems  Constitutional: Negative for fever.  Respiratory: Negative for shortness of breath.   Cardiovascular: Positive for chest pain.  Gastrointestinal: Negative for vomiting.  All other systems reviewed and are negative.     Allergies  Review of patient's allergies indicates no known allergies.  Home Medications   Prior to Admission medications   Medication Sig Start Date End Date Taking? Authorizing Provider   darunavir-cobicistat (PREZCOBIX) 800-150 MG per tablet Take 1 tablet by mouth daily. Patient taking differently: Take 1 tablet by mouth every morning.  02/13/15  Yes Randall Hissornelius N Van Dam, MD  emtricitabine-tenofovir (TRUVADA) 200-300 MG per tablet Take 1 tablet by mouth daily. Patient taking differently: Take 1 tablet by mouth every morning.  02/09/15 02/09/16 Yes Randall Hissornelius N Van Dam, MD  Gabapentin, PHN, 300 MG TABS Take 300 mg by mouth at bedtime as needed. Patient not taking: Reported on 09/27/2015 06/30/13   Randall Hissornelius N Van Dam, MD  traMADol (ULTRAM) 50 MG tablet Take 1 tablet (50 mg total) by mouth every 6 (six) hours as needed. Patient not taking: Reported on 09/27/2015 09/27/15   Gilda Creasehristopher J Pollina, MD   SpO2 100% Physical Exam  Constitutional: He is oriented to person, place, and time. He appears well-developed and well-nourished. No distress.  HENT:  Head: Normocephalic and atraumatic.  Eyes: Conjunctivae are normal.  Neck: Neck supple. No tracheal deviation present.  Cardiovascular: Normal rate, regular rhythm and normal heart sounds.   Pulmonary/Chest: Effort normal and breath sounds normal. No respiratory distress. He has no wheezes. He has no rales. He exhibits tenderness (diffuse bilateral and sternal to light pressure).  Abdominal: Soft. He exhibits no distension.  Neurological: He is alert and oriented to person, place, and time.  Skin: Skin is warm and dry.  Psychiatric: He has a normal mood and affect.    ED Course  Procedures (including critical care time) Labs Review Labs Reviewed - No data to display  Imaging Review Dg Chest 2  View  09/27/2015  CLINICAL DATA:  Initial valuation for acute chest pain, syncope. EXAM: CHEST  2 VIEW COMPARISON:  Prior study from 05/29/2015. FINDINGS: The cardiac and mediastinal silhouettes are stable in size and contour, and remain within normal limits. The lungs are normally inflated. Emphysematous changes noted with prominent bulla,  similar to previous. No airspace consolidation, pleural effusion, or pulmonary edema is identified. There is no pneumothorax. No acute osseous abnormality identified. IMPRESSION: Emphysematous changes, similar to prior. No other active cardiopulmonary process. Electronically Signed   By: Rise Mu M.D.   On: 09/27/2015 03:11   I have personally reviewed and evaluated these images and lab results as part of my medical decision-making.   EKG Interpretation   Date/Time:  Wednesday September 27 2015 19:23:01 EST Ventricular Rate:  66 PR Interval:  146 QRS Duration: 88 QT Interval:  418 QTC Calculation: 438 R Axis:   77 Text Interpretation:  Sinus rhythm No significant change since last  tracing Confirmed by Mikaelah Trostle MD, Yanelly Cantrelle ([id number]) on 09/27/2015 7:55:56 PM      MDM   Final diagnoses:  Chest wall pain    49 y.o. male presents with ongoing chest wall pain diffusely. Had workup earlier today including troponin, d-dimer, low risk for cardiac etiologies and PE. CXR unremarkable. No EKG changes on this visit. Has not filled tramadol prescription for reproducible diffuse chest wall pain. Do not feel further emergent workup is indicated given extensive workup earlier and noncompliance with therapy. Given ibuprofen for short course as needed if tramadol not sufficient. Plan to follow up with PCP as needed and return precautions discussed for worsening or new concerning symptoms.     Lyndal Pulley, MD 09/28/15 [phone]

## 2015-09-27 NOTE — ED Notes (Signed)
Bed: [id number] Expected date:  Expected time:  Means of arrival:  Comments: Anxiety

## 2015-09-27 NOTE — ED Notes (Signed)
Patient called reporting he wants pain medication. Informed provider. No new orders. Pt has no signs of acute distress. Vital signs stable. Informed pt of no new orders of pain medication. Pt started cussing and getting agitated with staff. Asked if there was anything else that could be done, he didn't provide an answer to help with his pain.

## 2015-09-27 NOTE — ED Notes (Signed)
Dr. Pollina at bedside   

## 2015-09-27 NOTE — ED Notes (Signed)
Pt arrives to us from home via [facility] c/o chest wall pain. Pt states that he was at the hospital earlier today and felt fine when discharged but he believes the morphine they gave him has worn off. Pt had discharge papers with written prescription upon arrival.

## 2015-09-27 NOTE — ED Notes (Addendum)
[facility]. Pt is complaining of chest pain and had a syncopal episode at home. Per EMS, pt came into home, complaining of chest pain and difficulty breathing to house mate. Then, he had a witnessed syncopal episode. Denies hitting head. Pt came into on the stretcher in a fetal position with eyes closed and in no acute respiratory distress.

## 2015-09-27 NOTE — ED Notes (Addendum)
Writer walked by pt's room and heard pt cursing on the phone.  Writer asked pt what was wrong and pt states "he's fucking hurting and wants fucking pain meds.  Pt also stated he was cold and wanted to use the bathroom."  Writer provided a urinal and a blanket for him.  Pt continued to curse about wanting pain meds.  Writer stated that she could not do that for him but will inform the nurse.  Pt continued to curse at Clinical research associatewriter.  Pt stated that Clinical research associatewriter was Ambulance personstanding sarcastically, Clinical research associatewriter informed pt that she wasn't sure how she was standing sarcastically. Pt continued to curse at Clinical research associatewriter while Clinical research associatewriter asked pt to stop.

## 2015-09-27 NOTE — ED Notes (Signed)
Patient apgolized  for his behavior earlier.

## 2016-02-22 ENCOUNTER — Encounter (HOSPITAL_COMMUNITY): Payer: Self-pay | Admitting: Emergency Medicine

## 2016-02-22 ENCOUNTER — Emergency Department (HOSPITAL_COMMUNITY)
Admission: EM | Admit: 2016-02-22 | Discharge: 2016-02-22 | Disposition: A | Discharge status: Home / Self Care | Payer: Self-pay | Attending: Emergency Medicine | Admitting: Emergency Medicine

## 2016-02-22 DIAGNOSIS — Y9389 Activity, other specified: Secondary | ICD-10-CM | POA: Insufficient documentation

## 2016-02-22 DIAGNOSIS — S66126A Laceration of flexor muscle, fascia and tendon of right little finger at wrist and hand level, initial encounter: Secondary | ICD-10-CM | POA: Insufficient documentation

## 2016-02-22 DIAGNOSIS — Z79899 Other long term (current) drug therapy: Secondary | ICD-10-CM | POA: Insufficient documentation

## 2016-02-22 DIAGNOSIS — Y9289 Other specified places as the place of occurrence of the external cause: Secondary | ICD-10-CM | POA: Insufficient documentation

## 2016-02-22 DIAGNOSIS — J439 Emphysema, unspecified: Secondary | ICD-10-CM | POA: Insufficient documentation

## 2016-02-22 DIAGNOSIS — Y998 Other external cause status: Secondary | ICD-10-CM | POA: Insufficient documentation

## 2016-02-22 DIAGNOSIS — IMO0002 Reserved for concepts with insufficient information to code with codable children: Secondary | ICD-10-CM

## 2016-02-22 DIAGNOSIS — B20 Human immunodeficiency virus [HIV] disease: Secondary | ICD-10-CM | POA: Insufficient documentation

## 2016-02-22 DIAGNOSIS — S66326A Laceration of extensor muscle, fascia and tendon of right little finger at wrist and hand level, initial encounter: Secondary | ICD-10-CM | POA: Insufficient documentation

## 2016-02-22 DIAGNOSIS — F17210 Nicotine dependence, cigarettes, uncomplicated: Secondary | ICD-10-CM | POA: Insufficient documentation

## 2016-02-22 DIAGNOSIS — W268XXA Contact with other sharp object(s), not elsewhere classified, initial encounter: Secondary | ICD-10-CM | POA: Insufficient documentation

## 2016-02-22 MED ORDER — SULFAMETHOXAZOLE-TRIMETHOPRIM 800-160 MG PO TABS: 1.0000 | ORAL_TABLET | Freq: Two times a day (BID) | ORAL | Status: AC

## 2016-02-22 NOTE — Discharge Instructions (Signed)
Mr. Jacqualine MauLeonard R Sipe,  Nice meeting you! Please follow-up with Dr. Janee Mornhompson. Their office should call you tomorrow but if you do not hear from them tomorrow, please call them. Return to the emergency department if you develop redness or red streaks, yellow/green drainage, increased swelling. Feel better soon!  S. Lane HackerNicole Tanaysha Alkins, PA-C

## 2016-02-22 NOTE — ED Notes (Signed)
Pt st's he cut his right 5th finger two days ago on a sharpe object.  Pt has lac between the 5th PIP and MP joints.  Pt st's he can not bend his finger

## 2016-02-22 NOTE — Consult Note (Signed)
ORTHOPAEDIC CONSULTATION HISTORY & PHYSICAL REQUESTING PHYSICIAN: Scott Goldston, MD  Chief Complaint: R SF injury  HPI: Curtis Hodge is a 50 y.o. male who lacerated his R SF on the volar surface a couple of days ago.  He cannot flex the digit.  He is HIV+, Hep C +, and works as a barber.  Past Medical History  Diagnosis Date  . HIV (human immunodeficiency virus infection) (HCC)   . Hepatitis B   . Incarceration   . Emphysema/COPD (HCC)    Past Surgical History  Procedure Laterality Date  . I&d extremity  05/21/2012    Procedure: IRRIGATION AND DEBRIDEMENT EXTREMITY;  Surgeon: Jeffrey C Beane, MD;  Location: [facility] OR;  Service: Orthopedics;  Laterality: Right;   Social History   Social History  . Marital Status: Single    Spouse Name: N/A  . Number of Children: N/A  . Years of Education: N/A   Social History Main Topics  . Smoking status: Current Every Day Smoker -- 0.25 packs/day    Types: Cigarettes  . Smokeless tobacco: Never Used  . Alcohol Use: Yes     Comment: Weekends.   . Drug Use: 5.00 per week    Special: Marijuana     Comment: 2-3 times a week.  Last used: 1 Week ago.   . Sexual Activity: Not Asked     Comment: given condoms   Other Topics Concern  . None   Social History Narrative   No family history on file. No Known Allergies Prior to Admission medications   Medication Sig Start Date End Date Taking? Authorizing Provider  darunavir-cobicistat (PREZCOBIX) 800-150 MG per tablet Take 1 tablet by mouth daily. Patient taking differently: Take 1 tablet by mouth every morning.  02/13/15   Cornelius N Van Dam, MD  emtricitabine-tenofovir (TRUVADA) 200-300 MG per tablet Take 1 tablet by mouth daily. Patient taking differently: Take 1 tablet by mouth every morning.  02/09/15 02/09/16  Cornelius N Van Dam, MD  Gabapentin, PHN, 300 MG TABS Take 300 mg by mouth at bedtime as needed. Patient not taking: Reported on 09/27/2015 06/30/13   Cornelius N Van Dam, MD    ibuprofen (ADVIL,MOTRIN) 600 MG tablet Take 1 tablet (600 mg total) by mouth every 6 (six) hours as needed. 09/27/15   Daniel Knott, MD  sulfamethoxazole-trimethoprim (BACTRIM DS,SEPTRA DS) 800-160 MG tablet Take 1 tablet by mouth 2 (two) times daily. 02/22/16 02/29/16  Samantha Nicole Riley, PA-C  traMADol (ULTRAM) 50 MG tablet Take 1 tablet (50 mg total) by mouth every 6 (six) hours as needed. Patient not taking: Reported on 09/27/2015 09/27/15   Christopher J Pollina, MD   No results found.  Positive ROS: All other systems have been reviewed and were otherwise negative with the exception of those mentioned in the HPI and as above.  Physical Exam: Vitals: Refer to EMR. Constitutional:  WD, WN, NAD HEENT:  NCAT, EOMI Neuro/Psych:  Alert & oriented to person, place, and time; appropriate mood & affect Lymphatic: No generalized extremity edema or lymphadenopathy Extremities / MSK:  The extremities are normal with respect to appearance, ranges of motion, joint stability, muscle strength/tone, sensation, & perfusion except as otherwise noted:  R SF with small transverse lac over P1.  It lies completely extended.  No active flexion.  No flexion with passive FA muscle squeeze.  Intact sharp/dull on R and U sides of digit tip  Assessment: R SF laceration with laceration of FDP and FDS  Plan: I    D/w patient the diagnosis, options including observation, surgical repair, and amputation, including the postop expectations, need for rehab, etc.  He would like to proceed with repair.  Will schedule for next week.  He is being d/c today on oral antibiotics.    Bryttani Blew A. Okie Bogacz, MD      Orthopaedic & Hand Surgery [facility] [address] [place], [place]  [address] Office: [phone] Mobile: [phone]  02/22/2016, 7:48 PM   

## 2016-02-22 NOTE — ED Provider Notes (Signed)
CSN: [id number]     Arrival date & time 02/22/16  1537 History  By signing my name below, I, Phillis Haggis, attest that this documentation has been prepared under the direction and in the presence of Melton Krebs, PA-C. Electronically Signed: Phillis Haggis, ED Scribe. 02/22/2016. 4:14 PM.   Chief Complaint  Patient presents with  . Laceration   The history is provided by the patient. No language interpreter was used.  HPI Comments: Curtis Hodge is a 50 y.o. Male with a hx of HIV, Hepatitis B, and emphysema/COPD who presents to the Emergency Department complaining of a laceration to the right fifth finger onset two days ago. Pt states that he was helping people clean when he cut himself on a sharp metal object. He states that he ran his finger under cold water for a while then put rubbing alcohol on the area. He reports that he is unable to bend the finger at the PIP and MCP joints. He denies numbness or weakness. Pt states that he is UTD on tdap.   Past Medical History  Diagnosis Date  . HIV (human immunodeficiency virus infection) (HCC)   . Hepatitis B   . Incarceration   . Emphysema/COPD [facility])    Past Surgical History  Procedure Laterality Date  . I&d extremity  05/21/2012    Procedure: IRRIGATION AND DEBRIDEMENT EXTREMITY;  Surgeon: [name], MD;  Location: [facility] OR;  Service: Orthopedics;  Laterality: Right;   No family history on file. Social History  Substance Use Topics  . Smoking status: Current Every Day Smoker -- 0.25 packs/day    Types: Cigarettes  . Smokeless tobacco: Never Used  . Alcohol Use: Yes     Comment: Weekends.     Review of Systems  Skin: Positive for wound.  Neurological: Positive for weakness and numbness.  All other systems reviewed and are negative.   Allergies  Review of patient's allergies indicates no known allergies.  Home Medications   Prior to Admission medications   Medication Sig Start Date End Date Taking? Authorizing  Provider  darunavir-cobicistat (PREZCOBIX) 800-150 MG per tablet Take 1 tablet by mouth daily. Patient taking differently: Take 1 tablet by mouth every morning.  02/13/15   Randall Hiss, MD  emtricitabine-tenofovir (TRUVADA) 200-300 MG per tablet Take 1 tablet by mouth daily. Patient taking differently: Take 1 tablet by mouth every morning.  02/09/15 02/09/16  Randall Hiss, MD  Gabapentin, PHN, 300 MG TABS Take 300 mg by mouth at bedtime as needed. Patient not taking: Reported on 09/27/2015 06/30/13   Randall Hiss, MD  ibuprofen (ADVIL,MOTRIN) 600 MG tablet Take 1 tablet (600 mg total) by mouth every 6 (six) hours as needed. 09/27/15   Lyndal Pulley, MD  traMADol (ULTRAM) 50 MG tablet Take 1 tablet (50 mg total) by mouth every 6 (six) hours as needed. Patient not taking: Reported on 09/27/2015 09/27/15   Gilda Crease, MD   BP 135/87 mmHg  Pulse 97  Temp(Src) 99 F (37.2 C) (Oral)  Resp 18  Ht 5' 9.5" (1.765 m)  Wt 155 lb (70.308 kg)  BMI 22.57 kg/m2  SpO2 98% Physical Exam  Constitutional: He is oriented to person, place, and time. He appears well-developed and well-nourished. No distress.  HENT:  Head: Normocephalic and atraumatic.  Mouth/Throat: Oropharynx is clear and moist. No oropharyngeal exudate.  Eyes: Conjunctivae and EOM are normal. Pupils are equal, round, and reactive to light.  Neck: Normal  range of motion. Neck supple.  Musculoskeletal:  1.5 cm superficial laceration between the right fifth finger PIP and MCP joints. Patient able to actively flex and extend at MCP but not at DIP or PIP joints. Full ROM with passive ROM. Mildly edematous without erythema, drainage.   Neurological: He is alert and oriented to person, place, and time.  Skin: Skin is warm and dry.  Psychiatric: He has a normal mood and affect. His behavior is normal.    ED Course  Procedures  DIAGNOSTIC STUDIES: Oxygen Saturation is 98% on RA, normal by my interpretation.     COORDINATION OF CARE: 4:14 PM-Discussed treatment plan which includes antibiotics with pt at bedside and pt agreed to plan.   MDM   Final diagnoses:  Laceration  Tendon injury   Less likely infectious etiology; however, given patient comorbidities, will d/c with bactrim.  Dr. Janee Mornhompson, hand surgeon, evaluated patient and advised OP follow-up, as patient most likely injured flexor and extensor tendons.  Patient may be safely discharged home. Discussed reasons for return. Patient to follow-up with Dr. Janee Mornhompson. Patient in understanding and agreement with the plan. I personally performed the services described in this documentation, which was scribed in my presence. The recorded information has been reviewed and is accurate.   Melton KrebsSamantha Nicole Hendricks Schwandt, PA-C 02/26/16 [id number]  Pricilla LovelessScott Goldston, MD 03/05/16 1000

## 2016-02-22 NOTE — ED Notes (Addendum)
[facility] seen at [facility] and has small lac noted to Rt 5th digit.  Pt unable bend finger at 1st and 2nd joint.  Is abe to move at hand joint.

## 2016-02-23 ENCOUNTER — Encounter (HOSPITAL_BASED_OUTPATIENT_CLINIC_OR_DEPARTMENT_OTHER): Payer: Self-pay | Admitting: *Deleted

## 2016-02-23 ENCOUNTER — Other Ambulatory Visit: Payer: Self-pay | Admitting: Orthopedic Surgery

## 2016-02-27 ENCOUNTER — Encounter (HOSPITAL_BASED_OUTPATIENT_CLINIC_OR_DEPARTMENT_OTHER)
Admission: RE | Disposition: A | Discharge status: Home / Self Care | Payer: Self-pay | Source: Ambulatory Visit | Attending: Orthopedic Surgery

## 2016-02-27 ENCOUNTER — Ambulatory Visit (HOSPITAL_BASED_OUTPATIENT_CLINIC_OR_DEPARTMENT_OTHER)
Admission: RE | Admit: 2016-02-27 | Discharge: 2016-02-27 | Disposition: A | Discharge status: Home / Self Care | Payer: Self-pay | Source: Ambulatory Visit | Attending: Orthopedic Surgery | Admitting: Orthopedic Surgery

## 2016-02-27 ENCOUNTER — Ambulatory Visit (HOSPITAL_BASED_OUTPATIENT_CLINIC_OR_DEPARTMENT_OTHER): Payer: Self-pay | Admitting: Anesthesiology

## 2016-02-27 ENCOUNTER — Encounter (HOSPITAL_BASED_OUTPATIENT_CLINIC_OR_DEPARTMENT_OTHER): Payer: Self-pay

## 2016-02-27 DIAGNOSIS — F17210 Nicotine dependence, cigarettes, uncomplicated: Secondary | ICD-10-CM | POA: Insufficient documentation

## 2016-02-27 DIAGNOSIS — X58XXXA Exposure to other specified factors, initial encounter: Secondary | ICD-10-CM | POA: Insufficient documentation

## 2016-02-27 DIAGNOSIS — B1920 Unspecified viral hepatitis C without hepatic coma: Secondary | ICD-10-CM | POA: Insufficient documentation

## 2016-02-27 DIAGNOSIS — J439 Emphysema, unspecified: Secondary | ICD-10-CM | POA: Insufficient documentation

## 2016-02-27 DIAGNOSIS — S66126A Laceration of flexor muscle, fascia and tendon of right little finger at wrist and hand level, initial encounter: Secondary | ICD-10-CM | POA: Insufficient documentation

## 2016-02-27 DIAGNOSIS — Z21 Asymptomatic human immunodeficiency virus [HIV] infection status: Secondary | ICD-10-CM | POA: Insufficient documentation

## 2016-02-27 HISTORY — PX: FLEXOR TENDON REPAIR: SHX6501

## 2016-02-27 SURGERY — REPAIR, TENDON, FLEXOR
Anesthesia: General | Site: Finger | Laterality: Right

## 2016-02-27 MED ORDER — LACTATED RINGERS IV SOLN: INTRAVENOUS | Status: DC

## 2016-02-27 MED ORDER — FENTANYL CITRATE (PF) 100 MCG/2ML IJ SOLN
INTRAMUSCULAR | Status: AC
  Filled 2016-02-27: qty 2

## 2016-02-27 MED ORDER — ONDANSETRON HCL 4 MG/2ML IJ SOLN
INTRAMUSCULAR | Status: DC | PRN
  Administered 2016-02-27: 4 mg via INTRAVENOUS

## 2016-02-27 MED ORDER — PHENYLEPHRINE HCL 10 MG/ML IJ SOLN
INTRAMUSCULAR | Status: DC | PRN
  Administered 2016-02-27 (×2): 80 ug via INTRAVENOUS

## 2016-02-27 MED ORDER — FENTANYL CITRATE (PF) 100 MCG/2ML IJ SOLN
50.0000 ug | INTRAMUSCULAR | Status: DC | PRN
  Administered 2016-02-27: 100 ug via INTRAVENOUS
  Administered 2016-02-27: 50 ug via INTRAVENOUS

## 2016-02-27 MED ORDER — OXYCODONE-ACETAMINOPHEN 5-325 MG PO TABS: 1.0000 | ORAL_TABLET | Freq: Four times a day (QID) | ORAL | Status: DC | PRN

## 2016-02-27 MED ORDER — LIDOCAINE HCL (CARDIAC) 20 MG/ML IV SOLN
INTRAVENOUS | Status: DC | PRN
  Administered 2016-02-27: 50 mg via INTRAVENOUS

## 2016-02-27 MED ORDER — DEXAMETHASONE SODIUM PHOSPHATE 10 MG/ML IJ SOLN
INTRAMUSCULAR | Status: DC | PRN
  Administered 2016-02-27: 10 mg via INTRAVENOUS

## 2016-02-27 MED ORDER — LIDOCAINE HCL (CARDIAC) 20 MG/ML IV SOLN
INTRAVENOUS | Status: AC
  Filled 2016-02-27: qty 5

## 2016-02-27 MED ORDER — PROPOFOL 10 MG/ML IV BOLUS
INTRAVENOUS | Status: AC
  Filled 2016-02-27: qty 20

## 2016-02-27 MED ORDER — GLYCOPYRROLATE 0.2 MG/ML IJ SOLN: 0.2000 mg | Freq: Once | INTRAMUSCULAR | Status: DC | PRN

## 2016-02-27 MED ORDER — DEXAMETHASONE SODIUM PHOSPHATE 10 MG/ML IJ SOLN
INTRAMUSCULAR | Status: AC
  Filled 2016-02-27: qty 1

## 2016-02-27 MED ORDER — MIDAZOLAM HCL 2 MG/2ML IJ SOLN
INTRAMUSCULAR | Status: AC
  Filled 2016-02-27: qty 2

## 2016-02-27 MED ORDER — MIDAZOLAM HCL 2 MG/2ML IJ SOLN
1.0000 mg | INTRAMUSCULAR | Status: DC | PRN
  Administered 2016-02-27: 2 mg via INTRAVENOUS

## 2016-02-27 MED ORDER — OXYCODONE HCL 5 MG PO TABS
5.0000 mg | ORAL_TABLET | Freq: Once | ORAL | Status: AC
  Administered 2016-02-27: 5 mg via ORAL

## 2016-02-27 MED ORDER — PROPOFOL 10 MG/ML IV BOLUS
INTRAVENOUS | Status: DC | PRN
  Administered 2016-02-27: 250 mg via INTRAVENOUS
  Administered 2016-02-27: 50 mg via INTRAVENOUS

## 2016-02-27 MED ORDER — BUPIVACAINE-EPINEPHRINE (PF) 0.5% -1:200000 IJ SOLN
INTRAMUSCULAR | Status: AC
  Filled 2016-02-27: qty 30

## 2016-02-27 MED ORDER — PROMETHAZINE HCL 25 MG/ML IJ SOLN: 6.2500 mg | INTRAMUSCULAR | Status: DC | PRN

## 2016-02-27 MED ORDER — BUPIVACAINE HCL (PF) 0.5 % IJ SOLN
INTRAMUSCULAR | Status: AC
  Filled 2016-02-27: qty 30

## 2016-02-27 MED ORDER — PHENYLEPHRINE 40 MCG/ML (10ML) SYRINGE FOR IV PUSH (FOR BLOOD PRESSURE SUPPORT)
PREFILLED_SYRINGE | INTRAVENOUS | Status: AC
  Filled 2016-02-27: qty 10

## 2016-02-27 MED ORDER — SCOPOLAMINE 1 MG/3DAYS TD PT72: 1.0000 | MEDICATED_PATCH | Freq: Once | TRANSDERMAL | Status: DC | PRN

## 2016-02-27 MED ORDER — CEFAZOLIN SODIUM-DEXTROSE 2-4 GM/100ML-% IV SOLN
2.0000 g | INTRAVENOUS | Status: AC
  Administered 2016-02-27: 2 g via INTRAVENOUS

## 2016-02-27 MED ORDER — BUPIVACAINE-EPINEPHRINE 0.5% -1:200000 IJ SOLN
INTRAMUSCULAR | Status: DC | PRN
  Administered 2016-02-27: 10 mL

## 2016-02-27 MED ORDER — 0.9 % SODIUM CHLORIDE (POUR BTL) OPTIME
TOPICAL | Status: DC | PRN
Start: 2016-02-27 — End: 2016-02-27
  Administered 2016-02-27: 300 mL

## 2016-02-27 MED ORDER — LIDOCAINE HCL (PF) 1 % IJ SOLN
INTRAMUSCULAR | Status: AC
  Filled 2016-02-27: qty 30

## 2016-02-27 MED ORDER — LACTATED RINGERS IV SOLN
INTRAVENOUS | Status: DC
  Administered 2016-02-27 (×2): via INTRAVENOUS

## 2016-02-27 MED ORDER — OXYCODONE HCL 5 MG PO TABS
ORAL_TABLET | ORAL | Status: AC
  Filled 2016-02-27: qty 1

## 2016-02-27 MED ORDER — ONDANSETRON HCL 4 MG/2ML IJ SOLN
INTRAMUSCULAR | Status: AC
  Filled 2016-02-27: qty 2

## 2016-02-27 MED ORDER — CEFAZOLIN SODIUM-DEXTROSE 2-4 GM/100ML-% IV SOLN
INTRAVENOUS | Status: AC
  Filled 2016-02-27: qty 100

## 2016-02-27 MED ORDER — HYDROMORPHONE HCL 1 MG/ML IJ SOLN: 0.2500 mg | INTRAMUSCULAR | Status: DC | PRN

## 2016-02-27 SURGICAL SUPPLY — 48 items
BLADE SURG 15 STRL LF DISP TIS (BLADE) ×1 IMPLANT
BLADE SURG 15 STRL SS (BLADE) ×3
BNDG CMPR 9X4 STRL LF SNTH (GAUZE/BANDAGES/DRESSINGS) ×1
BNDG COHESIVE 4X5 TAN STRL (GAUZE/BANDAGES/DRESSINGS) ×3 IMPLANT
BNDG ESMARK 4X9 LF (GAUZE/BANDAGES/DRESSINGS) ×2 IMPLANT
BNDG GAUZE ELAST 4 BULKY (GAUZE/BANDAGES/DRESSINGS) ×6 IMPLANT
CHLORAPREP W/TINT 26ML (MISCELLANEOUS) ×3 IMPLANT
CORDS BIPOLAR (ELECTRODE) ×3 IMPLANT
COVER BACK TABLE 60X90IN (DRAPES) ×3 IMPLANT
COVER MAYO STAND STRL (DRAPES) ×3 IMPLANT
CUFF TOURNIQUET SINGLE 18IN (TOURNIQUET CUFF) ×2 IMPLANT
DRAPE EXTREMITY T 121X128X90 (DRAPE) ×3 IMPLANT
DRAPE SURG 17X23 STRL (DRAPES) ×3 IMPLANT
DRSG EMULSION OIL 3X3 NADH (GAUZE/BANDAGES/DRESSINGS) ×3 IMPLANT
ELECT REM PT RETURN 9FT ADLT (ELECTROSURGICAL) ×3
ELECTRODE REM PT RTRN 9FT ADLT (ELECTROSURGICAL) IMPLANT
GAUZE SPONGE 4X4 12PLY STRL (GAUZE/BANDAGES/DRESSINGS) ×3 IMPLANT
GLOVE BIO SURGEON STRL SZ7 (GLOVE) ×2 IMPLANT
GLOVE BIO SURGEON STRL SZ7.5 (GLOVE) ×3 IMPLANT
GLOVE BIOGEL PI IND STRL 7.0 (GLOVE) IMPLANT
GLOVE BIOGEL PI IND STRL 8 (GLOVE) ×1 IMPLANT
GLOVE BIOGEL PI INDICATOR 7.0 (GLOVE) ×2
GLOVE BIOGEL PI INDICATOR 8 (GLOVE) ×2
GLOVE ECLIPSE 6.5 STRL STRAW (GLOVE) ×2 IMPLANT
GOWN STRL REUS W/ TWL LRG LVL3 (GOWN DISPOSABLE) ×2 IMPLANT
GOWN STRL REUS W/TWL LRG LVL3 (GOWN DISPOSABLE) ×3
GOWN STRL REUS W/TWL XL LVL3 (GOWN DISPOSABLE) ×5 IMPLANT
NDL HYPO 25X1 1.5 SAFETY (NEEDLE) IMPLANT
NEEDLE HYPO 25X1 1.5 SAFETY (NEEDLE) ×3 IMPLANT
NS IRRIG 1000ML POUR BTL (IV SOLUTION) ×3 IMPLANT
PACK BASIN DAY SURGERY FS (CUSTOM PROCEDURE TRAY) ×3 IMPLANT
PADDING CAST ABS 4INX4YD NS (CAST SUPPLIES) ×2
PADDING CAST ABS COTTON 4X4 ST (CAST SUPPLIES) ×1 IMPLANT
RUBBERBAND STERILE (MISCELLANEOUS) ×3 IMPLANT
SLEEVE SCD COMPRESS KNEE MED (MISCELLANEOUS) ×2 IMPLANT
SLING ARM FOAM STRAP LRG (SOFTGOODS) ×2 IMPLANT
SPLINT PLASTER CAST XFAST 3X15 (CAST SUPPLIES) ×7 IMPLANT
SPLINT PLASTER XTRA FASTSET 3X (CAST SUPPLIES) ×16
STOCKINETTE 6  STRL (DRAPES) ×2
STOCKINETTE 6 STRL (DRAPES) ×1 IMPLANT
SUT PROLENE 6 0 P 1 18 (SUTURE) ×2 IMPLANT
SUT SUPRAMID 3-0 (SUTURE) ×8 IMPLANT
SUT VICRYL 3-0 CR8 SH (SUTURE) ×2 IMPLANT
SUT VICRYL RAPIDE 4/0 PS 2 (SUTURE) ×3 IMPLANT
SYR BULB 3OZ (MISCELLANEOUS) ×3 IMPLANT
SYRINGE 10CC LL (SYRINGE) ×2 IMPLANT
TOWEL OR 17X24 6PK STRL BLUE (TOWEL DISPOSABLE) ×3 IMPLANT
UNDERPAD 30X30 (UNDERPADS AND DIAPERS) ×3 IMPLANT

## 2016-02-27 NOTE — Progress Notes (Signed)
Removed oral airway

## 2016-02-27 NOTE — Interval H&P Note (Signed)
History and Physical Interval Note:  02/27/2016 12:34 PM  Curtis Hodge  has presented today for surgery, with the diagnosis of RIGHT SMALL FINGER FEXOR TENDON LACERATION  The various methods of treatment have been discussed with the patient and family. After consideration of risks, benefits and other options for treatment, the patient has consented to  Procedure(s): RIGHT SMALL FINGER FLEXOR TENDON REPAIR (Right) as a surgical intervention .  The patient's history has been reviewed, patient examined, no change in status, stable for surgery.  I have reviewed the patient's chart and labs.  Questions were answered to the patient's satisfaction.     Tatiana Courter A.

## 2016-02-27 NOTE — Anesthesia Postprocedure Evaluation (Signed)
Anesthesia Post Note  Patient: Curtis Hodge  Procedure(s) Performed: Procedure(s) (LRB): RIGHT SMALL FINGER FLEXOR TENDON REPAIR (Right)  Patient location during evaluation: PACU Anesthesia Type: General Level of consciousness: awake Pain management: pain level controlled Vital Signs Assessment: post-procedure vital signs reviewed and stable Cardiovascular status: stable Anesthetic complications: no    Last Vitals:  Filed Vitals:   02/27/16 1445 02/27/16 1458  BP: 142/98   Pulse: 70 88  Temp:  36.5 C  Resp: 25 20    Last Pain:  Filed Vitals:   02/27/16 1509  PainSc: 9                  EDWARDS,Keona Sheffler

## 2016-02-27 NOTE — Anesthesia Preprocedure Evaluation (Addendum)
Anesthesia Evaluation  Patient identified by MRN, date of birth, ID band Patient awake    Reviewed: Allergy & Precautions, NPO status , Patient's Chart, lab work & pertinent test results  Airway Mallampati: II  TM Distance: >3 FB Neck ROM: Full    Dental   Pulmonary COPD, Current Smoker,    breath sounds clear to auscultation       Cardiovascular negative cardio ROS   Rhythm:Regular Rate:Normal     Neuro/Psych    GI/Hepatic negative GI ROS, (+) Hepatitis -  Endo/Other  negative endocrine ROS  Renal/GU negative Renal ROS     Musculoskeletal   Abdominal   Peds  Hematology   Anesthesia Other Findings   Reproductive/Obstetrics                            Anesthesia Physical Anesthesia Plan  ASA: II  Anesthesia Plan: General   Post-op Pain Management:    Induction: Intravenous  Airway Management Planned: LMA  Additional Equipment:   Intra-op Plan:   Post-operative Plan: Extubation in OR  Informed Consent: I have reviewed the patients History and Physical, chart, labs and discussed the procedure including the risks, benefits and alternatives for the proposed anesthesia with the patient or authorized representative who has indicated his/her understanding and acceptance.   Dental advisory given  Plan Discussed with: CRNA and Anesthesiologist  Anesthesia Plan Comments:         Anesthesia Quick Evaluation

## 2016-02-27 NOTE — Anesthesia Procedure Notes (Signed)
Procedure Name: LMA Insertion Date/Time: 02/27/2016 12:42 PM Performed by: Caren MacadamARTER, Curtis Hodge Pre-anesthesia Checklist: Patient identified, Emergency Drugs available, Suction available and Patient being monitored Patient Re-evaluated:Patient Re-evaluated prior to inductionOxygen Delivery Method: Circle System Utilized Preoxygenation: Pre-oxygenation with 100% oxygen Intubation Type: IV induction Ventilation: Mask ventilation without difficulty LMA: LMA inserted LMA Size: 5.0 Number of attempts: 1 Airway Equipment and Method: Bite block Placement Confirmation: positive ETCO2 and breath sounds checked- equal and bilateral Tube secured with: Tape Dental Injury: Teeth and Oropharynx as per pre-operative assessment

## 2016-02-27 NOTE — Discharge Instructions (Signed)
Discharge Instructions ° ° °You have a dressing with a plaster splint incorporated in it. °Move your fingers as much as possible, making a full fist and fully opening the fist. °Elevate your hand to reduce pain & swelling of the digits.  Ice over the operative site may be helpful to reduce pain & swelling.  DO NOT USE HEAT. °Pain medicine has been prescribed for you.  °Use your medicine as needed over the first 48 hours, and then you can begin to taper your use.  You may use Tylenol in place of your prescribed pain medication, but not IN ADDITION to it. °Leave the dressing in place until you return to our office.  °You may shower, but keep the bandage clean & dry.  °You may drive a car when you are off of prescription pain medications and can safely control your vehicle with both hands. °Our office will call you to arrange follow-up ° ° °Please call [phone] during normal business hours or [phone] after hours for any problems. Including the following: ° °- excessive redness of the incisions °- drainage for more than 4 days °- fever of more than 101.5 F ° °*Please note that pain medications will not be refilled after hours or on weekends. ° °Post Anesthesia Home Care Instructions ° °Activity: °Get plenty of rest for the remainder of the day. A responsible adult should stay with you for 24 hours following the procedure.  °For the next 24 hours, DO NOT: °-Drive a car °-Operate machinery °-Drink alcoholic beverages °-Take any medication unless instructed by your physician °-Make any legal decisions or sign important papers. ° °Meals: °Start with liquid foods such as gelatin or soup. Progress to regular foods as tolerated. Avoid greasy, spicy, heavy foods. If nausea and/or vomiting occur, drink only clear liquids until the nausea and/or vomiting subsides. Call your physician if vomiting continues. ° °Special Instructions/Symptoms: °Your throat may feel dry or sore from the anesthesia or the breathing tube placed  in your throat during surgery. If this causes discomfort, gargle with warm salt water. The discomfort should disappear within 24 hours. ° °If you had a scopolamine patch placed behind your ear for the management of post- operative nausea and/or vomiting: ° °1. The medication in the patch is effective for 72 hours, after which it should be removed.  Wrap patch in a tissue and discard in the trash. Wash hands thoroughly with soap and water. °2. You may remove the patch earlier than 72 hours if you experience unpleasant side effects which may include dry mouth, dizziness or visual disturbances. °3. Avoid touching the patch. Wash your hands with soap and water after contact with the patch. °  ° °

## 2016-02-27 NOTE — Transfer of Care (Signed)
Immediate Anesthesia Transfer of Care Note  Patient: Curtis Hodge  Procedure(s) Performed: Procedure(s): RIGHT SMALL FINGER FLEXOR TENDON REPAIR (Right)  Patient Location: PACU  Anesthesia Type:General  Level of Consciousness: sedated  Airway & Oxygen Therapy: Patient Spontanous Breathing and Patient connected to face mask oxygen  Post-op Assessment: Report given to RN and Post -op Vital signs reviewed and stable  Post vital signs: Reviewed and stable  Last Vitals:  Filed Vitals:   02/27/16 1130  BP: 148/85  Pulse: 65  Temp: 36.5 C  Resp: 18    Complications: No apparent anesthesia complications

## 2016-02-27 NOTE — Op Note (Signed)
02/27/2016  2:11 PM  PATIENT:  Curtis Hodge  50 y.o. male  PRE-OPERATIVE DIAGNOSIS:  R SF laceration with FDP and FDS laceration  POST-OPERATIVE DIAGNOSIS:  Same--in zone 2   PROCEDURE:  Excisional debridement of skin and subcutaneous tissues from right small finger, with repair of both flexor tendons in zone 2, and simple repair of 1.5 cm laceration of the skin  SURGEON: Cliffton Asters. Janee Morn, MD  PHYSICIAN ASSISTANT: Danielle Rankin, OPA-C  ANESTHESIA:  general  SPECIMENS:  None  DRAINS:   None  EBL:  less than 50 mL  PREOPERATIVE INDICATIONS:  Curtis Hodge is a  50 y.o. male with a laceration of the right small finger at the PIP flexion crease.  Clinically, both neurovascular bundles were without injury, but both flexor tendons appeared lacerated.  Discussion was held him regarding the indications for exploration of this wound and repair of structures as indicated.  The risks benefits and alternatives were discussed with the patient preoperatively including but not limited to the risks of infection, bleeding, nerve injury, cardiopulmonary complications, the need for revision surgery, among others, and the patient verbalized understanding and consented to proceed.  OPERATIVE IMPLANTS: None  OPERATIVE PROCEDURE:  After receiving prophylactic antibiotics, the patient was escorted to the operative theatre and placed in a supine position.  A surgical "time-out" was performed during which the planned procedure, proposed operative site, and the correct patient identity were compared to the operative consent and agreement confirmed by the circulating nurse according to current facility policy.  Following application of a tourniquet to the operative extremity, the exposed skin was prepped with Chloraprep and draped in the usual sterile fashion.  The limb was exsanguinated with an Esmarch bandage and the tourniquet inflated to approximately higher than systolic BP.  The previous  laceration was opened, excising the skin edges and subcutaneous tissues excisionally.  It was extended and a combination of mid lateral and Bruner incisions proximally and distally.  The radial and ulnar digital nerves were identified and found to be intact.  The FDS was lacerated near its insertion of the middle phalanx, leaving a reparable amount of distal stump.  FDP was lacerated slightly distal to this such that there was enough stump to could be delivered from the A5 pulley.  The flexor tendons were identified proximal to the A1 pulley, and 30 looped Supramid suture was placed in each one of them and they were advanced through the flexor sheath into the site of laceration.  The FDS was repaired first, with 3-0 Supramid suture repairing each slip using that as course suture followed by 6-0 Prolene epitendinous suture.  The FDP was repaired in a similar fashion, ultimately as a 6 strand repair.  The FDP repair was augmented with a locked running 6-0 Prolene epitendinous suture that nicely shaped the repair.  Both tendons a good excursion through the sheath system after repair.  The digit could be fully extended without any gapping and rested in a nicely flexed posture.  The tourniquet was released, the base of the digit infiltrated with half percent Marcaine with epinephrine, additional hemostasis obtained, and the skin was closed with 4-0 Vicryl Rapide interrupted sutures.  A bulky splint dressing was applied with dorsal plaster component, placing the wrist in slight extension and the MPs flexed.  He was awakened and taken to the recovery room stable condition, breathing spontaneously.  DISPOSITION: He'll be discharged home today with typical instructions, hopefully getting into [facility] therapy in the next  several days to have a splint constructed and begin flexor tendon rehabilitation, followed by an appointment in approximately 10-15 days with me to check his wounds, his splint, and his rehabilitation  program.

## 2016-02-27 NOTE — H&P (View-Only) (Signed)
ORTHOPAEDIC CONSULTATION HISTORY & PHYSICAL REQUESTING PHYSICIAN: Pricilla Loveless, MD  Chief Complaint: R SF injury  HPI: Curtis Hodge is a 50 y.o. male who lacerated his R SF on the volar surface a couple of days ago.  He cannot flex the digit.  He is HIV+, Hep C +, and works as a Paediatric nurse.  Past Medical History  Diagnosis Date  . HIV (human immunodeficiency virus infection) (HCC)   . Hepatitis B   . Incarceration   . Emphysema/COPD [facility])    Past Surgical History  Procedure Laterality Date  . I&d extremity  05/21/2012    Procedure: IRRIGATION AND DEBRIDEMENT EXTREMITY;  Surgeon: [name], MD;  Location: [facility] OR;  Service: Orthopedics;  Laterality: Right;   Social History   Social History  . Marital Status: Single    Spouse Name: N/A  . Number of Children: N/A  . Years of Education: N/A   Social History Main Topics  . Smoking status: Current Every Day Smoker -- 0.25 packs/day    Types: Cigarettes  . Smokeless tobacco: Never Used  . Alcohol Use: Yes     Comment: Weekends.   . Drug Use: 5.00 per week    Special: Marijuana     Comment: 2-3 times a week.  Last used: 1 Week ago.   Marland Kitchen Sexual Activity: Not Asked     Comment: given condoms   Other Topics Concern  . None   Social History Narrative   No family history on file. No Known Allergies Prior to Admission medications   Medication Sig Start Date End Date Taking? Authorizing Provider  darunavir-cobicistat (PREZCOBIX) 800-150 MG per tablet Take 1 tablet by mouth daily. Patient taking differently: Take 1 tablet by mouth every morning.  02/13/15   Randall Hiss, MD  emtricitabine-tenofovir (TRUVADA) 200-300 MG per tablet Take 1 tablet by mouth daily. Patient taking differently: Take 1 tablet by mouth every morning.  02/09/15 02/09/16  Randall Hiss, MD  Gabapentin, PHN, 300 MG TABS Take 300 mg by mouth at bedtime as needed. Patient not taking: Reported on 09/27/2015 06/30/13   Randall Hiss, MD    ibuprofen (ADVIL,MOTRIN) 600 MG tablet Take 1 tablet (600 mg total) by mouth every 6 (six) hours as needed. 09/27/15   Lyndal Pulley, MD  sulfamethoxazole-trimethoprim (BACTRIM DS,SEPTRA DS) 800-160 MG tablet Take 1 tablet by mouth 2 (two) times daily. 02/22/16 02/29/16  Melton Krebs, PA-C  traMADol (ULTRAM) 50 MG tablet Take 1 tablet (50 mg total) by mouth every 6 (six) hours as needed. Patient not taking: Reported on 09/27/2015 09/27/15   Gilda Crease, MD   No results found.  Positive ROS: All other systems have been reviewed and were otherwise negative with the exception of those mentioned in the HPI and as above.  Physical Exam: Vitals: Refer to EMR. Constitutional:  WD, WN, NAD HEENT:  NCAT, EOMI Neuro/Psych:  Alert & oriented to person, place, and time; appropriate mood & affect Lymphatic: No generalized extremity edema or lymphadenopathy Extremities / MSK:  The extremities are normal with respect to appearance, ranges of motion, joint stability, muscle strength/tone, sensation, & perfusion except as otherwise noted:  R SF with small transverse lac over P1.  It lies completely extended.  No active flexion.  No flexion with passive FA muscle squeeze.  Intact sharp/dull on R and U sides of digit tip  Assessment: R SF laceration with laceration of FDP and FDS  Plan: I  D/w patient the diagnosis, options including observation, surgical repair, and amputation, including the postop expectations, need for rehab, etc.  He would like to proceed with repair.  Will schedule for next week.  He is being d/c today on oral antibiotics.    Cliffton Astersavid A. Janee Mornhompson, MD      Orthopaedic & Hand Surgery [facility] [address] [place], [place]  [id number] Office: [phone] Mobile: [phone]  02/22/2016, 7:48 PM

## 2016-02-28 ENCOUNTER — Encounter (HOSPITAL_BASED_OUTPATIENT_CLINIC_OR_DEPARTMENT_OTHER): Payer: Self-pay | Admitting: Orthopedic Surgery

## 2016-03-05 ENCOUNTER — Ambulatory Visit: Payer: Self-pay | Attending: Orthopedic Surgery | Admitting: Occupational Therapy

## 2016-03-05 DIAGNOSIS — M25541 Pain in joints of right hand: Secondary | ICD-10-CM | POA: Insufficient documentation

## 2016-03-05 DIAGNOSIS — R29898 Other symptoms and signs involving the musculoskeletal system: Secondary | ICD-10-CM | POA: Insufficient documentation

## 2016-03-05 DIAGNOSIS — M6281 Muscle weakness (generalized): Secondary | ICD-10-CM | POA: Insufficient documentation

## 2016-03-05 DIAGNOSIS — M25641 Stiffness of right hand, not elsewhere classified: Secondary | ICD-10-CM | POA: Insufficient documentation

## 2016-03-07 ENCOUNTER — Ambulatory Visit: Payer: Self-pay | Admitting: Occupational Therapy

## 2016-03-07 ENCOUNTER — Encounter: Payer: Self-pay | Admitting: Occupational Therapy

## 2016-03-07 DIAGNOSIS — M25641 Stiffness of right hand, not elsewhere classified: Secondary | ICD-10-CM

## 2016-03-07 DIAGNOSIS — M6281 Muscle weakness (generalized): Secondary | ICD-10-CM

## 2016-03-07 DIAGNOSIS — M25541 Pain in joints of right hand: Secondary | ICD-10-CM

## 2016-03-07 DIAGNOSIS — R29898 Other symptoms and signs involving the musculoskeletal system: Secondary | ICD-10-CM

## 2016-03-07 NOTE — Patient Instructions (Signed)
SPLINT WEAR AND CARE:    WEARING SCHEDULE:  Wear splint at ALL times except for hygiene care (May remove top finger strap only for exercises and then immediately replace strap as directed by the therapist)  PURPOSE:  To prevent movement and for protection until injury can heal  CARE OF SPLINT:  Keep splint away from heat sources including: stove, radiator or furnace, or a car in sunlight. The splint can melt and will no longer fit you properly  Keep away from pets and children  Clean the splint with rubbing alcohol 1-2 times per day.  * During this time, make sure you also clean your hand/arm as instructed by your therapist and/or perform dressing changes as needed. Then dry hand/arm completely before replacing splint. (When cleaning hand/arm, keep it immobilized in same position until splint is replaced)  PRECAUTIONS/POTENTIAL PROBLEMS: *If you notice or experience increased pain, swelling, numbness, or a lingering reddened area from the splint: Contact your therapist immediately by calling [phone]. You must wear the splint for protection, but we will get you scheduled for adjustments as quickly as possible.  (If only straps or hooks need to be replaced and NO adjustments to the splint need to be made, just call the office ahead and let them know you are coming in)  If you have any medical concerns or signs of infection, please call your doctor immediately    EXERCISES: Remove top finger strap only. Do below exercise 25 times, 6-8 times per day (about every 1.5 to  2 hours)   1. Bend fingers down only with other hand (focus on small finger), then remove hand and hold position if able, then straighten finger back all the way to back of splint (especially small finger) WITHOUT other hand   **Do NOT use Rt hand for anything including: gripping, lifting, holding, carrying, pushing, pulling. ONLY do above exercise as instructed

## 2016-03-07 NOTE — Therapy (Signed)
[facility] [address] [place], [place], [address] Phone: [phone]   Fax:  [phone]  Occupational Therapy Evaluation  Patient Details  Name: Curtis Hodge MRN: [id number] Date of Birth: [birth year] Referring Provider: Dr. Mack Hookavid Thompson  Encounter Date: 03/07/2016      OT End of Session - 03/07/16 1207    Visit Number 1   Number of Visits 16   Date for OT Re-Evaluation 05/06/16   Authorization Type self pay   OT Start Time 1015   OT Stop Time 1145   OT Time Calculation (min) 90 min   Equipment Utilized During Treatment splint   Activity Tolerance Patient tolerated treatment well      Past Medical History  Diagnosis Date  . HIV (human immunodeficiency virus infection) (HCC)   . Incarceration   . Emphysema/COPD (HCC)   . Hepatitis B     HepC    Past Surgical History  Procedure Laterality Date  . I&d extremity  05/21/2012    Procedure: IRRIGATION AND DEBRIDEMENT EXTREMITY;  Surgeon: Javier DockerJeffrey C Beane, MD;  Location: [facility];  Service: Orthopedics;  Laterality: Right;  . Flexor tendon repair Right 02/27/2016    Procedure: RIGHT SMALL FINGER FLEXOR TENDON REPAIR;  Surgeon: Mack Hookavid Thompson, MD;  Location: [facility];  Service: Orthopedics;  Laterality: Right;    There were no vitals filed for this visit.      Subjective Assessment - 03/07/16 1025    Subjective  I cut my finger sharpening a knife   Pertinent History HIV(+), COPD, emphysema   Patient Stated Goals Get back to work   Currently in Pain? Yes   Pain Score 7    Pain Location Finger (Comment which one)  ring and small fingers   Pain Orientation Right   Pain Descriptors / Indicators Throbbing;Stabbing   Pain Type Acute pain   Pain Frequency Constant   Aggravating Factors  cold temperature, hanging arm down   Pain Relieving Factors meds, elevation           [facility] OT Assessment - 03/07/16 0001    Assessment   Diagnosis  FDS and FDP flexor tendon repair zone II Rt small finger   Referring Provider Dr. Mack Hookavid Thompson   Onset Date 02/27/16  is surgery date (original injury 02/20/16)   Assessment Pt arrived wrapped/protected from post-surgical soft cast, however pt had duck taped around soft cast around wrist with circulation possibly compromised.    Prior Therapy none   Precautions   Precautions Other (comment)   Precaution Comments Per flexor tendon protocol   Required Braces or Orthoses Other Brace/Splint   Other Brace/Splint Dorsal block splint per flexor tendon repair protocol   Balance Screen   Has the patient fallen in the past 6 months No   Has the patient had a decrease in activity level because of a fear of falling?  No   Is the patient reluctant to leave their home because of a fear of falling?  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 Pt  unable to write and dependent for all IADLS and work tasks at this time.    Mobility   Mobility Status Independent   Written Expression   Dominant Hand Right   Handwriting --  unable at this time   Edema   Edema moderate at Rt small finger   ROM / Strength   AROM / PROM / Strength AROM   AROM   Overall AROM Comments Did not assess d/t time constraints however pt unable to actively flex of fully extend Rt small finger d/t precautions. Pt stiff with P/ROM in flexion, all other digitis WFL's                  OT Treatments/Exercises (OP) - 03/07/16 0001    ADLs   ADL Comments Pt's bandages and soft cast carefully removed, hand cleaned, and clean finger stockinette and hand stockinette  applied maintaining hand position. Pt also educated in splint wear and care, precautions hygiene care.    Exercises   Exercises Hand   Hand Exercises   Other Hand Exercises Issued initial HEP per flexor tendon protocol within confines of DBS (passive flexion and active extension)   Splinting   Splinting Fabricated and fitted dorsal block splint (DBS) per flexor tendon protocol and issued               OT Education - 03/07/16 1125    Education provided Yes   Education Details Precautions, splint wear and care, hygiene care, initial HEP within restraints of splint   Person(s) Educated Patient   Methods Explanation;Demonstration;Handout   Comprehension Verbalized understanding;Returned demonstration          OT Short Term Goals - 03/07/16 1211    OT SHORT TERM GOAL #1   Title Independent with splint wear and care (STG's due 04/06/16)   Time 4   Period Weeks   Status On-going   OT SHORT TERM GOAL #2   Title Independent with initial HEP    Time 4   Period Weeks   Status On-going   OT SHORT TERM GOAL #3   Title Pain less than or equal to 4/10 Rt hand with initial ex's   Baseline 7/10   Time 4   Period Weeks   Status New           OT Long Term Goals - 03/07/16 1215    OT LONG TERM GOAL #1   Title Independent with updated HEP (All LTG's due 05/06/16)   Time 8   Period Weeks   Status New   OT LONG TERM GOAL #2   Title Pt to return to using Rt dominant hand for all BADLS and writing    Time 8   Period Weeks   Status New   OT LONG TERM GOAL #3   Title Pt to demo 90% or greater full composite flexion and extension Rt hand    Time 8   Period Weeks   Status New   OT LONG TERM GOAL #4   Title Grip strength Rt dominant hand to be 25 lbs or greater for opening jars/containers   Time 8   Period Weeks   Status New               Plan - 03/07/16 1207    Clinical Impression Statement Pt is a 50 y.o. male who presents to outpatient rehab s/p flexor tendon  repair zone II (FDS & FDP) Rt small finger on 02/27/16 affecting Rt dominant hand. Pt arrived  fully wrapped and protected. Pt limited in all IADLS, writing, and work related tasks due to current precautions.    Rehab Potential Good   OT Frequency 2x / week   OT Duration 8 weeks   OT Treatment/Interventions Self-care/ADL training;Patient/family education;Ultrasound;Manual Therapy;Splinting;Therapeutic exercises;Parrafin;DME and/or AE instruction;Compression bandaging;Therapeutic activities;Electrical Stimulation;Fluidtherapy;Scar mobilization;Moist Heat;Contrast Bath;Passive range of motion   Plan splnt adjustments prn, review HEP    Consulted and Agree with Plan of Care Patient      Patient will benefit from skilled therapeutic intervention in order to improve the following deficits and impairments:  Decreased coordination, Decreased range of motion, Impaired flexibility, Increased edema, Impaired sensation, Decreased knowledge of precautions, Decreased skin integrity, Impaired UE functional use, Pain, Decreased scar mobility, Decreased strength  Visit Diagnosis: Pain in joints of right hand - Plan: Ot plan of care cert/re-cert  Stiffness of right hand, not elsewhere classified - Plan: Ot plan of care cert/re-cert  Other symptoms and signs involving the musculoskeletal system - Plan: Ot plan of care cert/re-cert  Muscle weakness (generalized) - Plan: Ot plan of care cert/re-cert    Problem List Patient Active Problem List   Diagnosis Date Noted  . HIV disease (HCC) 02/09/2015  . Bullous emphysema (HCC) 02/09/2015  . Musculoskeletal chest pain 02/09/2015  . Falling asleep and waking up too early in the evening 04/26/2014  . Sore throat 04/26/2014  . Sciatic pain 08/30/2013  . Low testosterone 08/30/2013  . Hemorrhoid 08/30/2013  . Blurry vision, bilateral 01/05/2013  . Lumbago 01/05/2013  . Weight loss 07/22/2012  . Face lacerations 07/22/2012  . Incarceration   . 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 CANNABIS ABUSE, EPISODIC 07/11/2010  . SMOKER 07/11/2010  . HIV INFECTION 06/26/2010    Kelli Churn, OTR/L 03/07/2016, 12:20 PM  [facility] [address] [place], [place], [address] Phone: [phone]   Fax:  [phone]  Name: Curtis Hodge MRN: [id number] Date of Birth: 07-01-1966

## 2016-03-14 ENCOUNTER — Ambulatory Visit: Payer: Self-pay | Admitting: Occupational Therapy

## 2016-03-19 ENCOUNTER — Ambulatory Visit: Payer: Self-pay | Admitting: Occupational Therapy

## 2016-03-20 ENCOUNTER — Ambulatory Visit: Payer: Self-pay | Attending: Orthopedic Surgery | Admitting: Occupational Therapy

## 2016-03-25 ENCOUNTER — Telehealth: Payer: Self-pay | Admitting: Occupational Therapy

## 2016-03-25 ENCOUNTER — Ambulatory Visit: Payer: Self-pay | Admitting: Occupational Therapy

## 2016-03-25 NOTE — Telephone Encounter (Signed)
Spoke with patient's mother re: missed O.T. appointments secondary to pt's number incorrectly listed. Reminded her of next appointment date/time, phone number and address. She reports she will give the message to patient.

## 2016-03-27 ENCOUNTER — Ambulatory Visit: Payer: Self-pay | Admitting: Occupational Therapy

## 2016-07-25 ENCOUNTER — Other Ambulatory Visit: Payer: Self-pay | Admitting: *Deleted

## 2016-07-25 ENCOUNTER — Telehealth: Payer: Self-pay

## 2016-07-25 ENCOUNTER — Other Ambulatory Visit: Payer: Self-pay

## 2016-07-25 DIAGNOSIS — B20 Human immunodeficiency virus [HIV] disease: Secondary | ICD-10-CM

## 2016-07-25 LAB — COMPLETE METABOLIC PANEL WITH GFR
ALBUMIN: 3.6 g/dL (ref 3.6–5.1)
ALK PHOS: 100 U/L (ref 40–115)
ALT: 59 U/L — ABNORMAL HIGH (ref 9–46)
AST: 41 U/L — AB (ref 10–40)
BILIRUBIN TOTAL: 0.6 mg/dL (ref 0.2–1.2)
BUN: 8 mg/dL (ref 7–25)
CO2: 29 mmol/L (ref 20–31)
Calcium: 9.1 mg/dL (ref 8.6–10.3)
Chloride: 102 mmol/L (ref 98–110)
Creat: 0.99 mg/dL (ref 0.60–1.35)
GFR, EST NON AFRICAN AMERICAN: 89 mL/min (ref >=60)
GFR, Est African American: >89 mL/min (ref >=60)
GLUCOSE: 79 mg/dL (ref 65–99)
Potassium: 4 mmol/L (ref 3.5–5.3)
SODIUM: 137 mmol/L (ref 135–146)
TOTAL PROTEIN: 7.4 g/dL (ref 6.1–8.1)

## 2016-07-25 LAB — CBC WITH DIFFERENTIAL/PLATELET
BASOS ABS: 0 {cells}/uL (ref 0–200)
Basophils Relative: 0 %
EOS PCT: 1 %
Eosinophils Absolute: 39 cells/uL (ref 15–500)
HEMATOCRIT: 37.3 % — AB (ref 38.5–50.0)
HEMOGLOBIN: 12.8 g/dL — AB (ref 13.2–17.1)
LYMPHS ABS: 2028 {cells}/uL (ref 850–3900)
Lymphocytes Relative: 52 %
MCH: 32.4 pg (ref 27.0–33.0)
MCHC: 34.3 g/dL (ref 32.0–36.0)
MCV: 94.4 fL (ref 80.0–100.0)
MONO ABS: 468 {cells}/uL (ref 200–950)
MPV: 11.5 fL (ref 7.5–12.5)
Monocytes Relative: 12 %
NEUTROS ABS: 1365 {cells}/uL — AB (ref 1500–7800)
Neutrophils Relative %: 35 %
Platelets: 108 10*3/uL — ABNORMAL LOW (ref 140–400)
RBC: 3.95 MIL/uL — ABNORMAL LOW (ref 4.20–5.80)
RDW: 13.6 % (ref 11.0–15.0)
WBC: 3.9 10*3/uL (ref 3.8–10.8)

## 2016-07-25 MED ORDER — SULFAMETHOXAZOLE-TRIMETHOPRIM 800-160 MG PO TABS: 1.0000 | ORAL_TABLET | Freq: Once | ORAL | 1 refills | Status: AC

## 2016-07-25 MED ORDER — SULFAMETHOXAZOLE-TRIMETHOPRIM 800-160 MG PO TABS: 1.0000 | ORAL_TABLET | Freq: Once | ORAL | 1 refills | Status: DC

## 2016-07-26 LAB — RPR

## 2016-07-26 LAB — T-HELPER CELL (CD4) - (RCID CLINIC ONLY)
CD4 % Helper T Cell: 15 % — ABNORMAL LOW (ref 33–55)
CD4 T Cell Abs: 310 /uL — ABNORMAL LOW (ref 400–2700)

## 2016-07-29 LAB — HIV-1 RNA QUANT-NO REFLEX-BLD
HIV 1 RNA QUANT: 24034 {copies}/mL — AB (ref <20)
HIV-1 RNA Quant, Log: 4.38 Log copies/mL — ABNORMAL HIGH (ref <1.30)

## 2016-08-07 ENCOUNTER — Ambulatory Visit (INDEPENDENT_AMBULATORY_CARE_PROVIDER_SITE_OTHER): Admitting: Infectious Disease

## 2016-08-07 ENCOUNTER — Encounter: Payer: Self-pay | Admitting: Infectious Disease

## 2016-08-07 VITALS — BP 118/81 | HR 90 | Temp 98.9°F | Wt 150.0 lb

## 2016-08-07 DIAGNOSIS — S0181XD Laceration without foreign body of other part of head, subsequent encounter: Secondary | ICD-10-CM

## 2016-08-07 DIAGNOSIS — R079 Chest pain, unspecified: Secondary | ICD-10-CM | POA: Insufficient documentation

## 2016-08-07 DIAGNOSIS — R0789 Other chest pain: Secondary | ICD-10-CM

## 2016-08-07 DIAGNOSIS — B181 Chronic viral hepatitis B without delta-agent: Secondary | ICD-10-CM

## 2016-08-07 DIAGNOSIS — R7989 Other specified abnormal findings of blood chemistry: Secondary | ICD-10-CM

## 2016-08-07 DIAGNOSIS — B20 Human immunodeficiency virus [HIV] disease: Secondary | ICD-10-CM

## 2016-08-07 DIAGNOSIS — J439 Emphysema, unspecified: Secondary | ICD-10-CM

## 2016-08-07 DIAGNOSIS — E291 Testicular hypofunction: Secondary | ICD-10-CM

## 2016-08-07 DIAGNOSIS — F329 Major depressive disorder, single episode, unspecified: Secondary | ICD-10-CM

## 2016-08-07 DIAGNOSIS — Z9119 Patient's noncompliance with other medical treatment and regimen: Secondary | ICD-10-CM | POA: Diagnosis not present

## 2016-08-07 DIAGNOSIS — Z23 Encounter for immunization: Secondary | ICD-10-CM | POA: Diagnosis not present

## 2016-08-07 DIAGNOSIS — Z91199 Patient's noncompliance with other medical treatment and regimen due to unspecified reason: Secondary | ICD-10-CM

## 2016-08-07 DIAGNOSIS — F32A Depression, unspecified: Secondary | ICD-10-CM

## 2016-08-07 HISTORY — DX: Depression, unspecified: F32.A

## 2016-08-07 HISTORY — DX: Other chest pain: R07.89

## 2016-08-07 MED ORDER — EMTRICITABINE-TENOFOVIR AF 200-25 MG PO TABS: 1.0000 | ORAL_TABLET | Freq: Every day | ORAL | 11 refills | Status: DC

## 2016-08-07 MED ORDER — DARUNAVIR-COBICISTAT 800-150 MG PO TABS: 1.0000 | ORAL_TABLET | Freq: Every day | ORAL | 11 refills | Status: DC

## 2016-08-07 MED ORDER — ALBUTEROL SULFATE HFA 108 (90 BASE) MCG/ACT IN AERS: 2.0000 | INHALATION_SPRAY | Freq: Four times a day (QID) | RESPIRATORY_TRACT | 6 refills | Status: DC | PRN

## 2016-08-07 NOTE — Progress Notes (Signed)
Subjective:   Chief complaints: "no one will take care of me, I am wheezing all the time, no one can do anything about my lungs, no one will give me medicine. I have chest heaviness that goes through to my back for past 4 months   Patient ID: Curtis Hodge, male    DOB: Aug 31, 1966, 50 y.o.   MRN: [id number]  HPI  50 year old [place] with HIV  That HAD been   controlled on prezista, norvir and truvada, then switched to PREZCOBIX and Truvada.   Unfortunately he has been out of care for many months having last seen me in June 2015.  He then was briefly in care in 2016 but never suppressed his virus. I have not seen him for over a year. He has since then been placed in jail and has not been able to get meds there either. He had labs here in early November but was not enrolled into [facility]  He is fairly upset that he does not have access to other specialists due to lack of insurance and now while in jail he has been further limited   Past Medical History:  Diagnosis Date  . Atypical chest pain 08/07/2016  . Depression 08/07/2016  . Emphysema/COPD (HCC)   . Hepatitis B    HepC  . HIV (human immunodeficiency virus infection) (HCC)   . Incarceration     Past Surgical History:  Procedure Laterality Date  . FLEXOR TENDON REPAIR Right 02/27/2016   Procedure: RIGHT SMALL FINGER FLEXOR TENDON REPAIR;  Surgeon: Mack Hookavid Thompson, MD;  Location: [facility];  Service: Orthopedics;  Laterality: Right;  . I&D EXTREMITY  05/21/2012   Procedure: IRRIGATION AND DEBRIDEMENT EXTREMITY;  Surgeon: Javier DockerJeffrey C Beane, MD;  Location: [facility];  Service: Orthopedics;  Laterality: Right;    No family history on file.    Social History   Social History  . Marital status: Single    Spouse name: N/A  . Number of children: N/A  . Years of education: N/A   Social History Main Topics  . Smoking status: Former Smoker    Packs/day: 0.50    Types: Cigarettes    Quit date: 05/02/2016  .  Smokeless tobacco: Never Used  . Alcohol use No     Comment: Weekends.   . Drug use: No     Comment: 2-3 times a week.  Last used: 2 d ago 02-20-16  . Sexual activity: Not Asked     Comment: given condoms   Other Topics Concern  . None   Social History Narrative  . None    No Known Allergies   Current Outpatient Prescriptions:  .  albuterol (PROVENTIL HFA;VENTOLIN HFA) 108 (90 Base) MCG/ACT inhaler, Inhale 2 puffs into the lungs every 6 (six) hours as needed for wheezing or shortness of breath., Disp: 1 Inhaler, Rfl: 6 .  darunavir-cobicistat (PREZCOBIX) 800-150 MG tablet, Take 1 tablet by mouth daily., Disp: 30 tablet, Rfl: 11 .  emtricitabine-tenofovir AF (DESCOVY) 200-25 MG tablet, Take 1 tablet by mouth daily., Disp: 30 tablet, Rfl: 11      Review of Systems  Constitutional: Positive for fatigue. Negative for activity change, appetite change, chills, diaphoresis, fever and unexpected weight change.  HENT: Negative for congestion, rhinorrhea, sinus pressure, sneezing, sore throat and trouble swallowing.   Eyes: Negative for photophobia and visual disturbance.  Respiratory: Positive for chest tightness, shortness of breath and wheezing. Negative for cough and stridor.  Cardiovascular: Positive for chest pain and palpitations. Negative for leg swelling.  Gastrointestinal: Negative for abdominal distention, abdominal pain, anal bleeding, blood in stool, constipation, diarrhea, nausea and vomiting.  Genitourinary: Negative for difficulty urinating, dysuria, flank pain and hematuria.  Musculoskeletal: Negative for arthralgias, back pain, gait problem, joint swelling and myalgias.  Skin: Negative for color change, pallor, rash and wound.  Neurological: Negative for dizziness, tremors, weakness and light-headedness.  Hematological: Negative for adenopathy. Does not bruise/bleed easily.  Psychiatric/Behavioral: Positive for behavioral problems and dysphoric mood. Negative for  agitation, confusion, decreased concentration and sleep disturbance.       Objective:   Physical Exam  Constitutional: He is oriented to person, place, and time. He appears well-developed and well-nourished.  HENT:  Head: Normocephalic and atraumatic.  Eyes: Conjunctivae and EOM are normal.  Neck: Normal range of motion. Neck supple.  Cardiovascular: Normal rate, regular rhythm and normal heart sounds.  Exam reveals no gallop and no friction rub.   No murmur heard. Pulmonary/Chest: Effort normal and breath sounds normal. No respiratory distress. He has no wheezes. He has no rales. He exhibits tenderness.  Abdominal: Soft. Bowel sounds are normal. He exhibits no distension. There is no tenderness. There is no rebound.  Musculoskeletal: Normal range of motion. He exhibits no edema or tenderness.  Neurological: He is alert and oriented to person, place, and time.  Skin: Skin is warm and dry. No rash noted. No erythema. No pallor.  Psychiatric: Judgment and thought content normal. His mood appears anxious. His affect is labile. His speech is rapid and/or pressured. He is agitated. He exhibits a depressed mood.          Assessment & Plan:  HIV: disease: Renew ADAP, restart Prezcobix and Descovy and recheck labs in one month after starting them. It does not appear that we can do [facility]  Bullous emphysema: He has some fairly substantial changes in the lungs and perhaps might benefit from lung reduction surgery. But right now needs to be consistenly in care. I gave him albuterol becasuse he has said this helps with dyspnea and wheezing  Chronic hepatitis B without delta agent and without,: will be on descovy and will need bi-annual [place] to screen for [facility]   Disability needs: I have supported patients's claims that he  should qualify with disability given his extensive lung disease which should be making it difficult for him to work.   Atypical chest pain: he has had chest pain in middle  of his chest to his back with deep breathing for a month: I dont find it to be very typical the way he describes this  Depression: would like him to meet with Lennox Laity or Bernette Redbird and we can consider an SSRI  I spent greater than 25 minutes with the patient including greater than 50% of time in face to face counsel of the patient re his HIV, emphesyma, chronic hepatitis B, atypical chest pain and depression and in coordination of his care.

## 2016-08-09 ENCOUNTER — Encounter: Payer: Self-pay | Admitting: Infectious Disease

## 2016-08-21 ENCOUNTER — Other Ambulatory Visit: Payer: Self-pay | Admitting: *Deleted

## 2016-08-21 DIAGNOSIS — Z9119 Patient's noncompliance with other medical treatment and regimen: Secondary | ICD-10-CM

## 2016-08-21 DIAGNOSIS — R7989 Other specified abnormal findings of blood chemistry: Secondary | ICD-10-CM

## 2016-08-21 DIAGNOSIS — B20 Human immunodeficiency virus [HIV] disease: Secondary | ICD-10-CM

## 2016-08-21 DIAGNOSIS — Z91199 Patient's noncompliance with other medical treatment and regimen due to unspecified reason: Secondary | ICD-10-CM

## 2016-08-21 MED ORDER — DARUNAVIR-COBICISTAT 800-150 MG PO TABS: 1.0000 | ORAL_TABLET | Freq: Every day | ORAL | 5 refills | Status: DC

## 2016-08-21 MED ORDER — EMTRICITABINE-TENOFOVIR AF 200-25 MG PO TABS: 1.0000 | ORAL_TABLET | Freq: Every day | ORAL | 5 refills | Status: DC

## 2016-08-25 ENCOUNTER — Emergency Department (HOSPITAL_COMMUNITY)
Admission: EM | Admit: 2016-08-25 | Discharge: 2016-08-26 | Disposition: A | Discharge status: Home / Self Care | Payer: Self-pay | Attending: Emergency Medicine | Admitting: Emergency Medicine

## 2016-08-25 ENCOUNTER — Emergency Department (HOSPITAL_COMMUNITY): Payer: Self-pay

## 2016-08-25 ENCOUNTER — Encounter (HOSPITAL_COMMUNITY): Payer: Self-pay | Admitting: Emergency Medicine

## 2016-08-25 DIAGNOSIS — R0789 Other chest pain: Secondary | ICD-10-CM

## 2016-08-25 DIAGNOSIS — F17210 Nicotine dependence, cigarettes, uncomplicated: Secondary | ICD-10-CM | POA: Insufficient documentation

## 2016-08-25 DIAGNOSIS — M62838 Other muscle spasm: Secondary | ICD-10-CM | POA: Insufficient documentation

## 2016-08-25 DIAGNOSIS — J449 Chronic obstructive pulmonary disease, unspecified: Secondary | ICD-10-CM | POA: Insufficient documentation

## 2016-08-25 LAB — I-STAT TROPONIN, ED: Troponin i, poc: 0 ng/mL (ref 0.00–0.08)

## 2016-08-25 MED ORDER — METHOCARBAMOL 1000 MG/10ML IJ SOLN
500.0000 mg | Freq: Once | INTRAMUSCULAR | Status: AC
  Administered 2016-08-26: 500 mg via INTRAVENOUS
  Filled 2016-08-25: qty 5

## 2016-08-25 MED ORDER — ORPHENADRINE CITRATE 30 MG/ML IJ SOLN
60.0000 mg | Freq: Once | INTRAMUSCULAR | Status: DC
  Filled 2016-08-25: qty 2

## 2016-08-25 MED ORDER — METHOCARBAMOL 1000 MG/10ML IJ SOLN
500.0000 mg | Freq: Once | INTRAMUSCULAR | Status: DC
  Filled 2016-08-25: qty 5

## 2016-08-25 MED ORDER — KETOROLAC TROMETHAMINE 15 MG/ML IJ SOLN
15.0000 mg | Freq: Once | INTRAMUSCULAR | Status: AC
  Administered 2016-08-25: 15 mg via INTRAVENOUS
  Filled 2016-08-25: qty 1

## 2016-08-25 NOTE — ED Triage Notes (Addendum)
Pt reports CP L chest radiating to neck, reports SHOB, headache. HX HIV, emphysema. Pt grunting in triage. Pt given 324MG  Aspirin by EMS, no nitro given.

## 2016-08-25 NOTE — ED Provider Notes (Signed)
[facility]-EMERGENCY DEPT Provider Note   CSN: [id number] Arrival date & time: 08/25/16  2254  By signing my name below, I, Arianna Nassar, attest that this documentation has been prepared under the direction and in the presence of Nira Conn, MD.  Electronically Signed: Octavia Heir, ED Scribe. 08/25/16. 11:33 PM.    History   Chief Complaint Chief Complaint  Patient presents with  . Chest Pain     The history is provided by the patient. No language interpreter was used.   HPI Comments: Curtis Hodge is a 50 y.o. male who who has a PMhx of hepatitis B, HIV, and emphysema presents to the Emergency Department complaining of sudden onset, gradual worsening, squeezing, left sided chest pain x 1 hour. He notes having radiating pain into his left neck and left thoracic back. Pt says his pain is constant. Pt has had similar pain in the past but he has not had any this severe. He denies any recent falls. He received 324 mg of ASA via EMS. He is compliant with his HIV and Hep B medication. His last CD4 count was 310 ~ 1 week ago. Pt has not had a heart catheterization or stress test performed. He denies hx of MI, hx of DVT, recent long car rides or surgeries, cough or fever.  Past Medical History:  Diagnosis Date  . Atypical chest pain 08/07/2016  . Depression 08/07/2016  . Emphysema/COPD (HCC)   . Hepatitis B    HepC  . HIV (human immunodeficiency virus infection) (HCC)   . Incarceration     Patient Active Problem List   Diagnosis Date Noted  . Atypical chest pain 08/07/2016  . Depression 08/07/2016  . HIV disease (HCC) 02/09/2015  . Bullous emphysema (HCC) 02/09/2015  . Musculoskeletal chest pain 02/09/2015  . Falling asleep and waking up too early in the evening 04/26/2014  . Sore throat 04/26/2014  . Sciatic pain 08/30/2013  . Low testosterone 08/30/2013  . Hemorrhoid 08/30/2013  . Blurry vision, bilateral 01/05/2013  . Lumbago 01/05/2013  . Weight loss 07/22/2012    . Face lacerations 07/22/2012  . Incarceration   . Chronic hepatitis B without delta agent without hepatic coma (HCC) 07/11/2010  . CANNABIS ABUSE, EPISODIC 07/11/2010  . SMOKER 07/11/2010  . HIV INFECTION 06/26/2010    Past Surgical History:  Procedure Laterality Date  . FLEXOR TENDON REPAIR Right 02/27/2016   Procedure: RIGHT SMALL FINGER FLEXOR TENDON REPAIR;  Surgeon: [name], MD;  Location: [facility];  Service: Orthopedics;  Laterality: Right;  . I&D EXTREMITY  05/21/2012   Procedure: IRRIGATION AND DEBRIDEMENT EXTREMITY;  Surgeon: [name], MD;  Location: [facility] OR;  Service: Orthopedics;  Laterality: Right;       Home Medications    Prior to Admission medications   Medication Sig Start Date End Date Taking? Authorizing Provider  darunavir-cobicistat (PREZCOBIX) 800-150 MG tablet Take 1 tablet by mouth daily. 08/21/16  Yes Randall Hiss, MD  emtricitabine-tenofovir AF (DESCOVY) 200-25 MG tablet Take 1 tablet by mouth daily. 08/21/16  Yes Randall Hiss, MD  albuterol (PROVENTIL HFA;VENTOLIN HFA) 108 [phone] Base) MCG/ACT inhaler Inhale 2 puffs into the lungs every 6 (six) hours as needed for wheezing or shortness of breath. 08/07/16   Randall Hiss, MD  methocarbamol (ROBAXIN) 500 MG tablet Take 1 tablet (500 mg total) by mouth 2 (two) times daily. 08/26/16 09/02/16  Nira Conn, MD    Family History No  family history on file.  Social History Social History  Substance Use Topics  . Smoking status: Current Some Day Smoker    Packs/day: 0.50    Types: Cigarettes    Last attempt to quit: 05/02/2016  . Smokeless tobacco: Never Used  . Alcohol use Yes     Comment: Weekends.      Allergies   Review of patient's allergies indicates no known allergies.   Review of Systems Review of Systems  A complete 10 system review of systems was obtained and all systems are negative except as noted in the HPI and PMH.   Physical  Exam Updated Vital Signs BP 134/96   Pulse 94   Temp 98.5 F (36.9 C) (Oral)   Resp 18   Ht 5\' 10"  (1.778 m)   Wt 155 lb (70.3 kg)   SpO2 93%   BMI 22.24 kg/m   Physical Exam  Constitutional: He is oriented to person, place, and time. He appears well-developed and well-nourished. No distress.  HENT:  Head: Normocephalic and atraumatic.  Nose: Nose normal.  Eyes: Conjunctivae and EOM are normal. Pupils are equal, round, and reactive to light. Right eye exhibits no discharge. Left eye exhibits no discharge. No scleral icterus.  Neck: Normal range of motion. Neck supple.  Cardiovascular: Normal rate and regular rhythm.  Exam reveals no gallop and no friction rub.   No murmur heard. Pulmonary/Chest: Effort normal and breath sounds normal. No stridor. No respiratory distress. He has no rales.  Abdominal: Soft. He exhibits no distension. There is no tenderness.  Musculoskeletal: He exhibits tenderness. He exhibits no edema.  - excruciating pain and tenderness to left exterior shoulder girdle muscles.   Neurological: He is alert and oriented to person, place, and time.  Skin: Skin is warm and dry. No rash noted. He is not diaphoretic. No erythema.  Psychiatric: He has a normal mood and affect.  Nursing note and vitals reviewed.    ED Treatments / Results  DIAGNOSTIC STUDIES: Oxygen Saturation is 96% on RA, adequate by my interpretation.  COORDINATION OF CARE:  11:19 PM Discussed treatment plan with pt at bedside and pt agreed to plan.  Labs (all labs ordered are listed, but only abnormal results are displayed) Labs Reviewed  BASIC METABOLIC PANEL - Abnormal; Notable for the following:       Result Value   Glucose, Bld 120 (*)    Calcium 8.8 (*)    All other components within normal limits  CBC - Abnormal; Notable for the following:    RBC 3.64 (*)    Hemoglobin 12.1 (*)    HCT 36.0 (*)    Platelets 126 (*)    All other components within normal limits  D-DIMER,  QUANTITATIVE (NOT AT [facility]) - Abnormal; Notable for the following:    D-Dimer, Quant 0.51 (*)    All other components within normal limits  I-STAT TROPOININ, ED    EKG  EKG Interpretation  Date/Time:  Sunday August 25 2016 22:59:24 EDT Ventricular Rate:  73 PR Interval:    QRS Duration: 89 QT Interval:  375 QTC Calculation: 414 R Axis:   39 Text Interpretation:  Sinus arrhythmia Ventricular premature complex No significant change since last tracing Confirmed by [name] Center[facility]ARD[facility] MD, PEDRO ([id number]) on 08/25/2016 11:11:45 PM       Radiology Dg Chest 2 View  Result Date: 08/26/2016 CLINICAL DATA:  Acute onset of left-sided chest pain. Initial encounter. EXAM: CHEST  2 VIEW COMPARISON:  Chest radiograph performed  09/27/2015 FINDINGS: The lungs are well-aerated. Bilateral emphysematous change is noted, with bulla at the lung apices, much larger on the right. Mild bibasilar opacities may reflect atelectasis or possibly mild infection. There is no evidence of pleural effusion or pneumothorax. The heart is normal in size; the mediastinal contour is within normal limits. No acute osseous abnormalities are seen. IMPRESSION: Bilateral emphysematous change, with bulla at the lung apices, much larger on the right. Mild bibasilar opacities may reflect atelectasis or possibly mild infection. Electronically Signed   By: Roanna Raider M.D.   On: 08/26/2016 00:34   Ct Angio Chest Pe W Or Wo Contrast  Result Date: 08/26/2016 CLINICAL DATA:  Left-sided chest pain. EXAM: CT ANGIOGRAPHY CHEST WITH CONTRAST TECHNIQUE: Multidetector CT imaging of the chest was performed using the standard protocol during bolus administration of intravenous contrast. Multiplanar CT image reconstructions and MIPs were obtained to evaluate the vascular anatomy. CONTRAST:  100 cc Isovue 370 IV COMPARISON:  Radiographs yesterday at 2328 hour. Chest CT 02/08/2015 FINDINGS: Cardiovascular: Satisfactory opacification of the pulmonary arteries to  the segmental level. No evidence of pulmonary embolism. Normal heart size. Normal caliber thoracic aorta with mild atherosclerosis, no evidence of dissection. Coronary artery calcifications are seen. No pericardial effusion. Mediastinum/Nodes: No enlarged mediastinal, hilar, or axillary lymph nodes. Thyroid gland, trachea, and esophagus demonstrate no significant findings. Lungs/Pleura: Advanced emphysema with large bulla at the apices, right greater than left. Patchy dependent opacities in both lower lobes. No pleural effusion. Upper Abdomen: Subcentimeter hypodensities in the subcapsular right lobe of the liver, incompletely characterize but likely cysts. No acute abnormalities. Ingested material within the stomach. Musculoskeletal: There are no acute or suspicious osseous abnormalities. Review of the MIP images confirms the above findings. IMPRESSION: 1. No pulmonary embolus. 2. Advanced emphysema with large apical bulla, right greater than left. 3. Patchy dependent lower lobe opacities, favoring atelectasis, however pneumonia or aspiration could have a similar appearance. Electronically Signed   By: Rubye Oaks M.D.   On: 08/26/2016 04:54    Procedures Procedures (including critical care time)  Medications Ordered in ED Medications  ketorolac (TORADOL) 15 MG/ML injection 15 mg (15 mg Intravenous Given 08/25/16 2352)  methocarbamol (ROBAXIN) 500 mg in dextrose 5 % 50 mL IVPB (500 mg Intravenous New Bag/Given 08/26/16 0049)  fentaNYL (SUBLIMAZE) injection 50 mcg (50 mcg Intravenous Given 08/26/16 0341)  iopamidol (ISOVUE-370) 76 % injection (100 mLs  Contrast Given 08/26/16 0419)     Initial Impression / Assessment and Plan / ED Course  I have reviewed the triage vital signs and the nursing notes.  Pertinent labs & imaging results that were available during my care of the patient were reviewed by me and considered in my medical decision making (see chart for details).  Clinical Course     Atypical chest pain most consistent with muscle spasms of the left shoulder girdle muscles. EKG: Normal sinus rhythm, intervals, axis. No evidence of acute ischemia, arrhythmias, or blocks. Chest x-ray with baseline bolus emphysema without evidence of pneumothorax, pneumomediastinum, pneumonia, pleural effusions, abnormal mediastinal contour. Troponin negative.  D-dimer was positive. Obtain a CTA which was negative for pulmonary embolism.   Presentation is not consistent with aortic dissection or esophageal perforation.  Patient provided with symptomatic pain medicine and muscle relaxers.  Safe for discharge with strict return precautions and close PCP follow-up.  I personally performed the services described in this documentation, which was scribed in my presence. The recorded information has been reviewed and is accurate.  Final Clinical Impressions(s) / ED Diagnoses   Final diagnoses:  Chest wall pain  Muscle spasm   Disposition: Discharge  Condition: Good  I have discussed the results, Dx and Tx plan with the patient who expressed understanding and agree(s) with the plan. Discharge instructions discussed at great length. The patient was given strict return precautions who verbalized understanding of the instructions. No further questions at time of discharge.    New Prescriptions   METHOCARBAMOL (ROBAXIN) 500 MG TABLET    Take 1 tablet (500 mg total) by mouth 2 (two) times daily.    Follow Up: Lavinia Sharps, NP [address] [place] [address] [phone]  Schedule an appointment as soon as possible for a visit  in 5-7 days, If symptoms do not improve or  worsen      Nira Conn, MD 08/26/16 [phone]

## 2016-08-26 ENCOUNTER — Emergency Department (HOSPITAL_COMMUNITY): Payer: Self-pay

## 2016-08-26 ENCOUNTER — Encounter (HOSPITAL_COMMUNITY): Payer: Self-pay | Admitting: Radiology

## 2016-08-26 LAB — BASIC METABOLIC PANEL
ANION GAP: 8 (ref 5–15)
BUN: 8 mg/dL (ref 6–20)
CO2: 29 mmol/L (ref 22–32)
Calcium: 8.8 mg/dL — ABNORMAL LOW (ref 8.9–10.3)
Chloride: 103 mmol/L (ref 101–111)
Creatinine, Ser: 1.18 mg/dL (ref 0.61–1.24)
GFR calc Af Amer: >60 mL/min (ref >60)
GFR calc non Af Amer: >60 mL/min (ref >60)
GLUCOSE: 120 mg/dL — AB (ref 65–99)
POTASSIUM: 3.6 mmol/L (ref 3.5–5.1)
Sodium: 140 mmol/L (ref 135–145)

## 2016-08-26 LAB — D-DIMER, QUANTITATIVE: D-Dimer, Quant: 0.51 ug/mL-FEU — ABNORMAL HIGH (ref 0.00–0.50)

## 2016-08-26 LAB — CBC
HEMATOCRIT: 36 % — AB (ref 39.0–52.0)
Hemoglobin: 12.1 g/dL — ABNORMAL LOW (ref 13.0–17.0)
MCH: 33.2 pg (ref 26.0–34.0)
MCHC: 33.6 g/dL (ref 30.0–36.0)
MCV: 98.9 fL (ref 78.0–100.0)
PLATELETS: 126 10*3/uL — AB (ref 150–400)
RBC: 3.64 MIL/uL — ABNORMAL LOW (ref 4.22–5.81)
RDW: 14.2 % (ref 11.5–15.5)
WBC: 4.1 10*3/uL (ref 4.0–10.5)

## 2016-08-26 MED ORDER — IOPAMIDOL (ISOVUE-370) INJECTION 76%
INTRAVENOUS | Status: AC
  Administered 2016-08-26: 100 mL
  Filled 2016-08-26: qty 100

## 2016-08-26 MED ORDER — FENTANYL CITRATE (PF) 100 MCG/2ML IJ SOLN
50.0000 ug | Freq: Once | INTRAMUSCULAR | Status: AC
  Administered 2016-08-26: 50 ug via INTRAVENOUS
  Filled 2016-08-26: qty 2

## 2016-08-26 MED ORDER — METHOCARBAMOL 500 MG PO TABS: 500.0000 mg | ORAL_TABLET | Freq: Two times a day (BID) | ORAL | 0 refills | Status: AC

## 2016-09-23 ENCOUNTER — Ambulatory Visit: Admitting: Infectious Disease

## 2016-10-07 ENCOUNTER — Encounter: Payer: Self-pay | Admitting: Occupational Therapy

## 2016-10-07 ENCOUNTER — Ambulatory Visit (INDEPENDENT_AMBULATORY_CARE_PROVIDER_SITE_OTHER): Payer: Self-pay | Admitting: Pharmacist Clinician (PhC)/ Clinical Pharmacy Specialist

## 2016-10-07 DIAGNOSIS — B20 Human immunodeficiency virus [HIV] disease: Secondary | ICD-10-CM

## 2016-10-07 LAB — CBC WITH DIFFERENTIAL/PLATELET
BASOS PCT: 0 %
Basophils Absolute: 0 cells/uL (ref 0–200)
EOS ABS: 0 {cells}/uL — AB (ref 15–500)
Eosinophils Relative: 0 %
HEMATOCRIT: 39.1 % (ref 38.5–50.0)
Hemoglobin: 13.6 g/dL (ref 13.2–17.1)
LYMPHS ABS: 1000 {cells}/uL (ref 850–3900)
LYMPHS PCT: 40 %
MCH: 33.9 pg — ABNORMAL HIGH (ref 27.0–33.0)
MCHC: 34.8 g/dL (ref 32.0–36.0)
MCV: 97.5 fL (ref 80.0–100.0)
MONO ABS: 250 {cells}/uL (ref 200–950)
MPV: 11.4 fL (ref 7.5–12.5)
Monocytes Relative: 10 %
NEUTROS ABS: 1250 {cells}/uL — AB (ref 1500–7800)
Neutrophils Relative %: 50 %
PLATELETS: 132 10*3/uL — AB (ref 140–400)
RBC: 4.01 MIL/uL — AB (ref 4.20–5.80)
RDW: 13.7 % (ref 11.0–15.0)
WBC: 2.5 10*3/uL — AB (ref 3.8–10.8)

## 2016-10-07 LAB — COMPLETE METABOLIC PANEL WITH GFR
ALT: 30 U/L (ref 9–46)
AST: 32 U/L (ref 10–40)
Albumin: 3.8 g/dL (ref 3.6–5.1)
Alkaline Phosphatase: 91 U/L (ref 40–115)
BILIRUBIN TOTAL: 0.8 mg/dL (ref 0.2–1.2)
BUN: 8 mg/dL (ref 7–25)
CALCIUM: 9 mg/dL (ref 8.6–10.3)
CHLORIDE: 105 mmol/L (ref 98–110)
CO2: 29 mmol/L (ref 20–31)
CREATININE: 1.15 mg/dL (ref 0.60–1.35)
GFR, Est African American: 86 mL/min (ref >=60)
GFR, Est Non African American: 74 mL/min (ref >=60)
Glucose, Bld: 85 mg/dL (ref 65–99)
Potassium: 4.4 mmol/L (ref 3.5–5.3)
Sodium: 138 mmol/L (ref 135–146)
TOTAL PROTEIN: 7.2 g/dL (ref 6.1–8.1)

## 2016-10-07 MED ORDER — MOMETASONE FURO-FORMOTEROL FUM 100-5 MCG/ACT IN AERO: 2.0000 | INHALATION_SPRAY | Freq: Two times a day (BID) | RESPIRATORY_TRACT | 5 refills | Status: DC

## 2016-10-07 NOTE — Patient Instructions (Addendum)
Continue your Prezcobix and Descovy I'll follow up to see if you can qualify for the inhalers We are going to get labs today

## 2016-10-07 NOTE — Therapy (Signed)
[facility] [address]  [address] [place], [place], [address] Phone: [phone]   Fax:  [phone]  Patient Details  Name: Curtis Hodge MRN: [id number] Date of Birth: [birth year] Referring Provider:  Dr. Mack Hookavid Thompson Encounter Date: 10/07/2016   Pt did not return after initial evaluation on 03/07/16 therefore will d/c episode of care at this time.    Kelli ChurnBallie, Cher Franzoni Johnson, OTR/L 10/07/2016, 10:45 AM  [facility] [place] [address]  [address] [place], [place], [address] Phone: [phone]   Fax:  [phone]

## 2016-10-07 NOTE — Progress Notes (Signed)
HPI: Curtis Hodge R Nauman is a 50 y.o. male who is here for his pharmacy follow up of his HIV.    Allergies: No Known Allergies  Vitals:    Past Medical History: Past Medical History:  Diagnosis Date  . Atypical chest pain 08/07/2016  . Depression 08/07/2016  . Emphysema/COPD (HCC)   . Hepatitis B    HepC  . HIV (human immunodeficiency virus infection) (HCC)   . Incarceration     Social History: Social History   Social History  . Marital status: Single    Spouse name: N/A  . Number of children: N/A  . Years of education: N/A   Social History Main Topics  . Smoking status: Current Some Day Smoker    Packs/day: 0.50    Types: Cigarettes    Last attempt to quit: 05/02/2016  . Smokeless tobacco: Never Used  . Alcohol use Yes     Comment: Weekends.   . Drug use: No     Comment: 2-3 times a week.  Last used: 2 d ago 02-20-16  . Sexual activity: Not on file     Comment: given condoms   Other Topics Concern  . Not on file   Social History Narrative  . No narrative on file    Previous Regimen:   Current Regimen: Prezcobix/Descovy  Labs: HIV 1 RNA Quant (copies/mL)  Date Value  07/25/2016 24,034 (H)  02/09/2015 10,670 (H)  03/30/2014 664 (H)   CD4 T Cell Abs (/uL)  Date Value  07/25/2016 310 (L)  02/09/2015 100 (L)  03/30/2014 410   Hep B S Ab (no units)  Date Value  06/26/2010 INDETER (A)   Hepatitis B Surface Ag (no units)  Date Value  06/26/2010 POS (A)   HCV Ab (no units)  Date Value  06/26/2010 NEG    CrCl: CrCl cannot be calculated (Patient's most recent lab result is older than the maximum 21 days allowed.).  Lipids:    Component Value Date/Time   CHOL 131 02/09/2015 1225   TRIG 67 02/09/2015 1225   HDL 21 (L) 02/09/2015 1225   CHOLHDL 6.2 02/09/2015 1225   VLDL 13 02/09/2015 1225   LDLCALC 97 02/09/2015 1225    Assessment: [name] fell out of care for a while from our clinic. He recently saw Dr. Daiva EvesVan Dam to restart his ART. For the  past 2 months, he said that he has only missed one dose of his ART. He has had this chronic issue with his COPD and could not get any inhalers due to his ADAP. [place] ADAP do not has any steroids inhalers on their formulary. [facility] see if they could cover this. They were able to override it [facility] the [facility]. It's the 3rd line agent to use when prescobix is on board. We are going to keep him at the lower dosing range. He has been approved for it and they will mail it to him. We are going to get labs in him today.  Recommendations:  Cont Prez/Descovy Start Dulera 100/5 2 puffs BID [facility] labs today  Ulyses [place], Ananda Caya HumansvilleQuang, PharmD, BCPS, AAHIVP, CPP Clinical Infectious Disease Pharmacist [facility] 10/07/2016, 2:51 PM

## 2016-10-09 LAB — HIV-1 RNA ULTRAQUANT REFLEX TO GENTYP+
HIV 1 RNA QUANT: 57 {copies}/mL — AB (ref <20)
HIV-1 RNA QUANT, LOG: 1.76 {Log_copies}/mL — AB (ref <1.30)

## 2017-01-27 ENCOUNTER — Other Ambulatory Visit

## 2017-01-27 ENCOUNTER — Ambulatory Visit

## 2017-01-31 ENCOUNTER — Ambulatory Visit (HOSPITAL_COMMUNITY)
Admission: EM | Admit: 2017-01-31 | Discharge: 2017-01-31 | Disposition: A | Discharge status: Home / Self Care | Attending: Emergency Medicine | Admitting: Emergency Medicine

## 2017-01-31 ENCOUNTER — Encounter (HOSPITAL_COMMUNITY): Payer: Self-pay | Admitting: Emergency Medicine

## 2017-01-31 DIAGNOSIS — M79644 Pain in right finger(s): Secondary | ICD-10-CM

## 2017-01-31 MED ORDER — IBUPROFEN 600 MG PO TABS: 600.0000 mg | ORAL_TABLET | Freq: Four times a day (QID) | ORAL | 0 refills | Status: DC | PRN

## 2017-01-31 NOTE — ED Triage Notes (Signed)
[facility] a [facility] of chronic finger pain and limited ROM on the linky finger on his right hand. The patient stated that he had surgery over 1 year ago and has not "been right" since.

## 2017-01-31 NOTE — Discharge Instructions (Signed)
I have arranged an appointment with Dr. Janee Mornhompson on Monday 3/19 at [facility] at [facility] for you. Their phone number is [phone],. Continue local wound care. 600 mg ibuprofen with 1 g of Tylenol 3-4 times a day as needed for pain.

## 2017-01-31 NOTE — ED Provider Notes (Signed)
HPI  SUBJECTIVE:  Curtis Hodge is a right handed 51 y.o. male who presents with 6-7 months of a painful knot in his right little finger and 1-2 months of constant pain and drainage. Patient sustained a laceration of his flexor tendon of his right little finger last year 02/2016. He was then seen by Dr. Mack Hookavid Thompson, and this was repaired on 02/27/2016. he went to 1 occupational therapy appointment, and he missed the rest due to transportation and housing issues. He is here because he has had 6-7 months of a painful knot along the proximal phalanx. States the pain is constant. He reports two "holes" that occasionally drain a discolored milky white material for the past month or so. He reports tingling in his distal fingertip for the past month or 2. He states he never recovered mobility of his finger. He was [facility] on 2 months [facility] and states that he finished it one month ago. Past medical history of HIV, does not know his viral load or CD4 count, hepatitis B, hepatitis C, COPD. he still smokes.  PMD: Dr. Daiva EvesVan Dam  Past Medical History:  Diagnosis Date  . Atypical chest pain 08/07/2016  . Depression 08/07/2016  . Emphysema/COPD (HCC)   . Hepatitis B    HepC  . HIV (human immunodeficiency virus infection) (HCC)   . Incarceration     Past Surgical History:  Procedure Laterality Date  . FLEXOR TENDON REPAIR Right 02/27/2016   Procedure: RIGHT SMALL FINGER FLEXOR TENDON REPAIR;  Surgeon: Mack Hookavid Thompson, MD;  Location: [facility];  Service: Orthopedics;  Laterality: Right;  . I&D EXTREMITY  05/21/2012   Procedure: IRRIGATION AND DEBRIDEMENT EXTREMITY;  Surgeon: Javier DockerJeffrey C Beane, MD;  Location: [facility];  Service: Orthopedics;  Laterality: Right;    History reviewed. No pertinent family history.  Social History  Substance Use Topics  . Smoking status: Current Some Day Smoker    Packs/day: 0.50    Types: Cigarettes    Last attempt to quit: 05/02/2016  .  Smokeless tobacco: Never Used  . Alcohol use Yes     Comment: Weekends.     No current facility-administered medications for this encounter.   Current Outpatient Prescriptions:  .  darunavir-cobicistat (PREZCOBIX) 800-150 MG tablet, Take 1 tablet by mouth daily., Disp: 30 tablet, Rfl: 5 .  emtricitabine-tenofovir AF (DESCOVY) 200-25 MG tablet, Take 1 tablet by mouth daily., Disp: 30 tablet, Rfl: 5 .  mometasone-formoterol (DULERA) 100-5 MCG/ACT AERO, Inhale 2 puffs into the lungs 2 (two) times daily., Disp: 1 Inhaler, Rfl: 5 .  ibuprofen (ADVIL,MOTRIN) 600 MG tablet, Take 1 tablet (600 mg total) by mouth every 6 (six) hours as needed., Disp: 30 tablet, Rfl: 0  No Known Allergies   ROS  As noted in HPI.   Physical Exam  BP 122/80 (BP Location: Left Arm)   Pulse 90   Temp 98.6 F (37 C) (Oral)   Resp 18   SpO2 99%   Constitutional: Well developed, well nourished, no acute distress Eyes:  EOMI, conjunctiva normal bilaterally HENT: Normocephalic, atraumatic,mucus membranes moist Respiratory: Normal inspiratory effort Cardiovascular: Normal rate GI: nondistended skin: No rash, skin intact Musculoskeletal: Patient unable to flex and extend at the DIP, PIP. Positive tender swelling along the proximal phalanx / PIP with no erythema, expressible purulent drainage. Positive chronic looking scabs. 2 point discrimination intact. Pain with passive flexion and extension of the finger.     See pictures Neurologic: Alert &  oriented x 3, no focal neuro deficits Psychiatric: Speech and behavior appropriate   ED Course   Medications - No data to display  No orders of the defined types were placed in this encounter.   No results found for this or any previous visit (from the past 24 hour(s)). No results found.  ED Clinical Impression  Pain of finger of right hand  ED Assessment/Plan  [facility] narcotic database reviewed. No opiate prescriptions in the past 6  months.  Previous records reviewed. As noted in history of present illness. Discussed case with Dr. Ave Filter, Dr. Carollee Massed colleague. We will arrange an appointment for the patient see Dr. Janee Morn early next week. No antibiotics today as there is no evidence of acute infection. We'll send home with pain medicine. Patient to continue local wound care. As this is been ongoing for 1-2 months. Do not think the patient needs to be evaluated urgently.  Arranged follow-up appointment with Dr. Janee Morn on Monday 3/19 at noon at [facility] at [facility].   Discussed  plan and followup with patient. Patient  agrees with plan.   Meds ordered this encounter  Medications  . ibuprofen (ADVIL,MOTRIN) 600 MG tablet    Sig: Take 1 tablet (600 mg total) by mouth every 6 (six) hours as needed.    Dispense:  30 tablet    Refill:  0    *This clinic note was created using Scientist, clinical (histocompatibility and immunogenetics). Therefore, there may be occasional mistakes despite careful proofreading.  ?   Domenick Gong, MD 01/31/17 [phone]

## 2017-02-03 ENCOUNTER — Other Ambulatory Visit: Payer: Self-pay | Admitting: Orthopedic Surgery

## 2017-02-03 NOTE — H&P (Signed)
Curtis Hodge is an 51 y.o. male.   CC / Reason for Visit: Right small finger problem HPI: This patient is a 51 year old male with HIV & Hep C who presents for evaluation of his right small finger.  He sustained a volar laceration, and underwent repair of both flexor tendons in zone 2 with wound debridement and closure on 02-27-16.  He was lost to follow-up.  He reports that he has developed a wound on the volar aspect of it, sometimes with little blue sutures evident.  The volar surface in the midportion of the finger will intermittently drain.  It is tender, and he is concerned that it could be infected.  He never did any structured rehabilitation, and his digit is in full extension, only able to flex at the MP joint.  There is tenderness in the digit.  He was evaluated at [facility] and referred for further evaluation.  Past Medical History:  Diagnosis Date  . Atypical chest pain 08/07/2016  . Depression 08/07/2016  . Emphysema/COPD (HCC)   . Hepatitis B    HepC  . HIV (human immunodeficiency virus infection) (HCC)   . Incarceration     Past Surgical History:  Procedure Laterality Date  . FLEXOR TENDON REPAIR Right 02/27/2016   Procedure: RIGHT SMALL FINGER FLEXOR TENDON REPAIR;  Surgeon: [name], MD;  Location: [facility];  Service: Orthopedics;  Laterality: Right;  . I&D EXTREMITY  05/21/2012   Procedure: IRRIGATION AND DEBRIDEMENT EXTREMITY;  Surgeon: [name], MD;  Location: [facility] OR;  Service: Orthopedics;  Laterality: Right;    No family history on file. Social History:  reports that he has been smoking Cigarettes.  He has been smoking about 0.50 packs per day. He has never used smokeless tobacco. He reports that he drinks alcohol. He reports that he does not use drugs.  Allergies: No Known Allergies  No prescriptions prior to admission.    No results found for this or any previous visit (from the past 48 hour(s)). No results found.  Review of Systems   All other systems reviewed and are negative.   There were no vitals taken for this visit. Physical Exam  Constitutional:  WD, WN, NAD HEENT:  NCAT, EOMI Neuro/Psych:  Alert & oriented to person, place, and time; appropriate mood & affect Lymphatic: No generalized UE edema or lymphadenopathy Extremities / MSK:  Both UE are normal with respect to appearance, ranges of motion, joint stability, muscle strength/tone, sensation, & perfusion except as otherwise noted:  Right small finger has a healed Bruner incision.  The midportion has an open wound with dry flaky margins, no real expressible purulence, but tenderness and enlargement about it.  There is no discernible flexor tendon function in the digit, intact light touch sensibility on the radial and ulnar aspects of it, and active flexion at the MP.  Labs / Xrays:  No radiographic studies obtained today.  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  I indicated he could continue observation as I did not think it opposed any serious threat to him from the standpoint of systemic infection, etc.  Alternatively, we could operate to debridement the affected area and attempt to gain primary closure, or consider ablation of the digit given its underlying functional inadequacies.  After some deliberation and consideration, he indicated that the digit is really not helpful in his daily function, and in fact often  gets in the way.  He indicated he would like to proceed with amputation, and we discussed the differences between amputation at the base of the digit versus a ray resection and ultimately decided to proceed with ray resection.  He would like to do this tomorrow, and we will plan to proceed accordingly.  The details of the operative procedure were discussed with the patient.  Questions were invited and answered.  In addition to the goal of the procedure, the  risks of the procedure to include but not limited to bleeding; infection; damage to the nerves or blood vessels that could result in bleeding, numbness, weakness, chronic pain, and the need for additional procedures; stiffness; the need for revision surgery; and anesthetic risks were reviewed.  No specific outcome was guaranteed or implied.  Informed consent was obtained.  Nalanie Winiecki A., MD 02/03/2017, 4:53 PM

## 2017-02-04 ENCOUNTER — Ambulatory Visit (HOSPITAL_BASED_OUTPATIENT_CLINIC_OR_DEPARTMENT_OTHER)
Admission: RE | Admit: 2017-02-04 | Discharge: 2017-02-04 | Disposition: A | Discharge status: Home / Self Care | Payer: Medicaid Other | Source: Ambulatory Visit | Attending: Orthopedic Surgery | Admitting: Orthopedic Surgery

## 2017-02-04 ENCOUNTER — Encounter (HOSPITAL_BASED_OUTPATIENT_CLINIC_OR_DEPARTMENT_OTHER): Payer: Self-pay | Admitting: *Deleted

## 2017-02-04 ENCOUNTER — Ambulatory Visit (HOSPITAL_BASED_OUTPATIENT_CLINIC_OR_DEPARTMENT_OTHER): Payer: Medicaid Other | Admitting: Anesthesiology

## 2017-02-04 ENCOUNTER — Encounter (HOSPITAL_BASED_OUTPATIENT_CLINIC_OR_DEPARTMENT_OTHER)
Admission: RE | Disposition: A | Discharge status: Home / Self Care | Payer: Self-pay | Source: Ambulatory Visit | Attending: Orthopedic Surgery

## 2017-02-04 DIAGNOSIS — T814XXA Infection following a procedure, initial encounter: Secondary | ICD-10-CM | POA: Diagnosis not present

## 2017-02-04 DIAGNOSIS — L02511 Cutaneous abscess of right hand: Secondary | ICD-10-CM | POA: Insufficient documentation

## 2017-02-04 DIAGNOSIS — T8189XA Other complications of procedures, not elsewhere classified, initial encounter: Secondary | ICD-10-CM | POA: Diagnosis present

## 2017-02-04 DIAGNOSIS — Y838 Other surgical procedures as the cause of abnormal reaction of the patient, or of later complication, without mention of misadventure at the time of the procedure: Secondary | ICD-10-CM | POA: Diagnosis not present

## 2017-02-04 DIAGNOSIS — F17210 Nicotine dependence, cigarettes, uncomplicated: Secondary | ICD-10-CM | POA: Diagnosis not present

## 2017-02-04 DIAGNOSIS — B1920 Unspecified viral hepatitis C without hepatic coma: Secondary | ICD-10-CM | POA: Insufficient documentation

## 2017-02-04 DIAGNOSIS — F329 Major depressive disorder, single episode, unspecified: Secondary | ICD-10-CM | POA: Diagnosis not present

## 2017-02-04 DIAGNOSIS — Z21 Asymptomatic human immunodeficiency virus [HIV] infection status: Secondary | ICD-10-CM | POA: Diagnosis not present

## 2017-02-04 DIAGNOSIS — J449 Chronic obstructive pulmonary disease, unspecified: Secondary | ICD-10-CM | POA: Diagnosis not present

## 2017-02-04 DIAGNOSIS — J439 Emphysema, unspecified: Secondary | ICD-10-CM | POA: Diagnosis not present

## 2017-02-04 HISTORY — PX: AMPUTATION: SHX166

## 2017-02-04 SURGERY — AMPUTATION, FOOT, RAY
Anesthesia: Monitor Anesthesia Care | Site: Finger | Laterality: Right

## 2017-02-04 MED ORDER — CEFAZOLIN SODIUM-DEXTROSE 2-4 GM/100ML-% IV SOLN
INTRAVENOUS | Status: AC
  Filled 2017-02-04: qty 100

## 2017-02-04 MED ORDER — ONDANSETRON HCL 4 MG/2ML IJ SOLN
INTRAMUSCULAR | Status: DC | PRN
  Administered 2017-02-04: 4 mg via INTRAVENOUS

## 2017-02-04 MED ORDER — FENTANYL CITRATE (PF) 100 MCG/2ML IJ SOLN
50.0000 ug | INTRAMUSCULAR | Status: DC | PRN
  Administered 2017-02-04 (×2): 50 ug via INTRAVENOUS

## 2017-02-04 MED ORDER — HYDROMORPHONE HCL 1 MG/ML IJ SOLN: 0.2500 mg | INTRAMUSCULAR | Status: DC | PRN

## 2017-02-04 MED ORDER — LACTATED RINGERS IV SOLN
INTRAVENOUS | Status: DC
Start: 2017-02-04 — End: 2017-02-04
  Administered 2017-02-04: 12:00:00 via INTRAVENOUS

## 2017-02-04 MED ORDER — CEPHALEXIN 500 MG PO CAPS: 500.0000 mg | ORAL_CAPSULE | Freq: Four times a day (QID) | ORAL | 0 refills | Status: AC

## 2017-02-04 MED ORDER — MIDAZOLAM HCL 2 MG/2ML IJ SOLN
1.0000 mg | INTRAMUSCULAR | Status: DC | PRN
  Administered 2017-02-04 (×2): 1 mg via INTRAVENOUS

## 2017-02-04 MED ORDER — LACTATED RINGERS IV SOLN
INTRAVENOUS | Status: DC
  Administered 2017-02-04 (×2): via INTRAVENOUS

## 2017-02-04 MED ORDER — LIDOCAINE-EPINEPHRINE (PF) 1.5 %-1:200000 IJ SOLN
INTRAMUSCULAR | Status: DC | PRN
  Administered 2017-02-04: 25 mL via PERINEURAL

## 2017-02-04 MED ORDER — BUPIVACAINE-EPINEPHRINE (PF) 0.5% -1:200000 IJ SOLN
INTRAMUSCULAR | Status: DC | PRN
  Administered 2017-02-04: 30 mL via PERINEURAL

## 2017-02-04 MED ORDER — BUPIVACAINE HCL (PF) 0.5 % IJ SOLN
INTRAMUSCULAR | Status: AC
  Filled 2017-02-04: qty 30

## 2017-02-04 MED ORDER — PROMETHAZINE HCL 25 MG/ML IJ SOLN
6.2500 mg | INTRAMUSCULAR | Status: DC | PRN
Start: 2017-02-04 — End: 2017-02-04

## 2017-02-04 MED ORDER — CHLORHEXIDINE GLUCONATE 4 % EX LIQD: 60.0000 mL | Freq: Once | CUTANEOUS | Status: DC

## 2017-02-04 MED ORDER — PROPOFOL 10 MG/ML IV BOLUS
INTRAVENOUS | Status: DC | PRN
  Administered 2017-02-04 (×6): 50 mg via INTRAVENOUS
  Administered 2017-02-04: 30 mg via INTRAVENOUS

## 2017-02-04 MED ORDER — LIDOCAINE HCL (CARDIAC) 20 MG/ML IV SOLN
INTRAVENOUS | Status: DC | PRN
  Administered 2017-02-04: 50 mg via INTRAVENOUS

## 2017-02-04 MED ORDER — PROPOFOL 10 MG/ML IV BOLUS
INTRAVENOUS | Status: AC
  Filled 2017-02-04: qty 20

## 2017-02-04 MED ORDER — OXYCODONE HCL 5 MG PO TABS: 5.0000 mg | ORAL_TABLET | Freq: Four times a day (QID) | ORAL | 0 refills | Status: DC | PRN

## 2017-02-04 MED ORDER — SCOPOLAMINE 1 MG/3DAYS TD PT72: 1.0000 | MEDICATED_PATCH | Freq: Once | TRANSDERMAL | Status: DC | PRN

## 2017-02-04 MED ORDER — ACETAMINOPHEN 325 MG PO TABS: 650.0000 mg | ORAL_TABLET | Freq: Four times a day (QID) | ORAL | Status: DC | PRN

## 2017-02-04 MED ORDER — CEFAZOLIN SODIUM-DEXTROSE 2-4 GM/100ML-% IV SOLN
2.0000 g | INTRAVENOUS | Status: AC
  Administered 2017-02-04: 2 g via INTRAVENOUS

## 2017-02-04 MED ORDER — MIDAZOLAM HCL 2 MG/2ML IJ SOLN
INTRAMUSCULAR | Status: AC
  Filled 2017-02-04: qty 2

## 2017-02-04 MED ORDER — FENTANYL CITRATE (PF) 100 MCG/2ML IJ SOLN
INTRAMUSCULAR | Status: AC
  Filled 2017-02-04: qty 2

## 2017-02-04 MED ORDER — LIDOCAINE 2% (20 MG/ML) 5 ML SYRINGE
INTRAMUSCULAR | Status: AC
  Filled 2017-02-04: qty 5

## 2017-02-04 SURGICAL SUPPLY — 50 items
BANDAGE COBAN STERILE 2 (GAUZE/BANDAGES/DRESSINGS) IMPLANT
BLADE AVERAGE 25MMX9MM (BLADE) ×1
BLADE AVERAGE 25X9 (BLADE) ×1 IMPLANT
BLADE MINI RND TIP GREEN BEAV (BLADE) IMPLANT
BLADE OSC/SAG .038X5.5 CUT EDG (BLADE) IMPLANT
BLADE SURG 15 STRL LF DISP TIS (BLADE) ×1 IMPLANT
BLADE SURG 15 STRL SS (BLADE) ×3
BNDG CMPR 9X4 STRL LF SNTH (GAUZE/BANDAGES/DRESSINGS) ×1
BNDG COHESIVE 4X5 TAN STRL (GAUZE/BANDAGES/DRESSINGS) ×3 IMPLANT
BNDG ESMARK 4X9 LF (GAUZE/BANDAGES/DRESSINGS) ×2 IMPLANT
BNDG GAUZE 1X2.1 STRL (MISCELLANEOUS) IMPLANT
BNDG GAUZE ELAST 4 BULKY (GAUZE/BANDAGES/DRESSINGS) ×3 IMPLANT
CHLORAPREP W/TINT 26ML (MISCELLANEOUS) ×3 IMPLANT
CORDS BIPOLAR (ELECTRODE) ×2 IMPLANT
COVER BACK TABLE 60X90IN (DRAPES) ×3 IMPLANT
COVER MAYO STAND STRL (DRAPES) ×3 IMPLANT
CUFF TOURNIQUET SINGLE 18IN (TOURNIQUET CUFF) IMPLANT
DRAIN PENROSE 1/2X12 LTX STRL (WOUND CARE) IMPLANT
DRAPE EXTREMITY T 121X128X90 (DRAPE) ×3 IMPLANT
DRAPE SURG 17X23 STRL (DRAPES) ×3 IMPLANT
DRSG EMULSION OIL 3X3 NADH (GAUZE/BANDAGES/DRESSINGS) ×3 IMPLANT
GAUZE SPONGE 4X4 12PLY STRL LF (GAUZE/BANDAGES/DRESSINGS) ×3 IMPLANT
GLOVE BIO SURGEON STRL SZ 6.5 (GLOVE) ×1 IMPLANT
GLOVE BIO SURGEON STRL SZ7.5 (GLOVE) ×3 IMPLANT
GLOVE BIO SURGEONS STRL SZ 6.5 (GLOVE) ×1
GLOVE BIOGEL PI IND STRL 7.0 (GLOVE) ×1 IMPLANT
GLOVE BIOGEL PI IND STRL 8 (GLOVE) ×1 IMPLANT
GLOVE BIOGEL PI INDICATOR 7.0 (GLOVE) ×8
GLOVE BIOGEL PI INDICATOR 8 (GLOVE) ×2
GLOVE ECLIPSE 6.5 STRL STRAW (GLOVE) ×3 IMPLANT
GOWN STRL REUS W/ TWL LRG LVL3 (GOWN DISPOSABLE) ×2 IMPLANT
GOWN STRL REUS W/TWL LRG LVL3 (GOWN DISPOSABLE) ×6
GOWN STRL REUS W/TWL XL LVL3 (GOWN DISPOSABLE) ×3 IMPLANT
NDL HYPO 25X1 1.5 SAFETY (NEEDLE) IMPLANT
NEEDLE HYPO 25X1 1.5 SAFETY (NEEDLE) IMPLANT
NS IRRIG 1000ML POUR BTL (IV SOLUTION) ×3 IMPLANT
PACK BASIN DAY SURGERY FS (CUSTOM PROCEDURE TRAY) ×3 IMPLANT
PADDING CAST ABS 4INX4YD NS (CAST SUPPLIES)
PADDING CAST ABS COTTON 4X4 ST (CAST SUPPLIES) IMPLANT
RUBBERBAND STERILE (MISCELLANEOUS) IMPLANT
SLEEVE SCD COMPRESS KNEE MED (MISCELLANEOUS) IMPLANT
STOCKINETTE 6  STRL (DRAPES) ×2
STOCKINETTE 6 STRL (DRAPES) ×1 IMPLANT
SUT VICRYL RAPIDE 4-0 (SUTURE) IMPLANT
SUT VICRYL RAPIDE 4/0 PS 2 (SUTURE) IMPLANT
SYR 10ML LL (SYRINGE) IMPLANT
SYR BULB 3OZ (MISCELLANEOUS) ×3 IMPLANT
TOWEL OR 17X24 6PK STRL BLUE (TOWEL DISPOSABLE) ×3 IMPLANT
TOWEL OR NON WOVEN STRL DISP B (DISPOSABLE) ×3 IMPLANT
UNDERPAD 30X30 (UNDERPADS AND DIAPERS) ×3 IMPLANT

## 2017-02-04 NOTE — Interval H&P Note (Signed)
History and Physical Interval Note:  02/04/2017 1:19 PM  Curtis Hodge  has presented today for surgery, with the diagnosis of RIGHT SMALL FINGER STIFFNESS  The various methods of treatment have been discussed with the patient and family. After consideration of risks, benefits and other options for treatment, the patient has consented to  Procedure(s): RIGHT SMALL FINGER RAY AMPUTATION (Right) as a surgical intervention .  The patient's history has been reviewed, patient examined, no change in status, stable for surgery.  I have reviewed the patient's chart and labs.  Questions were answered to the patient's satisfaction.     Haidar Muse A.

## 2017-02-04 NOTE — Anesthesia Preprocedure Evaluation (Addendum)
Anesthesia Evaluation  Patient identified by MRN, date of birth, ID band Patient awake    Reviewed: Allergy & Precautions, NPO status , Patient's Chart, lab work & pertinent test results  Airway Mallampati: II  TM Distance: >3 FB Neck ROM: Full    Dental   Pulmonary COPD, Current Smoker,    breath sounds clear to auscultation       Cardiovascular negative cardio ROS   Rhythm:Regular Rate:Normal     Neuro/Psych    GI/Hepatic negative GI ROS, (+) Hepatitis -, B  Endo/Other  negative endocrine ROS  Renal/GU negative Renal ROS     Musculoskeletal   Abdominal   Peds  Hematology  (+) HIV,   Anesthesia Other Findings   Reproductive/Obstetrics                            Anesthesia Physical  Anesthesia Plan  ASA: III  Anesthesia Plan: General   Post-op Pain Management:    Induction: Intravenous  Airway Management Planned: LMA  Additional Equipment:   Intra-op Plan:   Post-operative Plan: Extubation in OR  Informed Consent: I have reviewed the patients History and Physical, chart, labs and discussed the procedure including the risks, benefits and alternatives for the proposed anesthesia with the patient or authorized representative who has indicated his/her understanding and acceptance.   Dental advisory given  Plan Discussed with: CRNA and Anesthesiologist  Anesthesia Plan Comments:         Anesthesia Quick Evaluation

## 2017-02-04 NOTE — Anesthesia Postprocedure Evaluation (Signed)
Anesthesia Post Note  Patient: Curtis Hodge  Procedure(s) Performed: Procedure(s) (LRB): RIGHT SMALL FINGER RAY AMPUTATION (Right)  Patient location during evaluation: PACU Anesthesia Type: Regional and MAC Level of consciousness: awake and alert, patient cooperative and oriented Pain management: pain level controlled Vital Signs Assessment: post-procedure vital signs reviewed and stable Respiratory status: spontaneous breathing, nonlabored ventilation and respiratory function stable Cardiovascular status: blood pressure returned to baseline and stable Postop Assessment: no signs of nausea or vomiting Anesthetic complications: no       Last Vitals:  Vitals:   02/04/17 1445 02/04/17 1530  BP: 125/87 (!) 147/91  Pulse: 76 65  Resp: 11 14  Temp:  36.6 C    Last Pain:  Vitals:   02/04/17 1530  TempSrc: Oral  PainSc: 0-No pain                 Kealie Barrie,E. Cace Osorto

## 2017-02-04 NOTE — Discharge Instructions (Signed)
Discharge Instructions   Move your fingers as much as possible, making a full fist and fully opening the fist. Elevate your hand to reduce pain & swelling of the digits.  Ice over the operative site may be helpful to reduce pain & swelling.  DO NOT USE HEAT. Pain medicine has been prescribed for you.  Take tylenol and Ibuprofen over the counter as prescribed every 6 hours. Add Oxycodone 5 mg if you have severe pain. Leave the dressing in place until you return to our office.  You may shower, but keep the bandage clean & dry.  You may drive a car when you are off of prescription pain medications and can safely control your vehicle with both hands. Call our office to arrange a follow up appointment in 10-15 days from the date of surgery.   Please call [phone] during normal business hours or [phone] after hours for any problems. Including the following:  - excessive redness of the incisions - drainage for more than 4 days - fever of more than 101.5 F  *Please note that pain medications will not be refilled after hours or on weekends.    Regional Anesthesia Blocks  1. Numbness or the inability to move the "blocked" extremity may last from 3-48 hours after placement. The length of time depends on the medication injected and your individual response to the medication. If the numbness is not going away after 48 hours, call your surgeon.  2. The extremity that is blocked will need to be protected until the numbness is gone and the  Strength has returned. Because you cannot feel it, you will need to take extra care to avoid injury. Because it may be weak, you may have difficulty moving it or using it. You may not know what position it is in without looking at it while the block is in effect.  3. For blocks in the legs and feet, returning to weight bearing and walking needs to be done carefully. You will need to wait until the numbness is entirely gone and the strength has returned. You  should be able to move your leg and foot normally before you try and bear weight or walk. You will need someone to be with you when you first try to ensure you do not fall and possibly risk injury.  4. Bruising and tenderness at the needle site are common side effects and will resolve in a few days.  5. Persistent numbness or new problems with movement should be communicated to the surgeon or the [facility] [place] [phone])/ [facility] [place] [place] [phone]).       Post Anesthesia Home Care Instructions  Activity: Get plenty of rest for the remainder of the day. A responsible individual must stay with you for 24 hours following the procedure.  For the next 24 hours, DO NOT: -Drive a car -Advertising copywriterperate machinery -Drink alcoholic beverages -Take any medication unless instructed by your physician -Make any legal decisions or sign important papers.  Meals: Start with liquid foods such as gelatin or soup. Progress to regular foods as tolerated. Avoid greasy, spicy, heavy foods. If nausea and/or vomiting occur, drink only clear liquids until the nausea and/or vomiting subsides. Call your physician if vomiting continues.  Special Instructions/Symptoms: Your throat may feel dry or sore from the anesthesia or the breathing tube placed in your throat during surgery. If this causes discomfort, gargle with warm salt water. The discomfort should disappear within 24 hours.  If you had a  scopolamine patch placed behind your ear for the management of post- operative nausea and/or vomiting:  1. The medication in the patch is effective for 72 hours, after which it should be removed.  Wrap patch in a tissue and discard in the trash. Wash hands thoroughly with soap and water. 2. You may remove the patch earlier than 72 hours if you experience unpleasant side effects which may include dry mouth, dizziness or visual disturbances. 3. Avoid touching the patch. Wash your hands with soap and water  after contact with the patch.

## 2017-02-04 NOTE — Transfer of Care (Signed)
Immediate Anesthesia Transfer of Care Note  Patient: Curtis Hodge  Procedure(s) Performed: Procedure(s): RIGHT SMALL FINGER RAY AMPUTATION (Right)  Patient Location: PACU  Anesthesia Type:MAC and Regional  Level of Consciousness: sedated, patient cooperative and responds to stimulation  Airway & Oxygen Therapy: Patient Spontanous Breathing and Patient connected to nasal cannula oxygen  Post-op Assessment: Report given to RN and Post -op Vital signs reviewed and stable  Post vital signs: Reviewed and stable  Last Vitals:  Vitals:   02/04/17 1445 02/04/17 1530  BP: 125/87 (!) 147/91  Pulse: 76 65  Resp: 11 14  Temp:  36.6 C    Last Pain:  Vitals:   02/04/17 1530  TempSrc: Oral  PainSc: 0-No pain      Patients Stated Pain Goal: 2 (51/70/01 7494)  Complications: No apparent anesthesia complications

## 2017-02-04 NOTE — Progress Notes (Signed)
Assisted Dr. Jairo Benarswell Jackson with right, axillary block. Side rails up, monitors on throughout procedure. See vital signs in flow sheet. Tolerated Procedure well.

## 2017-02-04 NOTE — Anesthesia Procedure Notes (Signed)
Anesthesia Regional Block: Axillary brachial plexus block   Pre-Anesthetic Checklist: ,, timeout performed, Correct Patient, Correct Site, Correct Laterality, Correct Procedure, Correct Position, site marked, Risks and benefits discussed,  Surgical consent,  Pre-op evaluation,  At surgeon's request and post-op pain management  Laterality: Right and Upper  Prep: chloraprep       Needles:  Injection technique: Single-shot  Needle Type: Other   (short "B" bevel)   Needle Length: 4cm  Needle Gauge: 22     Additional Needles:   Procedures:,,, paresthesia technique,,,,,   Nerve Stimulator or Paresthesia:  Response: transient median nerve ,  Response: transient radial nerve,   Additional Responses:   Narrative:  Start time: 02/04/2017 12:44 PM End time: 02/04/2017 12:51 PM Injection made incrementally with aspirations every 5 mL.  Performed by: Personally  Anesthesiologist: Jean RosenthalJACKSON, Tauno Falotico  Additional Notes: Pt identified in Holding room.  Monitors applied. Working IV access confirmed. Sterile prep R axilla.  #22ga short "B" bevel to transient median and radial nerve paresthesiae.  Total 30cc 0.5% Bupivacaine with 1:200k epi, 25cc 1.5% lidocaine with 1:200k epi injected around each paresthesia, after neg test doses. Additional 5cc 1.5% lido with 1:200k epi subq intercostobrachial skin wheal. Patient asymptomatic, VSS, no heme aspirated, tolerated well.  Sandford Craze Nariya Neumeyer, MD

## 2017-02-04 NOTE — Op Note (Signed)
02/04/2017  2:33 PM  PATIENT:  Curtis Hodge  51 y.o. male  PRE-OPERATIVE DIAGNOSIS:  R SF chronic wound and stiffness s/p injury/surgery  POST-OPERATIVE DIAGNOSIS:  Same  PROCEDURE:  Right hand fifth ray resection  SURGEON: Rayvon Char. Grandville Silos, MD  PHYSICIAN ASSISTANT: Morley Kos, OPA-C  ANESTHESIA:  regional and MAC  SPECIMENS:  None  DRAINS:   None  EBL:  less than 50 mL  PREOPERATIVE INDICATIONS:  Curtis Hodge is a  51 y.o. male with right small finger stiffness in extension, no active flexion of PIP/DIP and chronic volar wound with pain.  The risks benefits and alternatives were discussed with the patient preoperatively including but not limited to the risks of infection, bleeding, nerve injury, cardiopulmonary complications, the need for revision surgery, among others, and the patient verbalized understanding and consented to proceed.  OPERATIVE IMPLANTS: None  OPERATIVE PROCEDURE:  After receiving prophylactic antibiotics and a regional block, the patient was escorted to the operative theatre and placed in a supine position.  A surgical "time-out" was performed during which the planned procedure, proposed operative site, and the correct patient identity were compared to the operative consent and agreement confirmed by the circulating nurse according to current facility policy.  Following application of a tourniquet to the operative extremity, the exposed skin was prepped with Chloraprep and draped in the usual sterile fashion.  The limb was exsanguinated with an Esmarch bandage and the tourniquet inflated to approximately 175mHg higher than systolic BP.  The incision was marked and made sharply with scalpel, coursing around the base of the small finger and then linear longitudinal incision along the dorsum of the fifth metacarpal.  Dorsally the extensor tendons were divided proximally and reflected.  Subperiosteal dissection was carried around the fifth metacarpal to  expose the base, and using an oscillating saw, it was transected obliquely at the metaphyseal flare.  The bone was then reflected distally, dividing from its attachment the hyperthenar and interosseous muscles, the juncturae from the ring finger extensor, and the intermetacarpal ligament.  The digital arteries had ligatures placed around them, as did the digital nerves.  Distally.  The flexor tendon sheath was entered of the flexor tendons divided and allowed to retract.  The amputated specimen was then removed.  The digital nerves were then placed into the hyperthenar muscles at the level of the fourth metacarpal neck, where they seemed to lie in a recessed area to avoid direct pressure on them.  They were brought more dorsally so that it would not be in the volar surface.  Tourniquet was released, some additional hemostasis obtained and the wound is copiously irrigated.  Hyperthenar muscles were just reapproximated by closing the periosteum of the metacarpal.  The wound was then closed with 4-0 Vicryl Rapide interrupted sutures and a light dressing applied.  He was taken to the recovery room in stable condition.   DISPOSITION:  He will be discharged home today with typical instructions, returning in 10-15 days.

## 2017-02-05 ENCOUNTER — Encounter (HOSPITAL_BASED_OUTPATIENT_CLINIC_OR_DEPARTMENT_OTHER): Payer: Self-pay | Admitting: Orthopedic Surgery

## 2017-02-05 NOTE — Addendum Note (Signed)
Addendum  created 02/05/17 1132 by Lance CoonWesley Aurthur Wingerter, CRNA   Charge Capture section accepted

## 2017-02-23 ENCOUNTER — Emergency Department (HOSPITAL_COMMUNITY)
Admission: EM | Admit: 2017-02-23 | Discharge: 2017-02-23 | Disposition: A | Discharge status: Home / Self Care | Payer: Self-pay | Attending: Emergency Medicine | Admitting: Emergency Medicine

## 2017-02-23 ENCOUNTER — Emergency Department (HOSPITAL_COMMUNITY): Payer: Self-pay

## 2017-02-23 ENCOUNTER — Encounter (HOSPITAL_COMMUNITY): Payer: Self-pay

## 2017-02-23 DIAGNOSIS — R091 Pleurisy: Secondary | ICD-10-CM | POA: Insufficient documentation

## 2017-02-23 DIAGNOSIS — Z79899 Other long term (current) drug therapy: Secondary | ICD-10-CM | POA: Insufficient documentation

## 2017-02-23 DIAGNOSIS — F17210 Nicotine dependence, cigarettes, uncomplicated: Secondary | ICD-10-CM | POA: Insufficient documentation

## 2017-02-23 DIAGNOSIS — J449 Chronic obstructive pulmonary disease, unspecified: Secondary | ICD-10-CM | POA: Insufficient documentation

## 2017-02-23 LAB — CBC
HEMATOCRIT: 35.2 % — AB (ref 39.0–52.0)
Hemoglobin: 12.3 g/dL — ABNORMAL LOW (ref 13.0–17.0)
MCH: 33.2 pg (ref 26.0–34.0)
MCHC: 34.9 g/dL (ref 30.0–36.0)
MCV: 94.9 fL (ref 78.0–100.0)
PLATELETS: 156 10*3/uL (ref 150–400)
RBC: 3.71 MIL/uL — ABNORMAL LOW (ref 4.22–5.81)
RDW: 12.3 % (ref 11.5–15.5)
WBC: 4.7 10*3/uL (ref 4.0–10.5)

## 2017-02-23 LAB — BASIC METABOLIC PANEL
Anion gap: 9 (ref 5–15)
BUN: 7 mg/dL (ref 6–20)
CHLORIDE: 103 mmol/L (ref 101–111)
CO2: 25 mmol/L (ref 22–32)
CREATININE: 1.01 mg/dL (ref 0.61–1.24)
Calcium: 8.8 mg/dL — ABNORMAL LOW (ref 8.9–10.3)
GFR calc Af Amer: >60 mL/min (ref >60)
GFR calc non Af Amer: >60 mL/min (ref >60)
GLUCOSE: 83 mg/dL (ref 65–99)
POTASSIUM: 3.9 mmol/L (ref 3.5–5.1)
SODIUM: 137 mmol/L (ref 135–145)

## 2017-02-23 LAB — I-STAT TROPONIN, ED: Troponin i, poc: 0 ng/mL (ref 0.00–0.08)

## 2017-02-23 MED ORDER — IOPAMIDOL (ISOVUE-370) INJECTION 76%
INTRAVENOUS | Status: AC
  Administered 2017-02-23: 100 mL
  Filled 2017-02-23: qty 100

## 2017-02-23 MED ORDER — KETOROLAC TROMETHAMINE 30 MG/ML IJ SOLN
30.0000 mg | Freq: Once | INTRAMUSCULAR | Status: AC
  Administered 2017-02-23: 30 mg via INTRAVENOUS
  Filled 2017-02-23: qty 1

## 2017-02-23 MED ORDER — IBUPROFEN 800 MG PO TABS: 800.0000 mg | ORAL_TABLET | Freq: Three times a day (TID) | ORAL | 0 refills | Status: DC

## 2017-02-23 NOTE — ED Notes (Signed)
Pt with recent R 5th finger amputation. Pt states removed d/t infection.

## 2017-02-23 NOTE — ED Notes (Signed)
Patient transported to CT 

## 2017-02-23 NOTE — ED Triage Notes (Signed)
Pt complaining of L sided chest pain x 2 days. Pt states 4 episodes of emesis today. Pt also complaining of bilateral hand pain.

## 2017-02-23 NOTE — ED Notes (Signed)
Patient transported to X-ray 

## 2017-02-23 NOTE — ED Provider Notes (Signed)
[facility]-EMER[facility]Y DEPT Provider Note   CSN: [id number] Arrival date & time: 02/23/17  0012  By signing my name below, I, Curtis Hodge, attest that this documentation has been prepared under the direction and in the presence of Curtis Morss, MD. Electronically Signed: Elder Hodge, Scribe. 02/23/17. 1:16 AM.   History   Chief Complaint Chief Complaint  Patient presents with  . Chest Pain  . Emesis    HPI Curtis Hodge is a 51 y.o. male with history of COPD, Hep B, and HIV last CD4 310 on 07/25/2016 who presents to the ED for evaluation of chest pain and dyspnea. This patient states that several hours ago he developed chest pain which is worse when breathing deeply. He has also experienced a cough over the last 2 days. He also describes vague, constant "squeezing" from "his head to his abdomen". No recent long distance travel. No peripheral edema.   The history is provided by the patient. No language interpreter was used.  Chest Pain   This is a new problem. The current episode started 3 to 5 hours ago. The problem occurs constantly. The problem has not changed since onset.The pain is associated with breathing. The pain is present in the substernal region. The pain is moderate. The quality of the pain is described as pleuritic. The pain does not radiate. The symptoms are aggravated by deep breathing. Associated symptoms include cough and shortness of breath. Pertinent negatives include no abdominal pain, no back pain, no fever, no palpitations and no vomiting. Risk factors include male gender.  Pertinent negatives for past medical history include no aneurysm and no seizures.  Pertinent negatives for family medical history include: no early MI.  Procedure history is negative for stress echo.    Past Medical History:  Diagnosis Date  . Atypical chest pain 08/07/2016  . Depression 08/07/2016  . Emphysema/COPD (HCC)   . Hepatitis B    HepC  . HIV (human immunodeficiency virus  infection) (HCC)   . Incarceration     Patient Active Problem List   Diagnosis Date Noted  . Atypical chest pain 08/07/2016  . Depression 08/07/2016  . HIV disease (HCC) 02/09/2015  . Bullous emphysema (HCC) 02/09/2015  . Musculoskeletal chest pain 02/09/2015  . Falling asleep and waking up too early in the evening 04/26/2014  . Sore throat 04/26/2014  . Sciatic pain 08/30/2013  . Low testosterone 08/30/2013  . Hemorrhoid 08/30/2013  . Blurry vision, bilateral 01/05/2013  . Lumbago 01/05/2013  . Weight loss 07/22/2012  . Face lacerations 07/22/2012  . Incarceration   . Chronic hepatitis B without delta agent without hepatic coma (HCC) 07/11/2010  . CANNABIS ABUSE, EPISODIC 07/11/2010  . SMOKER 07/11/2010  . HIV INFECTION 06/26/2010    Past Surgical History:  Procedure Laterality Date  . AMPUTATION Right 02/04/2017   Procedure: RIGHT SMALL FINGER RAY AMPUTATION;  Surgeon: [name], MD;  Location: [facility];  Service: Orthopedics;  Laterality: Right;  . FLEXOR TENDON REPAIR Right 02/27/2016   Procedure: RIGHT SMALL FINGER FLEXOR TENDON REPAIR;  Surgeon: [name], MD;  Location: [facility];  Service: Orthopedics;  Laterality: Right;  . I&D EXTREMITY  05/21/2012   Procedure: IRRIGATION AND DEBRIDEMENT EXTREMITY;  Surgeon: [name], MD;  Location: [facility];  Service: Orthopedics;  Laterality: Right;       Home Medications    Prior to Admission medications   Medication Sig Start Date End Date Taking? Authorizing Provider  acetaminophen (TYLENOL) 325  MG tablet Take 2 tablets (650 mg total) by mouth every 6 (six) hours as needed for mild pain or moderate pain. 02/04/17   [name], MD  darunavir-cobicistat (PREZCOBIX) 800-150 MG tablet Take 1 tablet by mouth daily. 08/21/16   Randall Hiss, MD  emtricitabine-tenofovir AF (DESCOVY) 200-25 MG tablet Take 1 tablet by mouth daily. 08/21/16   Randall Hiss, MD  ibuprofen  (ADVIL,MOTRIN) 600 MG tablet Take 1 tablet (600 mg total) by mouth every 6 (six) hours as needed. 01/31/17   Domenick Gong, MD  mometasone-formoterol [facility]) 100-5 MCG/ACT AERO Inhale 2 puffs into the lungs 2 (two) times daily. 10/07/16   Randall Hiss, MD  oxyCODONE (ROXICODONE) 5 MG immediate release tablet Take 1 tablet (5 mg total) by mouth every 6 (six) hours as needed for severe pain. 02/04/17   [name], MD    Family History History reviewed. No pertinent family history.  Social History Social History  Substance Use Topics  . Smoking status: Current Every Day Smoker    Packs/day: 0.50    Types: Cigarettes    Last attempt to quit: 05/02/2016  . Smokeless tobacco: Never Used  . Alcohol use Yes     Comment: Weekends.      Allergies   Patient has no known allergies.   Review of Systems Review of Systems  Constitutional: Negative for chills and fever.  HENT: Negative for ear pain and sore throat.   Eyes: Negative for pain and visual disturbance.  Respiratory: Positive for cough and shortness of breath.   Cardiovascular: Positive for chest pain. Negative for palpitations and leg swelling.  Gastrointestinal: Negative for abdominal pain and vomiting.  Genitourinary: Negative for dysuria and hematuria.  Musculoskeletal: Negative for arthralgias and back pain.  Skin: Negative for color change and rash.  Neurological: Negative for seizures and syncope.  All other systems reviewed and are negative.    Physical Exam Updated Vital Signs BP 134/78 (BP Location: Right Arm)   Pulse 89   Temp 97.7 F (36.5 C) (Oral)   Resp 16   SpO2 100%   Physical Exam  Constitutional: He is oriented to person, place, and time. He appears well-developed and well-nourished.  HENT:  Head: Normocephalic and atraumatic.  Mouth/Throat: Oropharynx is clear and moist. No oropharyngeal exudate.  Eyes: Conjunctivae and EOM are normal. Pupils are equal, round, and reactive to light.    Neck: Normal range of motion. Neck supple. No JVD present.  Cardiovascular: Normal rate, regular rhythm, normal heart sounds and intact distal pulses.   Pulmonary/Chest: Effort normal and breath sounds normal. No stridor. No respiratory distress. He has no wheezes. He has no rales. He exhibits no tenderness.  Abdominal: Soft. Bowel sounds are normal. He exhibits no distension. There is no tenderness. There is no rebound and no guarding.  Musculoskeletal: Normal range of motion. He exhibits no edema or tenderness.  Neurological: He is alert and oriented to person, place, and time. He displays normal reflexes.  Skin: Skin is warm and dry.  Psychiatric: He has a normal mood and affect.  Nursing note and vitals reviewed.    ED Treatments / Results   Vitals:   02/23/17 0115 02/23/17 0130  BP: 121/85 121/75  Pulse: 67 77  Resp: 13 15  Temp:      Labs (all labs ordered are listed, but only abnormal results are displayed)  Results for orders placed or performed during the hospital encounter of 02/23/17  Basic metabolic  panel  Result Value Ref Range   Sodium 137 135 - 145 mmol/L   Potassium 3.9 3.5 - 5.1 mmol/L   Chloride 103 101 - 111 mmol/L   CO2 25 22 - 32 mmol/L   Glucose, Bld 83 65 - 99 mg/dL   BUN 7 6 - 20 mg/dL   Creatinine, Ser 1.61 0.61 - 1.24 mg/dL   Calcium 8.8 (L) 8.9 - 10.3 mg/dL   GFR calc non Af Amer >60 >60 mL/min   GFR calc Af Amer >60 >60 mL/min   Anion gap 9 5 - 15  CBC  Result Value Ref Range   WBC 4.7 4.0 - 10.5 K/uL   RBC 3.71 (L) 4.22 - 5.81 MIL/uL   Hemoglobin 12.3 (L) 13.0 - 17.0 g/dL   HCT 09.6 (L) 04.5 - 40.9 %   MCV 94.9 78.0 - 100.0 fL   MCH 33.2 26.0 - 34.0 pg   MCHC 34.9 30.0 - 36.0 g/dL   RDW 81.1 91.4 - 78.2 %   Platelets 156 150 - 400 K/uL  I-stat troponin, ED  Result Value Ref Range   Troponin i, poc 0.00 0.00 - 0.08 ng/mL   Comment 3           Dg Chest 2 View  Result Date: 02/23/2017 CLINICAL DATA:  Chest pain and dyspnea for  several hours tonight. EXAM: CHEST  2 VIEW COMPARISON:  08/25/2016, 08/26/2016. FINDINGS: Severe bullous disease in the apices, right greater than left. Mild compressive atelectasis in the central lungs. This is not changed. No superimposed consolidation. No effusion. No pneumothorax. Unchanged normal heart size. IMPRESSION: Severe bullous disease.  No superimposed acute finding. Electronically Signed   By: Ellery Plunk M.D.   On: 02/23/2017 01:30   Ct Angio Chest Pe W And/or Wo Contrast  Result Date: 02/23/2017 CLINICAL DATA:  Chest pain and dyspnea tonight. EXAM: CT ANGIOGRAPHY CHEST WITH CONTRAST TECHNIQUE: Multidetector CT imaging of the chest was performed using the standard protocol during bolus administration of intravenous contrast. Multiplanar CT image reconstructions and MIPs were obtained to evaluate the vascular anatomy. CONTRAST:  100 mL Isovue 370 intravenous COMPARISON:  08/26/2016 FINDINGS: Cardiovascular: Satisfactory opacification of the pulmonary arteries to the segmental level. No evidence of pulmonary embolism. Normal heart size. No pericardial effusion. Mediastinum/Nodes: No enlarged mediastinal, hilar, or axillary lymph nodes. Thyroid gland, trachea, and esophagus demonstrate no significant findings. Lungs/Pleura: Severe bullous disease, upper lobe predominant. No consolidation. No effusion. No pneumothorax. Airways are patent. Upper Abdomen: No acute abnormality. Musculoskeletal: No chest wall abnormality. No acute or significant osseous findings. Review of the MIP images confirms the above findings. IMPRESSION: 1. Negative for acute pulmonary embolism. 2. Severe bullous disease. No pneumothorax. No consolidation or effusion. Electronically Signed   By: Ellery Plunk M.D.   On: 02/23/2017 02:33    EKG  EKG Interpretation  Date/Time:  Sunday Leroi Haque 08 2018 00:17:40 EDT Ventricular Rate:  79 PR Interval:  136 QRS Duration: 88 QT Interval:  406 QTC Calculation: 465 R  Axis:   76 Text Interpretation:  Normal sinus rhythm Confirmed by [facility]  MD, Rafiq Bucklin ([id number]) on 02/23/2017 12:22:32 AM       Radiology No results found.  Procedures Procedures (including critical care time)  Medications Ordered in ED  Medications  ketorolac (TORADOL) 30 MG/ML injection 30 mg (not administered)  iopamidol (ISOVUE-370) 76 % injection (100 mLs  Contrast Given 02/23/17 0211)     Final Clinical Impressions(s) / ED Diagnoses  Ongoing inspirational  pain, ruled out for MI in the Ed with noram troponin and EKG and ruled out for a PE in the ED.  HEART score is 1 is low risk for MACE. Stable for discharge on NSAIDs.  The patient is nontoxic-appearing on exam and vital signs are normal. Return for fevers, productive cough intractable pain, shortness of breath, leg pain, fevers, intractable vomiting, or any concerns.    After history, exam, and medical workup I feel the patient has been appropriately medically screened and is safe for discharge home. Pertinent diagnoses were discussed with the patient. Patient was given return precautions.  I personally performed the services described in this documentation, which was scribed in my presence. The recorded information has been reviewed and is accurate.      Cy Blamer, MD 02/23/17 [phone]

## 2017-02-25 ENCOUNTER — Ambulatory Visit: Payer: Self-pay

## 2017-03-07 DIAGNOSIS — Z79899 Other long term (current) drug therapy: Secondary | ICD-10-CM | POA: Insufficient documentation

## 2017-03-07 DIAGNOSIS — R600 Localized edema: Secondary | ICD-10-CM | POA: Insufficient documentation

## 2017-03-07 DIAGNOSIS — F17210 Nicotine dependence, cigarettes, uncomplicated: Secondary | ICD-10-CM | POA: Insufficient documentation

## 2017-03-07 DIAGNOSIS — R0989 Other specified symptoms and signs involving the circulatory and respiratory systems: Secondary | ICD-10-CM | POA: Insufficient documentation

## 2017-03-07 DIAGNOSIS — J449 Chronic obstructive pulmonary disease, unspecified: Secondary | ICD-10-CM | POA: Insufficient documentation

## 2017-03-08 ENCOUNTER — Encounter (HOSPITAL_COMMUNITY): Payer: Self-pay | Admitting: Emergency Medicine

## 2017-03-08 ENCOUNTER — Emergency Department (HOSPITAL_COMMUNITY)
Admission: EM | Admit: 2017-03-08 | Discharge: 2017-03-08 | Disposition: A | Discharge status: Home / Self Care | Payer: Self-pay | Attending: Emergency Medicine | Admitting: Emergency Medicine

## 2017-03-08 ENCOUNTER — Emergency Department (HOSPITAL_COMMUNITY): Payer: Self-pay

## 2017-03-08 DIAGNOSIS — R600 Localized edema: Secondary | ICD-10-CM

## 2017-03-08 LAB — CBC WITH DIFFERENTIAL/PLATELET
BASOS ABS: 0 10*3/uL (ref 0.0–0.1)
BASOS PCT: 0 %
Eosinophils Absolute: 0 10*3/uL (ref 0.0–0.7)
Eosinophils Relative: 1 %
HCT: 33.9 % — ABNORMAL LOW (ref 39.0–52.0)
Hemoglobin: 11.2 g/dL — ABNORMAL LOW (ref 13.0–17.0)
Lymphocytes Relative: 31 %
Lymphs Abs: 1.3 10*3/uL (ref 0.7–4.0)
MCH: 32 pg (ref 26.0–34.0)
MCHC: 33 g/dL (ref 30.0–36.0)
MCV: 96.9 fL (ref 78.0–100.0)
MONO ABS: 0.3 10*3/uL (ref 0.1–1.0)
Monocytes Relative: 8 %
NEUTROS ABS: 2.6 10*3/uL (ref 1.7–7.7)
NEUTROS PCT: 60 %
Platelets: 121 10*3/uL — ABNORMAL LOW (ref 150–400)
RBC: 3.5 MIL/uL — ABNORMAL LOW (ref 4.22–5.81)
RDW: 13.2 % (ref 11.5–15.5)
WBC: 4.3 10*3/uL (ref 4.0–10.5)

## 2017-03-08 LAB — COMPREHENSIVE METABOLIC PANEL
ALBUMIN: 2.6 g/dL — AB (ref 3.5–5.0)
ALT: 47 U/L (ref 17–63)
AST: 43 U/L — AB (ref 15–41)
Alkaline Phosphatase: 77 U/L (ref 38–126)
Anion gap: 6 (ref 5–15)
BILIRUBIN TOTAL: 0.7 mg/dL (ref 0.3–1.2)
BUN: 10 mg/dL (ref 6–20)
CHLORIDE: 104 mmol/L (ref 101–111)
CO2: 25 mmol/L (ref 22–32)
Calcium: 8.1 mg/dL — ABNORMAL LOW (ref 8.9–10.3)
Creatinine, Ser: 1.13 mg/dL (ref 0.61–1.24)
GFR calc Af Amer: >60 mL/min (ref >60)
GFR calc non Af Amer: >60 mL/min (ref >60)
Glucose, Bld: 93 mg/dL (ref 65–99)
POTASSIUM: 3.9 mmol/L (ref 3.5–5.1)
SODIUM: 135 mmol/L (ref 135–145)
Total Protein: 6.9 g/dL (ref 6.5–8.1)

## 2017-03-08 LAB — BRAIN NATRIURETIC PEPTIDE: B Natriuretic Peptide: 22.4 pg/mL (ref 0.0–100.0)

## 2017-03-08 MED ORDER — FUROSEMIDE 20 MG PO TABS: 20.0000 mg | ORAL_TABLET | Freq: Every day | ORAL | 0 refills | Status: DC

## 2017-03-08 MED ORDER — FUROSEMIDE 10 MG/ML IJ SOLN
20.0000 mg | Freq: Once | INTRAMUSCULAR | Status: AC
  Administered 2017-03-08: 20 mg via INTRAVENOUS
  Filled 2017-03-08: qty 2

## 2017-03-08 NOTE — ED Provider Notes (Signed)
[facility] DEPT Provider Note   CSN: [id number] Arrival date & time: 03/07/17  2353     History   Chief Complaint Chief Complaint  Patient presents with  . leg pain and swelling    HPI Curtis Hodge is a 51 y.o. male.  Pt w PMHx HIV, chronic Hep B, bullous emphysema, presents w b/l lower leg pain and swelling that began this morning when he woke up. Pt states his pain is constant, sharp, worse w movement, and painful to the touch.  States he is homeless and does a lot of walking. Denies injury, hx DVT, new SOB from baseline, cardiac hx, F/C.       Past Medical History:  Diagnosis Date  . Atypical chest pain 08/07/2016  . Depression 08/07/2016  . Emphysema/COPD (HCC)   . Hepatitis B    HepC  . HIV (human immunodeficiency virus infection) (HCC)   . Incarceration     Patient Active Problem List   Diagnosis Date Noted  . Atypical chest pain 08/07/2016  . Depression 08/07/2016  . HIV disease (HCC) 02/09/2015  . Bullous emphysema (HCC) 02/09/2015  . Musculoskeletal chest pain 02/09/2015  . Falling asleep and waking up too early in the evening 04/26/2014  . Sore throat 04/26/2014  . Sciatic pain 08/30/2013  . Low testosterone 08/30/2013  . Hemorrhoid 08/30/2013  . Blurry vision, bilateral 01/05/2013  . Lumbago 01/05/2013  . Weight loss 07/22/2012  . Face lacerations 07/22/2012  . Incarceration   . Chronic hepatitis B without delta agent without hepatic coma (HCC) 07/11/2010  . CANNABIS ABUSE, EPISODIC 07/11/2010  . SMOKER 07/11/2010  . HIV INFECTION 06/26/2010    Past Surgical History:  Procedure Laterality Date  . AMPUTATION Right 02/04/2017   Procedure: RIGHT SMALL FINGER RAY AMPUTATION;  Surgeon: [name], MD;  Location: [facility];  Service: Orthopedics;  Laterality: Right;  . FLEXOR TENDON REPAIR Right 02/27/2016   Procedure: RIGHT SMALL FINGER FLEXOR TENDON REPAIR;  Surgeon: [name], MD;  Location: [facility];   Service: Orthopedics;  Laterality: Right;  . I&D EXTREMITY  05/21/2012   Procedure: IRRIGATION AND DEBRIDEMENT EXTREMITY;  Surgeon: [name], MD;  Location: [facility];  Service: Orthopedics;  Laterality: Right;       Home Medications    Prior to Admission medications   Medication Sig Start Date End Date Taking? Authorizing Provider  darunavir-cobicistat (PREZCOBIX) 800-150 MG tablet Take 1 tablet by mouth daily. 08/21/16  Yes Randall Hiss, MD  emtricitabine-tenofovir AF (DESCOVY) 200-25 MG tablet Take 1 tablet by mouth daily. 08/21/16  Yes Randall Hiss, MD  ibuprofen (ADVIL,MOTRIN) 800 MG tablet Take 1 tablet (800 mg total) by mouth 3 (three) times daily. 02/23/17  Yes April Palumbo, MD  acetaminophen (TYLENOL) 325 MG tablet Take 2 tablets (650 mg total) by mouth every 6 (six) hours as needed for mild pain or moderate pain. Patient not taking: Reported on 03/08/2017 02/04/17   [name], MD  furosemide (LASIX) 20 MG tablet Take 1 tablet (20 mg total) by mouth daily. 03/08/17   [place] N Russo, PA-C  mometasone-formoterol (DULERA) 100-5 MCG/ACT AERO Inhale 2 puffs into the lungs 2 (two) times daily. Patient not taking: Reported on 03/08/2017 10/07/16   Randall Hiss, MD  oxyCODONE (ROXICODONE) 5 MG immediate release tablet Take 1 tablet (5 mg total) by mouth every 6 (six) hours as needed for severe pain. Patient not taking: Reported on 03/08/2017 02/04/17  [name], MD    Family History No family history on file.  Social History Social History  Substance Use Topics  . Smoking status: Current Every Day Smoker    Packs/day: 0.50    Types: Cigarettes    Last attempt to quit: 05/02/2016  . Smokeless tobacco: Never Used  . Alcohol use Yes     Comment: Weekends.      Allergies   Patient has no known allergies.   Review of Systems Review of Systems  Constitutional: Negative for chills and fever.  Respiratory: Negative for shortness of breath.     Cardiovascular: Positive for leg swelling (b/l). Negative for chest pain.  Musculoskeletal: Positive for myalgias (b/l lower legs).     Physical Exam Updated Vital Signs BP (!) 131/92   Pulse 65   Temp 99.7 F (37.6 C) (Oral)   Resp 18   Ht  (1.778 m)   Wt 72.6 kg   SpO2 96%   BMI 22.96 kg/m   Physical Exam  Constitutional: He appears well-developed and well-nourished. No distress.  HENT:  Head: Normocephalic and atraumatic.  Eyes: Conjunctivae are normal.  Cardiovascular: Normal rate, regular rhythm, normal heart sounds and intact distal pulses.  Exam reveals no friction rub.   No murmur heard. Pulmonary/Chest: Effort normal and breath sounds normal. No respiratory distress. He has no wheezes. He has no rales.  Musculoskeletal: Normal range of motion.  b/l  lower leg edema beginning mid-calf and extending down to ankles, w warmth, mild erythema, tenderness. No wounds, no skin breakdown, no blistering. Negative homan's   Neurological: He is alert.  NV intact  Psychiatric: He has a normal mood and affect. His behavior is normal.  Nursing note and vitals reviewed.    ED Treatments / Results  Labs (all labs ordered are listed, but only abnormal results are displayed) Labs Reviewed  CBC WITH DIFFERENTIAL/PLATELET - Abnormal; Notable for the following:       Result Value   RBC 3.50 (*)    Hemoglobin 11.2 (*)    HCT 33.9 (*)    Platelets 121 (*)    All other components within normal limits  COMPREHENSIVE METABOLIC PANEL - Abnormal; Notable for the following:    Calcium 8.1 (*)    Albumin 2.6 (*)    AST 43 (*)    All other components within normal limits  BRAIN NATRIURETIC PEPTIDE    EKG  EKG Interpretation None       Radiology Dg Chest 2 View  Result Date: 03/08/2017 CLINICAL DATA:  Bilateral leg swelling for 1 day. EXAM: CHEST  2 VIEW COMPARISON:  Chest x-ray dated 02/23/2017. CT angiogram dated 02/23/2017. FINDINGS: Heart size and mediastinal  contours are within normal limits. Severe bullous changes again noted within the upper lobes, right greater than left, better demonstrated on earlier chest CT. No evidence of pneumonia. No pleural effusion or pneumothorax seen. Osseous structures about the chest are unremarkable. IMPRESSION: No active cardiopulmonary disease. No evidence of pneumonia or pulmonary edema. Stable appearance of patient's previously described emphysema with associated severe bullous disease. Electronically Signed   By: Bary Richard M.D.   On: 03/08/2017 08:02    Procedures Procedures (including critical care time)  Medications Ordered in ED Medications  furosemide (LASIX) injection 20 mg (20 mg Intravenous Given 03/08/17 0702)     Initial Impression / Assessment and Plan / ED Course  I have reviewed the triage vital signs and the nursing notes.  Pertinent labs &  imaging results that were available during my care of the patient were reviewed by me and considered in my medical decision making (see chart for details).     Pt w b/l lower extremity edema. BNP and CXR negative for heart failure. CBC without white count. Does not look like cellulitis. Pt afebrile, not tachycardic. Lasix  given in ED, will send home w lasix. Compression stockings also given in ED to wear daily to help w swelling and circulation. Encouraged pt to elevate legs when possible. Pt safe for discharge.  Discussed results, findings, treatment and follow up. Patient advised of return precautions. Patient verbalized understanding and agreed with plan.  Patient discussed with and seen by Dr. Blinda Leatherwood.  Final Clinical Impressions(s) / ED Diagnoses   Final diagnoses:  Bilateral lower extremity edema    New Prescriptions New Prescriptions   FUROSEMIDE (LASIX) 20 MG TABLET    Take 1 tablet (20 mg total) by mouth daily.     [place] N Russo, PA-C 03/08/17 1015    Gilda Crease, MD 03/09/17 [phone]

## 2017-03-08 NOTE — ED Notes (Signed)
Pt given [place] sandwich and sprite. Given bus pass and waiting on ted hose from supply.

## 2017-03-08 NOTE — ED Notes (Signed)
Pt given compression socks.

## 2017-03-08 NOTE — ED Provider Notes (Signed)
Patient presented to the ER with   Face to face Exam: HEENT - PERRLA Lungs - CTAB Heart - RRR, no M/R/G Abd - S/NT/ND Neuro - alert, oriented x3 Musculoskeletal - bilateral symmetric nonpitting edema Skin - not the margin needed slight erythema without warmth bilateral symmetric lower extremities  Plan: Suspect peripheral edema with slight dermatitis, do not suspect cellulitis or infection. Will workup to ensure there is no sign of heart failure. Diuresis.   Gilda Crease, MD 03/08/17 [phone]

## 2017-03-08 NOTE — Discharge Instructions (Signed)
Please read instructions below. Wearing compression stockings during the day. Elevate your legs whenever possible. Take prescribed lasix once a day as needed for swelling. Return to ER for fevers, wounds in your legs, new or worsening symptoms.

## 2017-03-08 NOTE — ED Triage Notes (Signed)
Woke up this morning and noticed pain in BLE and swelling.  Reports it is worse in right leg but feels the pain in both legs.  Some swelling noted to right ankle.  Has not taken anything for pain.

## 2017-03-17 ENCOUNTER — Ambulatory Visit: Admitting: Infectious Disease

## 2017-03-31 ENCOUNTER — Encounter: Payer: Self-pay | Admitting: Infectious Disease

## 2017-05-20 ENCOUNTER — Ambulatory Visit: Payer: Self-pay | Admitting: Infectious Disease

## 2017-05-23 ENCOUNTER — Ambulatory Visit: Admitting: Infectious Disease

## 2017-06-17 NOTE — Telephone Encounter (Signed)
No note in chart 

## 2017-06-23 ENCOUNTER — Encounter (HOSPITAL_COMMUNITY): Payer: Self-pay | Admitting: Emergency Medicine

## 2017-06-23 DIAGNOSIS — Z5321 Procedure and treatment not carried out due to patient leaving prior to being seen by health care provider: Secondary | ICD-10-CM | POA: Insufficient documentation

## 2017-06-23 DIAGNOSIS — R0602 Shortness of breath: Secondary | ICD-10-CM | POA: Insufficient documentation

## 2017-06-23 LAB — BASIC METABOLIC PANEL
Anion gap: 7 (ref 5–15)
BUN: 11 mg/dL (ref 6–20)
CALCIUM: 8.5 mg/dL — AB (ref 8.9–10.3)
CO2: 26 mmol/L (ref 22–32)
CREATININE: 1.13 mg/dL (ref 0.61–1.24)
Chloride: 103 mmol/L (ref 101–111)
GFR calc non Af Amer: >60 mL/min (ref >60)
Glucose, Bld: 88 mg/dL (ref 65–99)
Potassium: 3.7 mmol/L (ref 3.5–5.1)
Sodium: 136 mmol/L (ref 135–145)

## 2017-06-23 LAB — CBC
HCT: 37.8 % — ABNORMAL LOW (ref 39.0–52.0)
Hemoglobin: 12.9 g/dL — ABNORMAL LOW (ref 13.0–17.0)
MCH: 32.4 pg (ref 26.0–34.0)
MCHC: 34.1 g/dL (ref 30.0–36.0)
MCV: 95 fL (ref 78.0–100.0)
Platelets: 147 10*3/uL — ABNORMAL LOW (ref 150–400)
RBC: 3.98 MIL/uL — ABNORMAL LOW (ref 4.22–5.81)
RDW: 12.3 % (ref 11.5–15.5)
WBC: 3.3 10*3/uL — ABNORMAL LOW (ref 4.0–10.5)

## 2017-06-23 LAB — BRAIN NATRIURETIC PEPTIDE: B Natriuretic Peptide: 38.3 pg/mL (ref 0.0–100.0)

## 2017-06-23 NOTE — ED Notes (Signed)
Pt reports SOB X few days and bilateral lower leg swelling. Dry cough noted. Sats 100% RA

## 2017-06-24 ENCOUNTER — Emergency Department (HOSPITAL_COMMUNITY)
Admission: EM | Admit: 2017-06-24 | Discharge: 2017-06-24 | Disposition: A | Discharge status: Home / Self Care | Payer: No Typology Code available for payment source | Attending: Emergency Medicine | Admitting: Emergency Medicine

## 2017-06-24 HISTORY — DX: Gout, unspecified: M10.9

## 2017-06-24 NOTE — ED Notes (Signed)
Pt called for vitals X 4 without answer

## 2017-07-25 ENCOUNTER — Telehealth: Payer: Self-pay | Admitting: *Deleted

## 2017-07-25 NOTE — Telephone Encounter (Signed)
[facility] not refilled patient's Descovy and Prezcobix since December 2017. Spoke to [employer] Pharmacist and stated should deny meds until patient's comes in for labs on 07/28/17 and labs result. Pharmacy notified Wendall MolaJacqueline Cockerham

## 2017-07-28 ENCOUNTER — Other Ambulatory Visit: Payer: No Typology Code available for payment source

## 2017-07-29 ENCOUNTER — Encounter: Payer: Self-pay | Admitting: Infectious Disease

## 2017-08-06 ENCOUNTER — Ambulatory Visit: Payer: No Typology Code available for payment source | Admitting: Infectious Disease

## 2017-08-13 ENCOUNTER — Emergency Department (HOSPITAL_COMMUNITY): Payer: Self-pay

## 2017-08-13 ENCOUNTER — Encounter (HOSPITAL_COMMUNITY): Payer: Self-pay | Admitting: Emergency Medicine

## 2017-08-13 ENCOUNTER — Emergency Department (HOSPITAL_COMMUNITY)
Admission: EM | Admit: 2017-08-13 | Discharge: 2017-08-13 | Disposition: A | Discharge status: Home / Self Care | Payer: Self-pay | Attending: Emergency Medicine | Admitting: Emergency Medicine

## 2017-08-13 DIAGNOSIS — Z79899 Other long term (current) drug therapy: Secondary | ICD-10-CM | POA: Insufficient documentation

## 2017-08-13 DIAGNOSIS — F1410 Cocaine abuse, uncomplicated: Secondary | ICD-10-CM | POA: Insufficient documentation

## 2017-08-13 DIAGNOSIS — B20 Human immunodeficiency virus [HIV] disease: Secondary | ICD-10-CM | POA: Insufficient documentation

## 2017-08-13 DIAGNOSIS — R079 Chest pain, unspecified: Secondary | ICD-10-CM | POA: Insufficient documentation

## 2017-08-13 DIAGNOSIS — Z720 Tobacco use: Secondary | ICD-10-CM | POA: Insufficient documentation

## 2017-08-13 LAB — URINALYSIS, ROUTINE W REFLEX MICROSCOPIC
Bilirubin Urine: NEGATIVE
GLUCOSE, UA: NEGATIVE mg/dL
Ketones, ur: NEGATIVE mg/dL
NITRITE: NEGATIVE
PH: 5 (ref 5.0–8.0)
Protein, ur: 100 mg/dL — AB
SPECIFIC GRAVITY, URINE: 1.018 (ref 1.005–1.030)
Squamous Epithelial / LPF: NONE SEEN

## 2017-08-13 LAB — BASIC METABOLIC PANEL
ANION GAP: 6 (ref 5–15)
BUN: 7 mg/dL (ref 6–20)
CALCIUM: 8.4 mg/dL — AB (ref 8.9–10.3)
CO2: 26 mmol/L (ref 22–32)
Chloride: 108 mmol/L (ref 101–111)
Creatinine, Ser: 1.26 mg/dL — ABNORMAL HIGH (ref 0.61–1.24)
GFR calc Af Amer: >60 mL/min (ref >60)
GFR calc non Af Amer: >60 mL/min (ref >60)
GLUCOSE: 109 mg/dL — AB (ref 65–99)
POTASSIUM: 4.2 mmol/L (ref 3.5–5.1)
SODIUM: 140 mmol/L (ref 135–145)

## 2017-08-13 LAB — RAPID URINE DRUG SCREEN, HOSP PERFORMED
AMPHETAMINES: NOT DETECTED
BARBITURATES: NOT DETECTED
Benzodiazepines: NOT DETECTED
Cocaine: POSITIVE — AB
Opiates: NOT DETECTED
TETRAHYDROCANNABINOL: POSITIVE — AB

## 2017-08-13 LAB — CBC
HEMATOCRIT: 34.1 % — AB (ref 39.0–52.0)
HEMOGLOBIN: 11.5 g/dL — AB (ref 13.0–17.0)
MCH: 32.8 pg (ref 26.0–34.0)
MCHC: 33.7 g/dL (ref 30.0–36.0)
MCV: 97.2 fL (ref 78.0–100.0)
Platelets: 106 10*3/uL — ABNORMAL LOW (ref 150–400)
RBC: 3.51 MIL/uL — AB (ref 4.22–5.81)
RDW: 13.6 % (ref 11.5–15.5)
WBC: 3.2 10*3/uL — ABNORMAL LOW (ref 4.0–10.5)

## 2017-08-13 LAB — I-STAT TROPONIN, ED: Troponin i, poc: 0 ng/mL (ref 0.00–0.08)

## 2017-08-13 NOTE — ED Notes (Signed)
Patient reports to RN regarding midsternal chest pain that he states is constant pressure. Rates pain 7/10. As RN documenting in chart, patient snoring, eyes closed. No distress noted.

## 2017-08-13 NOTE — ED Notes (Signed)
Attempted to provide discharge instructions to patient. Patient on phone. Requested patient to place person on hold. Patient ignored Charity fundraiser and continued conversation. Discharge instructions given to patient.

## 2017-08-13 NOTE — ED Provider Notes (Signed)
[facility]-EMERGENCY DEPT Provider Note   CSN: [id number] Arrival date & time: 08/13/17  0240     History   Chief Complaint Chief Complaint  Patient presents with  . Chest Pain    HPI Curtis Hodge is a 51 y.o. male.  The patient presents for evaluation of chest pain.  The pain is worse with deep breathing, and movement.  He also reports he has not taken any of his medicine, for 2 weeks, but it has "been sent to my mother's house."  He states he has been staying at various places, but essentially is homeless at this time.  He is also hungry, and states he has not had much to eat recently.  He denies productive cough, fever, chills, nausea, vomiting or dizziness.  There are no other known modifying factors.  HPI  Past Medical History:  Diagnosis Date  . Atypical chest pain 08/07/2016  . Depression 08/07/2016  . Emphysema/COPD (HCC)   . Gout   . Hepatitis B    HepC  . HIV (human immunodeficiency virus infection) (HCC)   . Incarceration     Patient Active Problem List   Diagnosis Date Noted  . Atypical chest pain 08/07/2016  . Depression 08/07/2016  . HIV disease (HCC) 02/09/2015  . Bullous emphysema (HCC) 02/09/2015  . Musculoskeletal chest pain 02/09/2015  . Falling asleep and waking up too early in the evening 04/26/2014  . Sore throat 04/26/2014  . Sciatic pain 08/30/2013  . Low testosterone 08/30/2013  . Hemorrhoid 08/30/2013  . Blurry vision, bilateral 01/05/2013  . Lumbago 01/05/2013  . Weight loss 07/22/2012  . Face lacerations 07/22/2012  . Incarceration   . Chronic hepatitis B without delta agent without hepatic coma (HCC) 07/11/2010  . CANNABIS ABUSE, EPISODIC 07/11/2010  . SMOKER 07/11/2010  . HIV INFECTION 06/26/2010    Past Surgical History:  Procedure Laterality Date  . AMPUTATION Right 02/04/2017   Procedure: RIGHT SMALL FINGER RAY AMPUTATION;  Surgeon: [name], MD;  Location: [facility];  Service: Orthopedics;  Laterality:  Right;  . FLEXOR TENDON REPAIR Right 02/27/2016   Procedure: RIGHT SMALL FINGER FLEXOR TENDON REPAIR;  Surgeon: [name], MD;  Location: [facility];  Service: Orthopedics;  Laterality: Right;  . I&D EXTREMITY  05/21/2012   Procedure: IRRIGATION AND DEBRIDEMENT EXTREMITY;  Surgeon: [name], MD;  Location: [facility] OR;  Service: Orthopedics;  Laterality: Right;       Home Medications    Prior to Admission medications   Medication Sig Start Date End Date Taking? Authorizing Provider  acetaminophen (TYLENOL) 325 MG tablet Take 2 tablets (650 mg total) by mouth every 6 (six) hours as needed for mild pain or moderate pain. Patient not taking: Reported on 03/08/2017 02/04/17   [name], MD  darunavir-cobicistat (PREZCOBIX) 800-150 MG tablet Take 1 tablet by mouth daily. 08/21/16   Randall Hiss, MD  emtricitabine-tenofovir AF (DESCOVY) 200-25 MG tablet Take 1 tablet by mouth daily. 08/21/16   Randall Hiss, MD  furosemide (LASIX) 20 MG tablet Take 1 tablet (20 mg total) by mouth daily. 03/08/17   Russo, [place] N, PA-C  ibuprofen (ADVIL,MOTRIN) 800 MG tablet Take 1 tablet (800 mg total) by mouth 3 (three) times daily. 02/23/17   Palumbo, April, MD  mometasone-formoterol (DULERA) 100-5 MCG/ACT AERO Inhale 2 puffs into the lungs 2 (two) times daily. Patient not taking: Reported on 03/08/2017 10/07/16   Daiva Eves, Lisette Grinder, MD  oxyCODONE (ROXICODONE)  5 MG immediate release tablet Take 1 tablet (5 mg total) by mouth every 6 (six) hours as needed for severe pain. Patient not taking: Reported on 03/08/2017 02/04/17   [name], MD    Family History No family history on file.  Social History Social History  Substance Use Topics  . Smoking status: Current Every Day Smoker    Packs/day: 0.50    Types: Cigarettes    Last attempt to quit: 05/02/2016  . Smokeless tobacco: Never Used  . Alcohol use Yes     Comment: Weekends.      Allergies   Patient has no  known allergies.   Review of Systems Review of Systems  All other systems reviewed and are negative.    Physical Exam Updated Vital Signs BP 125/84   Pulse (!) 57   Temp 98.1 F (36.7 C) (Oral)   Resp 12   Ht  (1.778 m)   Wt 72.6 kg (160 lb)   SpO2 98%   BMI 22.96 kg/m   Physical Exam  Constitutional: He is oriented to person, place, and time. He appears well-developed.  He appears older than stated age.  HENT:  Head: Normocephalic and atraumatic.  Right Ear: External ear normal.  Left Ear: External ear normal.  Eyes: Pupils are equal, round, and reactive to light. Conjunctivae and EOM are normal.  Neck: Normal range of motion and phonation normal. Neck supple.  Cardiovascular: Normal rate, regular rhythm and normal heart sounds.   No murmur heard. Pulmonary/Chest: Effort normal and breath sounds normal. No respiratory distress. He exhibits no bony tenderness.  Musculoskeletal: Normal range of motion.  Neurological: He is alert and oriented to person, place, and time. No cranial nerve deficit or sensory deficit. He exhibits normal muscle tone. Coordination normal.  Skin: Skin is warm, dry and intact.  Psychiatric: He has a normal mood and affect. His behavior is normal. Judgment and thought content normal.  Nursing note and vitals reviewed.    ED Treatments / Results  Labs (all labs ordered are listed, but only abnormal results are displayed) Labs Reviewed  BASIC METABOLIC PANEL - Abnormal; Notable for the following:       Result Value   Glucose, Bld 109 (*)    Creatinine, Ser 1.26 (*)    Calcium 8.4 (*)    All other components within normal limits  CBC - Abnormal; Notable for the following:    WBC 3.2 (*)    RBC 3.51 (*)    Hemoglobin 11.5 (*)    HCT 34.1 (*)    Platelets 106 (*)    All other components within normal limits  RAPID URINE DRUG SCREEN, HOSP PERFORMED - Abnormal; Notable for the following:    Cocaine POSITIVE (*)    Tetrahydrocannabinol  POSITIVE (*)    All other components within normal limits  URINALYSIS, ROUTINE W REFLEX MICROSCOPIC - Abnormal; Notable for the following:    APPearance CLOUDY (*)    Hgb urine dipstick LARGE (*)    Protein, ur 100 (*)    Leukocytes, UA TRACE (*)    Bacteria, UA RARE (*)    All other components within normal limits  I-STAT TROPONIN, ED    EKG  EKG Interpretation  Date/Time:  Wednesday August 13 2017 02:43:48 EDT Ventricular Rate:  74 PR Interval:  150 QRS Duration: 86 QT Interval:  340 QTC Calculation: 377 R Axis:   74 Text Interpretation:  Normal sinus rhythm Nonspecific T wave abnormality Abnormal ECG  When compared with ECG of 06/23/2017, No significant change was found Confirmed by Dione Booze ([id number]) on 08/13/2017 3:00:50 AM Also confirmed by Dione Booze ([id number]), editor Barbette Hair [phone])  on 08/13/2017 7:40:21 AM       Radiology Dg Chest 2 View  Result Date: 08/13/2017 CLINICAL DATA:  Chest pain. EXAM: CHEST  2 VIEW COMPARISON:  Radiograph 03/08/2017.  CT 02/23/2017 FINDINGS: Bullous emphysema, most prominent in the right upper lobe. No consolidation, pleural fluid, pulmonary edema or pneumothorax. Normal heart size and mediastinal contours. No acute osseous abnormality. IMPRESSION: Bullous emphysema without acute abnormality. Electronically Signed   By: Rubye Oaks M.D.   On: 08/13/2017 03:38    Procedures Procedures (including critical care time)  Medications Ordered in ED Medications - No data to display   Initial Impression / Assessment and Plan / ED Course  I have reviewed the triage vital signs and the nursing notes.  Pertinent labs & imaging results that were available during my care of the patient were reviewed by me and considered in my medical decision making (see chart for details).  Cli nical Course as of Aug 13 954  Wed Aug 13, 2017  9147 Bullae without infiltrate DG Chest 2 View [EW]  [phone] normal Troponin i, poc: 0.00 [EW]  0742 Slightly  low Hemoglobin: (!) 11.5 [EW]  0742 Slight elevation Creatinine: (!) 1.26 [EW]  0953 Substance of abuse COCAINE: (!) POSITIVE [EW]    Clinical Course User Index [EW] Mancel Bale, MD  i   Patient Vitals for the past 24 hrs:  BP Temp Temp src Pulse Resp SpO2 Height Weight  08/13/17 0945 125/84 - - (!) 57 12 98 % - -  08/13/17 0900 123/84 - - 60 12 99 % - -  08/13/17 0830 117/87 - - 64 12 98 % - -  08/13/17 0800 (!) 136/94 - - 60 12 99 % - -  08/13/17 0727 (!) 139/93 - - 90 16 100 % - -  08/13/17 0249 - - - - - -  (1.778 m) 72.6 kg (160 lb)  08/13/17 0248 113/90 98.1 F (36.7 C) Oral 84 18 98 % - -    9:54 AM Reevaluation with update and discussion. After initial assessment and treatment, an updated evaluation reveals no change in clinical status.  Findings discussed with patient, all questions answered. Thessaly Mccullers L    Final Clinical Impressions(s) / ED Diagnoses   Final diagnoses:  Nonspecific chest pain  Cocaine abuse  Tobacco abuse    Nonspecific chest pain.  Doubt PE, pneumonia or ACS.  Patient with polysubstance abuse, cocaine and marijuana.  No evidence for acute unstable hemodynamic process, metabolic instability or serious bacterial infection.  Nursing Notes Reviewed/ Care Coordinated Applicable Imaging Reviewed Interpretation of Laboratory Data incorporated into ED treatment  The patient appears reasonably screened and/or stabilized for discharge and I doubt any other medical condition or other [facility] requiring further screening, evaluation, or treatment in the ED at this time prior to discharge.  Plan: Home Medications-OTC analgesia ,restart home meds as soon as possible; Home Treatments-stop using cocaine; return here if the recommended treatment, does not improve the symptoms; Recommended follow up-PCP, as needed   New Prescriptions New Prescriptions   No medications on file     Mancel Bale, MD 08/13/17 [phone]

## 2017-08-13 NOTE — ED Notes (Signed)
SW at bedside at this time

## 2017-08-13 NOTE — Discharge Planning (Signed)
[facility] consulted to regarding medication assistance.  EDCM met with pt at bedside; pt was on the phone- [place] attempted to wait until he was finished with phone call, but pt insisted on completing interview.  Pt states he does not have a problem getting meds and that they are currently at his mother's house.  He states he will go to his mother's house upon discharge to retreive his meds.  Pt familiar with [facility] medication assistance and health clinic for the homeless.  No further CM needs identified at this time.

## 2017-08-13 NOTE — Discharge Instructions (Signed)
The testing today did not show any problems with your lungs or heart.  It is important to avoid using cocaine.  For pain try taking Tylenol.  Stop smoking cigarettes.

## 2017-08-13 NOTE — ED Notes (Signed)
Pt reports central chest pressure X3 days with SOB. Pt appears to be in NAD.

## 2017-10-22 ENCOUNTER — Emergency Department (HOSPITAL_COMMUNITY): Payer: Self-pay

## 2017-10-22 ENCOUNTER — Emergency Department (HOSPITAL_COMMUNITY)
Admission: EM | Admit: 2017-10-22 | Discharge: 2017-10-23 | Disposition: A | Discharge status: Home / Self Care | Payer: Self-pay | Attending: Emergency Medicine | Admitting: Emergency Medicine

## 2017-10-22 ENCOUNTER — Encounter (HOSPITAL_COMMUNITY): Payer: Self-pay | Admitting: Emergency Medicine

## 2017-10-22 DIAGNOSIS — B20 Human immunodeficiency virus [HIV] disease: Secondary | ICD-10-CM | POA: Insufficient documentation

## 2017-10-22 DIAGNOSIS — Z79899 Other long term (current) drug therapy: Secondary | ICD-10-CM | POA: Insufficient documentation

## 2017-10-22 DIAGNOSIS — R0789 Other chest pain: Secondary | ICD-10-CM

## 2017-10-22 DIAGNOSIS — F17210 Nicotine dependence, cigarettes, uncomplicated: Secondary | ICD-10-CM | POA: Insufficient documentation

## 2017-10-22 LAB — CBC
HEMATOCRIT: 36.9 % — AB (ref 39.0–52.0)
Hemoglobin: 12.5 g/dL — ABNORMAL LOW (ref 13.0–17.0)
MCH: 32.2 pg (ref 26.0–34.0)
MCHC: 33.9 g/dL (ref 30.0–36.0)
MCV: 95.1 fL (ref 78.0–100.0)
PLATELETS: 107 10*3/uL — AB (ref 150–400)
RBC: 3.88 MIL/uL — ABNORMAL LOW (ref 4.22–5.81)
RDW: 12.7 % (ref 11.5–15.5)
WBC: 4.2 10*3/uL (ref 4.0–10.5)

## 2017-10-22 LAB — BASIC METABOLIC PANEL
Anion gap: 8 (ref 5–15)
BUN: 10 mg/dL (ref 6–20)
CALCIUM: 8.4 mg/dL — AB (ref 8.9–10.3)
CO2: 28 mmol/L (ref 22–32)
CREATININE: 1.19 mg/dL (ref 0.61–1.24)
Chloride: 101 mmol/L (ref 101–111)
GFR calc Af Amer: >60 mL/min (ref >60)
Glucose, Bld: 108 mg/dL — ABNORMAL HIGH (ref 65–99)
Potassium: 3.5 mmol/L (ref 3.5–5.1)
SODIUM: 137 mmol/L (ref 135–145)

## 2017-10-22 LAB — I-STAT TROPONIN, ED: TROPONIN I, POC: 0.01 ng/mL (ref 0.00–0.08)

## 2017-10-22 NOTE — ED Triage Notes (Signed)
Pt to ER reports 5-6 says of shortness of breath with cp with deep breathing. Pt states leaning forward helps. NAD. VSS.

## 2017-10-23 LAB — T-HELPER CELLS (CD4) COUNT (NOT AT ARMC)
CD4 % Helper T Cell: 18 % — ABNORMAL LOW (ref 33–55)
CD4 T Cell Abs: 210 /uL — ABNORMAL LOW (ref 400–2700)

## 2017-10-23 LAB — I-STAT TROPONIN, ED: TROPONIN I, POC: 0.01 ng/mL (ref 0.00–0.08)

## 2017-10-23 MED ORDER — TRAMADOL HCL 50 MG PO TABS: 50.0000 mg | ORAL_TABLET | Freq: Four times a day (QID) | ORAL | 0 refills | Status: DC | PRN

## 2017-10-23 NOTE — ED Provider Notes (Signed)
[facility] EM[facility]ENC[facility] Provider Note   CSN: [id number] Arrival date & time: 10/22/17  1820     History   Chief Complaint Chief Complaint  Patient presents with  . Shortness of Breath  . Chest Pain    HPI Curtis Hodge is a 51 y.o. male.  Patient presents to the ER for evaluation of chest pain.  Patient reports that symptoms have been ongoing for 4 or 5 days.  He is having left-sided pain and heaviness that worsens when he takes a deep breath or bends or twists.  He reports that he feels better if he leans forward and over to the left side.  Changing positions worsens the pain.  He denies any direct trauma.  Patient reports that he has HIV, has been off of his meds for approximately 3 months.  He lists a host of reasons why he could not get to the infectious disease clinic or get his medications.  He appears to be under a lot of stress.      Past Medical History:  Diagnosis Date  . Atypical chest pain 08/07/2016  . Depression 08/07/2016  . Emphysema/COPD (HCC)   . Gout   . Hepatitis B    HepC  . HIV (human immunodeficiency virus infection) (HCC)   . Incarceration     Patient Active Problem List   Diagnosis Date Noted  . Atypical chest pain 08/07/2016  . Depression 08/07/2016  . HIV disease (HCC) 02/09/2015  . Bullous emphysema (HCC) 02/09/2015  . Musculoskeletal chest pain 02/09/2015  . Falling asleep and waking up too early in the evening 04/26/2014  . Sore throat 04/26/2014  . Sciatic pain 08/30/2013  . Low testosterone 08/30/2013  . Hemorrhoid 08/30/2013  . Blurry vision, bilateral 01/05/2013  . Lumbago 01/05/2013  . Weight loss 07/22/2012  . Face lacerations 07/22/2012  . Incarceration   . Chronic hepatitis B without delta agent without hepatic coma (HCC) 07/11/2010  . CANNABIS ABUSE, EPISODIC 07/11/2010  . SMOKER 07/11/2010  . HIV INFECTION 06/26/2010    Past Surgical History:  Procedure Laterality Date  . AMPUTATION  Right 02/04/2017   Procedure: RIGHT SMALL FINGER RAY AMPUTATION;  Surgeon: Mack Hookavid Thompson, MD;  Location: [facility];  Service: Orthopedics;  Laterality: Right;  . FLEXOR TENDON REPAIR Right 02/27/2016   Procedure: RIGHT SMALL FINGER FLEXOR TENDON REPAIR;  Surgeon: Mack Hookavid Thompson, MD;  Location: [facility];  Service: Orthopedics;  Laterality: Right;  . I&D EXTREMITY  05/21/2012   Procedure: IRRIGATION AND DEBRIDEMENT EXTREMITY;  Surgeon: Javier DockerJeffrey C Beane, MD;  Location: [facility] OR;  Service: Orthopedics;  Laterality: Right;       Home Medications    Prior to Admission medications   Medication Sig Start Date End Date Taking? Authorizing Provider  acetaminophen (TYLENOL) 325 MG tablet Take 2 tablets (650 mg total) by mouth every 6 (six) hours as needed for mild pain or moderate pain. 02/04/17   Mack Hookhompson, David, MD  darunavir-cobicistat (PREZCOBIX) 800-150 MG tablet Take 1 tablet by mouth daily. 08/21/16   Randall HissVan Dam, Cornelius N, MD  emtricitabine-tenofovir AF (DESCOVY) 200-25 MG tablet Take 1 tablet by mouth daily. 08/21/16   Randall HissVan Dam, Cornelius N, MD  furosemide (LASIX) 20 MG tablet Take 1 tablet (20 mg total) by mouth daily. 03/08/17   Robinson, [place] N, PA-C  ibuprofen (ADVIL,MOTRIN) 800 MG tablet Take 1 tablet (800 mg total) by mouth 3 (three) times daily. 02/23/17   Palumbo, April, MD  mometasone-formoterol (DULERA) 100-5 MCG/ACT AERO Inhale 2 puffs into the lungs 2 (two) times daily. 10/07/16   Randall Hiss, MD  traMADol (ULTRAM) 50 MG tablet Take 1 tablet (50 mg total) by mouth every 6 (six) hours as needed. 10/23/17   Gilda Crease, MD    Family History History reviewed. No pertinent family history.  Social History Social History   Tobacco Use  . Smoking status: Current Every Day Smoker    Packs/day: 0.50    Types: Cigarettes    Last attempt to quit: 05/02/2016    Years since quitting: 1.4  . Smokeless tobacco: Never Used  Substance Use Topics  .  Alcohol use: Yes    Comment: Weekends.   . Drug use: Yes    Frequency: 5.0 times per week    Types: Marijuana    Comment: last smoked 1 week ago     Allergies   Patient has no known allergies.   Review of Systems Review of Systems  Respiratory: Positive for shortness of breath.   Cardiovascular: Positive for chest pain.  Psychiatric/Behavioral: The patient is nervous/anxious.   All other systems reviewed and are negative.    Physical Exam Updated Vital Signs BP 110/75 (BP Location: Right Arm)   Pulse (!) 59   Temp 98.1 F (36.7 C) (Oral)   Resp 15   SpO2 97%   Physical Exam  Constitutional: He is oriented to person, place, and time. He appears well-developed and well-nourished. No distress.  HENT:  Head: Normocephalic and atraumatic.  Right Ear: Hearing normal.  Left Ear: Hearing normal.  Nose: Nose normal.  Mouth/Throat: Oropharynx is clear and moist and mucous membranes are normal.  Eyes: Conjunctivae and EOM are normal. Pupils are equal, round, and reactive to light.  Neck: Normal range of motion. Neck supple.  Cardiovascular: Regular rhythm, S1 normal and S2 normal. Exam reveals no gallop and no friction rub.  No murmur heard. Pulmonary/Chest: Effort normal and breath sounds normal. No respiratory distress. He exhibits tenderness.    Abdominal: Soft. Normal appearance and bowel sounds are normal. There is no hepatosplenomegaly. There is no tenderness. There is no rebound, no guarding, no tenderness at McBurney's point and negative Murphy's sign. No hernia.  Musculoskeletal: Normal range of motion.  Neurological: He is alert and oriented to person, place, and time. He has normal strength. No cranial nerve deficit or sensory deficit. Coordination normal. GCS eye subscore is 4. GCS verbal subscore is 5. GCS motor subscore is 6.  Skin: Skin is warm, dry and intact. No rash noted. No cyanosis.  Psychiatric: He has a normal mood and affect. His speech is normal and  behavior is normal. Thought content normal.  Nursing note and vitals reviewed.    ED Treatments / Results  Labs (all labs ordered are listed, but only abnormal results are displayed) Labs Reviewed  BASIC METABOLIC PANEL - Abnormal; Notable for the following components:      Result Value   Glucose, Bld 108 (*)    Calcium 8.4 (*)    All other components within normal limits  CBC - Abnormal; Notable for the following components:   RBC 3.88 (*)    Hemoglobin 12.5 (*)    HCT 36.9 (*)    Platelets 107 (*)    All other components within normal limits  T-HELPER CELLS (CD4) COUNT (NOT AT [facility])  HIV-1 RNA ULTRAQUANT REFLEX TO GENTYP+  I-STAT TROPONIN, ED  I-STAT TROPONIN, ED    EKG  EKG Interpretation  Date/Time:  Wednesday October 22 2017 18:37:22 EST Ventricular Rate:  76 PR Interval:  130 QRS Duration: 84 QT Interval:  398 QTC Calculation: 447 R Axis:   84 Text Interpretation:  Normal sinus rhythm Nonspecific T wave abnormality Abnormal ECG Confirmed by Gilda CreasePollina, Sederick Jacobsen J [phone]([id number]) on 10/23/2017 12:06:45 AM       Radiology Dg Chest 2 View  Result Date: 10/22/2017 CLINICAL DATA:  Shortness of breath, LEFT chest pain, LEFT jaw and axillary pain with tingling in the fingers of LEFT hand for 1 week, history HIV, emphysema, COPD EXAM: CHEST  2 VIEW COMPARISON:  08/13/2017 FINDINGS: Normal heart size, mediastinal contours, and pulmonary vascularity. Emphysematous changes with severe bullous disease of RIGHT greater than LEFT upper lobes. Increased markings at RIGHT base since previous exam could represent atelectasis or developing infiltrate. Remaining lungs clear. No pleural effusion or pneumothorax. Osseous structures unremarkable. IMPRESSION: Severe bullous COPD changes with increased opacity at the RIGHT base question increased atelectasis versus developing early infiltrate. Electronically Signed   By: Ulyses SouthwardMark  Boles M.D.   On: 10/22/2017 20:31    Procedures Procedures  (including critical care time)  Medications Ordered in ED Medications - No data to display   Initial Impression / Assessment and Plan / ED Course  I have reviewed the triage vital signs and the nursing notes.  Pertinent labs & imaging results that were available during my care of the patient were reviewed by me and considered in my medical decision making (see chart for details).     Patient presents to the emergency department for evaluation of multiple problems.  She has main complaint is that he has had 5 or 6 days of chest pain.  That this appears to be positional.  Patient finds certain positions where the pain resolves and then it recurs if he changes positions.  This is consistent with chest wall pain.  I can reproduce it here in the ER by maneuvering him.  His workup does not suggest acute coronary syndrome.  As he has had the symptoms for more than 5 days, normal troponin, no ischemia or infarct on EKG I feel that he is appropriate for outpatient management.  Patient does report concern over his HIV status.  He has not been on his medications for 3 months.  [facility].  I did order RNA quantitative and T-helper cell studies to be followed up in the clinic.  Patient was counseled that he needs to follow-up for reinitiation of his antiviral treatment.  Final Clinical Impressions(s) / ED Diagnoses   Final diagnoses:  Chest wall pain  HIV (human immunodeficiency virus infection) [facility](HCC)    ED Discharge Orders        Ordered    traMADol (ULTRAM) 50 MG tablet  Every 6 hours PRN     10/23/17 0702       Gilda CreasePollina, Nyari Olsson J, MD 10/23/17 [id number]

## 2017-10-23 NOTE — ED Notes (Signed)
ED Provider at bedside. 

## 2017-10-24 ENCOUNTER — Encounter (HOSPITAL_COMMUNITY): Payer: Self-pay

## 2017-10-24 ENCOUNTER — Other Ambulatory Visit: Payer: Self-pay

## 2017-10-24 ENCOUNTER — Emergency Department (HOSPITAL_COMMUNITY)
Admission: EM | Admit: 2017-10-24 | Discharge: 2017-10-25 | Disposition: A | Discharge status: Other Acute Inpatient Hospital | Payer: Self-pay | Attending: Emergency Medicine | Admitting: Emergency Medicine

## 2017-10-24 ENCOUNTER — Ambulatory Visit (INDEPENDENT_AMBULATORY_CARE_PROVIDER_SITE_OTHER): Payer: Self-pay | Admitting: Licensed Clinical Social Worker

## 2017-10-24 DIAGNOSIS — R45851 Suicidal ideations: Secondary | ICD-10-CM | POA: Insufficient documentation

## 2017-10-24 DIAGNOSIS — J449 Chronic obstructive pulmonary disease, unspecified: Secondary | ICD-10-CM | POA: Insufficient documentation

## 2017-10-24 DIAGNOSIS — B20 Human immunodeficiency virus [HIV] disease: Secondary | ICD-10-CM | POA: Insufficient documentation

## 2017-10-24 DIAGNOSIS — Z008 Encounter for other general examination: Secondary | ICD-10-CM

## 2017-10-24 DIAGNOSIS — Z79899 Other long term (current) drug therapy: Secondary | ICD-10-CM | POA: Insufficient documentation

## 2017-10-24 DIAGNOSIS — Z7289 Other problems related to lifestyle: Secondary | ICD-10-CM

## 2017-10-24 DIAGNOSIS — Z720 Tobacco use: Secondary | ICD-10-CM

## 2017-10-24 DIAGNOSIS — D696 Thrombocytopenia, unspecified: Secondary | ICD-10-CM | POA: Insufficient documentation

## 2017-10-24 DIAGNOSIS — R45850 Homicidal ideations: Secondary | ICD-10-CM | POA: Insufficient documentation

## 2017-10-24 DIAGNOSIS — Z046 Encounter for general psychiatric examination, requested by authority: Secondary | ICD-10-CM | POA: Insufficient documentation

## 2017-10-24 DIAGNOSIS — F329 Major depressive disorder, single episode, unspecified: Secondary | ICD-10-CM | POA: Insufficient documentation

## 2017-10-24 DIAGNOSIS — R440 Auditory hallucinations: Secondary | ICD-10-CM | POA: Insufficient documentation

## 2017-10-24 DIAGNOSIS — F1910 Other psychoactive substance abuse, uncomplicated: Secondary | ICD-10-CM | POA: Insufficient documentation

## 2017-10-24 DIAGNOSIS — F17210 Nicotine dependence, cigarettes, uncomplicated: Secondary | ICD-10-CM | POA: Insufficient documentation

## 2017-10-24 DIAGNOSIS — Z789 Other specified health status: Secondary | ICD-10-CM | POA: Insufficient documentation

## 2017-10-24 LAB — CBC
HEMATOCRIT: 38.5 % — AB (ref 39.0–52.0)
HEMOGLOBIN: 13.1 g/dL (ref 13.0–17.0)
MCH: 32.7 pg (ref 26.0–34.0)
MCHC: 34 g/dL (ref 30.0–36.0)
MCV: 96 fL (ref 78.0–100.0)
Platelets: 119 10*3/uL — ABNORMAL LOW (ref 150–400)
RBC: 4.01 MIL/uL — AB (ref 4.22–5.81)
RDW: 12.6 % (ref 11.5–15.5)
WBC: 3 10*3/uL — ABNORMAL LOW (ref 4.0–10.5)

## 2017-10-24 LAB — COMPREHENSIVE METABOLIC PANEL
ALBUMIN: 3.1 g/dL — AB (ref 3.5–5.0)
ALT: 84 U/L — ABNORMAL HIGH (ref 17–63)
AST: 79 U/L — AB (ref 15–41)
Alkaline Phosphatase: 97 U/L (ref 38–126)
Anion gap: 6 (ref 5–15)
BILIRUBIN TOTAL: 0.8 mg/dL (ref 0.3–1.2)
BUN: 8 mg/dL (ref 6–20)
CO2: 26 mmol/L (ref 22–32)
Calcium: 8.6 mg/dL — ABNORMAL LOW (ref 8.9–10.3)
Chloride: 104 mmol/L (ref 101–111)
Creatinine, Ser: 1.29 mg/dL — ABNORMAL HIGH (ref 0.61–1.24)
GFR calc Af Amer: >60 mL/min (ref >60)
GFR calc non Af Amer: >60 mL/min (ref >60)
GLUCOSE: 149 mg/dL — AB (ref 65–99)
POTASSIUM: 3.5 mmol/L (ref 3.5–5.1)
SODIUM: 136 mmol/L (ref 135–145)
TOTAL PROTEIN: 7 g/dL (ref 6.5–8.1)

## 2017-10-24 LAB — RAPID URINE DRUG SCREEN, HOSP PERFORMED
AMPHETAMINES: NOT DETECTED
BARBITURATES: NOT DETECTED
Benzodiazepines: NOT DETECTED
Cocaine: POSITIVE — AB
Opiates: NOT DETECTED
TETRAHYDROCANNABINOL: POSITIVE — AB

## 2017-10-24 LAB — ACETAMINOPHEN LEVEL: Acetaminophen (Tylenol), Serum: <10 ug/mL — ABNORMAL LOW (ref 10–30)

## 2017-10-24 LAB — I-STAT TROPONIN, ED: TROPONIN I, POC: 0.01 ng/mL (ref 0.00–0.08)

## 2017-10-24 LAB — ETHANOL: Alcohol, Ethyl (B): <10 mg/dL (ref <10)

## 2017-10-24 LAB — SALICYLATE LEVEL: Salicylate Lvl: <7 mg/dL (ref 2.8–30.0)

## 2017-10-24 MED ORDER — ONDANSETRON HCL 4 MG PO TABS: 4.0000 mg | ORAL_TABLET | Freq: Three times a day (TID) | ORAL | Status: DC | PRN

## 2017-10-24 MED ORDER — MOMETASONE FURO-FORMOTEROL FUM 100-5 MCG/ACT IN AERO
2.0000 | INHALATION_SPRAY | Freq: Two times a day (BID) | RESPIRATORY_TRACT | Status: DC
  Administered 2017-10-25: 2 via RESPIRATORY_TRACT
  Filled 2017-10-24: qty 8.8

## 2017-10-24 MED ORDER — EMTRICITABINE-TENOFOVIR AF 200-25 MG PO TABS: 1.0000 | ORAL_TABLET | Freq: Every day | ORAL | Status: DC

## 2017-10-24 MED ORDER — DARUNAVIR-COBICISTAT 800-150 MG PO TABS
1.0000 | ORAL_TABLET | Freq: Every day | ORAL | Status: DC
Start: 2017-10-25 — End: 2017-10-25

## 2017-10-24 MED ORDER — ZOLPIDEM TARTRATE 5 MG PO TABS
5.0000 mg | ORAL_TABLET | Freq: Every evening | ORAL | Status: DC | PRN
  Administered 2017-10-25: 5 mg via ORAL
  Filled 2017-10-24: qty 1

## 2017-10-24 MED ORDER — NICOTINE 21 MG/24HR TD PT24
21.0000 mg | MEDICATED_PATCH | Freq: Every day | TRANSDERMAL | Status: DC
  Administered 2017-10-25: 21 mg via TRANSDERMAL
  Filled 2017-10-24: qty 1

## 2017-10-24 MED ORDER — ALUM & MAG HYDROXIDE-SIMETH 200-200-20 MG/5ML PO SUSP: 30.0000 mL | Freq: Four times a day (QID) | ORAL | Status: DC | PRN

## 2017-10-24 MED ORDER — ACETAMINOPHEN 325 MG PO TABS
650.0000 mg | ORAL_TABLET | ORAL | Status: DC | PRN
  Administered 2017-10-25 (×2): 650 mg via ORAL
  Filled 2017-10-24 (×2): qty 2

## 2017-10-24 NOTE — ED Triage Notes (Signed)
Per Pt, Pt is coming from a [facility] facility where he started to tell employees that "Waking up has been hard. Waking up isn't good anymore." Employees report auditory hallucinations and pt reporting not wanting to wake up.

## 2017-10-24 NOTE — ED Notes (Signed)
Ordered reg tray/NO sharps

## 2017-10-24 NOTE — ED Provider Notes (Signed)
[facility] Provider Note   CSN: [id number] Arrival date & time: 10/24/17  1251     History   Chief Complaint Chief Complaint  Patient presents with  . Suicidal    HPI Curtis Hodge is a 51 y.o. male with a PMHx of HIV, depression, atypical chest pain, HepB, gout, and other conditions listed below, who presents to the ED with complaints of SI with a plan, HI without a plan, and AH for a few months. Pt states he has wanted help but has never received help. Per chart review, pt presented to the Infectious Disease Clinic without an appointment, and reported to Vergia AlbertsSherry Royster [facility] (behavioral health specialist there) that: "Patient stated, "I'm tired of being here".  Patient also stated, "Waking up has problems" and "I don't want to wake up" and reported not wanting to wake up anymore.  In front of the Nurse Manager the patient reported that he was seeking medication from Dr. Daiva EvesVan Dam that could assist with him not waking up anymore.  Patient reported feeling this way for the past three weeks. Patient also made statements referring to everyone talking to him everywhere he goes.  Patient reported [facility] that the voices were [facility] and Facilities managerurse Manager, evidencing auditory hallucinations. Additionally patient reported frequent Marijuana use, with last use yesterday."  Patient states that he has been feeling depressed for quite some time, has suicidal ideations with a plan to jump off of a roof.  He admits that a few days ago he sat on top of the roof of the parking deck and contemplated jumping however he did not do so.  He reports getting angry at times and having homicidal ideations without a specific plan.  He also reports hearing voices.  He admits to using marijuana yesterday, cocaine about 1 month ago, and drinks alcohol infrequently approximately 1 time per week with the last use being 3 days ago when he had "a couple  beers and a couple shots".  He admits to being a cigarette smoker.  He denies visual hallucinations.  He is not currently under the care of any psychiatry team, and is not prescribed any psychiatric medications.  He is requesting help and is here voluntarily.  He has no acute medical complaints at this time, was seen 2 days ago for left-sided chest wall pain which he states has not changed and is reproducible.  He denies any other complaints at this time.   The history is provided by the patient and medical records. No language interpreter was used.  Mental Health Problem  Presenting symptoms: depression, hallucinations, homicidal ideas and suicidal thoughts   Onset quality:  Gradual Timing:  Constant Progression:  Worsening Chronicity:  Recurrent Context: alcohol use, drug abuse and stressful life event   Treatment compliance:  Untreated Relieved by:  None tried Worsened by:  Nothing Ineffective treatments:  None tried Associated symptoms: no abdominal pain and no chest pain   Risk factors: hx of mental illness   Risk factors: no recent psychiatric admission     Past Medical History:  Diagnosis Date  . Atypical chest pain 08/07/2016  . Depression 08/07/2016  . Emphysema/COPD (HCC)   . Gout   . Hepatitis B    HepC  . HIV (human immunodeficiency virus infection) (HCC)   . Incarceration     Patient Active Problem List   Diagnosis Date Noted  . Atypical chest pain 08/07/2016  .  Depression 08/07/2016  . HIV disease (HCC) 02/09/2015  . Bullous emphysema (HCC) 02/09/2015  . Musculoskeletal chest pain 02/09/2015  . Falling asleep and waking up too early in the evening 04/26/2014  . Sore throat 04/26/2014  . Sciatic pain 08/30/2013  . Low testosterone 08/30/2013  . Hemorrhoid 08/30/2013  . Blurry vision, bilateral 01/05/2013  . Lumbago 01/05/2013  . Weight loss 07/22/2012  . Face lacerations 07/22/2012  . Incarceration   . Chronic hepatitis B without delta agent without hepatic  coma (HCC) 07/11/2010  . CANNABIS ABUSE, EPISODIC 07/11/2010  . SMOKER 07/11/2010  . HIV INFECTION 06/26/2010    Past Surgical History:  Procedure Laterality Date  . AMPUTATION Right 02/04/2017   Procedure: RIGHT SMALL FINGER RAY AMPUTATION;  Surgeon: [name], MD;  Location: [facility];  Service: Orthopedics;  Laterality: Right;  . FLEXOR TENDON REPAIR Right 02/27/2016   Procedure: RIGHT SMALL FINGER FLEXOR TENDON REPAIR;  Surgeon: [name], MD;  Location: [facility];  Service: Orthopedics;  Laterality: Right;  . I&D EXTREMITY  05/21/2012   Procedure: IRRIGATION AND DEBRIDEMENT EXTREMITY;  Surgeon: [name], MD;  Location: [facility] OR;  Service: Orthopedics;  Laterality: Right;       Home Medications    Prior to Admission medications   Medication Sig Start Date End Date Taking? Authorizing Provider  acetaminophen (TYLENOL) 325 MG tablet Take 2 tablets (650 mg total) by mouth every 6 (six) hours as needed for mild pain or moderate pain. 02/04/17   [name], MD  darunavir-cobicistat (PREZCOBIX) 800-150 MG tablet Take 1 tablet by mouth daily. 08/21/16   Randall Hiss, MD  emtricitabine-tenofovir AF (DESCOVY) 200-25 MG tablet Take 1 tablet by mouth daily. 08/21/16   Randall Hiss, MD  furosemide (LASIX) 20 MG tablet Take 1 tablet (20 mg total) by mouth daily. 03/08/17   Robinson, [place] N, PA-C  ibuprofen (ADVIL,MOTRIN) 800 MG tablet Take 1 tablet (800 mg total) by mouth 3 (three) times daily. 02/23/17   Palumbo, April, MD  mometasone-formoterol (DULERA) 100-5 MCG/ACT AERO Inhale 2 puffs into the lungs 2 (two) times daily. 10/07/16   Randall Hiss, MD  traMADol (ULTRAM) 50 MG tablet Take 1 tablet (50 mg total) by mouth every 6 (six) hours as needed. 10/23/17   Gilda Crease, MD    Family History No family history on file.  Social History Social History   Tobacco Use  . Smoking status: Current Every Day Smoker     Packs/day: 0.50    Types: Cigarettes    Last attempt to quit: 05/02/2016    Years since quitting: 1.4  . Smokeless tobacco: Never Used  Substance Use Topics  . Alcohol use: Yes    Comment: Weekends.   . Drug use: Yes    Frequency: 5.0 times per week    Types: Marijuana    Comment: Yesterday      Allergies   Patient has no known allergies.   Review of Systems Review of Systems  Constitutional: Negative for chills and fever.  Respiratory: Negative for shortness of breath.   Cardiovascular: Negative for chest pain.  Gastrointestinal: Negative for abdominal pain, constipation, diarrhea, nausea and vomiting.  Genitourinary: Negative for dysuria and hematuria.  Musculoskeletal: Negative for arthralgias and myalgias.  Skin: Negative for color change.  Allergic/Immunologic: Positive for immunocompromised state (HIV).  Neurological: Negative for weakness and numbness.  Psychiatric/Behavioral: Positive for hallucinations, homicidal ideas and suicidal ideas. Negative for confusion.  All other systems reviewed and are negative for acute change except as noted in the HPI.    Physical Exam Updated Vital Signs BP 103/76 (BP Location: Right Arm)   Pulse 76   Temp 98 F (36.7 C)   Resp 14   Ht 5\' 8"  (1.727 m)   Wt 72.6 kg (160 lb)   SpO2 100%   BMI 24.33 kg/m   Physical Exam  Constitutional: He is oriented to person, place, and time. Vital signs are normal. He appears well-developed and well-nourished.  Non-toxic appearance. No distress.  Afebrile, nontoxic, NAD  HENT:  Head: Normocephalic and atraumatic.  Mouth/Throat: Oropharynx is clear and moist and mucous membranes are normal.  Eyes: Conjunctivae and EOM are normal. Right eye exhibits no discharge. Left eye exhibits no discharge.  Neck: Normal range of motion. Neck supple.  Cardiovascular: Normal rate, regular rhythm, normal heart sounds and intact distal pulses. Exam reveals no gallop and no friction rub.  No murmur  heard. Pulmonary/Chest: Effort normal and breath sounds normal. No respiratory distress. He has no decreased breath sounds. He has no wheezes. He has no rhonchi. He has no rales.     He exhibits tenderness. He exhibits no crepitus, no deformity and no retraction.  CTAB in all lung fields, no w/r/r, no hypoxia or increased WOB, speaking in full sentences, SpO2 100% on RA Chest wall with mild TTP to L posterior ribs, without crepitus, deformities, or retractions   Abdominal: Soft. Normal appearance and bowel sounds are normal. He exhibits no distension. There is no tenderness. There is no rigidity, no rebound, no guarding, no CVA tenderness, no tenderness at McBurney's point and negative Murphy's sign.  Musculoskeletal: Normal range of motion.  Neurological: He is alert and oriented to person, place, and time. He has normal strength. No sensory deficit.  Skin: Skin is warm, dry and intact. No rash noted.  Psychiatric: His speech is tangential. He is actively hallucinating. He exhibits a depressed mood. He expresses homicidal and suicidal ideation. He expresses suicidal plans. He expresses no homicidal plans.  Depressed affect, but pleasant and cooperative. Endorsing SI with a plan and HI without a plan, reports auditory hallucinations without visual hallucinations, doesn't seem to be responding to internal stimuli. Somewhat tangential  Nursing note and vitals reviewed.    ED Treatments / Results  Labs (all labs ordered are listed, but only abnormal results are displayed) Labs Reviewed  COMPREHENSIVE METABOLIC PANEL - Abnormal; Notable for the following components:      Result Value   Glucose, Bld 149 (*)    Creatinine, Ser 1.29 (*)    Calcium 8.6 (*)    Albumin 3.1 (*)    AST 79 (*)    ALT 84 (*)    All other components within normal limits  ACETAMINOPHEN LEVEL - Abnormal; Notable for the following components:   Acetaminophen (Tylenol), Serum <10 (*)    All other components within  normal limits  CBC - Abnormal; Notable for the following components:   WBC 3.0 (*)    RBC 4.01 (*)    HCT 38.5 (*)    Platelets 119 (*)    All other components within normal limits  RAPID URINE DRUG SCREEN, HOSP PERFORMED - Abnormal; Notable for the following components:   Cocaine POSITIVE (*)    Tetrahydrocannabinol POSITIVE (*)    All other components within normal limits  ETHANOL  SALICYLATE LEVEL  I-STAT TROPONIN, ED    EKG  EKG Interpretation None  Radiology No results found.  Dg Chest 2 View  Result Date: 10/22/2017 CLINICAL DATA:  Shortness of breath, LEFT chest pain, LEFT jaw and axillary pain with tingling in the fingers of LEFT hand for 1 week, history HIV, emphysema, COPD EXAM: CHEST  2 VIEW COMPARISON:  08/13/2017 FINDINGS: Normal heart size, mediastinal contours, and pulmonary vascularity. Emphysematous changes with severe bullous disease of RIGHT greater than LEFT upper lobes. Increased markings at RIGHT base since previous exam could represent atelectasis or developing infiltrate. Remaining lungs clear. No pleural effusion or pneumothorax. Osseous structures unremarkable. IMPRESSION: Severe bullous COPD changes with increased opacity at the RIGHT base question increased atelectasis versus developing early infiltrate. Electronically Signed   By: Ulyses Southward M.D.   On: 10/22/2017 20:31     Procedures Procedures (including critical care time)  Medications Ordered in ED Medications  darunavir-cobicistat (PREZCOBIX) 800-150 MG per tablet 1 tablet (not administered)  emtricitabine-tenofovir AF (DESCOVY) 200-25 MG per tablet 1 tablet (not administered)  mometasone-formoterol (DULERA) 100-5 MCG/ACT inhaler 2 puff (not administered)  acetaminophen (TYLENOL) tablet 650 mg (not administered)  zolpidem (AMBIEN) tablet 5 mg (not administered)  ondansetron (ZOFRAN) tablet 4 mg (not administered)  alum & mag hydroxide-simeth (MAALOX/MYLANTA) 200-200-20 MG/5ML suspension  30 mL (not administered)  nicotine (NICODERM CQ - dosed in mg/24 hours) patch 21 mg (not administered)     Initial Impression / Assessment and Plan / ED Course  I have reviewed the triage vital signs and the nursing notes.  Pertinent labs & imaging results that were available during my care of the patient were reviewed by me and considered in my medical decision making (see chart for details).     51 y.o. male here for SI with a plan, HI without a plan, and AH; admits to [facility] use and occasional cocaine use; also occasional EtOH use. +Tobacco user. No visual hallucinations. Not on psych meds, not under care of psychiatry, requesting help and here voluntarily. Was seen 2 days ago for some chest wall pain, which is reproducible in the L posterior ribs, clear lung exam today, and no acute medical complaints at this time. Work up 2 days ago was reassuring. Exam today fairly benign aside from tangential speech and depressed mood; labs today show: CBC with stable thrombocytopenia and low WBC. CMP with baseline Cr and marginally elevated LFTs similar to prior. EtOH level undetectable. Salicylate and acetaminophen levels WNL. UDS with +cocaine and THC, but otherwise neg. Pt medically cleared at this time. Psych hold orders and home med orders placed. Please see TTS notes for further documentation of care/dispo. PLEASE NOTE THAT PT IS HERE VOLUNTARILY AT THIS TIME, IF PT TRIES TO LEAVE THEY WOULD NEED IVC PAPERWORK TAKEN OUT. Pt stable at time of med clearance.     Final Clinical Impressions(s) / ED Diagnoses   Final diagnoses:  Suicidal ideation  Polysubstance abuse (HCC)  Thrombocytopenia (HCC)  Medical clearance for psychiatric admission  Alcohol use  Tobacco user  Auditory hallucinations  Homicidal ideation    ED Discharge Orders    [address] [place], [place] 10/24/17 2212    Vanetta Mulders, MD 10/25/17 1529

## 2017-10-24 NOTE — BH Specialist Note (Signed)
Integrated Behavioral Health Initial Visit  MRN: [id number] Name: Jacqualine MauLeonard R Cerutti  Number of Integrated Behavioral Health Clinician visits:: 1/6 Session Start time: 12:19 pm  Session End time: 1:00 pm Total time: 40 minutes  Type of Service: Integrated Behavioral Health- Individual/Family Interpretor:No. Interpretor Name and Language: N/A  SUBJECTIVE: Jacqualine MauLeonard R Decoteau is a 51 y.o. male accompanied by self Patient walked in the office without an appointment but is a patient of Dr. Daiva EvesVan Dam.  Patient stated, "I'm tired of being here".  Patient also stated, "Waking up has problems" and "I don't want to wake up" and reported not wanting to wake up anymore.  In front of the Nurse Manager the patient reported that he was seeking medication from Dr. Daiva EvesVan Dam that could assist with him not waking up anymore.  Patient reported feeling this way for the past three weeks. Patient also made statements referring to everyone talking to him everywhere he goes.  [facility] that the voices were telling him not [facility] and Facilities managerurse Manager, evidencing auditory hallucinations. Additionally patient reported frequent Marijuana use, with last use yesterday.  Patient reported lack of social support and presented as a poor historian in reporting where he was living.  Patient had inconsistent statements that he was living with his mother but hasn't seen his mother.  Duration of problem: past three weeks; Severity of problem: moderate  OBJECTIVE: Mood: Depressed and Affect: Appropriate Risk of harm to self or others: Suicidal ideation Suicide plan to take medications to overdose  Thought process: circumstantial and perseveration (repeating the same statements) Thought content: auditory hallucinations  ASSESSMENT: Patient is currently experiencing suicidal ideations with plan to overdose and auditory hallucinations.  Patient may benefit from psychiatric evaluation and observation  in the emergency department.  GOALS ADDRESSED: Patient will: 1. Reduce symptoms of: depression and thoughts of self-harm 2. Increase knowledge and/or ability of: coping skills, healthy habits and stress reduction  3. Demonstrate ability to: Increase healthy adjustment to current life circumstances, Increase adequate support systems for patient/family and Increase desire to live  INTERVENTIONS: Interventions utilized: Supportive Counseling and Link to [facility]  PLAN: 1. Research officer, political partyractice Administrator contacted Security. 2.  [facility]. 3. Patient will receive psychiatric evaluation and safety assessment.   Vergia AlbertsSherry Rufino Staup, [facility]

## 2017-10-24 NOTE — ED Notes (Signed)
Pt called for various times by pharmacy in the lobby. Pt has also been called x3 by this EMT for vital sign reassessment. No answer.

## 2017-10-25 ENCOUNTER — Inpatient Hospital Stay (HOSPITAL_COMMUNITY)
Admission: AD | Admit: 2017-10-25 | Discharge: 2017-10-31 | DRG: 881 | Disposition: A | Discharge status: Home / Self Care | Payer: Medicaid Other | Source: Intra-hospital | Attending: Psychiatry | Admitting: Psychiatry

## 2017-10-25 ENCOUNTER — Encounter (HOSPITAL_COMMUNITY): Payer: Self-pay | Admitting: *Deleted

## 2017-10-25 ENCOUNTER — Other Ambulatory Visit: Payer: Self-pay

## 2017-10-25 DIAGNOSIS — Z9119 Patient's noncompliance with other medical treatment and regimen: Secondary | ICD-10-CM | POA: Diagnosis not present

## 2017-10-25 DIAGNOSIS — Z89021 Acquired absence of right finger(s): Secondary | ICD-10-CM | POA: Diagnosis not present

## 2017-10-25 DIAGNOSIS — F17210 Nicotine dependence, cigarettes, uncomplicated: Secondary | ICD-10-CM | POA: Diagnosis not present

## 2017-10-25 DIAGNOSIS — F329 Major depressive disorder, single episode, unspecified: Secondary | ICD-10-CM | POA: Diagnosis not present

## 2017-10-25 DIAGNOSIS — R450 Nervousness: Secondary | ICD-10-CM | POA: Diagnosis not present

## 2017-10-25 DIAGNOSIS — J439 Emphysema, unspecified: Secondary | ICD-10-CM | POA: Diagnosis present

## 2017-10-25 DIAGNOSIS — F1910 Other psychoactive substance abuse, uncomplicated: Secondary | ICD-10-CM | POA: Diagnosis not present

## 2017-10-25 DIAGNOSIS — F1210 Cannabis abuse, uncomplicated: Secondary | ICD-10-CM | POA: Diagnosis not present

## 2017-10-25 DIAGNOSIS — B20 Human immunodeficiency virus [HIV] disease: Secondary | ICD-10-CM | POA: Diagnosis present

## 2017-10-25 DIAGNOSIS — R440 Auditory hallucinations: Secondary | ICD-10-CM | POA: Diagnosis not present

## 2017-10-25 DIAGNOSIS — Z23 Encounter for immunization: Secondary | ICD-10-CM

## 2017-10-25 DIAGNOSIS — Z560 Unemployment, unspecified: Secondary | ICD-10-CM | POA: Diagnosis not present

## 2017-10-25 DIAGNOSIS — F419 Anxiety disorder, unspecified: Secondary | ICD-10-CM | POA: Diagnosis not present

## 2017-10-25 DIAGNOSIS — R45851 Suicidal ideations: Secondary | ICD-10-CM | POA: Diagnosis not present

## 2017-10-25 DIAGNOSIS — F39 Unspecified mood [affective] disorder: Secondary | ICD-10-CM | POA: Diagnosis not present

## 2017-10-25 DIAGNOSIS — F1414 Cocaine abuse with cocaine-induced mood disorder: Secondary | ICD-10-CM | POA: Diagnosis not present

## 2017-10-25 DIAGNOSIS — F332 Major depressive disorder, recurrent severe without psychotic features: Secondary | ICD-10-CM | POA: Diagnosis not present

## 2017-10-25 DIAGNOSIS — F1994 Other psychoactive substance use, unspecified with psychoactive substance-induced mood disorder: Secondary | ICD-10-CM | POA: Diagnosis not present

## 2017-10-25 DIAGNOSIS — J449 Chronic obstructive pulmonary disease, unspecified: Secondary | ICD-10-CM | POA: Diagnosis not present

## 2017-10-25 DIAGNOSIS — F1490 Cocaine use, unspecified, uncomplicated: Secondary | ICD-10-CM | POA: Diagnosis not present

## 2017-10-25 MED ORDER — ALUM & MAG HYDROXIDE-SIMETH 200-200-20 MG/5ML PO SUSP: 30.0000 mL | Freq: Four times a day (QID) | ORAL | Status: DC | PRN

## 2017-10-25 MED ORDER — MOMETASONE FURO-FORMOTEROL FUM 100-5 MCG/ACT IN AERO
2.0000 | INHALATION_SPRAY | Freq: Two times a day (BID) | RESPIRATORY_TRACT | Status: DC
  Administered 2017-10-25 – 2017-10-31 (×12): 2 via RESPIRATORY_TRACT

## 2017-10-25 MED ORDER — PNEUMOCOCCAL VAC POLYVALENT 25 MCG/0.5ML IJ INJ
0.5000 mL | INJECTION | INTRAMUSCULAR | Status: AC
  Administered 2017-10-26: 0.5 mL via INTRAMUSCULAR

## 2017-10-25 MED ORDER — TRAZODONE HCL 50 MG PO TABS
50.0000 mg | ORAL_TABLET | Freq: Every evening | ORAL | Status: DC | PRN
  Administered 2017-10-25: 50 mg via ORAL
  Filled 2017-10-25: qty 1

## 2017-10-25 MED ORDER — HYDROXYZINE HCL 25 MG PO TABS
25.0000 mg | ORAL_TABLET | Freq: Three times a day (TID) | ORAL | Status: DC | PRN
  Administered 2017-10-26 – 2017-10-30 (×6): 25 mg via ORAL
  Filled 2017-10-25 (×2): qty 1
  Filled 2017-10-25: qty 10
  Filled 2017-10-25 (×4): qty 1

## 2017-10-25 MED ORDER — ACETAMINOPHEN 325 MG PO TABS
650.0000 mg | ORAL_TABLET | ORAL | Status: DC | PRN
  Administered 2017-10-26: 650 mg via ORAL
  Filled 2017-10-25: qty 2

## 2017-10-25 MED ORDER — INFLUENZA VAC SPLIT QUAD 0.5 ML IM SUSY
0.5000 mL | PREFILLED_SYRINGE | INTRAMUSCULAR | Status: AC
  Administered 2017-10-26: 0.5 mL via INTRAMUSCULAR
  Filled 2017-10-25: qty 0.5

## 2017-10-25 MED ORDER — ACETAMINOPHEN 325 MG PO TABS: 650.0000 mg | ORAL_TABLET | Freq: Four times a day (QID) | ORAL | Status: DC | PRN

## 2017-10-25 MED ORDER — NICOTINE 21 MG/24HR TD PT24
21.0000 mg | MEDICATED_PATCH | Freq: Every day | TRANSDERMAL | Status: DC
  Administered 2017-10-26 – 2017-10-31 (×6): 21 mg via TRANSDERMAL
  Filled 2017-10-25 (×10): qty 1

## 2017-10-25 MED ORDER — MAGNESIUM HYDROXIDE 400 MG/5ML PO SUSP: 30.0000 mL | Freq: Every day | ORAL | Status: DC | PRN

## 2017-10-25 MED ORDER — ALUM & MAG HYDROXIDE-SIMETH 200-200-20 MG/5ML PO SUSP: 30.0000 mL | ORAL | Status: DC | PRN

## 2017-10-25 NOTE — BH Assessment (Signed)
Pt reports he ate breakfast and he slept fairly well. Pt continues to endorse SI. He says that he "still didn't want to wake up". He is referring to wishing he had died in his sleep. He denies HI. Pt says he sometimes hears voices. When asked if anyone is out to get him, pt says, his family and people [facility] the [facility] laugh at him and call him a failure.  Evette Cristalaroline Paige Evaleigh Mccamy, [place] Therapeutic Triage Specialist

## 2017-10-25 NOTE — ED Notes (Signed)
Pt voiced agreement w/tx plan. Signed consent forms - [facility], copy sent to [facility] Records, and [facility].

## 2017-10-25 NOTE — ED Notes (Signed)
Pt ambulatory to [facility]
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
## 2017-10-25 NOTE — Progress Notes (Signed)
[facility]. Report #[phone]  Ilean SkillMeghan Takyla Kuchera, MSW, LCSW Clinical Social Work 10/25/2017 [phone]

## 2017-10-25 NOTE — Tx Team (Signed)
Initial Treatment Plan 10/25/2017 4:15 PM Curtis SinkLeonard Ray Calaway ZOX:[id number]    PATIENT STRESSORS: Financial difficulties Health problems Substance abuse   PATIENT STRENGTHS: Ability for insight Average or above average intelligence Capable of independent living General fund of knowledge Motivation for treatment/growth   PATIENT IDENTIFIED PROBLEMS: Depression Substance Abuse Suicidal thoughts "I don't have nobody to talk to" "I'm a held down guy and tired of being held down" "Tired of people laughing at me"                     DISCHARGE CRITERIA:  Ability to meet basic life and health needs Improved stabilization in mood, thinking, and/or behavior Reduction of life-threatening or endangering symptoms to within safe limits Verbal commitment to aftercare and medication compliance  PRELIMINARY DISCHARGE PLAN: Attend aftercare/continuing care group Return to previous living arrangement  PATIENT/FAMILY INVOLVEMENT: This treatment plan has been presented to and reviewed with the patient, Curtis Hodge, and/or family member, .  The patient and family have been given the opportunity to ask questions and make suggestions.  Curtis Hodge, Curtis Hodge, [place]RN 10/25/2017, 4:15 PM

## 2017-10-25 NOTE — ED Notes (Signed)
Per Pharmacist, pt has not been on HIV meds in a year and per note, pt is to have labs drawn prior to restarting.
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
## 2017-10-25 NOTE — Progress Notes (Signed)
Patient attended group and said that his day was a 3. He had no goal for today but hopes that he will be able to see the physician tomorrow.

## 2017-10-26 DIAGNOSIS — F1910 Other psychoactive substance abuse, uncomplicated: Secondary | ICD-10-CM

## 2017-10-26 DIAGNOSIS — F17210 Nicotine dependence, cigarettes, uncomplicated: Secondary | ICD-10-CM

## 2017-10-26 DIAGNOSIS — F332 Major depressive disorder, recurrent severe without psychotic features: Secondary | ICD-10-CM

## 2017-10-26 DIAGNOSIS — R440 Auditory hallucinations: Secondary | ICD-10-CM

## 2017-10-26 DIAGNOSIS — F419 Anxiety disorder, unspecified: Secondary | ICD-10-CM

## 2017-10-26 DIAGNOSIS — R45851 Suicidal ideations: Secondary | ICD-10-CM

## 2017-10-26 DIAGNOSIS — F1490 Cocaine use, unspecified, uncomplicated: Secondary | ICD-10-CM

## 2017-10-26 DIAGNOSIS — F1994 Other psychoactive substance use, unspecified with psychoactive substance-induced mood disorder: Secondary | ICD-10-CM

## 2017-10-26 DIAGNOSIS — R450 Nervousness: Secondary | ICD-10-CM

## 2017-10-26 LAB — TSH: TSH: 2.534 u[IU]/mL (ref 0.350–4.500)

## 2017-10-26 MED ORDER — QUETIAPINE FUMARATE 100 MG PO TABS: 100.0000 mg | ORAL_TABLET | Freq: Every day | ORAL | Status: DC

## 2017-10-26 MED ORDER — QUETIAPINE FUMARATE ER 50 MG PO TB24: 50.0000 mg | ORAL_TABLET | Freq: Two times a day (BID) | ORAL | Status: DC

## 2017-10-26 MED ORDER — OLANZAPINE 10 MG PO TABS
10.0000 mg | ORAL_TABLET | Freq: Every day | ORAL | Status: DC
  Administered 2017-10-26 – 2017-10-30 (×5): 10 mg via ORAL
  Filled 2017-10-26 (×2): qty 7
  Filled 2017-10-26: qty 1
  Filled 2017-10-26: qty 7
  Filled 2017-10-26 (×6): qty 1

## 2017-10-26 MED ORDER — OLANZAPINE 5 MG PO TABS
5.0000 mg | ORAL_TABLET | Freq: Every day | ORAL | Status: DC
  Filled 2017-10-26: qty 1

## 2017-10-26 NOTE — Progress Notes (Signed)
D: Pt denies SI/HI. Patient denies any A/V hallucinations at this time, but states he has heard voices prior to admission that were laughing at him. Pt  Expressed he is helpless and has no where to turn and that he is off his HIV medications and needs to be back on them. He spoke of relationship problems and that he has no support from his family.  Pt  Did no have a goal for today; but is looking forward to talking with the doctor tomorrow.  A: Pt was offered support and encouragement. Pt was given scheduled medications. Pt was encourage to attend groups. Q 15 minute checks were done for safety.  R:Pt attends groups and interacts well with peers and staff. Pt is taking medication. Pt has no complaints.Pt receptive to treatment and safety maintained on unit.

## 2017-10-26 NOTE — [facility] Group Notes (Signed)
[facility] Group Notes:  Skills Group  Date:  10/26/2017  Time:  4:48 PM  Type of Therapy:  Psychoeducational Skills  Participation Level:  Active  Participation Quality:  Appropriate and Attentive  Affect:  Anxious  Cognitive:  Appropriate  Insight:  Limited  Engagement in Group:  Engaged  Modes of Intervention:  Activity, Discussion and Education  Summary of Progress/Problems: Patient attended group and was engaged however did leave to meet with physician.   Marzetta BoardDopson, Notnamed Croucher E 10/26/2017, 4:48 PM
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
## 2017-10-26 NOTE — Progress Notes (Signed)
Patient ID: Curtis Hodge, male   DOB: 1966/02/04, 51 y.o.   MRN: [id number]  DAR: Pt. Denies SI/HI and A/V Hallucinations. Patient does not report any pain or discomfort at this time. He reports the "squeezing" in his chest area has subsided. He reports, "my blood pressure is never high. I don't use cocaine like that." Patient is currently minimizing his cocaine use. He got his blood drawn this evening and tolerated well. He states, "I hate needles." He is anxious in affect and mood. Support and encouragement provided to the patient. He is seen in the dayroom interacting with his peers and playing cards this evening. Q15 minute checks are maintained for safety.

## 2017-10-26 NOTE — [facility] Suicide Risk Assessment (Signed)
[facility]   Nursing information obtained from:    Demographic factors:    Current Mental Status:    Loss Factors:    Historical Factors:    Risk Reduction Factors:     Total Time spent with patient: 30 minutes Principal Problem: MDD (major depressive disorder) Diagnosis:   Patient Active Problem List   Diagnosis Date Noted  . MDD (major depressive disorder) [F32.9] 10/25/2017  . Atypical chest pain [R07.89] 08/07/2016  . Depression [F32.9] 08/07/2016  . HIV disease (HCC) [B20] 02/09/2015  . Bullous emphysema (HCC) [J43.9] 02/09/2015  . Musculoskeletal chest pain [R07.89] 02/09/2015  . Falling asleep and waking up too early in the evening [G47.00] 04/26/2014  . Sore throat [J02.9] 04/26/2014  . Sciatic pain [M54.30] 08/30/2013  . Low testosterone [R79.89] 08/30/2013  . Hemorrhoid [K64.9] 08/30/2013  . Blurry vision, bilateral [H53.8] 01/05/2013  . Lumbago [M54.5] 01/05/2013  . Weight loss [R63.4] 07/22/2012  . Face lacerations [S01.81XA] 07/22/2012  . Incarceration [Z65.1]   . Chronic hepatitis B without delta agent without hepatic coma (HCC) [B18.1] 07/11/2010  . CANNABIS ABUSE, EPISODIC [F12.20] 07/11/2010  . SMOKER [F17.200] 07/11/2010  . HIV INFECTION [B20] 06/26/2010   Subjective Data:  51 y.o  AAM lives with his mother, unemployed, no income, no kids. Background history of SUD, mood disorder and multiple medical comorbidity. Presented to the ER on account of chest pain. Intoxicated with cocaine and THC. Expressed hallucinations. Expressed suicidal thoughts. He contemplated jumping off a height a couple of days ago. Routine labs significant for decreased CD4 and Helper T cell count. Liver enzymes are mildly impaired, toxicology is negative. UDS is positive for cocaine and THC. No alcohol.  Patient reports being frustrated with the way his life is going. Says he applied for disability but has been denied thrice. He has a hearing for disability in  February. Patient says that him mother expects him to give her any money he earns. Says he has been frustrated with the way they make him feel. Says he has been feeling hopeless and worthless. Says he did not even realize he was sitting at a parking deck a couple of days ago. Patient reports mood swings and irritability. He has transient episodes of auditory hallucinations. He is not having any hallucination lately. No delusional preoccupation. Patient denies any suicidal thoughts currently. He is focused on getting his life back on track. Says he has been off his HIV medications for over ten months. He wants to get back on them. Single epsiode of OD years ago. No past history of violent behavior. Limited insight into the role of illicit substances. Sleep wake dysregulation likely related to effects of cocaine. We have agreed to use Olanzapine to target the effects of cocaine. He consented to treatment after we reviewed the risks and benefits. We would consult ID to advised on HIV treatment.      Continued Clinical Symptoms:  Alcohol Use Disorder Identification Test Final Score (AUDIT): 2 The "Alcohol Use Disorders Identification Test", Guidelines for Use in Primary Care, Second Edition.  World Science writerHealth Organization [facility](WHO). Score between 0-7:  no or low risk or alcohol related problems. Score between 8-15:  moderate risk of alcohol related problems. Score between 16-19:  high risk of alcohol related problems. Score 20 or above:  warrants further diagnostic evaluation for alcohol dependence and treatment.   CLINICAL FACTORS:   Depression:   Hopelessness Alcohol/Substance Abuse/Dependencies   Musculoskeletal: Strength & Muscle Tone: within normal limits Gait &  Station: normal Patient leans: N/A  Psychiatric Specialty Exam: Physical Exam  Constitutional: He appears well-developed and well-nourished.  HENT:  Head: Normocephalic and atraumatic.  Respiratory: Effort normal.  Neurological: He is  alert.  Psychiatric:  As above.     ROS  Blood pressure (!) 164/99, pulse 80, temperature 98.9 F (37.2 C), temperature source Oral, resp. rate 18, height 5\' 8"  (1.727 m), weight 69.9 kg (154 lb), SpO2 100 %.Body mass index is 23.42 kg/m.  General Appearance: Casually dressed. Was hyperventilating initially. Calmed down with time. Not internally distracted.   Eye Contact:  Good  Speech:  Spontaneous, normal prosody. Normal tone and rate.   Volume:  Normal  Mood:  Depressed and Irritable  Affect:  Congruent  Thought Process: circumstantial   Orientation:  Full (Time, Place, and Person)  Thought Content:  Rumination  Suicidal Thoughts:  No  Homicidal Thoughts:  No  Memory:  Immediate;   Good Recent;   Good Remote;   Good  Judgement:  Fair  Insight:   Limited   Psychomotor Activity:  Normal  Concentration:  Concentration: Fair and Attention Span: Fair  Recall:  Good  Fund of Knowledge:  Good  Language:  Good  Akathisia:  Negative  Handed:    AIMS (if indicated):     Assets:  Communication Skills Desire for Improvement Resilience  ADL's:  Intact  Cognition:  WNL  Sleep:  Number of Hours: 3.75      COGNITIVE FEATURES THAT CONTRIBUTE TO RISK:  Closed-mindedness    SUICIDE RISK:   Moderate:  Frequent suicidal ideation with limited intensity, and duration, some specificity in terms of plans, no associated intent, good self-control, limited dysphoria/symptomatology, some risk factors present, and identifiable protective factors, including available and accessible social support.  PLAN OF CARE:  1. Suicide precautions 2. ID consult tomorrow 3. Olanzapine 10 mg HS 4. Monitor mood, behavior and interaction with peers. 5. Encourage unit groups and therapeutic activities.  6. SW would gather collateral from his family    I certify that inpatient services furnished can reasonably be expected to improve the patient's condition.   Georgiann CockerVincent A Jadea Shiffer, MD 10/26/2017, 1:39  PM

## 2017-10-26 NOTE — [facility] Group Notes (Signed)
[facility]  Date/Time:  10/26/2017 10:00-11:00AM  Type of Therapy and Topic:  Group Therapy:  Healthy and Unhealthy Supports  Participation Level:  Active   Description of Group:  Patients in this group were introduced to the idea of adding a variety of healthy supports to address the various needs in their lives. Patients identified and discussed what additional healthy supports could be helpful in their recovery and wellness after discharge in order to prevent future hospitalizations.   An emphasis was placed on using counselor, doctor, therapy groups, 12-step groups, and problem-specific support groups to expand supports.   Therapeutic Goals:   1)  discuss importance of adding supports to stay well once out of the hospital  2)  compare healthy versus unhealthy supports and identify some examples of each  3)  generate ideas and descriptions of healthy supports that can be added  4)  offer mutual support about how to address unhealthy supports  5)  encourage active participation in and adherence to discharge plan    Summary of Patient Progress:  The patient participated fully in the discussion and expressed that he often does not feel understood, was supportive of others.  He specifically said that he feels that people cannot understand his drug addiction issues if they have not experienced drug addiction.   Therapeutic Modalities:   Motivational Interviewing Brief Solution-Focused Therapy  Ambrose MantleMareida Grossman-Orr, LCSW 10/26/2017, 11:00AM

## 2017-10-26 NOTE — Progress Notes (Signed)
D) Pt up this am and talkative. Affect, tense and mood depressed. States he has complained of pain in her chest for several days. "Feels as though my lung is on fire and no one is doing anything about it". Interacts with his peers and is pleasant with interaction. Rates his depression at a 4, hopelessness at an 8 and anxiety at a 5. Denies SI and HI A) Given support and reassurance. Provided with a 1:1. EKG completed and given to the NP. Encouraged to attend the program and to participate fully. D) Pt denies SI and HI.
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
## 2017-10-27 NOTE — Progress Notes (Signed)
D: Patient has pleasant mood; he is lethargic and drowsy.  His mood appears depressed and anxious.  Patient is frustrated because he is attempting to get disability.  Patient has been told he cannot return home after January 2019 unless he has a job.  Patient also states health issues are a stressor for him.  He denies any thoughts of self harm today.  His affect is flat, blunted; his mood depressed.  A: Continue to monitor medication management and MD orders.  Safety checks completed every 15 minutes per protocol.  Offer support and encouragement as needed.  R: Patient is receptive to staff; he is interacting well with his peers.  He attended group today and his behavior is appropriate.

## 2017-10-27 NOTE — Progress Notes (Signed)
Patient states that he had a good day overall since his blood pressure was within normal limits. In addition, he talked about the fact that he had a conversation with his girlfriend today. He also mentioned that he enjoyed getting some exercise and socialized with his peers. He was proud of the fact that he was able to make some of his peers smile today. As for the theme of the day, his wellness strategy will include wearing his nicotine patch as a way of cutting back on his smoking.

## 2017-10-27 NOTE — Plan of Care (Signed)
  Progressing Activity: Interest or engagement in activities will improve 10/27/2017 1606 - Progressing by Angela AdamBeaudry, Zoei Amison E, RN Education: Emotional status will improve 10/27/2017 1606 - Progressing by Angela AdamBeaudry, Vinicius Brockman E, RN Mental status will improve 10/27/2017 1606 - Progressing by Angela AdamBeaudry, Harlyn Rathmann E, RN

## 2017-10-27 NOTE — Progress Notes (Signed)
[facility]  10/27/2017 12:46 PM Murtis SinkLeonard Ray Shimp  MRN:  [id number] Subjective:  51 y.o  AAM lives with his mother, unemployed, no income, no kids. Background history of SUD, mood disorder and multiple medical comorbidity. Presented to the ER on account of chest pain. Intoxicated with cocaine and THC. Expressed hallucinations. Expressed suicidal thoughts. He contemplated jumping off a height a couple of days ago. Routine labs significant for decreased CD4 and Helper T cell count. Liver enzymes are mildly impaired, toxicology is negative. UDS is positive for cocaine and THC. No alcohol.   Chart reviewed today. Patient discussed at team today.  Staff reports that he has been less somatically focused. He reports that he would not be allowed back home unless he has some sort of income. Reports being overwhelmed by inability to get disability. He has not voiced any suicidal or homicidal thoughts. He is still coming off cocaine. No internal stimulation.   Seen today. Says he is feeling a lot better. No chest pain today. Role of cocaine was discussed. Patient used a lot of displacement and denial to cope. Says he spoke with is mother today. Says he could go back to his mother's place. Says they just realized they were putting a lot of pressure on him. Patient denies any suicidal thoughts today. Tells me that he slept well last night. No abnormal perception. No delusional theme.   Principal Problem: Substance induced mood disorder (HCC) Diagnosis:   Patient Active Problem List   Diagnosis Date Noted  . Substance induced mood disorder (HCC) [F19.94] 10/26/2017  . MDD (major depressive disorder) [F32.9] 10/25/2017  . Atypical chest pain [R07.89] 08/07/2016  . Depression [F32.9] 08/07/2016  . HIV disease (HCC) [B20] 02/09/2015  . Bullous emphysema (HCC) [J43.9] 02/09/2015  . Musculoskeletal chest pain [R07.89] 02/09/2015  . Falling asleep and waking up too early in the evening [G47.00]  04/26/2014  . Sore throat [J02.9] 04/26/2014  . Sciatic pain [M54.30] 08/30/2013  . Low testosterone [R79.89] 08/30/2013  . Hemorrhoid [K64.9] 08/30/2013  . Blurry vision, bilateral [H53.8] 01/05/2013  . Lumbago [M54.5] 01/05/2013  . Weight loss [R63.4] 07/22/2012  . Face lacerations [S01.81XA] 07/22/2012  . Incarceration [Z65.1]   . Chronic hepatitis B without delta agent without hepatic coma (HCC) [B18.1] 07/11/2010  . CANNABIS ABUSE, EPISODIC [F12.20] 07/11/2010  . SMOKER [F17.200] 07/11/2010  . HIV INFECTION [B20] 06/26/2010   Total Time spent with patient: 20 minutes  Past Psychiatric History: As in H&P  Past Medical History:  Past Medical History:  Diagnosis Date  . Atypical chest pain 08/07/2016  . Depression 08/07/2016  . Emphysema/COPD (HCC)   . Gout   . Hepatitis B    HepC  . HIV (human immunodeficiency virus infection) (HCC)   . Incarceration     Past Surgical History:  Procedure Laterality Date  . AMPUTATION Right 02/04/2017   Procedure: RIGHT SMALL FINGER RAY AMPUTATION;  Surgeon: Mack Hookavid Thompson, MD;  Location: [facility];  Service: Orthopedics;  Laterality: Right;  . FLEXOR TENDON REPAIR Right 02/27/2016   Procedure: RIGHT SMALL FINGER FLEXOR TENDON REPAIR;  Surgeon: Mack Hookavid Thompson, MD;  Location: [facility];  Service: Orthopedics;  Laterality: Right;  . I&D EXTREMITY  05/21/2012   Procedure: IRRIGATION AND DEBRIDEMENT EXTREMITY;  Surgeon: Javier DockerJeffrey C Beane, MD;  Location: [facility];  Service: Orthopedics;  Laterality: Right;   Family History: History reviewed. No pertinent family history. Family Psychiatric  History: As in H&P Social History:  Social  History   Substance and Sexual Activity  Alcohol Use Yes   Comment: Weekends.      Social History   Substance and Sexual Activity  Drug Use Yes  . Frequency: 5.0 times per week  . Types: Marijuana   Comment: Yesterday     Social History   Socioeconomic History  . Marital status:  Single    Spouse name: None  . Number of children: None  . Years of education: None  . Highest education level: None  Social Needs  . Financial resource strain: None  . Food insecurity - worry: None  . Food insecurity - inability: None  . Transportation needs - medical: None  . Transportation needs - non-medical: None  Occupational History  . None  Tobacco Use  . Smoking status: Current Every Day Smoker    Packs/day: 0.50    Types: Cigarettes    Last attempt to quit: 05/02/2016    Years since quitting: 1.4  . Smokeless tobacco: Never Used  Substance and Sexual Activity  . Alcohol use: Yes    Comment: Weekends.   . Drug use: Yes    Frequency: 5.0 times per week    Types: Marijuana    Comment: Yesterday   . Sexual activity: None    Comment: given condoms  Other Topics Concern  . None  Social History Narrative  . None   Additional Social History:      Sleep: Good  Appetite:  Good  Current Medications: Current Facility-Administered Medications  Medication Dose Route Frequency Provider Last Rate Last Dose  . acetaminophen (TYLENOL) tablet 650 mg  650 mg Oral Q4H PRN Okonkwo, Justina A, NP   650 mg at 10/26/17 1314  . alum & mag hydroxide-simeth (MAALOX/MYLANTA) 200-200-20 MG/5ML suspension 30 mL  30 mL Oral Q6H PRN Okonkwo, Justina A, NP      . hydrOXYzine (ATARAX/VISTARIL) tablet 25 mg  25 mg Oral TID PRN Beryle Lathekonkwo, Justina A, NP   25 mg at 10/26/17 2157  . magnesium hydroxide (MILK OF MAGNESIA) suspension 30 mL  30 mL Oral Daily PRN Okonkwo, Justina A, NP      . mometasone-formoterol (DULERA) 100-5 MCG/ACT inhaler 2 puff  2 puff Inhalation BID Okonkwo, Justina A, NP   2 puff at 10/27/17 1028  . nicotine (NICODERM CQ - dosed in mg/24 hours) patch 21 mg  21 mg Transdermal Daily Okonkwo, Justina A, NP   21 mg at 10/27/17 1029  . OLANZapine (ZYPREXA) tablet 10 mg  10 mg Oral QHS Wynn Kernes, Delight OvensVincent A, MD   10 mg at 10/26/17 2158    Lab Results:  Results for orders placed or  performed during the hospital encounter of 10/25/17 (from the past 48 hour(s))  TSH     Status: None   Collection Time: 10/26/17  7:50 PM  Result Value Ref Range   TSH 2.534 0.350 - 4.500 uIU/mL    Comment: Performed by a 3rd Generation assay with a functional sensitivity of <=0.01 uIU/mL. Performed at [facility], [address] [place], [place] [address]     Blood Alcohol level:  Lab Results  Component Value Date   [facility] 10/24/2017   ETH <5 02/07/2015    Metabolic Disorder Labs: Lab Results  Component Value Date   HGBA1C 5.9 (H) 08/30/2013   MPG 123 (H) 08/30/2013   No results found for: PROLACTIN Lab Results  Component Value Date   CHOL 131 02/09/2015   TRIG 67 02/09/2015  HDL 21 (L) 02/09/2015   CHOLHDL 6.2 02/09/2015   VLDL 13 02/09/2015   LDLCALC 97 02/09/2015   LDLCALC 81 06/16/2013    Physical Findings: AIMS: Facial and Oral Movements Muscles of Facial Expression: None, normal Lips and Perioral Area: None, normal Jaw: None, normal Tongue: None, normal,Extremity Movements Upper (arms, wrists, hands, fingers): None, normal Lower (legs, knees, ankles, toes): None, normal, Trunk Movements Neck, shoulders, hips: None, normal, Overall Severity Severity of abnormal movements (highest score from questions above): None, normal Incapacitation due to abnormal movements: None, normal Patient's awareness of abnormal movements (rate only patient's report): Aware, severe distress, Dental Status Current problems with teeth and/or dentures?: No Does patient usually wear dentures?: No  CIWA:    COWS:     Musculoskeletal: Strength & Muscle Tone: within normal limits Gait & Station: normal Patient leans: N/A  Psychiatric Specialty Exam: Physical Exam  Constitutional: He is oriented to person, place, and time. He appears well-developed and well-nourished.  HENT:  Head: Normocephalic and atraumatic.  Respiratory: Effort normal.  Neurological:  He is alert and oriented to person, place, and time.  Psychiatric:  As above.    ROS  Blood pressure 133/87, pulse 86, temperature 98.1 F (36.7 C), temperature source Oral, resp. rate 18, height 5\' 8"  (1.727 m), weight 69.9 kg (154 lb), SpO2 100 %.Body mass index is 23.42 kg/m.  General Appearance: Much calmer today. Not in any distress. Not internally distracted.   Eye Contact:  Good  Speech:  Not pressured or loud  Volume:  Normal  Mood:  Feels better  Affect:  Less irritable  Thought Process:  Linear  Orientation:  Full (Time, Place, and Person)  Thought Content:  Less ruminations today. No thoughts of violence. No hallucination in any modality.   Suicidal Thoughts:  No  Homicidal Thoughts:  No  Memory:  Immediate;   Good Recent;   Fair Remote;   Good  Judgement:  Better  Insight:  Partial   Psychomotor Activity:  Normal  Concentration:  Better  Recall:  Good  Fund of Knowledge:  Good  Language:  Good  Akathisia:  Negative  Handed:    AIMS (if indicated):     Assets:  Desire for Improvement Housing Resilience  ADL's:  Intact  Cognition:  WNL  Sleep:  Number of Hours: 6     Treatment Plan Summary: Patient is gradually coming off psychoactive substances. Somatic discomfort and emotional lability are gradually resolving. He is tolerating Olanzapine well. He seems motivated to engage with ID again. We plan to continue with his current psychotropic medications and monitor him further. Hopeful discharge by midweek.  Psychiatric: SIMD SUD  Medical: HIV HBV  Psychosocial:  Unemployed No income  PLAN: 1. Continue Olanzapine 10 mg at bedtime 2. Encourage unit groups and activities 3. Continue to monitor mood, behavior and interaction with peers 4. Motivational enhancement  5. SW would coordinate aftercare.   Georgiann Cocker, MD 10/27/2017, 12:46 PM

## 2017-10-27 NOTE — [facility] Group Notes (Signed)
[facility] Group Notes:  (Nursing/MHT/Case Management/Adjunct)  Date:  10/27/2017  Time:  [id number]  Nursing Group  For this group the patients watched a TED Talk entitled "The Power of Vulnerability" by Dewain PenningBrene Brown.  Afterwards each patient introduced themselves, why they were here, and something they got from the video.  Patient attended for entirety of group; however, kept falling asleep. Was invited to leave to lay down, stated "They got me on this new medicine"  Advised patient to discuss with his RN.

## 2017-10-27 NOTE — Tx Team (Signed)
Interdisciplinary Treatment and Diagnostic Plan Update  10/27/2017 Time of Session: 1450 Curtis Hodge MRN: [id number]  Principal Diagnosis: Substance induced mood disorder (HCC)  Secondary Diagnoses: Principal Problem:   Substance induced mood disorder (HCC) Active Problems:   MDD (major depressive disorder)   Current Medications:  Current Facility-Administered Medications  Medication Dose Route Frequency Provider Last Rate Last Dose  . acetaminophen (TYLENOL) tablet 650 mg  650 mg Oral Q4H PRN Okonkwo, Justina A, NP   650 mg at 10/26/17 1314  . alum & mag hydroxide-simeth (MAALOX/MYLANTA) 200-200-20 MG/5ML suspension 30 mL  30 mL Oral Q6H PRN Okonkwo, Justina A, NP      . hydrOXYzine (ATARAX/VISTARIL) tablet 25 mg  25 mg Oral TID PRN Beryle Lathekonkwo, Justina A, NP   25 mg at 10/26/17 2157  . magnesium hydroxide (MILK OF MAGNESIA) suspension 30 mL  30 mL Oral Daily PRN Okonkwo, Justina A, NP      . mometasone-formoterol (DULERA) 100-5 MCG/ACT inhaler 2 puff  2 puff Inhalation BID Okonkwo, Justina A, NP   2 puff at 10/27/17 1028  . nicotine (NICODERM CQ - dosed in mg/24 hours) patch 21 mg  21 mg Transdermal Daily Okonkwo, Justina A, NP   21 mg at 10/27/17 1029  . OLANZapine (ZYPREXA) tablet 10 mg  10 mg Oral QHS Izediuno, Delight OvensVincent A, MD   10 mg at 10/26/17 2158   PTA Medications: Medications Prior to Admission  Medication Sig Dispense Refill Last Dose  . acetaminophen (TYLENOL) 325 MG tablet Take 2 tablets (650 mg total) by mouth every 6 (six) hours as needed for mild pain or moderate pain.   unk at [employer]  . darunavir-cobicistat (PREZCOBIX) 800-150 MG tablet Take 1 tablet by mouth daily. 30 tablet 5 LF 2017 per [facility]  . emtricitabine-tenofovir AF (DESCOVY) 200-25 MG tablet Take 1 tablet by mouth daily. 30 tablet 5 LF 2017 per [facility]  . ibuprofen (ADVIL,MOTRIN) 800 MG tablet Take 1 tablet (800 mg total) by mouth 3 (three) times daily. (Patient taking differently: Take 800 mg by mouth  every 8 (eight) hours as needed for mild pain. ) 21 tablet 0 unk at unk  . mometasone-formoterol (DULERA) 100-5 MCG/ACT AERO Inhale 2 puffs into the lungs 2 (two) times daily. 1 Inhaler 5 unk at unk    Patient Stressors: Financial difficulties Health problems Substance abuse  Patient Strengths: Ability for insight Average or above average intelligence Capable of independent living [employer] fund of knowledge Motivation for treatment/growth  Treatment Modalities: Medication Management, Group therapy, Case management,  1 to 1 session with clinician, Psychoeducation, Recreational therapy.   Physician Treatment Plan for Primary Diagnosis: Substance induced mood disorder (HCC) Long Term Goal(s): Improvement in symptoms so as ready for discharge Improvement in symptoms so as ready for discharge   Short Term Goals: Ability to identify changes in lifestyle to reduce recurrence of condition will improve Ability to disclose and discuss suicidal ideas Ability to maintain clinical measurements within normal limits will improve Compliance with prescribed medications will improve Ability to identify and develop effective coping behaviors will improve Ability to maintain clinical measurements within normal limits will improve Compliance with prescribed medications will improve Ability to identify triggers associated with substance abuse/mental health issues will improve  Medication Management: Evaluate patient's response, side effects, and tolerance of medication regimen.  Therapeutic Interventions: 1 to 1 sessions, Unit Group sessions and Medication administration.  Evaluation of Outcomes: Progressing  Physician Treatment Plan for Secondary Diagnosis: Principal Problem:   Substance induced  mood disorder (HCC) Active Problems:   MDD (major depressive disorder)  Long Term Goal(s): Improvement in symptoms so as ready for discharge Improvement in symptoms so as ready for discharge   Short  Term Goals: Ability to identify changes in lifestyle to reduce recurrence of condition will improve Ability to disclose and discuss suicidal ideas Ability to maintain clinical measurements within normal limits will improve Compliance with prescribed medications will improve Ability to identify and develop effective coping behaviors will improve Ability to maintain clinical measurements within normal limits will improve Compliance with prescribed medications will improve Ability to identify triggers associated with substance abuse/mental health issues will improve     Medication Management: Evaluate patient's response, side effects, and tolerance of medication regimen.  Therapeutic Interventions: 1 to 1 sessions, Unit Group sessions and Medication administration.  Evaluation of Outcomes: Progressing   RN Treatment Plan for Primary Diagnosis: Substance induced mood disorder (HCC) Long Term Goal(s): Knowledge of disease and therapeutic regimen to maintain health will improve  Short Term Goals: Ability to identify and develop effective coping behaviors will improve and Compliance with prescribed medications will improve  Medication Management: RN will administer medications as ordered by provider, will assess and evaluate patient's response and provide education to patient for prescribed medication. RN will report any adverse and/or side effects to prescribing provider.  Therapeutic Interventions: 1 on 1 counseling sessions, Psychoeducation, Medication administration, Evaluate responses to treatment, Monitor vital signs and CBGs as ordered, Perform/monitor CIWA, COWS, AIMS and Fall Risk screenings as ordered, Perform wound care treatments as ordered.  Evaluation of Outcomes: Progressing   LCSW Treatment Plan for Primary Diagnosis: Substance induced mood disorder (HCC) Long Term Goal(s): Safe transition to appropriate next level of care at discharge, Engage patient in therapeutic group  addressing interpersonal concerns.  Short Term Goals: Engage patient in aftercare planning with referrals and resources, Increase social support and Increase skills for wellness and recovery  Therapeutic Interventions: Assess for all discharge needs, 1 to 1 time with Social worker, Explore available resources and support systems, Assess for adequacy in community support network, Educate family and significant other(s) on suicide prevention, Complete Psychosocial Assessment, Interpersonal group therapy.  Evaluation of Outcomes: Progressing   Progress in Treatment: Attending groups: Yes. Participating in groups: Yes. Taking medication as prescribed: Yes. Toleration medication: Yes. Family/Significant other contact made: No, will contact:  mother Patient understands diagnosis: Yes. Discussing patient identified problems/goals with staff: Yes. Medical problems stabilized or resolved: Yes. Denies suicidal/homicidal ideation: Yes. Issues/concerns per patient self-inventory: No. Other: none  New problem(s) identified: No, Describe:  none  New Short Term/Long Term Goal(s):  Discharge Plan or Barriers:   Reason for Continuation of Hospitalization: Depression Medication stabilization  Estimated Length of Stay: 11/01/17  Attendees: Patient: 10/27/2017   Physician: Dr Jama Flavorsobos 10/27/2017   Nursing: Joslyn Devonaroline Beaudry, RN 10/27/2017   RN Care Manager: 10/27/2017   Social Worker: Daleen SquibbGreg Charod Slawinski, LCSW 10/27/2017   Recreational Therapist:  10/27/2017   Other:  10/27/2017   Other:  10/27/2017   Other: 10/27/2017        Scribe for Treatment Team: Lorri FrederickWierda, Sukhman Martine Jon, LCSW 10/27/2017 2:51 PM

## 2017-10-27 NOTE — [facility] Suicide Risk Assessment (Addendum)
[facility] INPATIENT:  Family/Significant Other Suicide Prevention Education  Suicide Prevention Education:  Contact Attempts: Curtis Hodge, mother, [phone], has been identified by the patient as the family member/significant other with whom the patient will be residing, and identified as the person(s) who will aid the patient in the event of a mental health crisis.  With written consent from the patient, two attempts were made to provide suicide prevention education, prior to and/or following the patient's discharge.  We were unsuccessful in providing suicide prevention education.  A suicide education pamphlet was given to the patient to share with family/significant other.  Date and time of first attempt:10/27/17, [id number] Date and time of second attempt: 10/29/17 at 1:54PM (voicemail left requesting call back at her earliest convenience)  Trula SladeHeather Smart, MSW, LCSW Clinical Social Worker 10/29/2017 1:54 PM    Lorri FrederickWierda, Gregory Jon, LCSW 10/27/2017, 2:56 PM

## 2017-10-28 DIAGNOSIS — B20 Human immunodeficiency virus [HIV] disease: Secondary | ICD-10-CM

## 2017-10-28 DIAGNOSIS — F1210 Cannabis abuse, uncomplicated: Secondary | ICD-10-CM

## 2017-10-28 DIAGNOSIS — F39 Unspecified mood [affective] disorder: Secondary | ICD-10-CM

## 2017-10-28 DIAGNOSIS — J449 Chronic obstructive pulmonary disease, unspecified: Secondary | ICD-10-CM

## 2017-10-28 MED ORDER — EMTRICITABINE-TENOFOVIR AF 200-25 MG PO TABS
1.0000 | ORAL_TABLET | Freq: Every day | ORAL | Status: DC
  Administered 2017-10-28 – 2017-10-31 (×4): 1 via ORAL
  Filled 2017-10-28 (×4): qty 1
  Filled 2017-10-28 (×2): qty 3
  Filled 2017-10-28 (×2): qty 1

## 2017-10-28 MED ORDER — DARUNAVIR-COBICISTAT 800-150 MG PO TABS
1.0000 | ORAL_TABLET | Freq: Every day | ORAL | Status: DC
  Administered 2017-10-28 – 2017-10-31 (×4): 1 via ORAL
  Filled 2017-10-28 (×2): qty 1
  Filled 2017-10-28 (×2): qty 3
  Filled 2017-10-28 (×4): qty 1

## 2017-10-28 MED ORDER — SERTRALINE HCL 25 MG PO TABS
25.0000 mg | ORAL_TABLET | Freq: Every day | ORAL | Status: DC
  Administered 2017-10-28 – 2017-10-30 (×3): 25 mg via ORAL
  Filled 2017-10-28 (×5): qty 1

## 2017-10-28 NOTE — Progress Notes (Signed)
[facility] MD Progress Note  10/28/2017 12:42 PM Curtis Hodge  MRN:  [id number]   Subjective:  Patient reports that he has been feeling better being here. He still reports depression from dealing with all of the other doctors trying to get help with his HIV and COPD treatments. "Rowe Clack should I keep trying if no one is going to help me." He denies any current SI/HI/AVH and contracts for safety. Patient denies any medication side effects.   Objective: Patient's chart and findings reviewed and discussed with treatment team. Patient a[ppears flat and depressed. Due to his concerns, he is seen with Dr. Jama Flavors. Infectious Disease (ID) is contacted and since labs had been drawn patient will be restarted on Descovy 200-25 mg Daily and Prezcobix 800-150 mg Daily for his HIV, ID also requested HIV RNA Quant lab drawn. Patient also agrees to start Zoloft for his depression.    Principal Problem: Substance induced mood disorder (HCC) Diagnosis:   Patient Active Problem List   Diagnosis Date Noted  . Substance induced mood disorder (HCC) [F19.94] 10/26/2017  . MDD (major depressive disorder) [F32.9] 10/25/2017  . Atypical chest pain [R07.89] 08/07/2016  . Depression [F32.9] 08/07/2016  . HIV disease (HCC) [B20] 02/09/2015  . Bullous emphysema (HCC) [J43.9] 02/09/2015  . Musculoskeletal chest pain [R07.89] 02/09/2015  . Falling asleep and waking up too early in the evening [G47.00] 04/26/2014  . Sore throat [J02.9] 04/26/2014  . Sciatic pain [M54.30] 08/30/2013  . Low testosterone [R79.89] 08/30/2013  . Hemorrhoid [K64.9] 08/30/2013  . Blurry vision, bilateral [H53.8] 01/05/2013  . Lumbago [M54.5] 01/05/2013  . Weight loss [R63.4] 07/22/2012  . Face lacerations [S01.81XA] 07/22/2012  . Incarceration [Z65.1]   . Chronic hepatitis B without delta agent without hepatic coma (HCC) [B18.1] 07/11/2010  . CANNABIS ABUSE, EPISODIC [F12.20] 07/11/2010  . SMOKER [F17.200] 07/11/2010  . HIV INFECTION [B20]  06/26/2010   Total Time spent with patient: 25 minutes  Past Psychiatric History: See H&P  Past Medical History:  Past Medical History:  Diagnosis Date  . Atypical chest pain 08/07/2016  . Depression 08/07/2016  . Emphysema/COPD (HCC)   . Gout   . Hepatitis B    HepC  . HIV (human immunodeficiency virus infection) (HCC)   . Incarceration     Past Surgical History:  Procedure Laterality Date  . AMPUTATION Right 02/04/2017   Procedure: RIGHT SMALL FINGER RAY AMPUTATION;  Surgeon: [name], MD;  Location: [facility];  Service: Orthopedics;  Laterality: Right;  . FLEXOR TENDON REPAIR Right 02/27/2016   Procedure: RIGHT SMALL FINGER FLEXOR TENDON REPAIR;  Surgeon: [name], MD;  Location: [facility];  Service: Orthopedics;  Laterality: Right;  . I&D EXTREMITY  05/21/2012   Procedure: IRRIGATION AND DEBRIDEMENT EXTREMITY;  Surgeon: [name], MD;  Location: [facility];  Service: Orthopedics;  Laterality: Right;   Family History: History reviewed. No pertinent family history. Family Psychiatric  History: See H&P Social History:  Social History   Substance and Sexual Activity  Alcohol Use Yes   Comment: Weekends.      Social History   Substance and Sexual Activity  Drug Use Yes  . Frequency: 5.0 times per week  . Types: Marijuana   Comment: Yesterday     Social History   Socioeconomic History  . Marital status: Single    Spouse name: None  . Number of children: None  . Years of education: None  . Highest education level: None  Social Needs  .  Financial resource strain: None  . Food insecurity - worry: None  . Food insecurity - inability: None  . Transportation needs - medical: None  . Transportation needs - non-medical: None  Occupational History  . None  Tobacco Use  . Smoking status: Current Every Day Smoker    Packs/day: 0.50    Types: Cigarettes    Last attempt to quit: 05/02/2016    Years since quitting: 1.4  .  Smokeless tobacco: Never Used  Substance and Sexual Activity  . Alcohol use: Yes    Comment: Weekends.   . Drug use: Yes    Frequency: 5.0 times per week    Types: Marijuana    Comment: Yesterday   . Sexual activity: None    Comment: given condoms  Other Topics Concern  . None  Social History Narrative  . None   Additional Social History:                         Sleep: Good  Appetite:  Good  Current Medications: Current Facility-Administered Medications  Medication Dose Route Frequency Provider Last Rate Last Dose  . acetaminophen (TYLENOL) tablet 650 mg  650 mg Oral Q4H PRN Okonkwo, Justina A, NP   650 mg at 10/26/17 1314  . alum & mag hydroxide-simeth (MAALOX/MYLANTA) 200-200-20 MG/5ML suspension 30 mL  30 mL Oral Q6H PRN Okonkwo, Justina A, NP      . darunavir-cobicistat (PREZCOBIX) 800-150 MG per tablet 1 tablet  1 tablet Oral Daily Darnesha Diloreto, Rockey SituFernando A, MD      . emtricitabine-tenofovir AF (DESCOVY) 200-25 MG per tablet 1 tablet  1 tablet Oral Daily Brysun Eschmann A, MD      . hydrOXYzine (ATARAX/VISTARIL) tablet 25 mg  25 mg Oral TID PRN Beryle Lathekonkwo, Justina A, NP   25 mg at 10/27/17 1958  . magnesium hydroxide (MILK OF MAGNESIA) suspension 30 mL  30 mL Oral Daily PRN Okonkwo, Justina A, NP      . mometasone-formoterol (DULERA) 100-5 MCG/ACT inhaler 2 puff  2 puff Inhalation BID Okonkwo, Justina A, NP   2 puff at 10/28/17 0756  . nicotine (NICODERM CQ - dosed in mg/24 hours) patch 21 mg  21 mg Transdermal Daily Okonkwo, Justina A, NP   21 mg at 10/28/17 0755  . OLANZapine (ZYPREXA) tablet 10 mg  10 mg Oral QHS Izediuno, Delight OvensVincent A, MD   10 mg at 10/27/17 2216  . sertraline (ZOLOFT) tablet 25 mg  25 mg Oral Daily Hershy Flenner, Rockey SituFernando A, MD        Lab Results:  Results for orders placed or performed during the hospital encounter of 10/25/17 (from the past 48 hour(s))  TSH     Status: None   Collection Time: 10/26/17  7:50 PM  Result Value Ref Range   TSH 2.534 0.350 -  4.500 uIU/mL    Comment: Performed by a 3rd Generation assay with a functional sensitivity of <=0.01 uIU/mL. Performed at [facility], [address] [place], [place] [id number]     Blood Alcohol level:  Lab Results  Component Value Date   [facility] 10/24/2017   ETH <5 02/07/2015    Metabolic Disorder Labs: Lab Results  Component Value Date   HGBA1C 5.9 (H) 08/30/2013   MPG 123 (H) 08/30/2013   No results found for: PROLACTIN Lab Results  Component Value Date   CHOL 131 02/09/2015   TRIG 67 02/09/2015   HDL 21 (L) 02/09/2015  CHOLHDL 6.2 02/09/2015   VLDL 13 02/09/2015   LDLCALC 97 02/09/2015   LDLCALC 81 06/16/2013    Physical Findings: AIMS: Facial and Oral Movements Muscles of Facial Expression: None, normal Lips and Perioral Area: None, normal Jaw: None, normal Tongue: None, normal,Extremity Movements Upper (arms, wrists, hands, fingers): None, normal Lower (legs, knees, ankles, toes): None, normal, Trunk Movements Neck, shoulders, hips: None, normal, Overall Severity Severity of abnormal movements (highest score from questions above): None, normal Incapacitation due to abnormal movements: None, normal Patient's awareness of abnormal movements (rate only patient's report): No Awareness, Dental Status Current problems with teeth and/or dentures?: No Does patient usually wear dentures?: No  CIWA:    COWS:     Musculoskeletal: Strength & Muscle Tone: within normal limits Gait & Station: normal Patient leans: N/A  Psychiatric Specialty Exam: Physical Exam  Nursing note and vitals reviewed. Constitutional: He is oriented to person, place, and time. He appears well-developed and well-nourished.  Cardiovascular: Normal rate.  Respiratory: Effort normal.  Musculoskeletal: Normal range of motion.  Neurological: He is alert and oriented to person, place, and time.  Skin: Skin is warm.    Review of Systems  Constitutional: Negative.   HENT:  Negative.   Eyes: Negative.   Respiratory: Negative.   Cardiovascular: Negative.   Gastrointestinal: Negative.   Musculoskeletal: Negative.   Skin: Negative.   Neurological: Negative.   Endo/Heme/Allergies: Negative.   Psychiatric/Behavioral: Positive for depression. Negative for hallucinations and suicidal ideas.    Blood pressure (!) 150/94, pulse 73, temperature 97.8 F (36.6 C), temperature source Oral, resp. rate 16, height 5\' 8"  (1.727 m), weight 69.9 kg (154 lb), SpO2 100 %.Body mass index is 23.42 kg/m.  General Appearance: Casual  Eye Contact:  Good  Speech:  Clear and Coherent and Normal Rate  Volume:  Normal  Mood:  Euthymic  Affect:  Appropriate  Thought Process:  Goal Directed and Descriptions of Associations: Intact  Orientation:  Full (Time, Place, and Person)  Thought Content:  WDL  Suicidal Thoughts:  No  Homicidal Thoughts:  No  Memory:  Immediate;   Good Recent;   Good Remote;   Good  Judgement:  Good  Insight:  Good  Psychomotor Activity:  Normal  Concentration:  Concentration: Good and Attention Span: Good  Recall:  Good  Fund of Knowledge:  Good  Language:  Good  Akathisia:  No  Handed:  Right  AIMS (if indicated):     Assets:  Communication Skills Desire for Improvement Financial Resources/Insurance Social Support  ADL's:  Intact  Cognition:  WNL  Sleep:  Number of Hours: 6.5   Problems Addressed: MDD  SUD HIV  Treatment Plan Summary: Daily contact with patient to assess and evaluate symptoms and progress in treatment, Medication management and Plan is to:  -Continue Zyprexa 10 mg PO QHS for mood stability -Start Zoloft 25 mg PO Daily for mood stability -Start Prezcobix 800-150 mg PO Daily for HIV -Start Descovy 200-25 mg PO Daily for HIV -Continue Vistaril 25 mg PO TID PRN for anxiety -Encourage group therapy participation  Maryfrances Bunnellravis B Money, FNP 10/28/2017, 12:42 PM   Agree with NP Progress Note

## 2017-10-28 NOTE — Progress Notes (Signed)
Pt seems to be in a good mood tonight, and states that he had a good day today.  He is glad to be back on his meds and writer spent some time talking about the meds he has for tonight and doing education with patient about his meds.  He denies SI/HI/AVH.  He has been sitting in the dayroom most of the evening playing cards and talking with peers.  He has been pleasant and appropriate.  Support and encouragement offered.  Discharge plans are in process.  Pt is not sure what he will do after discharge, and says it will depend on whether his family lets him return home.  Safety maintained with q15 minute checks.

## 2017-10-28 NOTE — Progress Notes (Signed)
D:  Curtis Hodge has been up and visible on the unit.  Interacting well with peers.  He attended evening group.  He denies any pain or discomfort and appears to be in no physical distress.  He stated that he was doing pretty good.  He reported that he was glad that he came in so he could work on getting himself feeling better.  He stated that he was having some anxiety this evening but the medication has been helpful A:  1:1 with RN for support and encouragement.  Medications as ordered.  Encouraged continued participation in group and unit activities.  Q 15 minute checks maintained for safety. R:  He remains safe on the unit.  We will continue to monitor the progress towards his goals.

## 2017-10-28 NOTE — [facility] Group Notes (Signed)
Adult Psychoeducational Group Note  Date:  10/28/2017 Time:  0900-0945  Psychoeducational nursing group Patient invited to group but did not attend 

## 2017-10-28 NOTE — Progress Notes (Signed)
D: Patient visible in the milieu.  He is interacting well with staff and his peers.  He denies any physical pain or thoughts of self harm.  His blood pressure continues to be elevated this morning.  Patient's stressors are unemployment and his disability has been denies three times.  Patient denies auditory hallucinations and does not appear to be responding to internal stimuli.  A: Continue to monitor medication management and MD orders.  Safety checks completed every 15 minutes per protocol.  Offer support and encouragement as needed.  R: Patient is receptive to staff; his behavior is appropriate.

## 2017-10-29 NOTE — Progress Notes (Signed)
Recreation Therapy Notes  Date: 10/29/17 Time: 0930 Location: 300 Hall Dayroom  Group Topic: Stress Management  Goal Area(s) Addresses:  Patient will verbalize importance of using healthy stress management.  Patient will identify positive emotions associated with healthy stress management.   Intervention: Stress Management  Activity :  Guided Imagery.  LRT introduced the stress management technique of guided imagery.  LRT read Hodge script that allowed patients to envision their peaceful place.  Patients were to follow along as script was read.  Education:  Stress Management, Discharge Planning.   Education Outcome: Acknowledges edcuation/In group clarification offered/Needs additional education  Clinical Observations/Feedback: Pt did not attend group.    Curtis Hodge, LRT/CTRS         Curtis Hodge 10/29/2017 12:19 PM 

## 2017-10-29 NOTE — Progress Notes (Signed)
[facility] MD Progress Note 12/13 /18, 2,15 PM Curtis Hodge  MRN:  [id number]   Subjective:  Patient reports he is feeling better, less depressed and as he improves he is more future oriented, focusing on disposition issues such as insuring that he gets his antiviral medications as an outpatient. Denies medication side effects at this time. Denies suicidal ideations .   Objective:  I have discussed case with treatment team and have met with patient. Patient reports improving mood, and presents with an improved range of affect. At this time denies SI and is future oriented, stating he intends to follow up at ID clinic for appropriate HIV management and follow ups. Ruminates about having had difficulty getting HIV medications in the past- states he had significant difficulty in having medications appropriately sent to his home , resulting in non compliance which in turn caused him to feel more anxious and depressed . He denies suicidal ideations at this time. Denies medication side effects. No disruptive or agitated behaviors on unit . Visible on unit, interactive with peers .   Principal Problem: Substance induced mood disorder ([place]) Diagnosis:   Patient Active Problem List   Diagnosis Date Noted  . Substance induced mood disorder ([place]) [F19.94] 10/26/2017  . MDD (major depressive disorder) [F32.9] 10/25/2017  . Atypical chest pain [R07.89] 08/07/2016  . Depression [F32.9] 08/07/2016  . HIV disease ([place]) [B20] 02/09/2015  . Bullous emphysema ([place]) [J43.9] 02/09/2015  . Musculoskeletal chest pain [R07.89] 02/09/2015  . Falling asleep and waking up too early in the evening [G47.00] 04/26/2014  . Sore throat [J02.9] 04/26/2014  . Sciatic pain [M54.30] 08/30/2013  . Low testosterone [R79.89] 08/30/2013  . Hemorrhoid [K64.9] 08/30/2013  . Blurry vision, bilateral [H53.8] 01/05/2013  . Lumbago [M54.5] 01/05/2013  . Weight loss [R63.4] 07/22/2012  . Face lacerations [S01.81XA] 07/22/2012   . Incarceration [Z65.1]   . Chronic hepatitis B without delta agent without hepatic coma ([place]) [B18.1] 07/11/2010  . CANNABIS ABUSE, EPISODIC [F12.20] 07/11/2010  . SMOKER [F17.200] 07/11/2010  . HIV INFECTION [B20] 06/26/2010   Total Time spent with patient: 20 minutes  Past Psychiatric History: See H&P  Past Medical History:  Past Medical History:  Diagnosis Date  . Atypical chest pain 08/07/2016  . Depression 08/07/2016  . Emphysema/COPD ([place])   . Gout   . Hepatitis B    HepC  . HIV (human immunodeficiency virus infection) ([place])   . Incarceration     Past Surgical History:  Procedure Laterality Date  . AMPUTATION Right 02/04/2017   Procedure: RIGHT SMALL FINGER RAY AMPUTATION;  Surgeon: Milly Jakob, MD;  Location: [facility];  Service: Orthopedics;  Laterality: Right;  . FLEXOR TENDON REPAIR Right 02/27/2016   Procedure: RIGHT SMALL FINGER FLEXOR TENDON REPAIR;  Surgeon: Milly Jakob, MD;  Location: [facility];  Service: Orthopedics;  Laterality: Right;  . I&D EXTREMITY  05/21/2012   Procedure: IRRIGATION AND DEBRIDEMENT EXTREMITY;  Surgeon: Johnn Hai, MD;  Location: [facility];  Service: Orthopedics;  Laterality: Right;   Family History: History reviewed. No pertinent family history. Family Psychiatric  History: See H&P Social History:  Social History   Substance and Sexual Activity  Alcohol Use Yes   Comment: Weekends.      Social History   Substance and Sexual Activity  Drug Use Yes  . Frequency: 5.0 times per week  . Types: Marijuana   Comment: Yesterday     Social History   Socioeconomic History  .  Marital status: Single    Spouse name: None  . Number of children: None  . Years of education: None  . Highest education level: None  Social Needs  . Financial resource strain: None  . Food insecurity - worry: None  . Food insecurity - inability: None  . Transportation needs - medical: None  . Transportation needs -  non-medical: None  Occupational History  . None  Tobacco Use  . Smoking status: Current Every Day Smoker    Packs/day: 0.50    Types: Cigarettes    Last attempt to quit: 05/02/2016    Years since quitting: 1.4  . Smokeless tobacco: Never Used  Substance and Sexual Activity  . Alcohol use: Yes    Comment: Weekends.   . Drug use: Yes    Frequency: 5.0 times per week    Types: Marijuana    Comment: Yesterday   . Sexual activity: None    Comment: given condoms  Other Topics Concern  . None  Social History Narrative  . None   Additional Social History:   Sleep: Good  Appetite:  Good  Current Medications: Current Facility-Administered Medications  Medication Dose Route Frequency Provider Last Rate Last Dose  . acetaminophen (TYLENOL) tablet 650 mg  650 mg Oral Q4H PRN Okonkwo, Justina A, NP   650 mg at 10/26/17 1314  . alum & mag hydroxide-simeth (MAALOX/MYLANTA) 200-200-20 MG/5ML suspension 30 mL  30 mL Oral Q6H PRN Okonkwo, Justina A, NP      . darunavir-cobicistat (PREZCOBIX) 800-150 MG per tablet 1 tablet  1 tablet Oral Daily Chinaza Rooke, Myer Peer, MD   1 tablet at 10/29/17 0848  . emtricitabine-tenofovir AF (DESCOVY) 200-25 MG per tablet 1 tablet  1 tablet Oral Daily Conor Lata, Myer Peer, MD   1 tablet at 10/29/17 0848  . hydrOXYzine (ATARAX/VISTARIL) tablet 25 mg  25 mg Oral TID PRN Hughie Closs A, NP   25 mg at 10/28/17 2156  . magnesium hydroxide (MILK OF MAGNESIA) suspension 30 mL  30 mL Oral Daily PRN Okonkwo, Justina A, NP      . mometasone-formoterol (DULERA) 100-5 MCG/ACT inhaler 2 puff  2 puff Inhalation BID Okonkwo, Justina A, NP   2 puff at 10/29/17 0847  . nicotine (NICODERM CQ - dosed in mg/24 hours) patch 21 mg  21 mg Transdermal Daily Okonkwo, Justina A, NP   21 mg at 10/29/17 0847  . OLANZapine (ZYPREXA) tablet 10 mg  10 mg Oral QHS Izediuno, Laruth Bouchard, MD   10 mg at 10/28/17 2156  . sertraline (ZOLOFT) tablet 25 mg  25 mg Oral Daily Dee Maday, Myer Peer, MD   25 mg  at 10/29/17 0848    Lab Results:  No results found for this or any previous visit (from the past 48 hour(s)).  Blood Alcohol level:  Lab Results  Component Value Date   ETH <10 10/24/2017   ETH <5 50/07/3817    Metabolic Disorder Labs: Lab Results  Component Value Date   HGBA1C 5.9 (H) 08/30/2013   MPG 123 (H) 08/30/2013   No results found for: PROLACTIN Lab Results  Component Value Date   CHOL 131 02/09/2015   TRIG 67 02/09/2015   HDL 21 (L) 02/09/2015   CHOLHDL 6.2 02/09/2015   VLDL 13 02/09/2015   LDLCALC 97 02/09/2015   LDLCALC 81 06/16/2013    Physical Findings: AIMS: Facial and Oral Movements Muscles of Facial Expression: None, normal Lips and Perioral Area: None, normal Jaw: None, normal Tongue: None,  normal,Extremity Movements Upper (arms, wrists, hands, fingers): None, normal Lower (legs, knees, ankles, toes): None, normal, Trunk Movements Neck, shoulders, hips: None, normal, Overall Severity Severity of abnormal movements (highest score from questions above): None, normal Incapacitation due to abnormal movements: None, normal Patient's awareness of abnormal movements (rate only patient's report): No Awareness, Dental Status Current problems with teeth and/or dentures?: No Does patient usually wear dentures?: No  CIWA:    COWS:     Musculoskeletal: Strength & Muscle Tone: within normal limits Gait & Station: normal Patient leans: N/A  Psychiatric Specialty Exam: Physical Exam  Nursing note and vitals reviewed. Constitutional: He is oriented to person, place, and time. He appears well-developed and well-nourished.  Cardiovascular: Normal rate.  Respiratory: Effort normal.  Musculoskeletal: Normal range of motion.  Neurological: He is alert and oriented to person, place, and time.  Skin: Skin is warm.    Review of Systems  Constitutional: Negative.   HENT: Negative.   Eyes: Negative.   Respiratory: Negative.   Cardiovascular: Negative.    Gastrointestinal: Negative.   Musculoskeletal: Negative.   Skin: Negative.   Neurological: Negative.   Endo/Heme/Allergies: Negative.   Psychiatric/Behavioral: Positive for depression. Negative for hallucinations and suicidal ideas.  denies headache, denies chest pain, no dyspnea at room air  Blood pressure (!) 154/103, pulse 88, temperature 97.9 F (36.6 C), temperature source Oral, resp. rate 18, height '5\' 8"'  (1.727 m), weight 69.9 kg (154 lb), SpO2 100 %.Body mass index is 23.42 kg/m.  General Appearance: Fairly Groomed  Eye Contact:  Good  Speech:  Normal Rate  Volume:  Normal  Mood:  improving mood, fuller range of affect   Affect:  more reactive affect, smiles at times appropriately   Thought Process:  Linear and Descriptions of Associations: Intact  Orientation:  Other:  fully alert and attentive   Thought Content:  denies hallucinations, no delusions, not internally preoccupied  Suicidal Thoughts:  No denies suicidal or self injurious plan or intention and presents future oriented, denies homicidal ideations, contracts for safety on unit   Homicidal Thoughts:  No  Memory:  recent and remote grossly intact   Judgement:  Other:  improving   Insight:  improving   Psychomotor Activity:  Normal  Concentration:  Concentration: Good and Attention Span: Good  Recall:  Good  Fund of Knowledge:  Good  Language:  Good  Akathisia:  No  Handed:  Right  AIMS (if indicated):     Assets:  Communication Skills Desire for Improvement Financial Resources/Insurance Social Support  ADL's:  Intact  Cognition:  WNL  Sleep:  Number of Hours: 6   Assessment - patient is reporting improved mood and presents with fuller range of affect, denies SI, and is currently future oriented. He focuses on insuring appropriate outpatient medical management and treatment of chronic illness/HIV. Denies medication side effects and is tolerating antiretroviral medications well at this time.   Treatment Plan  Summary: Treatment Plan reviewed as below today 12/13  Daily contact with patient to assess and evaluate symptoms and progress in treatment, Medication management and Plan is to:  -Continue Zyprexa 10 mg PO QHS for mood disorder  -Increase  Zoloft to 50 mg PO Daily for depression -Continue  Prezcobix 800-150 mg PO Daily for HIV -Continue  Descovy 200-25 mg PO Daily for HIV -Continue Vistaril 25 mg PO TID PRN for anxiety -Encourage group therapy participation to work on Radiographer, therapeutic and symptom reduction -Treatment Team working on disposition planning - patient plans  to continue treatment with ID Clinic following discharge.  Jenne Campus, MD 10/29/2017, 3:28 PMPatient ID: Curtis Hodge, male   DOB: 1966/04/21, 51 y.o.   MRN: [id number]

## 2017-10-29 NOTE — Progress Notes (Signed)
Patient ID: [name], male   DOB: [birth year], 51 y.o.   MRN: [id number]  Pt currently presents with an anxious affect and behavior. Pt preoccupied with somatic complaints. Expresses worry about HIV levels, chronic angina, Emphysema and COPD diagnosis and inability to pay for medical conditions. Pt requests follow up from the CSW and MD about these things. Pt reports good sleep with current medication regimen.  Pt provided with medications per providers orders. Pt's labs and vitals were monitored throughout the night. WDL. Pt given a 1:1 about emotional and mental status. Pt supported and encouraged to express concerns and questions. Pt educated on medications and healthy coping skills for anxiety.   Pt's safety ensured with 15 minute and environmental checks. Pt currently denies SI/HI and A/V hallucinations. Pt verbally agrees to seek staff if SI/HI or A/VH occurs and to consult with staff before acting on any harmful thoughts. Will continue POC.

## 2017-10-29 NOTE — Progress Notes (Signed)
D:Pt has been out in the dayroom interacting with his peers. Pt's blood pressure was elevated this morning and reported to MD during treatment team. His blood pressure was rechecked this evening at 133/88 and no signs or symptoms of distress. A:Offered support, encouragement and 15 minute checks.  R:Pt denies si and hi. Safety maintained on the unit.

## 2017-10-29 NOTE — Progress Notes (Signed)
[facility]  10/29/2017 3:56 PM Curtis Hodge  MRN:  [id number]   Subjective:  Patient reports that he is feeling much better today. "It was a big relief just to know that someone was listening to me." He denies any medication side effects and states that he likes the Zoloft. He denies any SI/HI/AVH and contracts for safety. He denies any anxiety and rates his depression at 2/10. He reports that he plans on discharging by Friday and will return home with his sister and aunt. He states that they will provide transportation to his appointments and he just wants to make sure the CSW is helping set-up appointments.  Objective: Patient's chart and findings reviewed and discussed with treatment team. Patient is pleasant and cooperative. He has been in the day room interacting appropriatetly. He has been attending groups and was seen returning from the gym exercising today. Will continue current medications and will discuss with CSW about appointments. Discharge on Friday is very promising.    Principal Problem: Substance induced mood disorder (HCC) Diagnosis:   Patient Active Problem List   Diagnosis Date Noted  . Substance induced mood disorder (HCC) [F19.94] 10/26/2017  . MDD (major depressive disorder) [F32.9] 10/25/2017  . Atypical chest pain [R07.89] 08/07/2016  . Depression [F32.9] 08/07/2016  . HIV disease (HCC) [B20] 02/09/2015  . Bullous emphysema (HCC) [J43.9] 02/09/2015  . Musculoskeletal chest pain [R07.89] 02/09/2015  . Falling asleep and waking up too early in the evening [G47.00] 04/26/2014  . Sore throat [J02.9] 04/26/2014  . Sciatic pain [M54.30] 08/30/2013  . Low testosterone [R79.89] 08/30/2013  . Hemorrhoid [K64.9] 08/30/2013  . Blurry vision, bilateral [H53.8] 01/05/2013  . Lumbago [M54.5] 01/05/2013  . Weight loss [R63.4] 07/22/2012  . Face lacerations [S01.81XA] 07/22/2012  . Incarceration [Z65.1]   . 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 CANNABIS ABUSE, EPISODIC [F12.20] 07/11/2010  . SMOKER [F17.200] 07/11/2010  . HIV INFECTION [B20] 06/26/2010   Total Time spent with patient: 15 minutes  Past Psychiatric History: See H&P  Past Medical History:  Past Medical History:  Diagnosis Date  . Atypical chest pain 08/07/2016  . Depression 08/07/2016  . Emphysema/COPD (HCC)   . Gout   . Hepatitis B    HepC  . HIV (human immunodeficiency virus infection) (HCC)   . Incarceration     Past Surgical History:  Procedure Laterality Date  . AMPUTATION Right 02/04/2017   Procedure: RIGHT SMALL FINGER RAY AMPUTATION;  Surgeon: Mack Hookavid Thompson, MD;  Location: [facility];  Service: Orthopedics;  Laterality: Right;  . FLEXOR TENDON REPAIR Right 02/27/2016   Procedure: RIGHT SMALL FINGER FLEXOR TENDON REPAIR;  Surgeon: Mack Hookavid Thompson, MD;  Location: [facility];  Service: Orthopedics;  Laterality: Right;  . I&D EXTREMITY  05/21/2012   Procedure: IRRIGATION AND DEBRIDEMENT EXTREMITY;  Surgeon: Javier DockerJeffrey C Beane, MD;  Location: [facility];  Service: Orthopedics;  Laterality: Right;   Family History: History reviewed. No pertinent family history. Family Psychiatric  History: See H&P Social History:  Social History   Substance and Sexual Activity  Alcohol Use Yes   Comment: Weekends.      Social History   Substance and Sexual Activity  Drug Use Yes  . Frequency: 5.0 times per week  . Types: Marijuana   Comment: Yesterday     Social History   Socioeconomic History  . Marital status: Single    Spouse name: None  .  Number of children: None  . Years of education: None  . Highest education level: None  Social Needs  . Financial resource strain: None  . Food insecurity - worry: None  . Food insecurity - inability: None  . Transportation needs - medical: None  . Transportation needs - non-medical: None  Occupational History  . None  Tobacco Use  . Smoking status: Current  Every Day Smoker    Packs/day: 0.50    Types: Cigarettes    Last attempt to quit: 05/02/2016    Years since quitting: 1.4  . Smokeless tobacco: Never Used  Substance and Sexual Activity  . Alcohol use: Yes    Comment: Weekends.   . Drug use: Yes    Frequency: 5.0 times per week    Types: Marijuana    Comment: Yesterday   . Sexual activity: None    Comment: given condoms  Other Topics Concern  . None  Social History Narrative  . None   Additional Social History:                         Sleep: Good  Appetite:  Good  Current Medications: Current Facility-Administered Medications  Medication Dose Route Frequency Provider Last Rate Last Dose  . acetaminophen (TYLENOL) tablet 650 mg  650 mg Oral Q4H PRN Okonkwo, Justina A, NP   650 mg at 10/26/17 1314  . alum & mag hydroxide-simeth (MAALOX/MYLANTA) 200-200-20 MG/5ML suspension 30 mL  30 mL Oral Q6H PRN Okonkwo, Justina A, NP      . darunavir-cobicistat (PREZCOBIX) 800-150 MG per tablet 1 tablet  1 tablet Oral Daily Cobos, Rockey Situ, MD   1 tablet at 10/29/17 0848  . emtricitabine-tenofovir AF (DESCOVY) 200-25 MG per tablet 1 tablet  1 tablet Oral Daily Cobos, Rockey Situ, MD   1 tablet at 10/29/17 0848  . hydrOXYzine (ATARAX/VISTARIL) tablet 25 mg  25 mg Oral TID PRN Ferne Reus A, NP   25 mg at 10/28/17 2156  . magnesium hydroxide (MILK OF MAGNESIA) suspension 30 mL  30 mL Oral Daily PRN Okonkwo, Justina A, NP      . mometasone-formoterol (DULERA) 100-5 MCG/ACT inhaler 2 puff  2 puff Inhalation BID Okonkwo, Justina A, NP   2 puff at 10/29/17 0847  . nicotine (NICODERM CQ - dosed in mg/24 hours) patch 21 mg  21 mg Transdermal Daily Okonkwo, Justina A, NP   21 mg at 10/29/17 0847  . OLANZapine (ZYPREXA) tablet 10 mg  10 mg Oral QHS Izediuno, Delight Ovens, MD   10 mg at 10/28/17 2156  . sertraline (ZOLOFT) tablet 25 mg  25 mg Oral Daily Cobos, Rockey Situ, MD   25 mg at 10/29/17 0848    Lab Results:  No results found for  this or any previous visit (from the past 48 hour(s)).  Blood Alcohol level:  Lab Results  Component Value Date   ETH <10 10/24/2017   ETH <5 02/07/2015    Metabolic Disorder Labs: Lab Results  Component Value Date   HGBA1C 5.9 (H) 08/30/2013   MPG 123 (H) 08/30/2013   No results found for: PROLACTIN Lab Results  Component Value Date   CHOL 131 02/09/2015   TRIG 67 02/09/2015   HDL 21 (L) 02/09/2015   CHOLHDL 6.2 02/09/2015   VLDL 13 02/09/2015   LDLCALC 97 02/09/2015   LDLCALC 81 06/16/2013    Physical Findings: AIMS: Facial and Oral Movements Muscles of Facial Expression: None, normal  Lips and Perioral Area: None, normal Jaw: None, normal Tongue: None, normal,Extremity Movements Upper (arms, wrists, hands, fingers): None, normal Lower (legs, knees, ankles, toes): None, normal, Trunk Movements Neck, shoulders, hips: None, normal, Overall Severity Severity of abnormal movements (highest score from questions above): None, normal Incapacitation due to abnormal movements: None, normal Patient's awareness of abnormal movements (rate only patient's report): No Awareness, Dental Status Current problems with teeth and/or dentures?: No Does patient usually wear dentures?: No  CIWA:    COWS:     Musculoskeletal: Strength & Muscle Tone: within normal limits Gait & Station: normal Patient leans: N/A  Psychiatric Specialty Exam: Physical Exam  Nursing note and vitals reviewed. Constitutional: He is oriented to person, place, and time. He appears well-developed and well-nourished.  Cardiovascular: Normal rate.  Respiratory: Effort normal.  Musculoskeletal: Normal range of motion.  Neurological: He is alert and oriented to person, place, and time.  Skin: Skin is warm.    Review of Systems  Constitutional: Negative.   HENT: Negative.   Eyes: Negative.   Respiratory: Negative.   Cardiovascular: Negative.   Gastrointestinal: Negative.   Musculoskeletal: Negative.    Skin: Negative.   Neurological: Negative.   Endo/Heme/Allergies: Negative.   Psychiatric/Behavioral: Positive for depression (rates at 2/10). Negative for hallucinations and suicidal ideas.    Blood pressure (!) 154/103, pulse 88, temperature 97.9 F (36.6 C), temperature source Oral, resp. rate 18, height 5\' 8"  (1.727 m), weight 69.9 kg (154 lb), SpO2 100 %.Body mass index is 23.42 kg/m.  General Appearance: Casual  Eye Contact:  Good  Speech:  Clear and Coherent and Normal Rate  Volume:  Normal  Mood:  Euthymic  Affect:  Appropriate  Thought Process:  Goal Directed and Descriptions of Associations: Intact  Orientation:  Full (Time, Place, and Person)  Thought Content:  WDL  Suicidal Thoughts:  No  Homicidal Thoughts:  No  Memory:  Immediate;   Good Recent;   Good Remote;   Good  Judgement:  Good  Insight:  Good  Psychomotor Activity:  Normal  Concentration:  Concentration: Good and Attention Span: Good  Recall:  Good  Fund of Knowledge:  Good  Language:  Good  Akathisia:  No  Handed:  Right  AIMS (if indicated):     Assets:  Communication Skills Desire for Improvement Financial Resources/Insurance Social Support  ADL's:  Intact  Cognition:  WNL  Sleep:  Number of Hours: 6   Problems Addressed: MDD  SUD HIV  Treatment Plan Summary: Daily contact with patient to assess and evaluate symptoms and progress in treatment, Medication management and Plan is to:  -Continue Zyprexa 10 mg PO QHS for mood stability -Continue Zoloft 25 mg PO Daily for mood stability -Continue Prezcobix 800-150 mg PO Daily for HIV -Continue Descovy 200-25 mg PO Daily for HIV -Continue Vistaril 25 mg PO TID PRN for anxiety -Encourage group therapy participation -CSW to assist with appointment arrangments  Maryfrances Bunnellravis B Money, FNP 10/29/2017, 3:56 PM   Agree with NP Progress Note

## 2017-10-30 DIAGNOSIS — F329 Major depressive disorder, single episode, unspecified: Principal | ICD-10-CM

## 2017-10-30 LAB — HIV-1 RNA QUANT-NO REFLEX-BLD
HIV 1 RNA QUANT: 76400 {copies}/mL
LOG10 HIV-1 RNA: 4.883 {Log_copies}/mL

## 2017-10-30 MED ORDER — SERTRALINE HCL 50 MG PO TABS
50.0000 mg | ORAL_TABLET | Freq: Every day | ORAL | Status: DC
  Administered 2017-10-31: 50 mg via ORAL
  Filled 2017-10-30: qty 1
  Filled 2017-10-30 (×3): qty 7
  Filled 2017-10-30: qty 1

## 2017-10-30 MED ORDER — INFLUENZA VAC SPLIT HIGH-DOSE 0.5 ML IM SUSY: 0.5000 mL | PREFILLED_SYRINGE | INTRAMUSCULAR | Status: DC

## 2017-10-30 MED ORDER — PNEUMOCOCCAL VAC POLYVALENT 25 MCG/0.5ML IJ INJ: 0.5000 mL | INJECTION | INTRAMUSCULAR | Status: DC

## 2017-10-30 NOTE — Progress Notes (Signed)
Adult Psychoeducational Group Note  Date:  10/30/2017 Time:  4:35 PM  Group Topic/Focus:  Goals Group:   The focus of this group is to help patients establish daily goals to achieve during treatment and discuss how the patient can incorporate goal setting into their daily lives to aide in recovery.  Participation Level:  Active  Participation Quality:  Drowsy  Affect:  Flat  Cognitive:  Appropriate  Insight: Improving  Engagement in Group:  Improving  Modes of Intervention:  Education  Additional Comments:  Pt stated that he wants to get meds started.   Donell BeersRodney S Roderica Cathell 10/30/2017, 4:35 PM

## 2017-10-30 NOTE — Progress Notes (Signed)
Data. Patient denies SI/HI/AVH. Verbally contracts for safety on the unit and to come to staff before acting of any self harm thoughts/feelings.  Patient has been isolating in his room most of the shift. He reports, "I just don't want to be around people. He has been coloring in his room and will eat in the common room, when peers are at meals. 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 On his self assessment he reports, 1/10 for anxiety, depression and hopelessness. His goal for today is, "Discharge."  Action. Emotional support and encouragement offered. Education provided on medication, indications and side effect. Q 15 minute checks done for safety. Response. Safety on the unit maintained through 15 minute checks.  Medications taken as prescribed.Remained calm through out shift.

## 2017-10-30 NOTE — Progress Notes (Signed)
Patient ID: Curtis Hodge, male   DOB: June 25, 1966, 51 y.o.   MRN: [id number]  Pt currently presents with an anxious affect and superficial behavior. Pt supported emotionally and encouraged to express concerns and questions. Given Dulera inhaler as scheduled. Pt's safety ensured with 15 minute and environmental checks. Pt requests to take medications later in the night. Will reassess at that time. Will continue POC.    Update 10/30/17 10:55 PM Report given to accepting nurse Selena BattenKim, RN. Pt notified of change. Denies any immediate concerns.

## 2017-10-30 NOTE — Progress Notes (Signed)
Pt attend wrap up group. His day was a 9. His goal was to get his blood pressure down. He was not able to accomplish his goal. Pt said his blood pressure is not usual high. He has talk to his peers and this has been the highlight of his day.

## 2017-10-31 MED ORDER — DARUNAVIR-COBICISTAT 800-150 MG PO TABS: 1.0000 | ORAL_TABLET | Freq: Every day | ORAL | 0 refills | Status: DC

## 2017-10-31 MED ORDER — HYDROXYZINE HCL 25 MG PO TABS: 25.0000 mg | ORAL_TABLET | Freq: Three times a day (TID) | ORAL | 0 refills | Status: DC | PRN

## 2017-10-31 MED ORDER — EMTRICITABINE-TENOFOVIR AF 200-25 MG PO TABS: 1.0000 | ORAL_TABLET | Freq: Every day | ORAL | 0 refills | Status: DC

## 2017-10-31 MED ORDER — SERTRALINE HCL 50 MG PO TABS: 50.0000 mg | ORAL_TABLET | Freq: Every day | ORAL | 0 refills | Status: DC

## 2017-10-31 MED ORDER — MOMETASONE FURO-FORMOTEROL FUM 100-5 MCG/ACT IN AERO: 2.0000 | INHALATION_SPRAY | Freq: Two times a day (BID) | RESPIRATORY_TRACT | 0 refills | Status: DC

## 2017-10-31 MED ORDER — OLANZAPINE 10 MG PO TABS: 10.0000 mg | ORAL_TABLET | Freq: Every day | ORAL | 0 refills | Status: DC

## 2017-10-31 NOTE — Discharge Summary (Signed)
Physician Discharge Summary Note  Patient:  Curtis Hodge is an 51 y.o., male MRN:  [id number] DOB:  Apr 30, 1966 Patient phone:  [phone] (home)  Patient address:   [address] [place] [address],  Total Time spent with patient: 20 minutes  Date of Admission:  10/25/2017 Date of Discharge: 10/31/17   Reason for Admission:  Worsening depression with SI  Principal Problem: Substance induced mood disorder [facility]) Discharge Diagnoses: Patient Active Problem List   Diagnosis Date Noted  . Substance induced mood disorder (HCC) [F19.94] 10/26/2017  . MDD (major depressive disorder) [F32.9] 10/25/2017  . Atypical chest pain [R07.89] 08/07/2016  . Depression [F32.9] 08/07/2016  . HIV disease (HCC) [B20] 02/09/2015  . Bullous emphysema (HCC) [J43.9] 02/09/2015  . Musculoskeletal chest pain [R07.89] 02/09/2015  . Falling asleep and waking up too early in the evening [G47.00] 04/26/2014  . Sore throat [J02.9] 04/26/2014  . Sciatic pain [M54.30] 08/30/2013  . Low testosterone [R79.89] 08/30/2013  . Hemorrhoid [K64.9] 08/30/2013  . Blurry vision, bilateral [H53.8] 01/05/2013  . Lumbago [M54.5] 01/05/2013  . Weight loss [R63.4] 07/22/2012  . Face lacerations [S01.81XA] 07/22/2012  . Incarceration [Z65.1]   . Chronic hepatitis B without delta agent without hepatic coma (HCC) [B18.1] 07/11/2010  . CANNABIS ABUSE, EPISODIC [F12.20] 07/11/2010  . SMOKER [F17.200] 07/11/2010  . HIV INFECTION [B20] 06/26/2010    Past Psychiatric History: MDD, SI, Multiple medications  Past Medical History:  Past Medical History:  Diagnosis Date  . Atypical chest pain 08/07/2016  . Depression 08/07/2016  . Emphysema/COPD (HCC)   . Gout   . Hepatitis B    HepC  . HIV (human immunodeficiency virus infection) (HCC)   . Incarceration     Past Surgical History:  Procedure Laterality Date  . AMPUTATION Right 02/04/2017   Procedure: RIGHT SMALL FINGER RAY AMPUTATION;  Surgeon: [name],  MD;  Location: [facility];  Service: Orthopedics;  Laterality: Right;  . FLEXOR TENDON REPAIR Right 02/27/2016   Procedure: RIGHT SMALL FINGER FLEXOR TENDON REPAIR;  Surgeon: [name], MD;  Location: [facility];  Service: Orthopedics;  Laterality: Right;  . I&D EXTREMITY  05/21/2012   Procedure: IRRIGATION AND DEBRIDEMENT EXTREMITY;  Surgeon: [name], MD;  Location: [facility];  Service: Orthopedics;  Laterality: Right;   Family History: History reviewed. No pertinent family history. Family Psychiatric  History: Denies Social History:  Social History   Substance and Sexual Activity  Alcohol Use Yes   Comment: Weekends.      Social History   Substance and Sexual Activity  Drug Use Yes  . Frequency: 5.0 times per week  . Types: Marijuana   Comment: Yesterday     Social History   Socioeconomic History  . Marital status: Single    Spouse name: None  . Number of children: None  . Years of education: None  . Highest education level: None  Social Needs  . Financial resource strain: None  . Food insecurity - worry: None  . Food insecurity - inability: None  . Transportation needs - medical: None  . Transportation needs - non-medical: None  Occupational History  . None  Tobacco Use  . Smoking status: Current Every Day Smoker    Packs/day: 0.50    Types: Cigarettes    Last attempt to quit: 05/02/2016    Years since quitting: 1.4  . Smokeless tobacco: Never Used  Substance and Sexual Activity  . Alcohol use: Yes    Comment:  Weekends.   . Drug use: Yes    Frequency: 5.0 times per week    Types: Marijuana    Comment: Yesterday   . Sexual activity: None    Comment: given condoms  Other Topics Concern  . None  Social History Narrative  . None    Hospital Course:   10/26/17 [facility] MD Assessment: Per assessment note- Curtis Hodge an 51 y.o.male.Curtis Hodge is a 51 y.o. malepresenting with SI with plan, and auditory  hallucinations for a few months. alongwith a PMHx of HIV, depression, atypical chest pain, HepB, gout, and othermedicalconditions listed below. Patientadmits that a few days ago he sat on top of the roof of the parking deck and contemplated jumping however he did not do so. Patient admitted to laying on train tracks and attempting to overdose on half a bottle of 800mg , Ibuprofen, "stating they wouldn't even stay on my stomach, they came back up".Patient states he has wanted help but has never received help, "everybody say I qualify for help, but nobody has called me, no one will talk to me". Per chart review, pt presented to the [facility] Disease Clinic without an appointment, and reported to Curtis [facility] (behavioral health specialist there) that: "Patient stated, "I'm tired of being here". Patient also stated, "Waking up has problems" and "I don't want to wake up" and reported not wanting to wake up anymore. In front of the Curtis Hodge the patient reported that he was seeking medication from Dr. Daiva Hodge that could assist with him not waking up anymore. Patient reported feeling this way for the past three weeks. Patient reported while meeting with the [facility] that the voices were telling him not trust the [facility] and Curtis Hodge, evidencing auditory hallucinations.Patient stated, "the voice in my head helps me do the right thing and makes sure no one takes advantage of me". Patient UDS +cocaine and +THC.  Patient wants treatment and is willing to sign himself in for inpatient treatment. Patient shared he was in incarcerated 30 years ago. Patient spoke with helplessness, stating, "I am tired", I am tired". I can't live like this, everytime I turn around, nobody is helping me". Patient shared no one will give him a job, people are laughing at him because he is an adult without a job. Patient stated, "My family is pressuring me to do better, I take everyones advice and I am not getting no where. I am so  tired, feel like I can't breathe, I am at peace when I am sleep, I don't want to wake up anymore". Patientreportedthat he has been feeling depressed for quite some time, has suicidal ideations withplans.He admits to using marijuana yesterday, cocaine about 1 month ago, and drinks alcohol infrequently approximately 1 time per week with the last use being 3 days ago when he had "a couple beers and a couple shots". He admits to being a cigarette smoker. He denies visual hallucinations. He is not currently under the care of any psychiatry team, and is not prescribed any psychiatric medications. He is requesting help and is here voluntarily. He has no acute medical complaints at this time, was seen 2 days ago for left-sided chest wall pain which he states has not changed and is reproducible. He denies any other complaints at this time. On Evaluation: Brandyn Lowrey is awake, alert and oriented. Patient reports due to multiple stressors " I just don't feel like living anymore." Reports suicidal or homicidal ideation. Denies attempts. Reports auditory hallucination.  Denies visual hallucination and does not appear to be responding to internal stimuli. Patient validates the information in above assessment. Support, encouragement and reassurance was provided.   Patient remained on the [facility] unit for 5 days and stabilized with medication and therapy. Patient was started Zyprexa and titrated to 10 mg Daily, Zoloft 50 mg Daily, and provided Vistaril PRN. Patient had lab work ordered and reviewed for active HIV and restarted on Prezcobix [phone] mg Daily and Descovy 200-25 mg Daily. Patient has shown significant improvement with improved sleep, mood, affect, interaction,and appetite. Patient has been attending groups and participating. Patient has been seen in the day room interacting with staff and peers appropriately. Patient denies any SI/HI/AVHa nd contracts for safety. Patient reports that he needs a bus  pass and will return home with his mom and sister. Patient is provided with samples and prescriptions for his medications upon discharge. Patient agrees to follow up at [facility].    Physical Findings: AIMS: Facial and Oral Movements Muscles of Facial Expression: None, normal Lips and Perioral Area: None, normal Jaw: None, normal Tongue: None, normal,Extremity Movements Upper (arms, wrists, hands, fingers): None, normal Lower (legs, knees, ankles, toes): None, normal, Trunk Movements Neck, shoulders, hips: None, normal, Overall Severity Severity of abnormal movements (highest score from questions above): None, normal Incapacitation due to abnormal movements: None, normal Patient's awareness of abnormal movements (rate only patient's report): No Awareness, Dental Status Current problems with teeth and/or dentures?: No Does patient usually wear dentures?: No  CIWA:    COWS:     Musculoskeletal: Strength & Muscle Tone: within normal limits Gait & Station: normal Patient leans: N/A  Psychiatric Specialty Exam: Physical Exam  Nursing note and vitals reviewed. Constitutional: He is oriented to person, place, and time. He appears well-developed and well-nourished.  Cardiovascular: Normal rate.  Respiratory: Effort normal.  Musculoskeletal: Normal range of motion.  Neurological: He is alert and oriented to person, place, and time.  Skin: Skin is warm.    Review of Systems  Constitutional: Negative.   HENT: Negative.   Eyes: Negative.   Respiratory: Negative.   Cardiovascular: Negative.   Gastrointestinal: Negative.   Genitourinary: Negative.   Musculoskeletal: Negative.   Skin: Negative.   Neurological: Negative.   Endo/Heme/Allergies: Negative.   Psychiatric/Behavioral: Negative.     Blood pressure 135/87, pulse 76, temperature 97.9 F (36.6 C), temperature source Oral, resp. rate 18, height 5\' 8"  (1.727 m), weight 69.9 kg (154 lb), SpO2 99 %.Body mass index is 23.42 kg/m.   General Appearance: Casual  Eye Contact:  Good  Speech:  Clear and Coherent and Normal Rate  Volume:  Normal  Mood:  Euthymic  Affect:  Appropriate  Thought Process:  Goal Directed and Descriptions of Associations: Intact  Orientation:  Full (Time, Place, and Person)  Thought Content:  WDL  Suicidal Thoughts:  No  Homicidal Thoughts:  No  Memory:  Immediate;   Good Recent;   Good Remote;   Good  Judgement:  Good  Insight:  Good  Psychomotor Activity:  Normal  Concentration:  Concentration: Good and Attention Span: Good  Recall:  Good  Fund of Knowledge:  Good  Language:  Good  Akathisia:  No  Handed:  Right  AIMS (if indicated):     Assets:  Communication Skills Desire for Improvement Financial Resources/Insurance Housing Social Support  ADL's:  Intact  Cognition:  WNL  Sleep:  Number of Hours: 6.75     Have you used any form of tobacco  in the last 30 days? (Cigarettes, Smokeless Tobacco, Cigars, and/or Pipes): Yes  Has this patient used any form of tobacco in the last 30 days? (Cigarettes, Smokeless Tobacco, Cigars, and/or Pipes) Yes, Yes, A prescription for an FDA-approved tobacco cessation medication was offered at discharge and the patient refused  Blood Alcohol level:  Lab Results  Component Value Date   [facility]   ETH <5 02/07/2015    Metabolic Disorder Labs:  Lab Results  Component Value Date   HGBA1C 5.9 (H) 08/30/2013   MPG 123 (H) 08/30/2013   No results found for: PROLACTIN Lab Results  Component Value Date   CHOL 131 02/09/2015   TRIG 67 02/09/2015   HDL 21 (L) 02/09/2015   CHOLHDL 6.2 02/09/2015   VLDL 13 02/09/2015   LDLCALC 97 02/09/2015   LDLCALC 81 06/16/2013    See Psychiatric Specialty Exam and Suicide Risk Assessment completed by Attending Physician prior to discharge.  Discharge destination:  Home  Is patient on multiple antipsychotic therapies at discharge:  No   Has Patient had three or more failed trials of  antipsychotic monotherapy by history:  No  Recommended Plan for Multiple Antipsychotic Therapies: NA   Allergies as of 10/31/2017   No Known Allergies     Medication List    STOP taking these medications   acetaminophen 325 MG tablet Commonly known as:  TYLENOL   ibuprofen 800 MG tablet Commonly known as:  ADVIL,MOTRIN     TAKE these medications     Indication  darunavir-cobicistat 800-150 MG tablet Commonly known as:  PREZCOBIX Take 1 tablet by mouth daily. For HIV, Swallow whole. Do NOT crush, break or chew tablets. Take with food. What changed:  additional instructions  Indication:  HIV Disease   emtricitabine-tenofovir AF 200-25 MG tablet Commonly known as:  DESCOVY Take 1 tablet by mouth daily. For HIV What changed:  additional instructions  Indication:  HIV Disease   hydrOXYzine 25 MG tablet Commonly known as:  ATARAX/VISTARIL Take 1 tablet (25 mg total) by mouth 3 (three) times daily as needed for anxiety.  Indication:  Feeling Anxious   mometasone-formoterol 100-5 MCG/ACT Aero Commonly known as:  DULERA Inhale 2 puffs into the lungs 2 (two) times daily. Forbreathing What changed:  additional instructions  Indication:  Asthma   OLANZapine 10 MG tablet Commonly known as:  ZYPREXA Take 1 tablet (10 mg total) by mouth at bedtime. For mood control  Indication:  mood stability   sertraline 50 MG tablet Commonly known as:  ZOLOFT Take 1 tablet (50 mg total) by mouth daily. For mood control  Indication:  mood stability      Follow-up Information    [facility].   [facility] Contact information: [address] [place] [id number] [phone]           Follow-up recommendations:  Continue activity as tolerated. Continue diet as recommended by your PCP. Ensure to keep all appointments with outpatient providers.  Comments:  Patient is instructed prior to discharge to: Take all medications as prescribed by his/her mental  healthcare provider. Report any adverse effects and or reactions from the medicines to his/her outpatient provider promptly. Patient has been instructed & cautioned: To not engage in alcohol and or illegal drug use while on prescription medicines. In the event of worsening symptoms, patient is instructed to call the crisis hotline, 911 and or go to the nearest ED for appropriate evaluation and treatment of symptoms. To  follow-up with his/her primary care provider for your other medical issues, concerns and or health care needs.    Signed: Gerlene Burdockravis B Money, FNP 10/31/2017, 8:00 AM

## 2017-10-31 NOTE — Progress Notes (Signed)
Data. Patient denies SI/HI/AVH. Verbally contracts for safety on the unit and to come to staff before acting of any self harm thoughts/feelings.  Patient interacting well with staff and other patients. Affect is much brighter this shift and he has been observed laughing and joking with peers. Action. Emotional support and encouragement offered. Education provided on medication, indications and side effect. Q 15 minute checks done for safety. Response. Safety on the unit maintained through 15 minute checks.  Medications taken as prescribed. Attended groups. Remained calm and appropriate through out shift.  Pt. discharged to lobby.  Belongings sheet reviewed and signed by pt. and all belongings sent home, including sample medications, medication scripts and bus passes. Paperwork reviewed and pt. able to verbalize understanding of education. Pt. in no current distress and ambulatory.

## 2017-10-31 NOTE — Tx Team (Signed)
Interdisciplinary Treatment and Diagnostic Plan Update  10/31/2017 Time of Session: [id number] Curtis Hodge MRN: [id number]  Principal Diagnosis: Substance induced mood disorder (HCC)  Secondary Diagnoses: Principal Problem:   Substance induced mood disorder (HCC) Active Problems:   HIV INFECTION   MDD (major depressive disorder)   Current Medications:  Current Facility-Administered Medications  Medication Dose Route Frequency Provider Last Rate Last Dose  . acetaminophen (TYLENOL) tablet 650 mg  650 mg Oral Q4H PRN Okonkwo, Justina A, NP   650 mg at 10/26/17 1314  . alum & mag hydroxide-simeth (MAALOX/MYLANTA) 200-200-20 MG/5ML suspension 30 mL  30 mL Oral Q6H PRN Okonkwo, Justina A, NP      . darunavir-cobicistat (PREZCOBIX) 800-150 MG per tablet 1 tablet  1 tablet Oral Daily Cobos, Rockey Situ, MD   1 tablet at 10/30/17 1211  . emtricitabine-tenofovir AF (DESCOVY) 200-25 MG per tablet 1 tablet  1 tablet Oral Daily Cobos, Rockey Situ, MD   1 tablet at 10/30/17 1211  . hydrOXYzine (ATARAX/VISTARIL) tablet 25 mg  25 mg Oral TID PRN Ferne Reus A, NP   25 mg at 10/30/17 2245  . magnesium hydroxide (MILK OF MAGNESIA) suspension 30 mL  30 mL Oral Daily PRN Okonkwo, Justina A, NP      . mometasone-formoterol (DULERA) 100-5 MCG/ACT inhaler 2 puff  2 puff Inhalation BID Okonkwo, Justina A, NP   2 puff at 10/30/17 2005  . nicotine (NICODERM CQ - dosed in mg/24 hours) patch 21 mg  21 mg Transdermal Daily Okonkwo, Justina A, NP   21 mg at 10/30/17 1212  . OLANZapine (ZYPREXA) tablet 10 mg  10 mg Oral QHS Izediuno, Delight Ovens, MD   10 mg at 10/30/17 2245  . pneumococcal 23 valent vaccine (PNU-IMMUNE) injection 0.5 mL  0.5 mL Intramuscular Tomorrow-1000 Nira Conn A, NP      . sertraline (ZOLOFT) tablet 50 mg  50 mg Oral Daily Cobos, Rockey Situ, MD       PTA Medications: Medications Prior to Admission  Medication Sig Dispense Refill Last Dose  . acetaminophen (TYLENOL) 325 MG tablet Take  2 tablets (650 mg total) by mouth every 6 (six) hours as needed for mild pain or moderate pain.   unk at [employer]  . darunavir-cobicistat (PREZCOBIX) 800-150 MG tablet Take 1 tablet by mouth daily. 30 tablet 5 LF 2017 per [facility]  . emtricitabine-tenofovir AF (DESCOVY) 200-25 MG tablet Take 1 tablet by mouth daily. 30 tablet 5 LF 2017 per [facility]  . ibuprofen (ADVIL,MOTRIN) 800 MG tablet Take 1 tablet (800 mg total) by mouth 3 (three) times daily. (Patient taking differently: Take 800 mg by mouth every 8 (eight) hours as needed for mild pain. ) 21 tablet 0 unk at unk  . mometasone-formoterol (DULERA) 100-5 MCG/ACT AERO Inhale 2 puffs into the lungs 2 (two) times daily. 1 Inhaler 5 unk at unk    Patient Stressors: Financial difficulties Health problems Substance abuse  Patient Strengths: Ability for insight Average or above average intelligence Capable of independent living [employer] of knowledge Motivation for treatment/growth  Treatment Modalities: Medication Management, Group therapy, Case management,  1 to 1 session with clinician, Psychoeducation, Recreational therapy.   Physician Treatment Plan for Primary Diagnosis: Substance induced mood disorder (HCC) Long Term Goal(s): Improvement in symptoms so as ready for discharge Improvement in symptoms so as ready for discharge   Short Term Goals: Ability to identify changes in lifestyle to reduce recurrence of condition will improve Ability to disclose and discuss  suicidal ideas Ability to maintain clinical measurements within normal limits will improve Compliance with prescribed medications will improve Ability to identify and develop effective coping behaviors will improve Ability to maintain clinical measurements within normal limits will improve Compliance with prescribed medications will improve Ability to identify triggers associated with substance abuse/mental health issues will improve  Medication Management: Evaluate  patient's response, side effects, and tolerance of medication regimen.  Therapeutic Interventions: 1 to 1 sessions, Unit Group sessions and Medication administration.  Evaluation of Outcomes: Adequate for discharge   Physician Treatment Plan for Secondary Diagnosis: Principal Problem:   Substance induced mood disorder (HCC) Active Problems:   HIV INFECTION   MDD (major depressive disorder)  Long Term Goal(s): Improvement in symptoms so as ready for discharge Improvement in symptoms so as ready for discharge   Short Term Goals: Ability to identify changes in lifestyle to reduce recurrence of condition will improve Ability to disclose and discuss suicidal ideas Ability to maintain clinical measurements within normal limits will improve Compliance with prescribed medications will improve Ability to identify and develop effective coping behaviors will improve Ability to maintain clinical measurements within normal limits will improve Compliance with prescribed medications will improve Ability to identify triggers associated with substance abuse/mental health issues will improve     Medication Management: Evaluate patient's response, side effects, and tolerance of medication regimen.  Therapeutic Interventions: 1 to 1 sessions, Unit Group sessions and Medication administration.  Evaluation of Outcomes: Adequate for discharge   RN Treatment Plan for Primary Diagnosis: Substance induced mood disorder (HCC) Long Term Goal(s): Knowledge of disease and therapeutic regimen to maintain health will improve  Short Term Goals: Ability to identify and develop effective coping behaviors will improve and Compliance with prescribed medications will improve  Medication Management: RN will administer medications as ordered by provider, will assess and evaluate patient's response and provide education to patient for prescribed medication. RN will report any adverse and/or side effects to prescribing  provider.  Therapeutic Interventions: 1 on 1 counseling sessions, Psychoeducation, Medication administration, Evaluate responses to treatment, Monitor vital signs and CBGs as ordered, Perform/monitor CIWA, COWS, AIMS and Fall Risk screenings as ordered, Perform wound care treatments as ordered.  Evaluation of Outcomes: Adequate for discharge    LCSW Treatment Plan for Primary Diagnosis: Substance induced mood disorder (HCC) Long Term Goal(s): Safe transition to appropriate next level of care at discharge, Engage patient in therapeutic group addressing interpersonal concerns.  Short Term Goals: Engage patient in aftercare planning with referrals and resources, Increase social support and Increase skills for wellness and recovery  Therapeutic Interventions: Assess for all discharge needs, 1 to 1 time with Social worker, Explore available resources and support systems, Assess for adequacy in community support network, Educate family and significant other(s) on suicide prevention, Complete Psychosocial Assessment, Interpersonal group therapy.  Evaluation of Outcomes: Adequate for discharge    Progress in Treatment: Attending groups: Yes. Participating in groups: Yes. Taking medication as prescribed: Yes. Toleration medication: Yes. Family/Significant other contact made: Contact attempts made with pt's mother. [facility] pamphlet and mobile crisis information provided to pt.  Patient understands diagnosis: Yes. Discussing patient identified problems/goals with staff: Yes. Medical problems stabilized or resolved: Yes. Denies suicidal/homicidal ideation: Yes. Issues/concerns per patient self-inventory: No. Other: none  New problem(s) identified: No, Describe:  none  New Short Term/Long Term Goal(s): medication management for mood stabilization; development of comprehensive mental wellness plan, elimination of SI thoughts.   Discharge Plan or Barriers: [facility]  appts already scheduled in system. [facility] pamphlet provided to pt for additional community support.   Reason for Continuation of Hospitalization: none  Estimated Length of Stay: 10/31/17  Attendees: Patient: 10/27/2017   Physician: Dr Jama Flavorsobos MD; Dr. Altamese Carolinaainville MD 10/27/2017   Nursing: Thelma BargeMarian, Penny RN 10/27/2017   RN Care Manager:x 10/27/2017   Social Worker:  Trula SladeHeather Smart, LCSW 10/27/2017   Recreational Therapist: x 10/27/2017   Other: Armandina StammerAgnes Nwoko NP; Feliz Beamravis Money NP 10/27/2017   Other:  10/27/2017   Other: 10/27/2017     Scribe for Treatment Team: Ledell PeoplesHeather N Smart, LCSW 10/31/2017 9:02 AM

## 2017-10-31 NOTE — Progress Notes (Signed)
Recreation Therapy Notes  Date: 10/31/17  Time: 0930 Location: 300 Hall Dayroom  Group Topic: Stress Management  Goal Area(s) Addresses:  Patient will verbalize importance of using healthy stress management.  Patient will identify positive emotions associated with healthy stress management.   Intervention: Stress Management  Activity :  Guided Imagery.  LRT introduced the stress management technique of guided imagery.  LRT read a script that allowed patients to envision lying in the sun and watching the clouds go by.  Patients were to listen and follow along as script was read.  Education:  Stress Management, Discharge Planning.   Education Outcome: Acknowledges edcuation/In group clarification offered/Needs additional education  Clinical Observations/Feedback: Pt did not attend group.    Caroll RancherMarjette Raymondo Garcialopez, LRT/CTRS         Lillia AbedLindsay, Haleemah Buckalew A 10/31/2017 11:45 AM

## 2017-10-31 NOTE — Progress Notes (Signed)
  [facility] :  Will you be returning to the same living situation after discharge:  Yes,  home At discharge, do you have transportation home?: Yes,  bus pass Do you have the ability to pay for your medications: Yes,  mental health  Release of information consent forms completed and submitted to medical records by CSW.  Patient to Follow up at: Follow-up Information    [facility] Follow up on 11/04/2017.   Specialty:  Behavioral Health Why:  Hospital follow-up at 8:15AM on Tuesday, 11/04/17. Thank you.  Contact information: [address] [place] [address] [phone]           Next level of care provider has access to [facility] Link:no  Safety Planning and Suicide Prevention discussed: Yes,  SPE completed with pt; contact attempts made with pt's mother. SPI pamphlet and Mobile crisis information provided to pt.   Have you used any form of tobacco in the last 30 days? (Cigarettes, Smokeless Tobacco, Cigars, and/or Pipes): Yes  Has patient been referred to the Quitline?: Patient refused referral  Patient has been referred for addiction treatment: Yes  Pulte HomesHeather N Smart, LCSW 10/31/2017, 9:02 AM

## 2017-10-31 NOTE — BHH Suicide Risk Assessment (Signed)
[facility]   Principal Problem: Substance induced mood disorder [facility](HCC) Discharge Diagnoses:  Patient Active Problem List   Diagnosis Date Noted  . Substance induced mood disorder (HCC) [F19.94] 10/26/2017  . MDD (major depressive disorder) [F32.9] 10/25/2017  . Atypical chest pain [R07.89] 08/07/2016  . Depression [F32.9] 08/07/2016  . HIV disease (HCC) [B20] 02/09/2015  . Bullous emphysema (HCC) [J43.9] 02/09/2015  . Musculoskeletal chest pain [R07.89] 02/09/2015  . Falling asleep and waking up too early in the evening [G47.00] 04/26/2014  . Sore throat [J02.9] 04/26/2014  . Sciatic pain [M54.30] 08/30/2013  . Low testosterone [R79.89] 08/30/2013  . Hemorrhoid [K64.9] 08/30/2013  . Blurry vision, bilateral [H53.8] 01/05/2013  . Lumbago [M54.5] 01/05/2013  . Weight loss [R63.4] 07/22/2012  . Face lacerations [S01.81XA] 07/22/2012  . Incarceration [Z65.1]   . Chronic hepatitis B without delta agent without hepatic coma (HCC) [B18.1] 07/11/2010  . CANNABIS ABUSE, EPISODIC [F12.20] 07/11/2010  . SMOKER [F17.200] 07/11/2010  . HIV INFECTION [B20] 06/26/2010    Total Time spent with patient: 30 minutes  Musculoskeletal: Strength & Muscle Tone: within normal limits Gait & Station: normal Patient leans: N/A  Psychiatric Specialty Exam: ROS no chest pain, no shortness of breath, no vomiting , no fever, no chills   Blood pressure 135/87, pulse 76, temperature 97.9 F (36.6 C), temperature source Oral, resp. rate 18, height 5\' 8"  (1.727 m), weight 69.9 kg (154 lb), SpO2 99 %.Body mass index is 23.42 kg/m.  General Appearance: Fairly Groomed  Patent attorneyye Contact::  Fair  Speech:  Normal Rate   Volume:  Normal  Mood:  improving   Affect:  mildly irritable   Thought Process:  Linear and Descriptions of Associations: Intact  Orientation:  Full (Time, Place, and Person)  Thought Content:  denies hallucinations, no delusions, not internally preoccupied , expresses  worry about insuring ongoing outpatient medication management, as he states in the past this has been challenging   Suicidal Thoughts:  No- denies any suicidal or self injurious ideations , denies any homicidal or violent ideations   Homicidal Thoughts:  No  Memory:  recent and remote grossly intact   Judgement:  Fair- improving   Insight:  Fair- improving   Psychomotor Activity:  Normal  Concentration:  Good  Recall:  Good  Fund of Knowledge:Good  Language: Good  Akathisia:  No  Handed:  Right  AIMS (if indicated):     Assets:  Communication Skills Desire for Improvement Resilience  Sleep:  Number of Hours: 6.75  Cognition: WNL  ADL's:  Intact   Mental Status Per Nursing Assessment::   On Admission:     Demographic Factors:  51 year old male, lives with mother, unemployed   Loss Factors: Unemployment, limited support network, chronic medical illness   Historical Factors: History of depression, history of Cocaine Abuse   Risk Reduction Factors:   Living with another person, especially a relative and Positive coping skills or problem solving skills  Continued Clinical Symptoms:  Patient presents improved compared to admission. Presents alert, attentive, mood is partially improved but remains anxious, today focused on concern that he will not be able to get his medications after discharge, which he states has occurred in the past . No thought disorder, no suicidal or self injurious ideations, no homicidal or violent ideations, no psychotic symptoms.  Denies medication side effects. I have reviewed patient's concerns regarding ensuring outpatient follow up and ongoing medication management following discharge. He will receive samples for Descovy,Prezcobix  x 3 days, and [facility]. I have spoken with ID consultant- scripts and support to obtain outpatient anti HIV medications will be addressed at his appointment . I have reviewed importance of keeping  said appointment with patient. For outpatient psychiatric medication management he will receive 7 days of samples and is [facility]  [facility], plans to go on 12/18, for ongoing care and medication management.   Cognitive Features That Contribute To Risk:  No gross cognitive deficits noted upon discharge. Is alert , attentive, and oriented x 3    Suicide Risk:  Mild:  Suicidal ideation of limited frequency, intensity, duration, and specificity.  There are no identifiable plans, no associated intent, mild dysphoria and related symptoms, good self-control (both objective and subjective assessment), few other risk factors, and identifiable protective factors, including available and accessible social support.  Follow-up Information    [facility] Follow up on 11/04/2017.   Specialty:  Behavioral Health Why:  Hospital follow-up at 8:15AM on Tuesday, 11/04/17. Thank you.  Contact information: [address] [place] [address] [phone]         * ID Clinic on 12/17 at 9,30 AM.   Plan Of Care/Follow-up recommendations:  Activity:  as tolerated  Diet:  Regular  Tests:  NA Other:  See below Patient is planning to go live with mother Follow up as above  Craige CottaFernando A Alif Petrak, MD 10/31/2017, 1:12 PM

## 2017-11-03 ENCOUNTER — Emergency Department (HOSPITAL_COMMUNITY)
Admission: EM | Admit: 2017-11-03 | Discharge: 2017-11-03 | Disposition: A | Discharge status: Home / Self Care | Payer: Self-pay | Attending: Emergency Medicine | Admitting: Emergency Medicine

## 2017-11-03 ENCOUNTER — Other Ambulatory Visit: Payer: Self-pay

## 2017-11-03 ENCOUNTER — Other Ambulatory Visit: Payer: Self-pay | Admitting: *Deleted

## 2017-11-03 ENCOUNTER — Ambulatory Visit (INDEPENDENT_AMBULATORY_CARE_PROVIDER_SITE_OTHER): Payer: Self-pay | Admitting: Pharmacist Clinician (PhC)/ Clinical Pharmacy Specialist

## 2017-11-03 ENCOUNTER — Telehealth: Payer: Self-pay | Admitting: *Deleted

## 2017-11-03 ENCOUNTER — Ambulatory Visit (INDEPENDENT_AMBULATORY_CARE_PROVIDER_SITE_OTHER): Payer: Self-pay | Admitting: Licensed Clinical Social Worker

## 2017-11-03 ENCOUNTER — Ambulatory Visit: Payer: Self-pay

## 2017-11-03 ENCOUNTER — Encounter (HOSPITAL_COMMUNITY): Payer: Self-pay | Admitting: Family Medicine

## 2017-11-03 DIAGNOSIS — R109 Unspecified abdominal pain: Secondary | ICD-10-CM | POA: Insufficient documentation

## 2017-11-03 DIAGNOSIS — Z23 Encounter for immunization: Secondary | ICD-10-CM

## 2017-11-03 DIAGNOSIS — Z5321 Procedure and treatment not carried out due to patient leaving prior to being seen by health care provider: Secondary | ICD-10-CM | POA: Insufficient documentation

## 2017-11-03 DIAGNOSIS — Z113 Encounter for screening for infections with a predominantly sexual mode of transmission: Secondary | ICD-10-CM

## 2017-11-03 DIAGNOSIS — B20 Human immunodeficiency virus [HIV] disease: Secondary | ICD-10-CM

## 2017-11-03 DIAGNOSIS — F1210 Cannabis abuse, uncomplicated: Secondary | ICD-10-CM

## 2017-11-03 DIAGNOSIS — Z79899 Other long term (current) drug therapy: Secondary | ICD-10-CM

## 2017-11-03 DIAGNOSIS — B181 Chronic viral hepatitis B without delta-agent: Secondary | ICD-10-CM

## 2017-11-03 LAB — CBC
HEMATOCRIT: 39.4 % (ref 39.0–52.0)
HEMOGLOBIN: 13.8 g/dL (ref 13.0–17.0)
MCH: 33.7 pg (ref 26.0–34.0)
MCHC: 35 g/dL (ref 30.0–36.0)
MCV: 96.1 fL (ref 78.0–100.0)
Platelets: 131 10*3/uL — ABNORMAL LOW (ref 150–400)
RBC: 4.1 MIL/uL — ABNORMAL LOW (ref 4.22–5.81)
RDW: 12.9 % (ref 11.5–15.5)
WBC: 5.7 10*3/uL (ref 4.0–10.5)

## 2017-11-03 LAB — COMPREHENSIVE METABOLIC PANEL
ALBUMIN: 3.9 g/dL (ref 3.5–5.0)
ALT: 83 U/L — ABNORMAL HIGH (ref 17–63)
ANION GAP: 7 (ref 5–15)
AST: 57 U/L — AB (ref 15–41)
Alkaline Phosphatase: 102 U/L (ref 38–126)
BILIRUBIN TOTAL: 0.5 mg/dL (ref 0.3–1.2)
BUN: 15 mg/dL (ref 6–20)
CHLORIDE: 104 mmol/L (ref 101–111)
CO2: 26 mmol/L (ref 22–32)
Calcium: 9 mg/dL (ref 8.9–10.3)
Creatinine, Ser: 1.23 mg/dL (ref 0.61–1.24)
GFR calc Af Amer: >60 mL/min (ref >60)
GLUCOSE: 125 mg/dL — AB (ref 65–99)
POTASSIUM: 3.9 mmol/L (ref 3.5–5.1)
Sodium: 137 mmol/L (ref 135–145)
TOTAL PROTEIN: 8.3 g/dL — AB (ref 6.5–8.1)

## 2017-11-03 LAB — LIPASE, BLOOD: LIPASE: 34 U/L (ref 11–51)

## 2017-11-03 MED ORDER — HYDROXYZINE HCL 25 MG PO TABS: 25.0000 mg | ORAL_TABLET | Freq: Three times a day (TID) | ORAL | 3 refills | Status: DC | PRN

## 2017-11-03 MED ORDER — MOMETASONE FURO-FORMOTEROL FUM 100-5 MCG/ACT IN AERO: 2.0000 | INHALATION_SPRAY | Freq: Two times a day (BID) | RESPIRATORY_TRACT | 5 refills | Status: DC

## 2017-11-03 MED ORDER — OLANZAPINE 10 MG PO TABS: 10.0000 mg | ORAL_TABLET | Freq: Every day | ORAL | 3 refills | Status: DC

## 2017-11-03 MED ORDER — DARUNAVIR-COBICISTAT 800-150 MG PO TABS: 1.0000 | ORAL_TABLET | Freq: Every day | ORAL | 3 refills | Status: DC

## 2017-11-03 MED ORDER — SERTRALINE HCL 50 MG PO TABS: 50.0000 mg | ORAL_TABLET | Freq: Every day | ORAL | 3 refills | Status: DC

## 2017-11-03 MED ORDER — EMTRICITABINE-TENOFOVIR AF 200-25 MG PO TABS: 1.0000 | ORAL_TABLET | Freq: Every day | ORAL | 3 refills | Status: DC

## 2017-11-03 NOTE — BH Specialist Note (Signed)
Integrated Behavioral Health Follow Up Visit  MRN: [id number] Name: Murtis SinkLeonard Ray Kisling  Number of Integrated Behavioral Health Clinician visits: 2/6 Session Start time: 11:38 am  Session End time: 12:20 pm Total time: 40 minutes  Type of Service: Integrated Behavioral Health- Individual/Family Interpretor:No. Interpretor Name and Language: N/A  SUBJECTIVE: Murtis SinkLeonard Ray Mabus is a 51 y.o. male accompanied by self Patient was referred by Pharmacy but is a patient of Dr. Daiva EvesVan Dam, for follow up from his discharge from the hospital.    [facility] and discharged on 12/14 to his mother's house.  Patient stated "It's good.  It's comfortable" to reside at his mother's house after being homeless.  Patient reported taking the following medications: Zoloft, Vistaril, and Zyprexa and reported compliance with the samples provided at discharge.  Additional 30 day supply of medications were also called [facility] the [facility].  Patient reported that he was provided with follow-[facility] mental [facility] at 9:15 am.  Patient reported that he intended to attend and has their contact information.  Patient denied current suicidal ideations, plan or intent and stated that he was provided with Safety Plan from [facility] on what to do if ideations return.  [facility] encouraged patient to report directly to the ED if suicidal ideations return, rather than [facility] and patient was receptive.  Patient also presented with coherent thought process and logical thought content and had his medication samples, discharge paperwork, and referral to [facility] organized together.  Patient inferred that the Marijuana that he smoked the night before with a friend was laced with Cocaine.  Patient was happy to report that he has not smoked cigarettes or Marijuana since his discharge and stated that he has been turning down offers from his peers.  Patient was able to verbalize a recovery plan to the [facility] of  removing himself from people and places to remain abstinent.  Patient asked for assistance in obtaining a Nicotine patch for his sobriety and the [facility] referred the patient back to medical for a prescription and also provided a handout on smoking cessation programming and support.  [facility] also offered a referral for substance treatment, however the patient denied at this time.  At end of the visit, [facility] spoke with Pharmcay about patient's concern about obtaining an orange card and obtaining a Nicotine patch.  Duration of problem: past month; Severity of problem: moderate  OBJECTIVE: Mood: Depressed and Affect: Appropriate Risk of harm to self or others: No plan to harm self or others  LIFE CONTEXT: Family and Social: Patient is currently living with his mother, but was homeless before his hospitalization. School/Work: Patient does not work and lacks finances. Self-Care: Patient is able to tend to his ADL's. Life Changes: Patient expressed lack of family support prior to his admission and inability to get treatment for his medical condition, which are stressors.  ASSESSMENT: Patient is currently denying suicidal ideations, plan, or intent, but still presents with depressed mood.  Patient may benefit from behavioral health services, psychiatric assessment, and medication management.  GOALS ADDRESSED: Patient will: 1.  Reduce symptoms of: depression and stress  2.  Increase knowledge and/or ability of: coping skills and stress reduction  3.  Demonstrate ability to: Increase healthy adjustment to current life circumstances, Increase adequate support systems for patient/family and Decrease self-medicating behaviors  INTERVENTIONS: Interventions utilized:  Supportive Counseling, Psychoeducation and/or Health Education and Link to [facility]  PLAN: 1. [facility] for behavioral health appointment.  Keep appointment or contact them to reschedule. 2. Behavioral  recommendations: Utilize safety plan developed [facility], if become suicidal and report to the nearest ED.  Attend Peer Groups at [facility] for social engagement, support and psychoeducation. 3. Referral(s): Substance Abuse Program and Community Resources:  [facility] Case Manager Cassie for permanent housing assistance 4. "From scale of 1-10, how likely are you to follow plan?": 10  Vergia AlbertsSherry Melora Menon, [facility]

## 2017-11-03 NOTE — ED Triage Notes (Signed)
[facility] [employer] [facility]. Per EMS, patient is complaining of upper abd pain that started a month ago.

## 2017-11-03 NOTE — Telephone Encounter (Signed)
[facility] for patient to establish care with a primary MD. He saw Curtis SouthwardMinh Pham, pharm D today and he is aware that this referral was made. Once he is scheduled, I will contact patient with appt date and time. Wendall MolaJacqueline Tykira Wachs

## 2017-11-03 NOTE — Progress Notes (Signed)
HPI: Curtis Hodge is a 51 y.o. male his here to see pharmacy due to his long hx of non-compliance.   Allergies: No Known Allergies  Vitals:    Past Medical History: Past Medical History:  Diagnosis Date  . Atypical chest pain 08/07/2016  . Depression 08/07/2016  . Emphysema/COPD (HCC)   . Gout   . Hepatitis B    HepC  . HIV (human immunodeficiency virus infection) (HCC)   . Incarceration     Social History: Social History   Socioeconomic History  . Marital status: Single    Spouse name: Not on file  . Number of children: Not on file  . Years of education: Not on file  . Highest education level: Not on file  Social Needs  . Financial resource strain: Not on file  . Food insecurity - worry: Not on file  . Food insecurity - inability: Not on file  . Transportation needs - medical: Not on file  . Transportation needs - non-medical: Not on file  Occupational History  . Not on file  Tobacco Use  . Smoking status: Current Every Day Smoker    Packs/day: 0.50    Types: Cigarettes    Last attempt to quit: 05/02/2016    Years since quitting: 1.5  . Smokeless tobacco: Never Used  Substance and Sexual Activity  . Alcohol use: Yes    Comment: Weekends.   . Drug use: Yes    Frequency: 5.0 times per week    Types: Marijuana    Comment: Yesterday   . Sexual activity: Not on file    Comment: given condoms  Other Topics Concern  . Not on file  Social History Narrative  . Not on file    Previous Regimen: TRV/DRV/r  Current Regimen: Prezcobix/Descovy  Labs: HIV 1 RNA Quant (copies/mL)  Date Value  10/29/2017 76,400  10/07/2016 57 (H)  07/25/2016 24,034 (H)   CD4 T Cell Abs (/uL)  Date Value  10/23/2017 210 (L)  07/25/2016 310 (L)  02/09/2015 100 (L)   Hep B S Ab (no units)  Date Value  06/26/2010 INDETER (A)   Hepatitis B Surface Ag (no units)  Date Value  06/26/2010 POS (A)   HCV Ab (no units)  Date Value  06/26/2010 NEG    CrCl: Estimated  Creatinine Clearance: 66.3 mL/min (A) (by C-G formula based on SCr of 1.29 mg/dL (H)).  Lipids:    Component Value Date/Time   CHOL 131 02/09/2015 1225   TRIG 67 02/09/2015 1225   HDL 21 (L) 02/09/2015 1225   CHOLHDL 6.2 02/09/2015 1225   VLDL 13 02/09/2015 1225   LDLCALC 97 02/09/2015 1225    Assessment: [name] has had a long hx of adherence issue along with depression and drug use. He has had multiple no shows. He was recently here as a walk in to see Cordelia PenSherry due to some suicide ideation. He was then admitted to behavior health. He lacks a lot of social support system. However he still uses drugs. He was positive for cocaine while a behavioral health.   The reason for being non-compliance was his niece threw away his meds. I just don't buy it. He has moved to a new place now. He stated that it takes him "40" minutes to walk to the bus stop. He would like [employer]. Told him it doesn't work that way because he would need medicaid for that. I'll try to reach out to Ambre to see if she can do  anything.   He was discharged from behavioral health with a bunch of printed prescriptions but he didn't know where to get them. I re-routed all of them to [facility] [address] now because his ADAP is approved. ADAP will not cover his Elwin SleightDulera though so Morrie Sheldonshley will do the paper to [facility]. He will pick up today and will request them mailed from now on. Cordelia PenSherry will also meet with him today for some counseling. He'll come back to see me in about a month for labs and any meds adjustments before seeing Dr. Daiva EvesVan Dam. We will give him a couple of bus passes today. He also has hep B so we will get labs today also.   Recommendations:  Restart Prezcobix/Descovy Hep B labs F/u with pharmacy in 1 month Menveo #1 today   Curtis Hodge, PharmD, BCPS, AAHIVP, CPP Clinical Infectious Disease Pharmacist [facility] 11/03/2017, 11:11 AM

## 2017-11-04 LAB — LIPID PANEL
CHOL/HDL RATIO: 4.1 (calc) (ref <5.0)
Cholesterol: 164 mg/dL (ref <200)
HDL: 40 mg/dL — AB (ref >40)
LDL Cholesterol (Calc): 103 mg/dL (calc) — ABNORMAL HIGH
NON-HDL CHOLESTEROL (CALC): 124 mg/dL (ref <130)
Triglycerides: 111 mg/dL (ref <150)

## 2017-11-04 LAB — CBC WITH DIFFERENTIAL/PLATELET
BASOS PCT: 0.3 %
Basophils Absolute: 10 cells/uL (ref 0–200)
EOS PCT: 0.6 %
Eosinophils Absolute: 19 cells/uL (ref 15–500)
HEMATOCRIT: 38.4 % — AB (ref 38.5–50.0)
HEMOGLOBIN: 13.5 g/dL (ref 13.2–17.1)
LYMPHS ABS: 1290 {cells}/uL (ref 850–3900)
MCH: 32.6 pg (ref 27.0–33.0)
MCHC: 35.2 g/dL (ref 32.0–36.0)
MCV: 92.8 fL (ref 80.0–100.0)
MPV: 11.3 fL (ref 7.5–12.5)
Monocytes Relative: 7.9 %
NEUTROS PCT: 50.9 %
Neutro Abs: 1629 cells/uL (ref 1500–7800)
Platelets: 139 10*3/uL — ABNORMAL LOW (ref 140–400)
RBC: 4.14 10*6/uL — AB (ref 4.20–5.80)
RDW: 12.7 % (ref 11.0–15.0)
Total Lymphocyte: 40.3 %
WBC: 3.2 10*3/uL — AB (ref 3.8–10.8)
WBCMIX: 253 {cells}/uL (ref 200–950)

## 2017-11-04 LAB — COMPLETE METABOLIC PANEL WITH GFR
AG RATIO: 0.9 (calc) — AB (ref 1.0–2.5)
ALBUMIN MSPROF: 3.7 g/dL (ref 3.6–5.1)
ALT: 72 U/L — ABNORMAL HIGH (ref 9–46)
AST: 51 U/L — ABNORMAL HIGH (ref 10–35)
Alkaline phosphatase (APISO): 98 U/L (ref 40–115)
BUN: 17 mg/dL (ref 7–25)
CALCIUM: 9.3 mg/dL (ref 8.6–10.3)
CO2: 27 mmol/L (ref 20–32)
CREATININE: 1.24 mg/dL (ref 0.70–1.33)
Chloride: 105 mmol/L (ref 98–110)
GFR, EST AFRICAN AMERICAN: 78 mL/min/{1.73_m2} (ref >=60)
GFR, EST NON AFRICAN AMERICAN: 67 mL/min/{1.73_m2} (ref >=60)
GLOBULIN: 4.1 g/dL — AB (ref 1.9–3.7)
Glucose, Bld: 105 mg/dL — ABNORMAL HIGH (ref 65–99)
POTASSIUM: 3.9 mmol/L (ref 3.5–5.3)
SODIUM: 138 mmol/L (ref 135–146)
TOTAL PROTEIN: 7.8 g/dL (ref 6.1–8.1)
Total Bilirubin: 0.6 mg/dL (ref 0.2–1.2)

## 2017-11-04 LAB — T-HELPER CELL (CD4) - (RCID CLINIC ONLY)
CD4 % Helper T Cell: 18 % — ABNORMAL LOW (ref 33–55)
CD4 T CELL ABS: 230 /uL — AB (ref 400–2700)

## 2017-11-04 LAB — RPR: RPR: NONREACTIVE

## 2017-11-04 LAB — URINE CYTOLOGY ANCILLARY ONLY
CHLAMYDIA, DNA PROBE: NEGATIVE
NEISSERIA GONORRHEA: NEGATIVE

## 2017-11-05 LAB — HEPATITIS B E ANTIGEN: HEP B E AG: REACTIVE — AB

## 2017-11-05 LAB — HEPATITIS B E ANTIBODY: Hep B E Ab: NONREACTIVE

## 2017-11-05 LAB — HIV-1 RNA QUANT-NO REFLEX-BLD
HIV 1 RNA QUANT: 4790 {copies}/mL — AB
HIV-1 RNA QUANT, LOG: 3.68 {Log_copies}/mL — AB

## 2017-11-07 LAB — HEPATITIS B DNA, ULTRAQUANTITATIVE, PCR
HEPATITIS B DNA (CALC): 6.15 {Log_IU}/mL — AB
HEPATITIS B DNA: 1400000 [IU]/mL — AB

## 2017-11-07 LAB — HIV GENOSURE(R) MG

## 2017-11-07 LAB — REFLEX TO GENOSURE(R) MG EDI: HIV GenoSure(R): 1

## 2017-11-07 LAB — HIV-1 RNA, PCR (GRAPH) RFX/GENO EDI
HIV-1 RNA BY PCR: 30500 {copies}/mL
HIV-1 RNA QUANT, LOG: 4.484 {Log_copies}/mL

## 2017-11-20 ENCOUNTER — Telehealth: Payer: Self-pay | Admitting: *Deleted

## 2017-11-20 NOTE — Telephone Encounter (Signed)
Referral received stating the patient has a long history of struggling with medication adherence. He may also have transportation and access to medication concerns. Contacted patient and left a message asking him to please give me a call. I would love to follow up with him to ensure we are addressing any barriers to care. Number left a this time

## 2017-12-11 ENCOUNTER — Telehealth: Payer: Self-pay | Admitting: *Deleted

## 2017-12-11 NOTE — Telephone Encounter (Signed)
Referral received stating the patient has a long history of struggling with medication adherence. He may also have transportation and access to medication concerns. Contacted patient and left a message asking him to please give me a call. I would love to follow up with him to ensure we are addressing any barriers to care. Number left a this time 

## 2017-12-17 ENCOUNTER — Ambulatory Visit: Payer: Self-pay

## 2018-06-23 ENCOUNTER — Other Ambulatory Visit: Payer: Self-pay

## 2018-06-23 ENCOUNTER — Other Ambulatory Visit: Payer: Self-pay | Admitting: *Deleted

## 2018-06-23 DIAGNOSIS — B20 Human immunodeficiency virus [HIV] disease: Secondary | ICD-10-CM

## 2018-06-26 ENCOUNTER — Encounter (HOSPITAL_COMMUNITY): Payer: Self-pay | Admitting: *Deleted

## 2018-06-26 ENCOUNTER — Emergency Department (HOSPITAL_COMMUNITY): Payer: Medicaid Other

## 2018-06-26 ENCOUNTER — Emergency Department (HOSPITAL_COMMUNITY)
Admission: EM | Admit: 2018-06-26 | Discharge: 2018-06-26 | Disposition: A | Discharge status: Home / Self Care | Payer: Medicaid Other | Attending: Emergency Medicine | Admitting: Emergency Medicine

## 2018-06-26 ENCOUNTER — Other Ambulatory Visit: Payer: Self-pay

## 2018-06-26 DIAGNOSIS — R1012 Left upper quadrant pain: Secondary | ICD-10-CM | POA: Diagnosis present

## 2018-06-26 DIAGNOSIS — N23 Unspecified renal colic: Secondary | ICD-10-CM | POA: Insufficient documentation

## 2018-06-26 DIAGNOSIS — Z79899 Other long term (current) drug therapy: Secondary | ICD-10-CM | POA: Insufficient documentation

## 2018-06-26 DIAGNOSIS — N200 Calculus of kidney: Secondary | ICD-10-CM | POA: Insufficient documentation

## 2018-06-26 DIAGNOSIS — F17210 Nicotine dependence, cigarettes, uncomplicated: Secondary | ICD-10-CM | POA: Diagnosis not present

## 2018-06-26 LAB — URINALYSIS, ROUTINE W REFLEX MICROSCOPIC
Bacteria, UA: NONE SEEN
Bilirubin Urine: NEGATIVE
Glucose, UA: NEGATIVE mg/dL
Ketones, ur: 20 mg/dL — AB
Leukocytes, UA: NEGATIVE
Nitrite: NEGATIVE
Protein, ur: 30 mg/dL — AB
RBC / HPF: >50 RBC/hpf — ABNORMAL HIGH (ref 0–5)
Specific Gravity, Urine: >1.046 — ABNORMAL HIGH (ref 1.005–1.030)
pH: 5 (ref 5.0–8.0)

## 2018-06-26 LAB — CBC
HCT: 40.9 % (ref 39.0–52.0)
Hemoglobin: 13.6 g/dL (ref 13.0–17.0)
MCH: 32.3 pg (ref 26.0–34.0)
MCHC: 33.3 g/dL (ref 30.0–36.0)
MCV: 97.1 fL (ref 78.0–100.0)
Platelets: 111 10*3/uL — ABNORMAL LOW (ref 150–400)
RBC: 4.21 MIL/uL — ABNORMAL LOW (ref 4.22–5.81)
RDW: 12.8 % (ref 11.5–15.5)
WBC: 5.3 10*3/uL (ref 4.0–10.5)

## 2018-06-26 LAB — COMPREHENSIVE METABOLIC PANEL
ALT: 37 U/L (ref 0–44)
AST: 79 U/L — ABNORMAL HIGH (ref 15–41)
Albumin: 3.8 g/dL (ref 3.5–5.0)
Alkaline Phosphatase: 95 U/L (ref 38–126)
Anion gap: 11 (ref 5–15)
BUN: 18 mg/dL (ref 6–20)
CO2: 26 mmol/L (ref 22–32)
Calcium: 9.2 mg/dL (ref 8.9–10.3)
Chloride: 102 mmol/L (ref 98–111)
Creatinine, Ser: 1.39 mg/dL — ABNORMAL HIGH (ref 0.61–1.24)
GFR calc Af Amer: >60 mL/min (ref >60)
GFR calc non Af Amer: 57 mL/min — ABNORMAL LOW (ref >60)
Glucose, Bld: 115 mg/dL — ABNORMAL HIGH (ref 70–99)
Potassium: 4.3 mmol/L (ref 3.5–5.1)
Sodium: 139 mmol/L (ref 135–145)
Total Bilirubin: 1.1 mg/dL (ref 0.3–1.2)
Total Protein: 8.6 g/dL — ABNORMAL HIGH (ref 6.5–8.1)

## 2018-06-26 LAB — LIPASE, BLOOD: Lipase: 34 U/L (ref 11–51)

## 2018-06-26 MED ORDER — ONDANSETRON HCL 4 MG PO TABS: 4.0000 mg | ORAL_TABLET | Freq: Four times a day (QID) | ORAL | 0 refills | Status: DC

## 2018-06-26 MED ORDER — KETOROLAC TROMETHAMINE 15 MG/ML IJ SOLN
15.0000 mg | Freq: Once | INTRAMUSCULAR | Status: AC
  Administered 2018-06-26: 15 mg via INTRAVENOUS
  Filled 2018-06-26: qty 1

## 2018-06-26 MED ORDER — IOHEXOL 300 MG/ML  SOLN
100.0000 mL | Freq: Once | INTRAMUSCULAR | Status: AC
Start: 2018-06-26 — End: 2018-06-26
  Administered 2018-06-26: 100 mL via INTRAVENOUS

## 2018-06-26 MED ORDER — HYDROMORPHONE HCL 1 MG/ML IJ SOLN
1.0000 mg | Freq: Once | INTRAMUSCULAR | Status: AC
  Administered 2018-06-26: 1 mg via INTRAVENOUS
  Filled 2018-06-26: qty 1

## 2018-06-26 MED ORDER — OXYCODONE-ACETAMINOPHEN 5-325 MG PO TABS: 1.0000 | ORAL_TABLET | Freq: Four times a day (QID) | ORAL | 0 refills | Status: DC | PRN

## 2018-06-26 MED ORDER — ONDANSETRON HCL 4 MG/2ML IJ SOLN
4.0000 mg | Freq: Once | INTRAMUSCULAR | Status: AC
  Administered 2018-06-26: 4 mg via INTRAVENOUS
  Filled 2018-06-26: qty 2

## 2018-06-26 NOTE — Discharge Instructions (Signed)
Take ibuprofen 600 mg every 6 hours as needed for pain. It it safe to take with other pain medications. Return to ER for uncontrolled pain/nausea or fever. Follow-up with urology for persistent pain otherwise. Follow-up with ID as soon as you can to re-establish care.

## 2018-06-26 NOTE — ED Provider Notes (Signed)
[facility] EMERGENCY DEPARTMENT Provider Note   CSN: [id number] Arrival date & time: 06/26/18  0720     History   Chief Complaint Chief Complaint  Patient presents with  . Abdominal Pain  . Shortness of Breath    HPI Curtis Hodge is a 52 y.o. male.  HPI   52 year old male with abdominal pain.  Gradual onset 1 week ago.  Progressively worsening since then.  Pain is primarily in the left upper quadrant.  Constant now.  Pain is worse with certain movements and with deep breathing.  He is not sure if he is actually short of breath or is is having pain with breathing.  Nausea vomiting last night.  No fevers.  No urinary complaints.  No change in stool.  Patient reports that he was recently released from prison.  He has been off his medications including his HIV meds for approximately 3 months.  Past Medical History:  Diagnosis Date  . Atypical chest pain 08/07/2016  . Depression 08/07/2016  . Emphysema/COPD (HCC)   . Gout   . Hepatitis B    HepC  . HIV (human immunodeficiency virus infection) (HCC)   . Incarceration     Patient Active Problem List   Diagnosis Date Noted  . Substance induced mood disorder (HCC) 10/26/2017  . MDD (major depressive disorder) 10/25/2017  . Atypical chest pain 08/07/2016  . Depression 08/07/2016  . HIV disease (HCC) 02/09/2015  . Bullous emphysema (HCC) 02/09/2015  . Musculoskeletal chest pain 02/09/2015  . Falling asleep and waking up too early in the evening 04/26/2014  . Sore throat 04/26/2014  . Sciatic pain 08/30/2013  . Low testosterone 08/30/2013  . 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 Blurry vision, bilateral 01/05/2013  . Lumbago 01/05/2013  . Weight loss 07/22/2012  . Face lacerations 07/22/2012  . Incarceration   . Chronic hepatitis B without delta agent without hepatic coma (HCC) 07/11/2010  . CANNABIS ABUSE, EPISODIC 07/11/2010    . SMOKER 07/11/2010  . HIV INFECTION 06/26/2010    Past Surgical History:  Procedure Laterality Date  . AMPUTATION Right 02/04/2017   Procedure: RIGHT SMALL FINGER RAY AMPUTATION;  Surgeon: [name], MD;  Location: [facility];  Service: Orthopedics;  Laterality: Right;  . FLEXOR TENDON REPAIR Right 02/27/2016   Procedure: RIGHT SMALL FINGER FLEXOR TENDON REPAIR;  Surgeon: [name], MD;  Location: [facility];  Service: Orthopedics;  Laterality: Right;  . I&D EXTREMITY  05/21/2012   Procedure: IRRIGATION AND DEBRIDEMENT EXTREMITY;  Surgeon: [name], MD;  Location: [facility] OR;  Service: Orthopedics;  Laterality: Right;        Home Medications    Prior to Admission medications   Medication Sig Start Date End Date Taking? Authorizing Provider  darunavir-cobicistat (PREZCOBIX) 800-150 MG tablet Take 1 tablet by mouth daily. For HIV, Swallow whole. Do NOT crush, break or chew tablets. Take with food. 11/03/17   Randall Hiss, MD  emtricitabine-tenofovir AF (DESCOVY) 200-25 MG tablet Take 1 tablet by mouth daily. For HIV 11/03/17   Daiva Eves, Lisette Grinder, MD  hydrOXYzine (ATARAX/VISTARIL) 25 MG tablet Take 1 tablet (25 mg total) by mouth 3 (three) times daily as needed for anxiety. 11/03/17   Randall Hiss, MD  mometasone-formoterol [facility]) 100-5 MCG/ACT AERO Inhale 2 puffs into the lungs 2 (two) times daily. Forbreathing 11/03/17   Nicholes Calamity, RPH-CPP  OLANZapine (ZYPREXA) 10 MG tablet Take 1 tablet (10 mg total) by mouth at bedtime. For mood control 11/03/17   Daiva Eves, Lisette Grinder, MD  sertraline (ZOLOFT) 50 MG tablet Take 1 tablet (50 mg total) by mouth daily. For mood control 11/03/17   Daiva Eves, Lisette Grinder, MD  omeprazole (PRILOSEC) 20 MG capsule Take 1 capsule (20 mg total) by mouth daily. Patient not taking: Reported on 05/29/2015 05/24/15 09/27/15  Horton, Mayer Masker, MD    Family History History reviewed. No pertinent family  history.  Social History Social History   Tobacco Use  . Smoking status: Current Every Day Smoker    Packs/day: 0.50    Types: Cigarettes    Last attempt to quit: 05/02/2016    Years since quitting: 2.1  . Smokeless tobacco: Never Used  Substance Use Topics  . Alcohol use: Yes    Comment: "Very, very, rarely".   . Drug use: Yes    Frequency: 5.0 times per week    Types: Marijuana    Comment: Today      Allergies   Patient has no known allergies.   Review of Systems Review of Systems  All systems reviewed and negative, other than as noted in HPI.  Physical Exam Updated Vital Signs BP (!) 154/103 (BP Location: Right Arm)   Pulse 64   Temp 98.6 F (37 C) (Oral)   Resp 20   SpO2 97%   Physical Exam  Constitutional: He appears well-developed and well-nourished. No distress.  HENT:  Head: Normocephalic and atraumatic.  Eyes: Conjunctivae are normal. Right eye exhibits no discharge. Left eye exhibits no discharge.  Neck: Neck supple.  Cardiovascular: Normal rate, regular rhythm and normal heart sounds. Exam reveals no gallop and no friction rub.  No murmur heard. Pulmonary/Chest: Effort normal and breath sounds normal. No respiratory distress.  Abdominal: Soft. He exhibits no distension. There is tenderness.  Left upper quadrant tenderness without rebound or guarding.  No distention.  Musculoskeletal: He exhibits no edema or tenderness.  Neurological: He is alert.  Skin: Skin is warm and dry.  Psychiatric: He has a normal mood and affect. His behavior is normal. Thought content normal.  Nursing note and vitals reviewed.    ED Treatments / Results  Labs (all labs ordered are listed, but only abnormal results are displayed) Labs Reviewed  COMPREHENSIVE METABOLIC PANEL - Abnormal; Notable for the following components:      Result Value   Glucose, Bld 115 (*)    Creatinine, Ser 1.39 (*)    Total Protein 8.6 (*)    AST 79 (*)    GFR calc non Af Amer 57 (*)     All other components within normal limits  CBC - Abnormal; Notable for the following components:   RBC 4.21 (*)    Platelets 111 (*)    All other components within normal limits  URINALYSIS, ROUTINE W REFLEX MICROSCOPIC - Abnormal; Notable for the following components:   APPearance HAZY (*)    Specific Gravity, Urine >1.046 (*)    Hgb urine dipstick LARGE (*)    Ketones, ur 20 (*)    Protein, ur 30 (*)    RBC / HPF >50 (*)    All other components within normal limits  LIPASE, BLOOD    EKG EKG Interpretation  Date/Time:  Friday June 26 2018 07:23:46 EDT Ventricular Rate:  59 PR Interval:  120 QRS Duration: 86 QT Interval:  468 QTC Calculation: 463 R Axis:  79 Text Interpretation:  Sinus bradycardia Otherwise normal ECG Confirmed by Raeford RazorKohut, Abdulkadir Emmanuel [phone]([id number]) on 06/26/2018 7:38:45 AM   Radiology No results found.  Procedures Procedures (including critical care time)  Medications Ordered in ED Medications  HYDROmorphone (DILAUDID) injection 1 mg (has no administration in time range)  ondansetron (ZOFRAN) injection 4 mg (has no administration in time range)     Initial Impression / Assessment and Plan / ED Course  I have reviewed the triage vital signs and the nursing notes.  Pertinent labs & imaging results that were available during my care of the patient were reviewed by me and considered in my medical decision making (see chart for details).     52 year old male with left-sided abdominal pain.  CT significant for left ureteral stone.  Work-up otherwise fairly reassuring.  Symptoms much improved with meds here in the emergency room.  Continued symptomatic treatment and expectant management.  Urgent return precautions were discussed.  Outpatient follow-up with urology otherwise.  Advised to reestablish care with infectious disease soon as he can as well.  Final Clinical Impressions(s) / ED Diagnoses   Final diagnoses:  Ureteral colic  Kidney stone    ED Discharge  Orders    None       Raeford RazorKohut, Lovett Coffin, MD 06/28/18 1042

## 2018-06-26 NOTE — ED Notes (Signed)
Urine culture sent to lab with UA. 

## 2018-06-26 NOTE — ED Notes (Signed)
Patient transported to CT 

## 2018-06-26 NOTE — ED Notes (Signed)
Patient transported to X-ray 

## 2018-06-26 NOTE — ED Triage Notes (Signed)
Pt in c/o abdominal pain that started a week ago, developed shortness of breath last night and thinks its from his abdominal pain, reports n/v last night, speaking in full sentences in triage without distress

## 2018-07-13 ENCOUNTER — Telehealth: Payer: Self-pay | Admitting: *Deleted

## 2018-07-13 ENCOUNTER — Ambulatory Visit (INDEPENDENT_AMBULATORY_CARE_PROVIDER_SITE_OTHER): Payer: Self-pay | Admitting: Licensed Clinical Social Worker

## 2018-07-13 ENCOUNTER — Ambulatory Visit (INDEPENDENT_AMBULATORY_CARE_PROVIDER_SITE_OTHER): Payer: Self-pay | Admitting: Infectious Diseases

## 2018-07-13 ENCOUNTER — Ambulatory Visit: Payer: Self-pay | Admitting: *Deleted

## 2018-07-13 VITALS — BP 134/84 | HR 82 | Temp 98.2°F | Wt 151.0 lb

## 2018-07-13 DIAGNOSIS — R634 Abnormal weight loss: Secondary | ICD-10-CM

## 2018-07-13 DIAGNOSIS — N1330 Unspecified hydronephrosis: Secondary | ICD-10-CM

## 2018-07-13 DIAGNOSIS — J439 Emphysema, unspecified: Secondary | ICD-10-CM

## 2018-07-13 DIAGNOSIS — B20 Human immunodeficiency virus [HIV] disease: Secondary | ICD-10-CM

## 2018-07-13 DIAGNOSIS — F331 Major depressive disorder, recurrent, moderate: Secondary | ICD-10-CM

## 2018-07-13 DIAGNOSIS — Z5900 Homelessness unspecified: Secondary | ICD-10-CM

## 2018-07-13 DIAGNOSIS — Z91199 Patient's noncompliance with other medical treatment and regimen due to unspecified reason: Secondary | ICD-10-CM

## 2018-07-13 DIAGNOSIS — Z0000 Encounter for general adult medical examination without abnormal findings: Secondary | ICD-10-CM

## 2018-07-13 DIAGNOSIS — B181 Chronic viral hepatitis B without delta-agent: Secondary | ICD-10-CM

## 2018-07-13 DIAGNOSIS — Z9119 Patient's noncompliance with other medical treatment and regimen: Secondary | ICD-10-CM

## 2018-07-13 MED ORDER — DARUN-COBIC-EMTRICIT-TENOFAF 800-150-200-10 MG PO TABS: 1.0000 | ORAL_TABLET | Freq: Every day | ORAL | 5 refills | Status: DC

## 2018-07-13 MED ORDER — MIRTAZAPINE 15 MG PO TABS: 15.0000 mg | ORAL_TABLET | Freq: Every day | ORAL | 5 refills | Status: DC

## 2018-07-13 MED FILL — MIRTAZAPINE 15 MG TABLET: 15 | 30 days supply | Qty: 30 | Fill #0

## 2018-07-13 MED FILL — SYMTUZA 800-150-200-10 MG T: 800-150-200 | 30 days supply | Qty: 30 | Fill #0

## 2018-07-13 NOTE — Patient Instructions (Signed)
Finish up your Descovy + Prezcobix. Once you are done please start taking Symtuza once daily with food.   Will start you on a medication for sleep and appetite stimulation called Remeron - once a day in the evening after dinner.   Please return in 3 weeks with our pharmacist to check on Medicaid status and we can give you more samples if needed while we wait.

## 2018-07-13 NOTE — Progress Notes (Signed)
Name: Curtis Hodge  DOB: 1966-05-06 MRN: [id number] PCP: Lavinia SharpsPlacey, Mary Ann, NP   Patient Active Problem List   Diagnosis Date Noted  . Homelessness 07/14/2018  . Healthcare maintenance 07/14/2018  . 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 MDD (major depressive disorder) 10/25/2017  . Atypical chest pain 08/07/2016  . HIV disease (HCC) 02/09/2015  . Bullous emphysema (HCC) 02/09/2015  . Sciatic pain 08/30/2013  . Blurry vision, bilateral 01/05/2013  . Weight loss 07/22/2012  . Incarceration   . Chronic hepatitis B without delta agent without hepatic coma (HCC) 07/11/2010  . CANNABIS ABUSE, EPISODIC 07/11/2010  . SMOKER 07/11/2010     Brief Narrative:  Curtis Hodge is a 52 y.o. male with HIV.   Previous Regimens: . 07-2016 - Prezcibox + Descovy (fell out of care)   Genotypes: . 01-2015: I - nevirapine, delavirdine  Subjective:   Curtis Hodge  is here today to re-establish care for his HIV disease. He has been off his ART during recent incarceration for about 2.5 months. He tells me that he tried to get the jail to set up an appointment but they "never would do it." He is now back on his Descovy + Prezcobix once daily but only has about 10 days left. Tolerating medications well and no complaints of s/e but concern over how he will access medications and pay for doctor visits. He has been approved for disability checks but not certain as to what to do next for Medicaid.   He has noticed progressive weight loss over time - down to 151 lbs now and would like to regain weight back. Reports poor appetite and poor sleep with "mind racing." Normal bowel movements, no nausea or vomiting. He does think he has had lower and RUQ abdominal pains since stopping his medications. History of hepatitis B but tells me he was cured of this.   ED visit recently for back/flank pain that is descried to be "coming and going and sharp." Makes him double over with pain sometimes and causes  abdomen to hurt. Says he had some kidney stones but no follow up with urology. Denies any fevers/chills. Is passing urine normally.   He has no primary care provider and multiple needs/requests for care regarding his depression/anxiety which is interrupting to his sleep and overall daily life; pulmonology/COPD and lung disease, housing/insurance.    Review of Systems  Constitutional: Positive for weight loss. Negative for chills, fever and malaise/fatigue.  HENT: Negative for sore throat.        No dental problems  Eyes: Negative for blurred vision.  Respiratory: Negative for cough and sputum production.   Cardiovascular: Negative for chest pain and leg swelling.  Gastrointestinal: Positive for abdominal pain. Negative for diarrhea, nausea and vomiting.  Genitourinary: Positive for flank pain. Negative for dysuria.  Musculoskeletal: Positive for back pain. Negative for joint pain, myalgias and neck pain.  Skin: Negative for rash.  Neurological: Negative for dizziness, tingling and headaches.  Psychiatric/Behavioral: Positive for depression. Negative for substance abuse. The patient is nervous/anxious and has insomnia.     Past Medical History:  Diagnosis Date  . Atypical chest pain 08/07/2016  . Depression 08/07/2016  . Emphysema/COPD (HCC)   . Gout   . Hepatitis B    HepC  . HIV (human immunodeficiency virus infection) (HCC)   . 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 Incarceration     Outpatient Medications Prior to Visit  Medication Sig Dispense Refill  . darunavir-cobicistat (PREZCOBIX) 800-150 MG tablet Take  1 tablet by mouth daily. For HIV, Swallow whole. Do NOT crush, break or chew tablets. Take with food. 30 tablet 3  . emtricitabine-tenofovir AF (DESCOVY) 200-25 MG tablet Take 1 tablet by mouth daily. For HIV 30 tablet 3  . hydrOXYzine (ATARAX/VISTARIL) 25 MG tablet Take 1 tablet (25 mg total) by mouth 3 (three) times daily as needed for anxiety. (Patient not taking: Reported  on 07/13/2018) 30 tablet 3  . mometasone-formoterol (DULERA) 100-5 MCG/ACT AERO Inhale 2 puffs into the lungs 2 (two) times daily. Forbreathing (Patient not taking: Reported on 07/13/2018) 1 Inhaler 5  . OLANZapine (ZYPREXA) 10 MG tablet Take 1 tablet (10 mg total) by mouth at bedtime. For mood control (Patient not taking: Reported on 07/13/2018) 30 tablet 3  . ondansetron (ZOFRAN) 4 MG tablet Take 1 tablet (4 mg total) by mouth every 6 (six) hours. (Patient not taking: Reported on 07/13/2018) 12 tablet 0  . oxyCODONE-acetaminophen (PERCOCET/ROXICET) 5-325 MG tablet Take 1-2 tablets by mouth every 6 (six) hours as needed for severe pain. (Patient not taking: Reported on 07/13/2018) 12 tablet 0  . sertraline (ZOLOFT) 50 MG tablet Take 1 tablet (50 mg total) by mouth daily. For mood control (Patient not taking: Reported on 07/13/2018) 30 tablet 3   No facility-administered medications prior to visit.      No Known Allergies  Social History   Tobacco Use  . Smoking status: Current Every Day Smoker    Packs/day: 0.50    Types: Cigarettes    Last attempt to quit: 05/02/2016    Years since quitting: 2.2  . Smokeless tobacco: Never Used  Substance Use Topics  . Alcohol use: Yes    Comment: "Very, very, rarely".   . Drug use: Yes    Frequency: 5.0 times per week    Types: Marijuana    Comment: Today     No family history on file.  Social History   Substance and Sexual Activity  Sexual Activity Not on file   Comment: given condoms     Objective:   Vitals:   07/13/18 0853  BP: 134/84  Pulse: 82  Temp: 98.2 F (36.8 C)  TempSrc: Oral  Weight: 151 lb (68.5 kg)   Body mass index is 22.96 kg/m.  Physical Exam  Constitutional: He is oriented to person, place, and time. No distress.  Chronically ill appearing. Well-developed but loose skin suggesting weight loss.   HENT:  Mouth/Throat: Oropharynx is clear and moist. No oral lesions. Normal dentition. No dental caries.  Eyes:  Pupils are equal, round, and reactive to light. No scleral icterus.  Cardiovascular: Normal rate, regular rhythm and normal heart sounds.  Pulmonary/Chest: Effort normal and breath sounds normal. No respiratory distress.  Abdominal: Soft. He exhibits no distension and no mass. There is no tenderness.  Genitourinary:  Genitourinary Comments: L CVA tenderness present.   Lymphadenopathy:    He has cervical adenopathy.       Right cervical: Superficial cervical adenopathy present.  Neurological: He is alert and oriented to person, place, and time.  Skin: Skin is warm and dry. No rash noted.  Psychiatric: His speech is normal and behavior is normal. Judgment normal. His mood appears anxious. Cognition and memory are normal.    Lab Results Lab Results  Component Value Date   WBC 5.3 06/26/2018   HGB 13.6 06/26/2018   HCT 40.9 06/26/2018   MCV 97.1 06/26/2018   PLT 111 (L) 06/26/2018    Lab Results  Component Value  Date   CREATININE 1.39 (H) 06/26/2018   BUN 18 06/26/2018   NA 139 06/26/2018   K 4.3 06/26/2018   CL 102 06/26/2018   CO2 26 06/26/2018    Lab Results  Component Value Date   ALT 37 06/26/2018   AST 79 (H) 06/26/2018   ALKPHOS 95 06/26/2018   BILITOT 1.1 06/26/2018    Lab Results  Component Value Date   CHOL 164 11/03/2017   HDL 40 (L) 11/03/2017   LDLCALC 103 (H) 11/03/2017   TRIG 111 11/03/2017   CHOLHDL 4.1 11/03/2017   HIV 1 RNA Quant (copies/mL)  Date Value  11/03/2017 4,790 (H)  10/29/2017 76,400  10/07/2016 57 (H)   CD4 T Cell Abs (/uL)  Date Value  07/13/2018 160 (L)  11/03/2017 230 (L)  10/23/2017 210 (L)     Assessment & Plan:   Problem List Items Addressed This Visit      Respiratory   Bullous emphysema (HCC)    Previously on Dulera for inhalers - with Symtuza there is a medication interaction with steroids that increase risk for adrenal insufficiency so will need to monitor. He is currently continuing to smoke cigarettes/cigars and  is worried about restarting medications for his COPD. I have asked him to stop smoking. He cannot afford inhalers now but will let me know when he gets Medicaid turned and will refill for him. Referral to pulmonology.         Digestive   Chronic hepatitis B without delta agent without hepatic coma (HCC)    No signs of acute hepatitis flare today on exam. LFTs elevated likely in the setting of medication withdrawal. Will check HBV DNA today. Counseled on not stopping his HIV medications as it is also treating his Hep B infection. Will reassess after a few months back on consistent therapy with TAF. Check Hep C with h/o incarceration and drug use.       Relevant Medications   Darunavir-Cobicisctat-Emtricitabine-Tenofovir Alafenamide (SYMTUZA) 800-150-200-10 MG TABS   sulfamethoxazole-trimethoprim (BACTRIM DS,SEPTRA DS) 800-160 MG tablet     Genitourinary   Hydroureteronephrosis    Will place referral for urology for follow up. He is still passing urine normally - precautions to return to ED if pain worsens or he experiences decreased urine production.       Relevant Orders   Ambulatory referral to Urology     Other   Weight loss    He is struggling with poor appetite and poor sleep - will start Remeron 15 mg QHS.       Relevant Medications   mirtazapine (REMERON) 15 MG tablet   Homelessness    Patient with multiple needs for resources including transportation, housing, Medicaid/insurance coverage. I introduced him to [place] with [facility] today for assistance. Will also place referral to Mitch and [facility] to re-engage back in care.       HIV disease (HCC) - Primary    Off medications for several months. Will consolidate to STR with Symtuza; he is very happy to hear this. Will check CD4 count and viral load today. Counseled on adherence. Will work on emergency drug access while we sort out HMAP/RW vs Medicaid. Referral to [facility] / Mitch for assistance and retaining in care. No concern for OI's  or AIDS defining components on exam.  He will return in 3 weeks to follow up with our pharmacy team to re-address insurance and provide with more samples if needed. He will follow up with me again in 6-8  weeks.       Relevant Medications   Darunavir-Cobicisctat-Emtricitabine-Tenofovir Alafenamide (SYMTUZA) 800-150-200-10 MG TABS   sulfamethoxazole-trimethoprim (BACTRIM DS,SEPTRA DS) 800-160 MG tablet   Other Relevant Orders   HIV 1 RNA quant-no reflex-bld   Urine cytology ancillary only   T-helper cell (CD4)- ([facility] clinic only) (Completed)   RPR (Completed)   Hepatitis B DNA, ultraquantitative, PCR   Hepatitis C antibody (Completed)   Healthcare maintenance    Ambulatory referral to primary care/internal medicine clinic for wellness/preventative care and chronic disease management. Will address vaccines once we get labs back.       Relevant Orders   Ambulatory referral to Internal Medicine     Meds ordered this encounter  Medications  . Darunavir-Cobicisctat-Emtricitabine-Tenofovir Alafenamide (SYMTUZA) 800-150-200-10 MG TABS    Sig: Take 1 tablet by mouth daily with breakfast.    Dispense:  30 tablet    Refill:  5    Order Specific Question:   Supervising Provider    Answer:   HATCHER, JEFFREY C [[id number]]  . mirtazapine (REMERON) 15 MG tablet    Sig: Take 1 tablet (15 mg total) by mouth at bedtime.    Dispense:  30 tablet    Refill:  5    Order Specific Question:   Supervising Provider    Answer:   HATCHER, JEFFREY C [[id number]]  . sulfamethoxazole-trimethoprim (BACTRIM DS,SEPTRA DS) 800-160 MG tablet    Sig: Take 1 tablet by mouth daily.    Dispense:  30 tablet    Refill:  2    Order Specific Question:   Supervising Provider    Answer:   HATCHER, JEFFREY C [[id number]]   Return in about 3 weeks (around 08/03/2018).  Greater than 40 minutes spent with the patient including 75% of time in face to face counsel of the patient re HIV, adherence counseling, referrals and in coordination of  their care.   [name], MSN, NP-C [facility] Pager: [phone] Office: [phone]  07/14/18  9:25 PM

## 2018-07-14 ENCOUNTER — Encounter: Payer: Self-pay | Admitting: Infectious Diseases

## 2018-07-14 DIAGNOSIS — Z5900 Homelessness unspecified: Secondary | ICD-10-CM | POA: Insufficient documentation

## 2018-07-14 DIAGNOSIS — Z0000 Encounter for general adult medical examination without abnormal findings: Secondary | ICD-10-CM | POA: Insufficient documentation

## 2018-07-14 DIAGNOSIS — N1330 Unspecified hydronephrosis: Secondary | ICD-10-CM

## 2018-07-14 HISTORY — DX: Unspecified hydronephrosis: N13.30

## 2018-07-14 LAB — T-HELPER CELL (CD4) - (RCID CLINIC ONLY)
CD4 % Helper T Cell: 13 % — ABNORMAL LOW (ref 33–55)
CD4 T Cell Abs: 160 /uL — ABNORMAL LOW (ref 400–2700)

## 2018-07-14 MED ORDER — SULFAMETHOXAZOLE-TRIMETHOPRIM 800-160 MG PO TABS: 1.0000 | ORAL_TABLET | Freq: Every day | ORAL | 2 refills | Status: DC

## 2018-07-14 NOTE — Assessment & Plan Note (Signed)
Off medications for several months. Will consolidate to STR with Symtuza; he is very happy to hear this. Will check CD4 count and viral load today. Counseled on adherence. Will work on emergency drug access while we sort out HMAP/RW vs Medicaid. Referral to [facility] / [facility] for assistance and retaining in care. No concern for OI's or AIDS defining components on exam.  He will return in 3 weeks to follow up with our pharmacy team to re-address insurance and provide with more samples if needed. He will follow up with me again in 6-8 weeks.

## 2018-07-14 NOTE — Assessment & Plan Note (Signed)
He is struggling with poor appetite and poor sleep - will start Remeron 15 mg QHS.

## 2018-07-14 NOTE — Assessment & Plan Note (Signed)
Ambulatory referral to primary care/internal medicine clinic for wellness/preventative care and chronic disease management. Will address vaccines once we get labs back.

## 2018-07-14 NOTE — Assessment & Plan Note (Addendum)
No signs of acute hepatitis flare today on exam. LFTs elevated likely in the setting of medication withdrawal. Will check HBV DNA today. Counseled on not stopping his HIV medications as it is also treating his Hep B infection. Will reassess after a few months back on consistent therapy with TAF. Check Hep C with h/o incarceration and drug use.

## 2018-07-14 NOTE — Assessment & Plan Note (Signed)
Previously on Dulera for inhalers - with Symtuza there is a medication interaction with steroids that increase risk for adrenal insufficiency so will need to monitor. He is currently continuing to smoke cigarettes/cigars and is worried about restarting medications for his COPD. I have asked him to stop smoking. He cannot afford inhalers now but will let me know when he gets Medicaid turned and will refill for him. Referral to pulmonology.

## 2018-07-14 NOTE — Assessment & Plan Note (Signed)
Patient with multiple needs for resources including transportation, housing, Medicaid/insurance coverage. I introduced him to [place] with [facility] today for assistance. Will also place referral to Mitch and [facility] to re-engage back in care.

## 2018-07-14 NOTE — BH Specialist Note (Signed)
Integrated Behavioral Health Initial Visit  MRN: [id number] Name: Curtis Hodge  Number of Integrated Behavioral Health Clinician visits:: 1/6 Session Start time: 11:30am Session End time: 11:50am Total time: 20 minutes  Type of Service: Integrated Behavioral Health- Individual/Family Interpretor:No. Interpretor Name and Language: n/a   Warm Hand Off Completed.       SUBJECTIVE: Curtis Hodge is a 52 y.o. male accompanied by self Patient was referred by [name] for depression and anger. Patient reports the following symptoms/concerns: depressed mood, helpless and hopeless feelings, anger outbursts, loss of weight, anxiety/nervousness Duration of problem: unknonwn; Severity of problem: moderate  OBJECTIVE: Mood: Depressed and Affect: Depressed Risk of harm to self or others: No plan to harm self or others  LIFE CONTEXT: Patient recently released from incarceration, he reports that he does not have a place to live and that although his disability has been approved he has not yet received it. He states that he never got the medications filled after release from [facility] several months ago, and that he has concerns about affording the medications as well as bus transportation to [place] to pick up the medications. 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 He reports he has not eaten since and often goes without food because he cannot afford it. Patient reports he has 4 children - ages 57, 35, 37 and 77 - who he has custody of 2 weeks a month and every weekend, but they have been unable to stay with him because he does not currently have housing.    GOALS ADDRESSED: Patient will: 1. Reduce symptoms of: agitation and depression  INTERVENTIONS: Interventions utilized:  Motivational Interviewing, Solution-Focused Strategies and Supportive Counseling   ASSESSMENT: Patient currently experiencing depressed mood, helpless and hopeless feelings, anger outbursts, loss of appetite/weight loss, anxiety which at times becomes overwhelming. He is not currently having thoughts of death and dying and states he has not lately. Patient describes his anger as explosive, but stemming from frustration at being unable to make things happen for himself and feelings of helplessness. The diagnosis most consistent with patient's endorsed symptoms at this time is Major Depressive Disorder, Recurrent, Moderated. Counselor will continue to assess for anxiety or impulse control disorders. Patient expressed anger and frustration about being unable to get answers from disability services and Medicaid about his case. He shared that he knows things will get better once his benefits come through, but he is struggling in the meantime. Counselor processed with patient how this interacts with his depressed and anxious feelings. Patient identified that he attempts to keep inside the disappointment he feels, but it ends up turning into anger at himself and feelings that he is worthless because he can't make things happen for himself. This leads to overwhelming anxiety and sometimes attacks where he cannot imagine that things will ever get better for him and struggles to catch his breath thinking about it all. Patient states that once he has reached this point, any little thing that frustrates him leads him to act out in anger. Counselor and patient brainstormed ways to address the anger before it gets to that point. Counselor educated patient on grounding techniques and reframing/positive self-talk. Patient is very focused on getting bus tickets necessary to get to pharmacy and back  so that he can begin his medication.     Patient may benefit from ongoing cognitive behavioral therapy.  PLAN: 1. Patient is  hesitant to make an appointment due to trouble with transportation. He plans to contact the bridge counselor to discuss transportation and make an appointment at that time.   Angus Palmsegina Kairi Tufo, LCSW

## 2018-07-14 NOTE — Progress Notes (Signed)
Patient CD4 count < 200. Please call him to let him know he needs to start taking bactrim antibiotic 1 tablet once a day to protect him from infections. I have sent in this medication to [facility] on [address].

## 2018-07-14 NOTE — Assessment & Plan Note (Signed)
Will place referral for urology for follow up. He is still passing urine normally - precautions to return to ED if pain worsens or he experiences decreased urine production.

## 2018-07-15 LAB — HIV-1 RNA QUANT-NO REFLEX-BLD
HIV 1 RNA Quant: 22200 copies/mL — ABNORMAL HIGH
HIV-1 RNA Quant, Log: 4.35 Log copies/mL — ABNORMAL HIGH

## 2018-07-15 LAB — HEPATITIS C ANTIBODY
Hepatitis C Ab: NONREACTIVE
SIGNAL TO CUT-OFF: 0.12 (ref <1.00)

## 2018-07-15 LAB — HEPATITIS B DNA, ULTRAQUANTITATIVE, PCR
HEPATITIS B DNA (CALC): 5.1 {Log_IU}/mL — AB
HEPATITIS B DNA: 126000 [IU]/mL — AB

## 2018-07-15 LAB — RPR: RPR Ser Ql: NONREACTIVE

## 2018-07-15 NOTE — Progress Notes (Signed)
Results communicated and interpreted through MyChart. He is to pick up Bactrim for OI prophylaxis. HB up - will resume TAF through re-initiating [facility]. .Marland Kitchen

## 2018-07-18 DIAGNOSIS — W3400XA Accidental discharge from unspecified firearms or gun, initial encounter: Secondary | ICD-10-CM

## 2018-07-18 HISTORY — DX: Accidental discharge from unspecified firearms or gun, initial encounter: W34.00XA

## 2018-07-21 ENCOUNTER — Encounter (HOSPITAL_COMMUNITY): Payer: Self-pay | Admitting: Emergency Medicine

## 2018-07-21 ENCOUNTER — Emergency Department (HOSPITAL_COMMUNITY): Payer: Medicaid Other | Admitting: Anesthesiology

## 2018-07-21 ENCOUNTER — Other Ambulatory Visit: Payer: Self-pay

## 2018-07-21 ENCOUNTER — Inpatient Hospital Stay (HOSPITAL_COMMUNITY)
Admission: EM | Admit: 2018-07-21 | Discharge: 2018-07-30 | DRG: 240 | Disposition: A | Discharge status: Home Health | Payer: Medicaid Other | Attending: Internal Medicine | Admitting: Internal Medicine

## 2018-07-21 ENCOUNTER — Emergency Department (HOSPITAL_COMMUNITY): Payer: Medicaid Other

## 2018-07-21 ENCOUNTER — Encounter (HOSPITAL_COMMUNITY)
Admission: EM | Disposition: A | Discharge status: Home Health | Payer: Self-pay | Source: Home / Self Care | Attending: Internal Medicine

## 2018-07-21 DIAGNOSIS — Z23 Encounter for immunization: Secondary | ICD-10-CM | POA: Diagnosis not present

## 2018-07-21 DIAGNOSIS — I96 Gangrene, not elsewhere classified: Principal | ICD-10-CM | POA: Diagnosis present

## 2018-07-21 DIAGNOSIS — B871 Wound myiasis: Secondary | ICD-10-CM | POA: Diagnosis present

## 2018-07-21 DIAGNOSIS — B181 Chronic viral hepatitis B without delta-agent: Secondary | ICD-10-CM | POA: Diagnosis present

## 2018-07-21 DIAGNOSIS — F17210 Nicotine dependence, cigarettes, uncomplicated: Secondary | ICD-10-CM | POA: Diagnosis present

## 2018-07-21 DIAGNOSIS — F329 Major depressive disorder, single episode, unspecified: Secondary | ICD-10-CM | POA: Diagnosis present

## 2018-07-21 DIAGNOSIS — B20 Human immunodeficiency virus [HIV] disease: Secondary | ICD-10-CM | POA: Diagnosis present

## 2018-07-21 DIAGNOSIS — B879 Myiasis, unspecified: Secondary | ICD-10-CM | POA: Diagnosis present

## 2018-07-21 DIAGNOSIS — Z89021 Acquired absence of right finger(s): Secondary | ICD-10-CM | POA: Diagnosis not present

## 2018-07-21 DIAGNOSIS — Z79899 Other long term (current) drug therapy: Secondary | ICD-10-CM | POA: Diagnosis not present

## 2018-07-21 DIAGNOSIS — S92911D Unspecified fracture of right toe(s), subsequent encounter for fracture with routine healing: Secondary | ICD-10-CM | POA: Diagnosis not present

## 2018-07-21 DIAGNOSIS — Z89431 Acquired absence of right foot: Secondary | ICD-10-CM

## 2018-07-21 DIAGNOSIS — M869 Osteomyelitis, unspecified: Secondary | ICD-10-CM | POA: Diagnosis present

## 2018-07-21 DIAGNOSIS — F1290 Cannabis use, unspecified, uncomplicated: Secondary | ICD-10-CM | POA: Diagnosis present

## 2018-07-21 DIAGNOSIS — L089 Local infection of the skin and subcutaneous tissue, unspecified: Secondary | ICD-10-CM | POA: Diagnosis present

## 2018-07-21 DIAGNOSIS — L97519 Non-pressure chronic ulcer of other part of right foot with unspecified severity: Secondary | ICD-10-CM | POA: Diagnosis present

## 2018-07-21 DIAGNOSIS — J439 Emphysema, unspecified: Secondary | ICD-10-CM | POA: Diagnosis present

## 2018-07-21 DIAGNOSIS — D62 Acute posthemorrhagic anemia: Secondary | ICD-10-CM | POA: Diagnosis not present

## 2018-07-21 DIAGNOSIS — F1910 Other psychoactive substance abuse, uncomplicated: Secondary | ICD-10-CM | POA: Diagnosis present

## 2018-07-21 DIAGNOSIS — M109 Gout, unspecified: Secondary | ICD-10-CM | POA: Diagnosis present

## 2018-07-21 DIAGNOSIS — N1330 Unspecified hydronephrosis: Secondary | ICD-10-CM | POA: Diagnosis present

## 2018-07-21 HISTORY — DX: Accidental discharge from unspecified firearms or gun, initial encounter: W34.00XA

## 2018-07-21 HISTORY — DX: Personal history of urinary calculi: Z87.442

## 2018-07-21 HISTORY — DX: Schizophrenia, unspecified: F20.9

## 2018-07-21 HISTORY — DX: Unspecified osteoarthritis, unspecified site: M19.90

## 2018-07-21 HISTORY — PX: AMPUTATION: SHX166

## 2018-07-21 LAB — BASIC METABOLIC PANEL
Anion gap: 5 (ref 5–15)
BUN: 9 mg/dL (ref 6–20)
CHLORIDE: 107 mmol/L (ref 98–111)
CO2: 27 mmol/L (ref 22–32)
CREATININE: 1.1 mg/dL (ref 0.61–1.24)
Calcium: 8.8 mg/dL — ABNORMAL LOW (ref 8.9–10.3)
GFR calc non Af Amer: >60 mL/min (ref >60)
GLUCOSE: 99 mg/dL (ref 70–99)
Potassium: 3.8 mmol/L (ref 3.5–5.1)
Sodium: 139 mmol/L (ref 135–145)

## 2018-07-21 LAB — CBC WITH DIFFERENTIAL/PLATELET
ABS IMMATURE GRANULOCYTES: 0 10*3/uL (ref 0.0–0.1)
Basophils Absolute: 0 10*3/uL (ref 0.0–0.1)
Basophils Relative: 1 %
EOS ABS: 0.1 10*3/uL (ref 0.0–0.7)
Eosinophils Relative: 2 %
HEMATOCRIT: 36.2 % — AB (ref 39.0–52.0)
Hemoglobin: 11.9 g/dL — ABNORMAL LOW (ref 13.0–17.0)
IMMATURE GRANULOCYTES: 0 %
LYMPHS ABS: 1.7 10*3/uL (ref 0.7–4.0)
Lymphocytes Relative: 41 %
MCH: 32.6 pg (ref 26.0–34.0)
MCHC: 32.9 g/dL (ref 30.0–36.0)
MCV: 99.2 fL (ref 78.0–100.0)
MONOS PCT: 9 %
Monocytes Absolute: 0.4 10*3/uL (ref 0.1–1.0)
NEUTROS ABS: 1.9 10*3/uL (ref 1.7–7.7)
NEUTROS PCT: 47 %
Platelets: 120 10*3/uL — ABNORMAL LOW (ref 150–400)
RBC: 3.65 MIL/uL — ABNORMAL LOW (ref 4.22–5.81)
RDW: 12.1 % (ref 11.5–15.5)
WBC: 4.1 10*3/uL (ref 4.0–10.5)

## 2018-07-21 LAB — I-STAT CG4 LACTIC ACID, ED
LACTIC ACID, VENOUS: 0.86 mmol/L (ref 0.5–1.9)
Lactic Acid, Venous: 1.14 mmol/L (ref 0.5–1.9)

## 2018-07-21 SURGERY — AMPUTATION, FOOT, PARTIAL
Anesthesia: General | Site: Foot | Laterality: Right

## 2018-07-21 MED ORDER — VANCOMYCIN HCL 10 G IV SOLR
1500.0000 mg | Freq: Once | INTRAVENOUS | Status: AC
  Administered 2018-07-21: 1500 mg via INTRAVENOUS
  Filled 2018-07-21: qty 1500

## 2018-07-21 MED ORDER — 0.9 % SODIUM CHLORIDE (POUR BTL) OPTIME
TOPICAL | Status: DC | PRN
  Administered 2018-07-21: 1000 mL

## 2018-07-21 MED ORDER — HYDROMORPHONE HCL 1 MG/ML IJ SOLN
1.0000 mg | Freq: Once | INTRAMUSCULAR | Status: AC
  Administered 2018-07-21: 1 mg via INTRAVENOUS
  Filled 2018-07-21: qty 1

## 2018-07-21 MED ORDER — MORPHINE SULFATE (PF) 4 MG/ML IV SOLN
4.0000 mg | Freq: Once | INTRAVENOUS | Status: AC
  Administered 2018-07-21: 4 mg via INTRAVENOUS
  Filled 2018-07-21: qty 1

## 2018-07-21 MED ORDER — LACTATED RINGERS IV SOLN
INTRAVENOUS | Status: DC | PRN
  Administered 2018-07-21 – 2018-07-22 (×2): via INTRAVENOUS

## 2018-07-21 MED ORDER — CLINDAMYCIN PHOSPHATE 600 MG/50ML IV SOLN
600.0000 mg | Freq: Once | INTRAVENOUS | Status: AC
  Administered 2018-07-21: 600 mg via INTRAVENOUS
  Filled 2018-07-21: qty 50

## 2018-07-21 MED ORDER — SODIUM CHLORIDE 0.9 % IV SOLN
INTRAVENOUS | Status: DC | PRN
  Administered 2018-07-21 – 2018-07-28 (×3): via INTRAVENOUS

## 2018-07-21 MED ORDER — SUFENTANIL CITRATE 50 MCG/ML IV SOLN
INTRAVENOUS | Status: AC
  Filled 2018-07-21: qty 1

## 2018-07-21 MED ORDER — MIDAZOLAM HCL 2 MG/2ML IJ SOLN
INTRAMUSCULAR | Status: AC
  Filled 2018-07-21: qty 2

## 2018-07-21 MED ORDER — PROPOFOL 10 MG/ML IV BOLUS
INTRAVENOUS | Status: AC
  Filled 2018-07-21: qty 20

## 2018-07-21 MED ORDER — SUFENTANIL CITRATE 50 MCG/ML IV SOLN
INTRAVENOUS | Status: DC | PRN
  Administered 2018-07-21: 10 ug via INTRAVENOUS
  Administered 2018-07-22: 5 ug via INTRAVENOUS

## 2018-07-21 MED ORDER — PIPERACILLIN-TAZOBACTAM 3.375 G IVPB 30 MIN
3.3750 g | Freq: Once | INTRAVENOUS | Status: AC
  Administered 2018-07-21: 3.375 g via INTRAVENOUS
  Filled 2018-07-21: qty 50

## 2018-07-21 MED ORDER — MIDAZOLAM HCL 5 MG/5ML IJ SOLN
INTRAMUSCULAR | Status: DC | PRN
  Administered 2018-07-21: 2 mg via INTRAVENOUS

## 2018-07-21 MED ORDER — PIPERACILLIN-TAZOBACTAM 3.375 G IVPB
3.3750 g | Freq: Three times a day (TID) | INTRAVENOUS | Status: DC
  Administered 2018-07-22 – 2018-07-30 (×24): 3.375 g via INTRAVENOUS
  Filled 2018-07-21 (×26): qty 50

## 2018-07-21 MED ORDER — PROPOFOL 10 MG/ML IV BOLUS
INTRAVENOUS | Status: DC | PRN
  Administered 2018-07-21: 200 mg via INTRAVENOUS
  Administered 2018-07-22: 50 mg via INTRAVENOUS

## 2018-07-21 MED ORDER — VANCOMYCIN HCL IN DEXTROSE 750-5 MG/150ML-% IV SOLN
750.0000 mg | Freq: Two times a day (BID) | INTRAVENOUS | Status: DC
  Administered 2018-07-22 – 2018-07-30 (×16): 750 mg via INTRAVENOUS
  Filled 2018-07-21 (×17): qty 150

## 2018-07-21 MED ORDER — PHENYLEPHRINE HCL 10 MG/ML IJ SOLN
INTRAMUSCULAR | Status: DC | PRN
  Administered 2018-07-21 – 2018-07-22 (×2): 80 ug via INTRAVENOUS

## 2018-07-21 MED ORDER — GLYCOPYRROLATE 0.2 MG/ML IJ SOLN
INTRAMUSCULAR | Status: DC | PRN
  Administered 2018-07-21: 0.2 mg via INTRAVENOUS

## 2018-07-21 MED ORDER — LIDOCAINE HCL (CARDIAC) PF 100 MG/5ML IV SOSY
PREFILLED_SYRINGE | INTRAVENOUS | Status: DC | PRN
  Administered 2018-07-21: 100 mg via INTRAVENOUS

## 2018-07-21 MED ORDER — OXYCODONE-ACETAMINOPHEN 5-325 MG PO TABS
1.0000 | ORAL_TABLET | Freq: Once | ORAL | Status: AC
  Administered 2018-07-21: 1 via ORAL
  Filled 2018-07-21: qty 1

## 2018-07-21 SURGICAL SUPPLY — 46 items
BANDAGE ESMARK 6X9 LF (GAUZE/BANDAGES/DRESSINGS) IMPLANT
BNDG CMPR 9X6 STRL LF SNTH (GAUZE/BANDAGES/DRESSINGS)
BNDG COHESIVE 4X5 TAN STRL (GAUZE/BANDAGES/DRESSINGS) ×3 IMPLANT
BNDG COHESIVE 6X5 TAN STRL LF (GAUZE/BANDAGES/DRESSINGS) ×3 IMPLANT
BNDG ESMARK 6X9 LF (GAUZE/BANDAGES/DRESSINGS)
CANISTER WOUND CARE 500ML ATS (WOUND CARE) ×2 IMPLANT
DRAPE C-ARMOR (DRAPES) ×3 IMPLANT
DRAPE U-SHAPE 47X51 STRL (DRAPES) ×6 IMPLANT
DRSG ADAPTIC 3X8 NADH LF (GAUZE/BANDAGES/DRESSINGS) ×3 IMPLANT
DRSG VAC ATS MED SENSATRAC (GAUZE/BANDAGES/DRESSINGS) ×2 IMPLANT
DURAPREP 26ML APPLICATOR (WOUND CARE) ×3 IMPLANT
ELECT REM PT RETURN 9FT ADLT (ELECTROSURGICAL) ×3
ELECTRODE REM PT RTRN 9FT ADLT (ELECTROSURGICAL) ×1 IMPLANT
GAUZE SPONGE 4X4 12PLY STRL (GAUZE/BANDAGES/DRESSINGS) ×3 IMPLANT
GLOVE BIO SURGEON STRL SZ7.5 (GLOVE) ×3 IMPLANT
GLOVE BIO SURGEON STRL SZ8 (GLOVE) ×3 IMPLANT
GLOVE BIOGEL PI IND STRL 7.5 (GLOVE) ×1 IMPLANT
GLOVE BIOGEL PI IND STRL 8 (GLOVE) ×1 IMPLANT
GLOVE BIOGEL PI INDICATOR 7.5 (GLOVE) ×2
GLOVE BIOGEL PI INDICATOR 8 (GLOVE) ×2
GOWN STRL REUS W/ TWL LRG LVL3 (GOWN DISPOSABLE) ×2 IMPLANT
GOWN STRL REUS W/ TWL XL LVL3 (GOWN DISPOSABLE) ×1 IMPLANT
GOWN STRL REUS W/TWL 2XL LVL3 (GOWN DISPOSABLE) ×3 IMPLANT
GOWN STRL REUS W/TWL LRG LVL3 (GOWN DISPOSABLE) ×6
GOWN STRL REUS W/TWL XL LVL3 (GOWN DISPOSABLE) ×3
KIT BASIN OR (CUSTOM PROCEDURE TRAY) ×3 IMPLANT
KIT TURNOVER KIT B (KITS) ×3 IMPLANT
MANIFOLD NEPTUNE II (INSTRUMENTS) ×3 IMPLANT
NS IRRIG 1000ML POUR BTL (IV SOLUTION) ×3 IMPLANT
PACK ORTHO EXTREMITY (CUSTOM PROCEDURE TRAY) ×3 IMPLANT
PAD ARMBOARD 7.5X6 YLW CONV (MISCELLANEOUS) ×6 IMPLANT
PAD CAST 4YDX4 CTTN HI CHSV (CAST SUPPLIES) ×1 IMPLANT
PADDING CAST COTTON 4X4 STRL (CAST SUPPLIES) ×3
SPONGE LAP 18X18 X RAY DECT (DISPOSABLE) ×3 IMPLANT
STAPLER VISISTAT 35W (STAPLE) ×3 IMPLANT
STOCKINETTE IMPERVIOUS LG (DRAPES) IMPLANT
SUCTION FRAZIER HANDLE 10FR (MISCELLANEOUS) ×2
SUCTION TUBE FRAZIER 10FR DISP (MISCELLANEOUS) ×1 IMPLANT
SUT ETHILON 2 0 PSLX (SUTURE) ×6 IMPLANT
SUT PDS AB 2-0 CT1 27 (SUTURE) IMPLANT
TOWEL OR 17X24 6PK STRL BLUE (TOWEL DISPOSABLE) ×3 IMPLANT
TOWEL OR 17X26 10 PK STRL BLUE (TOWEL DISPOSABLE) ×3 IMPLANT
TUBE CONNECTING 12'X1/4 (SUCTIONS) ×1
TUBE CONNECTING 12X1/4 (SUCTIONS) ×2 IMPLANT
UNDERPAD 30X30 (UNDERPADS AND DIAPERS) ×3 IMPLANT
WATER STERILE IRR 1000ML POUR (IV SOLUTION) ×3 IMPLANT

## 2018-07-21 NOTE — Anesthesia Preprocedure Evaluation (Addendum)
Anesthesia Evaluation  Patient identified by MRN, date of birth, ID band Patient awake    Reviewed: Allergy & Precautions, NPO status , Patient's Chart, lab work & pertinent test results  History of Anesthesia Complications Negative for: history of anesthetic complications  Airway Mallampati: I  TM Distance: >3 FB Neck ROM: Full    Dental  (+) Dental Advisory Given, Teeth Intact   Pulmonary COPD, Current Smoker,    breath sounds clear to auscultation       Cardiovascular negative cardio ROS   Rhythm:Regular Rate:Normal     Neuro/Psych Depression  Neuromuscular disease (Sciatica)    GI/Hepatic negative GI ROS, (+)       marijuana use, Hepatitis -, B  Endo/Other  negative endocrine ROS  Renal/GU negative Renal ROS  negative genitourinary   Musculoskeletal  Gout    Abdominal   Peds  Hematology  (+) anemia , HIV,  Thrombocytopenia    Anesthesia Other Findings   Reproductive/Obstetrics                            Anesthesia Physical Anesthesia Plan  ASA: III and emergent  Anesthesia Plan: General   Post-op Pain Management:    Induction: Intravenous  PONV Risk Score and Plan: 3 and Treatment may vary due to age or medical condition, Ondansetron and Midazolam  Airway Management Planned: LMA  Additional Equipment: None  Intra-op Plan:   Post-operative Plan: Extubation in OR  Informed Consent: I have reviewed the patients History and Physical, chart, labs and discussed the procedure including the risks, benefits and alternatives for the proposed anesthesia with the patient or authorized representative who has indicated his/her understanding and acceptance.   Dental advisory given  Plan Discussed with: CRNA and Anesthesiologist  Anesthesia Plan Comments:        Anesthesia Quick Evaluation

## 2018-07-21 NOTE — ED Notes (Signed)
This RN went in to assess patient's foot.  Covering was wet, patient's foot macerated on the bottom.  Patient arched toes and this RN noted 50+ small maggots crawling around in patient's foot.  Patient endorses pain on the top and sides of hit foot as well, halfway to his heal.

## 2018-07-21 NOTE — ED Notes (Signed)
Patient returned from X-ray 
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
## 2018-07-21 NOTE — ED Notes (Signed)
Patient transported to X-ray 
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
## 2018-07-22 ENCOUNTER — Other Ambulatory Visit: Payer: Self-pay

## 2018-07-22 ENCOUNTER — Encounter (HOSPITAL_COMMUNITY): Payer: Self-pay | Admitting: Family Medicine

## 2018-07-22 DIAGNOSIS — B879 Myiasis, unspecified: Secondary | ICD-10-CM

## 2018-07-22 DIAGNOSIS — F1910 Other psychoactive substance abuse, uncomplicated: Secondary | ICD-10-CM | POA: Diagnosis present

## 2018-07-22 HISTORY — PX: TOE AMPUTATION: SHX809

## 2018-07-22 LAB — CBC WITH DIFFERENTIAL/PLATELET
ABS IMMATURE GRANULOCYTES: 0 10*3/uL (ref 0.0–0.1)
BASOS PCT: 1 %
Basophils Absolute: 0 10*3/uL (ref 0.0–0.1)
EOS PCT: 1 %
Eosinophils Absolute: 0.1 10*3/uL (ref 0.0–0.7)
HCT: 40.3 % (ref 39.0–52.0)
Hemoglobin: 13.4 g/dL (ref 13.0–17.0)
Immature Granulocytes: 1 %
Lymphocytes Relative: 37 %
Lymphs Abs: 1.5 10*3/uL (ref 0.7–4.0)
MCH: 32.8 pg (ref 26.0–34.0)
MCHC: 33.3 g/dL (ref 30.0–36.0)
MCV: 98.8 fL (ref 78.0–100.0)
MONO ABS: 0.3 10*3/uL (ref 0.1–1.0)
Monocytes Relative: 7 %
NEUTROS ABS: 2.1 10*3/uL (ref 1.7–7.7)
Neutrophils Relative %: 53 %
PLATELETS: 97 10*3/uL — AB (ref 150–400)
RBC: 4.08 MIL/uL — ABNORMAL LOW (ref 4.22–5.81)
RDW: 12.1 % (ref 11.5–15.5)
WBC: 3.9 10*3/uL — ABNORMAL LOW (ref 4.0–10.5)

## 2018-07-22 LAB — BASIC METABOLIC PANEL
ANION GAP: 9 (ref 5–15)
BUN: 7 mg/dL (ref 6–20)
CO2: 26 mmol/L (ref 22–32)
Calcium: 8.4 mg/dL — ABNORMAL LOW (ref 8.9–10.3)
Chloride: 104 mmol/L (ref 98–111)
Creatinine, Ser: 1.3 mg/dL — ABNORMAL HIGH (ref 0.61–1.24)
GFR calc Af Amer: >60 mL/min (ref >60)
GLUCOSE: 112 mg/dL — AB (ref 70–99)
Potassium: 3.7 mmol/L (ref 3.5–5.1)
SODIUM: 139 mmol/L (ref 135–145)

## 2018-07-22 LAB — RAPID URINE DRUG SCREEN, HOSP PERFORMED
AMPHETAMINES: NOT DETECTED
BENZODIAZEPINES: POSITIVE — AB
Barbiturates: NOT DETECTED
Cocaine: POSITIVE — AB
Opiates: POSITIVE — AB
TETRAHYDROCANNABINOL: POSITIVE — AB

## 2018-07-22 MED ORDER — LABETALOL HCL 5 MG/ML IV SOLN: 10.0000 mg | Freq: Once | INTRAVENOUS | Status: AC

## 2018-07-22 MED ORDER — PROMETHAZINE HCL 25 MG/ML IJ SOLN: 6.2500 mg | INTRAMUSCULAR | Status: DC | PRN

## 2018-07-22 MED ORDER — HYDROCODONE-ACETAMINOPHEN 5-325 MG PO TABS
1.0000 | ORAL_TABLET | ORAL | Status: DC | PRN
  Administered 2018-07-22: 1 via ORAL
  Administered 2018-07-22: 2 via ORAL
  Administered 2018-07-22: 1 via ORAL
  Administered 2018-07-23 – 2018-07-25 (×8): 2 via ORAL
  Administered 2018-07-25: 1 via ORAL
  Administered 2018-07-25: 2 via ORAL
  Administered 2018-07-26 – 2018-07-28 (×3): 1 via ORAL
  Administered 2018-07-29 – 2018-07-30 (×4): 2 via ORAL
  Filled 2018-07-22 (×7): qty 2
  Filled 2018-07-22 (×2): qty 1
  Filled 2018-07-22: qty 2
  Filled 2018-07-22: qty 1
  Filled 2018-07-22 (×4): qty 2
  Filled 2018-07-22: qty 1
  Filled 2018-07-22: qty 2
  Filled 2018-07-22: qty 1
  Filled 2018-07-22: qty 2
  Filled 2018-07-22: qty 1
  Filled 2018-07-22 (×2): qty 2

## 2018-07-22 MED ORDER — LABETALOL HCL 5 MG/ML IV SOLN
10.0000 mg | INTRAVENOUS | Status: DC | PRN
  Administered 2018-07-22: 10 mg via INTRAVENOUS

## 2018-07-22 MED ORDER — SENNOSIDES-DOCUSATE SODIUM 8.6-50 MG PO TABS: 1.0000 | ORAL_TABLET | Freq: Every evening | ORAL | Status: DC | PRN

## 2018-07-22 MED ORDER — MORPHINE SULFATE (PF) 4 MG/ML IV SOLN
4.0000 mg | INTRAVENOUS | Status: AC | PRN
  Administered 2018-07-22 – 2018-07-23 (×3): 4 mg via INTRAVENOUS
  Filled 2018-07-22 (×4): qty 1

## 2018-07-22 MED ORDER — ACETAMINOPHEN 650 MG RE SUPP: 650.0000 mg | Freq: Four times a day (QID) | RECTAL | Status: DC | PRN

## 2018-07-22 MED ORDER — LIDOCAINE 2% (20 MG/ML) 5 ML SYRINGE
INTRAMUSCULAR | Status: AC
  Filled 2018-07-22: qty 5

## 2018-07-22 MED ORDER — ACETAMINOPHEN 325 MG PO TABS
650.0000 mg | ORAL_TABLET | Freq: Four times a day (QID) | ORAL | Status: DC | PRN
  Administered 2018-07-22: 650 mg via ORAL
  Filled 2018-07-22: qty 2

## 2018-07-22 MED ORDER — MOMETASONE FURO-FORMOTEROL FUM 100-5 MCG/ACT IN AERO
2.0000 | INHALATION_SPRAY | Freq: Two times a day (BID) | RESPIRATORY_TRACT | Status: DC
  Administered 2018-07-22 – 2018-07-30 (×15): 2 via RESPIRATORY_TRACT
  Filled 2018-07-22 (×2): qty 8.8

## 2018-07-22 MED ORDER — INSULIN ASPART 100 UNIT/ML ~~LOC~~ SOLN
SUBCUTANEOUS | Status: AC
  Filled 2018-07-22: qty 1

## 2018-07-22 MED ORDER — SERTRALINE HCL 50 MG PO TABS
50.0000 mg | ORAL_TABLET | Freq: Every day | ORAL | Status: DC
  Administered 2018-07-22 – 2018-07-30 (×8): 50 mg via ORAL
  Filled 2018-07-22 (×8): qty 1

## 2018-07-22 MED ORDER — DARUN-COBIC-EMTRICIT-TENOFAF 800-150-200-10 MG PO TABS
1.0000 | ORAL_TABLET | Freq: Every day | ORAL | Status: DC
  Administered 2018-07-23 – 2018-07-30 (×7): 1 via ORAL
  Filled 2018-07-22 (×9): qty 1

## 2018-07-22 MED ORDER — SULFAMETHOXAZOLE-TRIMETHOPRIM 800-160 MG PO TABS
1.0000 | ORAL_TABLET | Freq: Every day | ORAL | Status: DC
  Administered 2018-07-22 – 2018-07-30 (×8): 1 via ORAL
  Filled 2018-07-22 (×8): qty 1

## 2018-07-22 MED ORDER — OXYCODONE HCL 5 MG PO TABS: 5.0000 mg | ORAL_TABLET | Freq: Once | ORAL | Status: DC | PRN

## 2018-07-22 MED ORDER — ENSURE ENLIVE PO LIQD
237.0000 mL | Freq: Two times a day (BID) | ORAL | Status: DC
  Administered 2018-07-24 – 2018-07-28 (×9): 237 mL via ORAL

## 2018-07-22 MED ORDER — LABETALOL HCL 5 MG/ML IV SOLN
INTRAVENOUS | Status: AC
  Filled 2018-07-22: qty 4

## 2018-07-22 MED ORDER — INFLUENZA VAC SPLIT QUAD 0.5 ML IM SUSY
0.5000 mL | PREFILLED_SYRINGE | INTRAMUSCULAR | Status: AC
  Administered 2018-07-25: 0.5 mL via INTRAMUSCULAR
  Filled 2018-07-22: qty 0.5

## 2018-07-22 MED ORDER — ALBUTEROL SULFATE (2.5 MG/3ML) 0.083% IN NEBU: 2.5000 mg | INHALATION_SOLUTION | Freq: Four times a day (QID) | RESPIRATORY_TRACT | Status: DC | PRN

## 2018-07-22 MED ORDER — ONDANSETRON HCL 4 MG/2ML IJ SOLN
INTRAMUSCULAR | Status: AC
  Filled 2018-07-22: qty 2

## 2018-07-22 MED ORDER — ONDANSETRON HCL 4 MG PO TABS: 4.0000 mg | ORAL_TABLET | Freq: Four times a day (QID) | ORAL | Status: DC | PRN

## 2018-07-22 MED ORDER — BISACODYL 5 MG PO TBEC: 5.0000 mg | DELAYED_RELEASE_TABLET | Freq: Every day | ORAL | Status: DC | PRN

## 2018-07-22 MED ORDER — FENTANYL CITRATE (PF) 100 MCG/2ML IJ SOLN: 25.0000 ug | INTRAMUSCULAR | Status: DC | PRN

## 2018-07-22 MED ORDER — OXYCODONE HCL 5 MG/5ML PO SOLN: 5.0000 mg | Freq: Once | ORAL | Status: DC | PRN

## 2018-07-22 MED ORDER — SODIUM CHLORIDE 0.9 % IJ SOLN
INTRAMUSCULAR | Status: AC
  Filled 2018-07-22: qty 10

## 2018-07-22 MED ORDER — ONDANSETRON HCL 4 MG/2ML IJ SOLN
INTRAMUSCULAR | Status: DC | PRN
  Administered 2018-07-22: 4 mg via INTRAVENOUS

## 2018-07-22 MED ORDER — MIRTAZAPINE 15 MG PO TABS
15.0000 mg | ORAL_TABLET | Freq: Every day | ORAL | Status: DC
  Administered 2018-07-22 – 2018-07-29 (×8): 15 mg via ORAL
  Filled 2018-07-22 (×8): qty 1

## 2018-07-22 MED ORDER — HYDROXYZINE HCL 25 MG PO TABS: 25.0000 mg | ORAL_TABLET | Freq: Three times a day (TID) | ORAL | Status: DC | PRN

## 2018-07-22 MED ORDER — SODIUM CHLORIDE 0.9 % IV SOLN
INTRAVENOUS | Status: AC
  Administered 2018-07-22: 02:00:00 via INTRAVENOUS

## 2018-07-22 MED ORDER — ONDANSETRON HCL 4 MG/2ML IJ SOLN: 4.0000 mg | Freq: Four times a day (QID) | INTRAMUSCULAR | Status: DC | PRN

## 2018-07-22 MED ORDER — OLANZAPINE 5 MG PO TABS
10.0000 mg | ORAL_TABLET | Freq: Every day | ORAL | Status: DC
  Administered 2018-07-22 – 2018-07-29 (×8): 10 mg via ORAL
  Filled 2018-07-22 (×4): qty 2
  Filled 2018-07-22: qty 1
  Filled 2018-07-22 (×4): qty 2

## 2018-07-22 MED ORDER — PHENYLEPHRINE 40 MCG/ML (10ML) SYRINGE FOR IV PUSH (FOR BLOOD PRESSURE SUPPORT)
PREFILLED_SYRINGE | INTRAVENOUS | Status: AC
  Filled 2018-07-22: qty 10

## 2018-07-22 NOTE — Anesthesia Procedure Notes (Signed)
Procedure Name: LMA Insertion Date/Time: 07/21/2018 11:31 PM Performed by: Melina Schools, CRNA Pre-anesthesia Checklist: Patient identified, Emergency Drugs available, Suction available, Patient being monitored and Timeout performed Patient Re-evaluated:Patient Re-evaluated prior to induction Oxygen Delivery Method: Circle system utilized Preoxygenation: Pre-oxygenation with 100% oxygen Induction Type: IV induction Ventilation: Mask ventilation without difficulty LMA: LMA inserted LMA Size: 5.0 Placement Confirmation: positive ETCO2 and breath sounds checked- equal and bilateral Tube secured with: Tape Dental Injury: Teeth and Oropharynx as per pre-operative assessment
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
## 2018-07-22 NOTE — Progress Notes (Signed)
Sent urine specimen for drug screen to Lab
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
## 2018-07-22 NOTE — Transfer of Care (Signed)
Immediate Anesthesia Transfer of Care Note  Patient: Curtis Hodge  Procedure(s) Performed: AMPUTATION RIGHT toes, first, second, third, fourth, and fifth, APPLICATION OF WOUND VAC (Right Foot)  Patient Location: PACU  Anesthesia Type:General  Level of Consciousness: sedated, drowsy, patient cooperative and responds to stimulation  Airway & Oxygen Therapy: Patient Spontanous Breathing and Patient connected to nasal cannula oxygen  Post-op Assessment: Report given to RN, Post -op Vital signs reviewed and stable and Patient moving all extremities X 4  Post vital signs: Reviewed and stable  Last Vitals:  Vitals Value Taken Time  BP    Temp    Pulse 108 07/22/2018  1:20 AM  Resp 19 07/22/2018  1:20 AM  SpO2 100 % 07/22/2018  1:20 AM  Vitals shown include unvalidated device data.  Last Pain:  Vitals:   07/21/18 2150  TempSrc:   PainSc: 8          Complications: No apparent anesthesia complications

## 2018-07-22 NOTE — Progress Notes (Signed)
Orthopaedic Trauma Service (OTS)  1 Day Post-Op Procedure(s) (LRB): AMPUTATION RIGHT toes, first, second, third, fourth, and fifth, APPLICATION OF WOUND VAC (Right)  Subjective: Patient reports pain as quite manageable.  Somnolent but easily arousable and talkative.  Objective: Current Vitals Blood pressure (!) 163/86, pulse 74, temperature 98.7 F (37.1 C), temperature source Oral, resp. rate 18, SpO2 99 %. Vital signs in last 24 hours: Temp:  [97.3 F (36.3 C)-99.2 F (37.3 C)] 98.7 F (37.1 C) (09/04 0459) Pulse Rate:  [53-141] 74 (09/04 0459) Resp:  [13-20] 18 (09/04 0459) BP: (140-217)/(80-118) 163/86 (09/04 0459) SpO2:  [96 %-100 %] 99 % (09/04 0749)  Intake/Output from previous day: 09/03 0701 - 09/04 0700 In: 1641.7 [I.V.:1392.8; IV Piggyback:248.8] Out: 1195 [Urine:1175; Blood:20]  LABS Recent Labs    07/21/18 1930 07/22/18 0210  HGB 11.9* 13.4   Recent Labs    07/21/18 1930 07/22/18 0210  WBC 4.1 3.9*  RBC 3.65* 4.08*  HCT 36.2* 40.3  PLT 120* 97*   Recent Labs    07/21/18 1930 07/22/18 0210  NA 139 139  K 3.8 3.7  CL 107 104  CO2 27 26  BUN 9 7  CREATININE 1.10 1.30*  GLUCOSE 99 112*  CALCIUM 8.8* 8.4*   No results for input(s): LABPT, INR in the last 72 hours.   Physical Exam RLE Dressing intact, clean, dry; vac holding  Edema/ swelling controlled  Sens: DPN, SPN, TN intact  Motor: ankle flex and ext intact  Brisk cap refill, warm to touch  Assessment/Plan: 1 Day Post-Op Procedure(s) (LRB): AMPUTATION RIGHT toes, first, second, third, fourth, and fifth, APPLICATION OF WOUND VAC (Right) 1. PT/OT WBAT on heel 2. DVT proph Other (comment) mechanical 3. Repeat I&D tomorrow or Friday; Dr. Lajoyce Corners to close on Monday   Myrene Galas, MD [facility], [facility] [phone] (p)

## 2018-07-22 NOTE — Progress Notes (Signed)
Draw the insulin 10 units under his name, realized wrong Pt. Returned to pyxis . Insulin needed for OR pt.was given wrong name.

## 2018-07-22 NOTE — Brief Op Note (Signed)
07/22/2018  1:07 AM  PATIENT:  [name]  52 y.o. male  PRE-OPERATIVE DIAGNOSIS: Right foot gangrene, maggots  POST-OPERATIVE DIAGNOSIS:  Right foot gangrene, maggots  PROCEDURE:  Procedure(s): AMPUTATION RIGHT toes, first, second, third, fourth, and fifth   APPLICATION OF WOUND VAC (Right)  SURGEON:  Surgeon(s) and Role:    Myrene Galas, MD - Primary  PHYSICIAN ASSISTANT: Montez Morita, PA-C  ANESTHESIA:   general  EBL:  50 cc  BLOOD ADMINISTERED:none  DRAINS: wound vac   LOCAL MEDICATIONS USED:  NONE  SPECIMEN:  Source of Specimen:  5 toes to path and micro  DISPOSITION OF SPECIMEN:  path and micro  COUNTS:  YES  TOURNIQUET:  * No tourniquets in log *  DICTATION: .Other Dictation: Dictation Number [id number]  PLAN OF CARE: Admit to inpatient   PATIENT DISPOSITION:  PACU - hemodynamically stable.   Delay start of Pharmacological VTE agent (>24hrs) due to surgical blood loss or risk of bleeding: no

## 2018-07-23 ENCOUNTER — Encounter (HOSPITAL_COMMUNITY): Payer: Self-pay | Admitting: Certified Registered Nurse Anesthetist

## 2018-07-23 ENCOUNTER — Encounter (HOSPITAL_COMMUNITY)
Admission: EM | Disposition: A | Discharge status: Home Health | Payer: Self-pay | Source: Home / Self Care | Attending: Internal Medicine

## 2018-07-23 ENCOUNTER — Inpatient Hospital Stay (HOSPITAL_COMMUNITY): Payer: Medicaid Other | Admitting: Certified Registered Nurse Anesthetist

## 2018-07-23 HISTORY — PX: I & D EXTREMITY: SHX5045

## 2018-07-23 HISTORY — PX: APPLICATION OF WOUND VAC: SHX5189

## 2018-07-23 LAB — SURGICAL PCR SCREEN
MRSA, PCR: NEGATIVE
Staphylococcus aureus: NEGATIVE

## 2018-07-23 SURGERY — IRRIGATION AND DEBRIDEMENT EXTREMITY
Anesthesia: General | Site: Foot | Laterality: Right

## 2018-07-23 MED ORDER — FENTANYL CITRATE (PF) 250 MCG/5ML IJ SOLN
INTRAMUSCULAR | Status: AC
  Filled 2018-07-23: qty 5

## 2018-07-23 MED ORDER — HYDRALAZINE HCL 20 MG/ML IJ SOLN
10.0000 mg | Freq: Once | INTRAMUSCULAR | Status: AC
  Administered 2018-07-23 (×2): 10 mg via INTRAVENOUS

## 2018-07-23 MED ORDER — 0.9 % SODIUM CHLORIDE (POUR BTL) OPTIME
TOPICAL | Status: DC | PRN
  Administered 2018-07-23: 1000 mL

## 2018-07-23 MED ORDER — ONDANSETRON HCL 4 MG/2ML IJ SOLN
INTRAMUSCULAR | Status: DC | PRN
  Administered 2018-07-23: 4 mg via INTRAVENOUS

## 2018-07-23 MED ORDER — PROPOFOL 10 MG/ML IV BOLUS
INTRAVENOUS | Status: DC | PRN
  Administered 2018-07-23: 200 mg via INTRAVENOUS

## 2018-07-23 MED ORDER — LABETALOL HCL 5 MG/ML IV SOLN
INTRAVENOUS | Status: DC | PRN
  Administered 2018-07-23: 2.5 mg via INTRAVENOUS

## 2018-07-23 MED ORDER — FENTANYL CITRATE (PF) 100 MCG/2ML IJ SOLN
INTRAMUSCULAR | Status: AC
  Administered 2018-07-23: 50 ug via INTRAVENOUS
  Filled 2018-07-23: qty 2

## 2018-07-23 MED ORDER — OXYCODONE HCL 5 MG/5ML PO SOLN: 5.0000 mg | Freq: Once | ORAL | Status: DC | PRN

## 2018-07-23 MED ORDER — LACTATED RINGERS IV SOLN
INTRAVENOUS | Status: DC | PRN
  Administered 2018-07-23 (×2): via INTRAVENOUS

## 2018-07-23 MED ORDER — OXYCODONE HCL 5 MG PO TABS: 5.0000 mg | ORAL_TABLET | Freq: Once | ORAL | Status: DC | PRN

## 2018-07-23 MED ORDER — HYDRALAZINE HCL 20 MG/ML IJ SOLN
INTRAMUSCULAR | Status: AC
  Administered 2018-07-23: 10 mg via INTRAVENOUS
  Filled 2018-07-23: qty 1

## 2018-07-23 MED ORDER — MIDAZOLAM HCL 2 MG/2ML IJ SOLN
INTRAMUSCULAR | Status: AC
  Filled 2018-07-23: qty 2

## 2018-07-23 MED ORDER — LIDOCAINE 2% (20 MG/ML) 5 ML SYRINGE
INTRAMUSCULAR | Status: DC | PRN
  Administered 2018-07-23: 70 mg via INTRAVENOUS

## 2018-07-23 MED ORDER — FENTANYL CITRATE (PF) 100 MCG/2ML IJ SOLN
25.0000 ug | INTRAMUSCULAR | Status: DC | PRN
  Administered 2018-07-23: 50 ug via INTRAVENOUS
  Administered 2018-07-23: 25 ug via INTRAVENOUS

## 2018-07-23 MED ORDER — LIDOCAINE 2% (20 MG/ML) 5 ML SYRINGE
INTRAMUSCULAR | Status: AC
  Filled 2018-07-23: qty 5

## 2018-07-23 MED ORDER — FENTANYL CITRATE (PF) 100 MCG/2ML IJ SOLN
INTRAMUSCULAR | Status: DC | PRN
  Administered 2018-07-23 (×2): 25 ug via INTRAVENOUS

## 2018-07-23 MED ORDER — ONDANSETRON HCL 4 MG/2ML IJ SOLN: 4.0000 mg | Freq: Four times a day (QID) | INTRAMUSCULAR | Status: DC | PRN

## 2018-07-23 MED ORDER — DEXAMETHASONE SODIUM PHOSPHATE 10 MG/ML IJ SOLN
INTRAMUSCULAR | Status: DC | PRN
  Administered 2018-07-23: 10 mg via INTRAVENOUS

## 2018-07-23 MED ORDER — ONDANSETRON HCL 4 MG/2ML IJ SOLN
INTRAMUSCULAR | Status: AC
  Filled 2018-07-23: qty 2

## 2018-07-23 MED ORDER — CHLORHEXIDINE GLUCONATE CLOTH 2 % EX PADS
6.0000 | MEDICATED_PAD | Freq: Once | CUTANEOUS | Status: AC
  Administered 2018-07-23: 6 via TOPICAL

## 2018-07-23 MED ORDER — MIDAZOLAM HCL 5 MG/5ML IJ SOLN
INTRAMUSCULAR | Status: DC | PRN
  Administered 2018-07-23: 2 mg via INTRAVENOUS

## 2018-07-23 MED ORDER — PROPOFOL 10 MG/ML IV BOLUS
INTRAVENOUS | Status: AC
  Filled 2018-07-23: qty 20

## 2018-07-23 SURGICAL SUPPLY — 54 items
BANDAGE ACE 4X5 VEL STRL LF (GAUZE/BANDAGES/DRESSINGS) IMPLANT
BNDG COHESIVE 4X5 TAN STRL (GAUZE/BANDAGES/DRESSINGS) ×3 IMPLANT
BNDG GAUZE ELAST 4 BULKY (GAUZE/BANDAGES/DRESSINGS) ×6 IMPLANT
BNDG GAUZE STRTCH 6 (GAUZE/BANDAGES/DRESSINGS) ×9 IMPLANT
BRUSH SCRUB SURG 4.25 DISP (MISCELLANEOUS) ×6 IMPLANT
CANISTER SUCT 3000ML PPV (MISCELLANEOUS) ×5 IMPLANT
CANISTER WOUND CARE 500ML ATS (WOUND CARE) ×3 IMPLANT
COVER SURGICAL LIGHT HANDLE (MISCELLANEOUS) ×6 IMPLANT
DRAPE HALF SHEET 40X57 (DRAPES) IMPLANT
DRAPE INCISE IOBAN 66X45 STRL (DRAPES) IMPLANT
DRAPE ORTHO SPLIT 77X108 STRL (DRAPES)
DRAPE SURG ORHT 6 SPLT 77X108 (DRAPES) IMPLANT
DRAPE U-SHAPE 47X51 STRL (DRAPES) ×3 IMPLANT
DRSG ADAPTIC 3X8 NADH LF (GAUZE/BANDAGES/DRESSINGS) ×3 IMPLANT
DRSG MEPITEL 4X7.2 (GAUZE/BANDAGES/DRESSINGS) ×2 IMPLANT
DRSG PAD ABDOMINAL 8X10 ST (GAUZE/BANDAGES/DRESSINGS) IMPLANT
DRSG VAC ATS LRG SENSATRAC (GAUZE/BANDAGES/DRESSINGS) IMPLANT
DRSG VAC ATS MED SENSATRAC (GAUZE/BANDAGES/DRESSINGS) IMPLANT
DRSG VAC ATS SM SENSATRAC (GAUZE/BANDAGES/DRESSINGS) ×2 IMPLANT
ELECT REM PT RETURN 9FT ADLT (ELECTROSURGICAL) ×3
ELECTRODE REM PT RTRN 9FT ADLT (ELECTROSURGICAL) ×1 IMPLANT
GAUZE SPONGE 4X4 12PLY STRL (GAUZE/BANDAGES/DRESSINGS) ×3 IMPLANT
GAUZE XEROFORM 5X9 LF (GAUZE/BANDAGES/DRESSINGS) ×1 IMPLANT
GLOVE BIO SURGEON STRL SZ7.5 (GLOVE) ×3 IMPLANT
GLOVE BIO SURGEON STRL SZ8 (GLOVE) ×3 IMPLANT
GLOVE BIOGEL PI IND STRL 7.5 (GLOVE) ×1 IMPLANT
GLOVE BIOGEL PI IND STRL 8 (GLOVE) ×2 IMPLANT
GLOVE BIOGEL PI INDICATOR 7.5 (GLOVE) ×2
GLOVE BIOGEL PI INDICATOR 8 (GLOVE) ×4
GOWN STRL REUS W/ TWL LRG LVL3 (GOWN DISPOSABLE) ×2 IMPLANT
GOWN STRL REUS W/ TWL XL LVL3 (GOWN DISPOSABLE) ×1 IMPLANT
GOWN STRL REUS W/TWL LRG LVL3 (GOWN DISPOSABLE) ×6
GOWN STRL REUS W/TWL XL LVL3 (GOWN DISPOSABLE) ×3
HANDPIECE INTERPULSE COAX TIP (DISPOSABLE)
KIT BASIN OR (CUSTOM PROCEDURE TRAY) ×3 IMPLANT
KIT TURNOVER KIT B (KITS) ×3 IMPLANT
MANIFOLD NEPTUNE II (INSTRUMENTS) ×1 IMPLANT
NS IRRIG 1000ML POUR BTL (IV SOLUTION) ×9 IMPLANT
PACK ORTHO EXTREMITY (CUSTOM PROCEDURE TRAY) ×3 IMPLANT
PAD ABD 8X10 STRL (GAUZE/BANDAGES/DRESSINGS) ×2 IMPLANT
PAD ARMBOARD 7.5X6 YLW CONV (MISCELLANEOUS) ×6 IMPLANT
PADDING CAST COTTON 6X4 STRL (CAST SUPPLIES) ×3 IMPLANT
SET HNDPC FAN SPRY TIP SCT (DISPOSABLE) IMPLANT
SPONGE LAP 18X18 X RAY DECT (DISPOSABLE) ×5 IMPLANT
STOCKINETTE IMPERVIOUS 9X36 MD (GAUZE/BANDAGES/DRESSINGS) ×3 IMPLANT
SUT PDS AB 2-0 CT1 27 (SUTURE) IMPLANT
SWAB CULTURE ESWAB REG 1ML (MISCELLANEOUS) IMPLANT
TOWEL OR 17X24 6PK STRL BLUE (TOWEL DISPOSABLE) ×3 IMPLANT
TOWEL OR 17X26 10 PK STRL BLUE (TOWEL DISPOSABLE) ×6 IMPLANT
TUBE CONNECTING 12'X1/4 (SUCTIONS) ×1
TUBE CONNECTING 12X1/4 (SUCTIONS) ×2 IMPLANT
UNDERPAD 30X30 (UNDERPADS AND DIAPERS) ×3 IMPLANT
WATER STERILE IRR 1000ML POUR (IV SOLUTION) ×1 IMPLANT
YANKAUER SUCT BULB TIP NO VENT (SUCTIONS) ×3 IMPLANT
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
## 2018-07-23 NOTE — Op Note (Signed)
Curtis Hodge, CHAUNCEY MEDICAL RECORD IT:[id number] ACCOUNT [id number] DATE OF BIRTH:1966-01-23 FACILITY: [facility] LOCATION: [facility] PHYSICIAN:Timathy Newberry H. Jeremaih Klima, MD  OPERATIVE REPORT  DATE OF PROCEDURE:  07/23/2018  PREOPERATIVE DIAGNOSES:  Right forefoot open amputation, status post infection and maggots.  POSTOPERATIVE DIAGNOSES:  Right forefoot open amputation, status post infection and maggots.  PROCEDURE: 1.  Partial excision of articular surfaces and subchondral bone of the metatarsal heads great toe, 2nd, 3rd, 4th, and 5th of right forefoot. 2.  Dressing change under anesthesia with wound VAC.  SURGEON:  Myrene Galas, MD  ASSISTANT:  Montez Morita, PA-C  ANESTHESIA:  General.  COMPLICATIONS:  None.  TOURNIQUET:  None.  SPECIMENS:  None.  DISPOSITION:  To PACU.  CONDITION:  Stable.  INDICATIONS FOR PROCEDURE:  The patient is a 52 year old male who underwent amputation of his toes for a paresthetic and a bacterial infection 2 days ago.  He has been receiving antibiotics and has had his wound VAC to suction.  He now presents for a  second-look debridement and dressing change under anesthesia.  I discussed the risks and benefits of the procedure including the possibility of persistent infection, need for further surgery, which is anticipated, including a formal transmetatarsal  amputation and others.  The patient acknowledged these risks and did wish to proceed.  BRIEF SUMMARY OF PROCEDURE:  He continued on his vancomycin and Zosyn.  He was taken to the OR where general anesthesia was induced.  His wound VAC dressing was removed.  Initially, there was a concern about odor; however, after chlorhexidine wash of the  skin, it appeared to be entirely related to skin and not to his wound itself.  Betadine scrub and paint was performed of the skin and then a timeout held after prep and drape.  We did not encounter any significant purulence or parasites today.  There  were small  areas of extension with firm ____, well demarcated into the cavities.  The metatarsal heads were then partially excised to denude and remove all cartilage and exposed areas of the metatarsal heads.  This was followed by further copious  irrigation.  The scalpel was used to sharply excise skin and subcutaneous tissue along the border of the wound, both plantar and dorsal, and additionally deep.  We did use a rongeur to partially excise some of the metatarsal heads.  Following more  irrigation, a wound VAC dressing was placed.  Montez Morita, PA-C, assisted throughout.  A sterile gently compressive dressing was applied.  The patient awakened from anesthesia and transported to PACU in stable condition.  PROGNOSIS:  The patient will remain on the wound VAC with broad-spectrum antibiotics.  We will obtain an MRI of the foot as continue to be concerned about the possibility of some infection extension within it as it remains slightly softer in palpation,  but he has outstanding pulses and again no frank evidence of infection extension into the foot.  My colleague, Dr. Lajoyce Corners, who has expertise in this area, plans to return to the OR next week for completion amputation.  I did discuss with him preoperatively  and intraoperatively the plan and findings.  LN/NUANCE  D:07/23/2018 T:07/23/2018 JOB:[id number]

## 2018-07-23 NOTE — Anesthesia Postprocedure Evaluation (Signed)
Anesthesia Post Note  Patient: Curtis Hodge  Procedure(s) Performed: AMPUTATION RIGHT toes, first, second, third, fourth, and fifth, APPLICATION OF WOUND VAC (Right Foot)     Patient location during evaluation: PACU Anesthesia Type: General Level of consciousness: awake and alert Pain management: pain level controlled Vital Signs Assessment: post-procedure vital signs reviewed and stable Respiratory status: spontaneous breathing, nonlabored ventilation, respiratory function stable and patient connected to nasal cannula oxygen Cardiovascular status: blood pressure returned to baseline and stable Postop Assessment: no apparent nausea or vomiting Anesthetic complications: no    Last Vitals:  Vitals:   07/22/18 2233 07/23/18 0401  BP: 138/79 137/82  Pulse: 66 61  Resp: 17 18  Temp: 37.6 C 36.9 C  SpO2: 97% 98%    Last Pain:  Vitals:   07/23/18 0401  TempSrc: Oral  PainSc:                  Beryle Lathe

## 2018-07-23 NOTE — Progress Notes (Signed)
I discussed with the patient the risks and benefits of surgery for his right foot, including the possibility of persistent infection, nerve injury, vessel injury, wound breakdown, arthritis, DVT/ PE, loss of motion, and need for further surgery among others.  He acknowledged these risks and wished to proceed.  Myrene Galas, MD [facility], [facility] [phone]

## 2018-07-23 NOTE — Anesthesia Preprocedure Evaluation (Signed)
Anesthesia Evaluation  Patient identified by MRN, date of birth, ID band Patient awake    Reviewed: Allergy & Precautions, H&P , NPO status , Patient's Chart, lab work & pertinent test results  Airway Mallampati: II   Neck ROM: full    Dental   Pulmonary COPD, Current Smoker,    breath sounds clear to auscultation       Cardiovascular negative cardio ROS   Rhythm:regular Rate:Normal     Neuro/Psych PSYCHIATRIC DISORDERS Depression Schizophrenia  Neuromuscular disease    GI/Hepatic (+) Hepatitis -  Endo/Other    Renal/GU      Musculoskeletal  (+) Arthritis ,   Abdominal   Peds  Hematology  (+) HIV,   Anesthesia Other Findings   Reproductive/Obstetrics                             Anesthesia Physical Anesthesia Plan  ASA: III  Anesthesia Plan: General   Post-op Pain Management:    Induction: Intravenous  PONV Risk Score and Plan: 1 and Ondansetron, Dexamethasone, Midazolam and Treatment may vary due to age or medical condition  Airway Management Planned: LMA  Additional Equipment:   Intra-op Plan:   Post-operative Plan:   Informed Consent: I have reviewed the patients History and Physical, chart, labs and discussed the procedure including the risks, benefits and alternatives for the proposed anesthesia with the patient or authorized representative who has indicated his/her understanding and acceptance.     Plan Discussed with: CRNA, Anesthesiologist and Surgeon  Anesthesia Plan Comments:         Anesthesia Quick Evaluation

## 2018-07-23 NOTE — Progress Notes (Signed)
Scheduled inhaler not in pt's daily med drawer in pyxis when RT was on the floor. RT will check back

## 2018-07-23 NOTE — Transfer of Care (Signed)
Immediate Anesthesia Transfer of Care Note  Patient: Curtis Hodge  Procedure(s) Performed: revision amputation right forefoot with partial excision articular surfaces metatarsal heads (Right Foot) APPLICATION OF WOUND VAC (Right Foot)  Patient Location: PACU  Anesthesia Type:General  Level of Consciousness: drowsy and patient cooperative  Airway & Oxygen Therapy: Patient Spontanous Breathing and Patient connected to face mask oxygen  Post-op Assessment: Report given to RN and Post -op Vital signs reviewed and stable  Post vital signs: Reviewed and stable  Last Vitals:  Vitals Value Taken Time  BP 194/97 07/23/2018 12:50 PM  Temp    Pulse 62 07/23/2018 12:51 PM  Resp 14 07/23/2018 12:51 PM  SpO2 100 % 07/23/2018 12:51 PM  Vitals shown include unvalidated device data.  Last Pain:  Vitals:   07/23/18 0939  TempSrc:   PainSc: Asleep         Complications: No apparent anesthesia complications

## 2018-07-23 NOTE — Anesthesia Procedure Notes (Signed)
Procedure Name: LMA Insertion Date/Time: 07/23/2018 11:39 AM Performed by: Pearson Grippe, CRNA Pre-anesthesia Checklist: Patient identified, Emergency Drugs available, Suction available and Patient being monitored Patient Re-evaluated:Patient Re-evaluated prior to induction Oxygen Delivery Method: Circle system utilized Preoxygenation: Pre-oxygenation with 100% oxygen Induction Type: IV induction Ventilation: Mask ventilation without difficulty LMA: LMA inserted LMA Size: 4.0 Number of attempts: 1 Airway Equipment and Method: Bite block Placement Confirmation: positive ETCO2 Tube secured with: Tape Dental Injury: Teeth and Oropharynx as per pre-operative assessment

## 2018-07-24 ENCOUNTER — Inpatient Hospital Stay (HOSPITAL_COMMUNITY): Payer: Medicaid Other

## 2018-07-24 ENCOUNTER — Encounter (HOSPITAL_COMMUNITY): Payer: Self-pay | Admitting: Orthopedic Surgery

## 2018-07-24 LAB — CBC
HCT: 36.8 % — ABNORMAL LOW (ref 39.0–52.0)
Hemoglobin: 12.3 g/dL — ABNORMAL LOW (ref 13.0–17.0)
MCH: 33 pg (ref 26.0–34.0)
MCHC: 33.4 g/dL (ref 30.0–36.0)
MCV: 98.7 fL (ref 78.0–100.0)
PLATELETS: 140 10*3/uL — AB (ref 150–400)
RBC: 3.73 MIL/uL — ABNORMAL LOW (ref 4.22–5.81)
RDW: 12.1 % (ref 11.5–15.5)
WBC: 7.9 10*3/uL (ref 4.0–10.5)

## 2018-07-24 LAB — BASIC METABOLIC PANEL
Anion gap: 6 (ref 5–15)
BUN: 14 mg/dL (ref 6–20)
CALCIUM: 9.1 mg/dL (ref 8.9–10.3)
CO2: 27 mmol/L (ref 22–32)
CREATININE: 1.26 mg/dL — AB (ref 0.61–1.24)
Chloride: 104 mmol/L (ref 98–111)
GFR calc Af Amer: >60 mL/min (ref >60)
GLUCOSE: 146 mg/dL — AB (ref 70–99)
Potassium: 4.8 mmol/L (ref 3.5–5.1)
SODIUM: 137 mmol/L (ref 135–145)

## 2018-07-24 MED ORDER — PRO-STAT SUGAR FREE PO LIQD
30.0000 mL | Freq: Two times a day (BID) | ORAL | Status: DC
  Administered 2018-07-24 – 2018-07-30 (×10): 30 mL via ORAL
  Filled 2018-07-24 (×11): qty 30

## 2018-07-24 MED ORDER — GADOBUTROL 1 MMOL/ML IV SOLN
7.5000 mL | Freq: Once | INTRAVENOUS | Status: AC | PRN
  Administered 2018-07-24: 6 mL via INTRAVENOUS
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
## 2018-07-24 NOTE — Progress Notes (Signed)
Orthopedic Tech Progress Note Patient Details:  Curtis Hodge [birth year] [id number]  Ortho Devices Type of Ortho Device: Darco shoe Ortho Device/Splint Location: rle Ortho Device/Splint Interventions: Application   Post Interventions Patient Tolerated: Well Instructions Provided: Care of device   [name] 07/24/2018, 3:15 PM

## 2018-07-24 NOTE — Anesthesia Postprocedure Evaluation (Signed)
Anesthesia Post Note  Patient: Curtis Hodge  Procedure(s) Performed: revision amputation right forefoot with partial excision articular surfaces metatarsal heads (Right Foot) APPLICATION OF WOUND VAC (Right Foot)     Patient location during evaluation: PACU Anesthesia Type: General Level of consciousness: awake and alert Pain management: pain level controlled Vital Signs Assessment: post-procedure vital signs reviewed and stable Respiratory status: spontaneous breathing, nonlabored ventilation, respiratory function stable and patient connected to nasal cannula oxygen Cardiovascular status: blood pressure returned to baseline and stable Postop Assessment: no apparent nausea or vomiting Anesthetic complications: no    Last Vitals:  Vitals:   07/23/18 1800 07/23/18 1957  BP: 121/87 138/84  Pulse:  90  Resp:  18  Temp:  36.5 C  SpO2:  100%    Last Pain:  Vitals:   07/24/18 1130  TempSrc:   PainSc: 4                  Roque Schill S

## 2018-07-25 LAB — BASIC METABOLIC PANEL
Anion gap: 8 (ref 5–15)
BUN: 15 mg/dL (ref 6–20)
CHLORIDE: 107 mmol/L (ref 98–111)
CO2: 25 mmol/L (ref 22–32)
Calcium: 8.7 mg/dL — ABNORMAL LOW (ref 8.9–10.3)
Creatinine, Ser: 1.2 mg/dL (ref 0.61–1.24)
Glucose, Bld: 128 mg/dL — ABNORMAL HIGH (ref 70–99)
POTASSIUM: 4.2 mmol/L (ref 3.5–5.1)
Sodium: 140 mmol/L (ref 135–145)

## 2018-07-25 MED ORDER — BACLOFEN 10 MG PO TABS
10.0000 mg | ORAL_TABLET | Freq: Once | ORAL | Status: AC
  Administered 2018-07-25: 10 mg via ORAL
  Filled 2018-07-25: qty 1

## 2018-07-25 NOTE — Plan of Care (Signed)
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
## 2018-07-26 NOTE — Plan of Care (Signed)
Problem: Health Behavior/Discharge Planning: Goal: Ability to manage health-related needs will improve Outcome: Progressing   Problem: Clinical Measurements: Goal: Will remain free from infection Outcome: Progressing Goal: Respiratory complications will improve Outcome: Progressing Goal: Cardiovascular complication will be avoided Outcome: Progressing   Problem: Activity: Goal: Risk for activity intolerance will decrease Outcome: Progressing   Problem: Pain Managment: Goal: General experience of comfort will improve Outcome: Progressing

## 2018-07-27 LAB — BASIC METABOLIC PANEL
ANION GAP: 7 (ref 5–15)
BUN: 11 mg/dL (ref 6–20)
CO2: 26 mmol/L (ref 22–32)
Calcium: 8.3 mg/dL — ABNORMAL LOW (ref 8.9–10.3)
Chloride: 106 mmol/L (ref 98–111)
Creatinine, Ser: 1.14 mg/dL (ref 0.61–1.24)
GFR calc Af Amer: >60 mL/min (ref >60)
GLUCOSE: 99 mg/dL (ref 70–99)
Potassium: 3.8 mmol/L (ref 3.5–5.1)
SODIUM: 139 mmol/L (ref 135–145)

## 2018-07-27 LAB — CBC
HCT: 32.1 % — ABNORMAL LOW (ref 39.0–52.0)
Hemoglobin: 10.6 g/dL — ABNORMAL LOW (ref 13.0–17.0)
MCH: 33.1 pg (ref 26.0–34.0)
MCHC: 33 g/dL (ref 30.0–36.0)
MCV: 100.3 fL — AB (ref 78.0–100.0)
PLATELETS: 127 10*3/uL — AB (ref 150–400)
RBC: 3.2 MIL/uL — ABNORMAL LOW (ref 4.22–5.81)
RDW: 12.4 % (ref 11.5–15.5)
WBC: 4.6 10*3/uL (ref 4.0–10.5)

## 2018-07-27 LAB — CULTURE, BLOOD (ROUTINE X 2)
Culture: NO GROWTH
Culture: NO GROWTH

## 2018-07-27 MED ORDER — ENOXAPARIN SODIUM 40 MG/0.4ML ~~LOC~~ SOLN
40.0000 mg | SUBCUTANEOUS | Status: AC
  Administered 2018-07-27: 40 mg via SUBCUTANEOUS
  Filled 2018-07-27: qty 0.4
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
## 2018-07-27 NOTE — Plan of Care (Signed)
°  Problem: Health Behavior/Discharge Planning: °Goal: Ability to manage health-related needs will improve °Outcome: Progressing °  °Problem: Clinical Measurements: °Goal: Ability to maintain clinical measurements within normal limits will improve °Outcome: Progressing °Goal: Will remain free from infection °Outcome: Progressing °Goal: Diagnostic test results will improve °Outcome: Progressing °Goal: Respiratory complications will improve °Outcome: Progressing °Goal: Cardiovascular complication will be avoided °Outcome: Progressing °  °Problem: Activity: °Goal: Risk for activity intolerance will decrease °Outcome: Progressing °  °Problem: Pain Managment: °Goal: General experience of comfort will improve °Outcome: Progressing °  °
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
## 2018-07-27 NOTE — Telephone Encounter (Signed)
Order received on 07/13/18 by [name] NP/Dr Daiva Eves to evaluate patient for [facility]).  Patient was evaluated on  for [facility]. Patient was consented to care at this time.   Frequency / Duration of [facility] visits: Effective 07/13/18: 37mo1, 79mo2, 33mo1   4 PRN's for complications with disease process/progression, medication changes or concerns   [facility] will assess for learning needs related to diagnosis and treatment regimen, provide education as needed, fill pill box if needed, address any barriers which may be preventing medication compliance, and communicating with care team including physician and case manager.   Individualized Plan Of Care with Certifcation Period from 07/13/18 to 10/11/18  a. Type of service(s) and care to be delivered: RN Case Management  b. Frequency and duration of service: Effective 07/13/18: 42mo1, 7mo2, 67mo1, 4 prns for complications with disease process/progression, medication changes or concerns . Unable to offer home visits  c. Activity restrictions: Pt may be up as tolerated and can safely ambulate without the need for a assistive device   d. Safety Measures: Standard Precautions/Infection Control   e. Service Objectives and Goals: Service Objectives are to assist the pt with HIV medication regimen adherence and staying in care with the Infectious Disease Clinic by identifying barriers to care. RN will address the barriers that are identified by the patient. Mr Briyan states his was released from jail 3weeks ago and needs to  get a phone that he can stay up to date with his appts. He also states he needs an eye appt, assistance with Social Security benefits and Medicaid along with medication management. He also states he needs to get in care with a Pulmonologist  f. Equipment required: No additional equipment needs at this time   g. Functional Limitations: Vision.    h. Rehabilitation potential: Guarded   i. Diet and  Nutritional Needs: Regular Diet   j. Medications and treatments: Medications have been reconciled and reviewed and are a part of EPIC electronic file   k. Specific therapies if needed: RN   l. Pertinent diagnoses: HIV disease,  Hx of medication NonCompliance, COPD  m. Expected outcome: Guarded
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
## 2018-07-27 NOTE — Plan of Care (Signed)
  Problem: Pain Managment: Goal: General experience of comfort will improve Outcome: Progressing   
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
## 2018-07-27 NOTE — Telephone Encounter (Signed)
I have reviewed and approve of this plan of care. 

## 2018-07-28 ENCOUNTER — Ambulatory Visit (INDEPENDENT_AMBULATORY_CARE_PROVIDER_SITE_OTHER): Payer: Self-pay | Admitting: Physician Assistant

## 2018-07-28 DIAGNOSIS — L089 Local infection of the skin and subcutaneous tissue, unspecified: Secondary | ICD-10-CM

## 2018-07-28 DIAGNOSIS — B879 Myiasis, unspecified: Secondary | ICD-10-CM

## 2018-07-28 DIAGNOSIS — B20 Human immunodeficiency virus [HIV] disease: Secondary | ICD-10-CM

## 2018-07-28 LAB — BASIC METABOLIC PANEL
Anion gap: 6 (ref 5–15)
BUN: 11 mg/dL (ref 6–20)
CO2: 26 mmol/L (ref 22–32)
Calcium: 8.4 mg/dL — ABNORMAL LOW (ref 8.9–10.3)
Chloride: 106 mmol/L (ref 98–111)
Creatinine, Ser: 1.28 mg/dL — ABNORMAL HIGH (ref 0.61–1.24)
Glucose, Bld: 97 mg/dL (ref 70–99)
Potassium: 4 mmol/L (ref 3.5–5.1)
SODIUM: 138 mmol/L (ref 135–145)

## 2018-07-28 MED ORDER — ENOXAPARIN SODIUM 40 MG/0.4ML ~~LOC~~ SOLN
40.0000 mg | SUBCUTANEOUS | Status: AC
  Administered 2018-07-28: 40 mg via SUBCUTANEOUS
  Filled 2018-07-28: qty 0.4

## 2018-07-28 MED ORDER — CEFAZOLIN SODIUM-DEXTROSE 2-4 GM/100ML-% IV SOLN
2.0000 g | INTRAVENOUS | Status: AC
  Administered 2018-07-29: 2 g via INTRAVENOUS

## 2018-07-28 MED ORDER — CHLORHEXIDINE GLUCONATE 4 % EX LIQD
60.0000 mL | Freq: Once | CUTANEOUS | Status: DC
  Administered 2018-07-29: 4 via TOPICAL
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
## 2018-07-28 NOTE — Plan of Care (Signed)
  Problem: Health Behavior/Discharge Planning: Goal: Ability to manage health-related needs will improve Outcome: Progressing   Problem: Activity: Goal: Risk for activity intolerance will decrease Outcome: Progressing   Problem: Pain Managment: Goal: General experience of comfort will improve Outcome: Progressing   

## 2018-07-28 NOTE — Plan of Care (Signed)
  Problem: Pain Managment: Goal: General experience of comfort will improve Outcome: Progressing   
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
## 2018-07-29 ENCOUNTER — Encounter (HOSPITAL_COMMUNITY)
Admission: EM | Disposition: A | Discharge status: Home Health | Payer: Self-pay | Source: Home / Self Care | Attending: Internal Medicine

## 2018-07-29 ENCOUNTER — Encounter (HOSPITAL_COMMUNITY): Payer: Self-pay | Admitting: Orthopedic Surgery

## 2018-07-29 ENCOUNTER — Inpatient Hospital Stay (HOSPITAL_COMMUNITY): Payer: Medicaid Other | Admitting: Anesthesiology

## 2018-07-29 HISTORY — PX: AMPUTATION: SHX166

## 2018-07-29 LAB — CBC
HCT: 33.8 % — ABNORMAL LOW (ref 39.0–52.0)
Hemoglobin: 11.5 g/dL — ABNORMAL LOW (ref 13.0–17.0)
MCH: 33.3 pg (ref 26.0–34.0)
MCHC: 34 g/dL (ref 30.0–36.0)
MCV: 98 fL (ref 78.0–100.0)
Platelets: 162 10*3/uL (ref 150–400)
RBC: 3.45 MIL/uL — ABNORMAL LOW (ref 4.22–5.81)
RDW: 11.9 % (ref 11.5–15.5)
WBC: 4.1 10*3/uL (ref 4.0–10.5)

## 2018-07-29 SURGERY — AMPUTATION, FOOT, PARTIAL
Anesthesia: General | Laterality: Right

## 2018-07-29 MED ORDER — ONDANSETRON HCL 4 MG/2ML IJ SOLN: 4.0000 mg | Freq: Four times a day (QID) | INTRAMUSCULAR | Status: DC | PRN

## 2018-07-29 MED ORDER — HYDROMORPHONE HCL 1 MG/ML IJ SOLN
0.2500 mg | INTRAMUSCULAR | Status: DC | PRN
  Administered 2018-07-29 (×4): 0.5 mg via INTRAVENOUS

## 2018-07-29 MED ORDER — MORPHINE SULFATE (PF) 2 MG/ML IV SOLN
1.0000 mg | Freq: Once | INTRAVENOUS | Status: AC
  Administered 2018-07-29: 1 mg via INTRAVENOUS
  Filled 2018-07-29: qty 1

## 2018-07-29 MED ORDER — MAGNESIUM CITRATE PO SOLN: 1.0000 | Freq: Once | ORAL | Status: DC | PRN

## 2018-07-29 MED ORDER — HYDROMORPHONE HCL 1 MG/ML IJ SOLN
INTRAMUSCULAR | Status: AC
  Filled 2018-07-29: qty 1

## 2018-07-29 MED ORDER — ONDANSETRON HCL 4 MG PO TABS: 4.0000 mg | ORAL_TABLET | Freq: Four times a day (QID) | ORAL | Status: DC | PRN

## 2018-07-29 MED ORDER — METHOCARBAMOL 500 MG PO TABS
500.0000 mg | ORAL_TABLET | Freq: Four times a day (QID) | ORAL | Status: DC | PRN
  Administered 2018-07-30: 500 mg via ORAL
  Filled 2018-07-29: qty 1

## 2018-07-29 MED ORDER — 0.9 % SODIUM CHLORIDE (POUR BTL) OPTIME
TOPICAL | Status: DC | PRN
  Administered 2018-07-29: 1000 mL

## 2018-07-29 MED ORDER — DOCUSATE SODIUM 100 MG PO CAPS
100.0000 mg | ORAL_CAPSULE | Freq: Two times a day (BID) | ORAL | Status: DC
  Administered 2018-07-29 – 2018-07-30 (×3): 100 mg via ORAL
  Filled 2018-07-29 (×2): qty 1

## 2018-07-29 MED ORDER — FENTANYL CITRATE (PF) 100 MCG/2ML IJ SOLN
INTRAMUSCULAR | Status: DC | PRN
  Administered 2018-07-29 (×2): 100 ug via INTRAVENOUS

## 2018-07-29 MED ORDER — PROPOFOL 10 MG/ML IV BOLUS
INTRAVENOUS | Status: DC | PRN
  Administered 2018-07-29 (×2): 50 mg via INTRAVENOUS
  Administered 2018-07-29: 200 mg via INTRAVENOUS

## 2018-07-29 MED ORDER — FENTANYL CITRATE (PF) 250 MCG/5ML IJ SOLN
INTRAMUSCULAR | Status: AC
  Filled 2018-07-29: qty 5

## 2018-07-29 MED ORDER — LACTATED RINGERS IV SOLN
INTRAVENOUS | Status: DC
  Administered 2018-07-29: 10:00:00 via INTRAVENOUS

## 2018-07-29 MED ORDER — MIDAZOLAM HCL 2 MG/2ML IJ SOLN
INTRAMUSCULAR | Status: AC
  Filled 2018-07-29: qty 2

## 2018-07-29 MED ORDER — LIDOCAINE 2% (20 MG/ML) 5 ML SYRINGE
INTRAMUSCULAR | Status: AC
  Filled 2018-07-29: qty 5

## 2018-07-29 MED ORDER — MIDAZOLAM HCL 5 MG/5ML IJ SOLN
INTRAMUSCULAR | Status: DC | PRN
  Administered 2018-07-29: 2 mg via INTRAVENOUS

## 2018-07-29 MED ORDER — SODIUM CHLORIDE 0.9 % IV SOLN
INTRAVENOUS | Status: DC
  Administered 2018-07-29: 16:00:00 via INTRAVENOUS

## 2018-07-29 MED ORDER — HYDRALAZINE HCL 20 MG/ML IJ SOLN
INTRAMUSCULAR | Status: AC
  Filled 2018-07-29: qty 1

## 2018-07-29 MED ORDER — HYDRALAZINE HCL 20 MG/ML IJ SOLN: 5.0000 mg | Freq: Once | INTRAMUSCULAR | Status: DC

## 2018-07-29 MED ORDER — BISACODYL 10 MG RE SUPP: 10.0000 mg | Freq: Every day | RECTAL | Status: DC | PRN

## 2018-07-29 MED ORDER — METHOCARBAMOL 1000 MG/10ML IJ SOLN
500.0000 mg | Freq: Four times a day (QID) | INTRAVENOUS | Status: DC | PRN
  Filled 2018-07-29: qty 5

## 2018-07-29 MED ORDER — LIDOCAINE 2% (20 MG/ML) 5 ML SYRINGE
INTRAMUSCULAR | Status: DC | PRN
  Administered 2018-07-29: 100 mg via INTRAVENOUS

## 2018-07-29 MED ORDER — PROPOFOL 10 MG/ML IV BOLUS
INTRAVENOUS | Status: AC
  Filled 2018-07-29: qty 20

## 2018-07-29 MED ORDER — METOCLOPRAMIDE HCL 5 MG/ML IJ SOLN: 5.0000 mg | Freq: Three times a day (TID) | INTRAMUSCULAR | Status: DC | PRN

## 2018-07-29 MED ORDER — HYDROCODONE-ACETAMINOPHEN 5-325 MG PO TABS
ORAL_TABLET | ORAL | Status: AC
  Filled 2018-07-29: qty 2

## 2018-07-29 MED ORDER — METOCLOPRAMIDE HCL 5 MG PO TABS: 5.0000 mg | ORAL_TABLET | Freq: Three times a day (TID) | ORAL | Status: DC | PRN

## 2018-07-29 MED ORDER — POLYETHYLENE GLYCOL 3350 17 G PO PACK: 17.0000 g | PACK | Freq: Every day | ORAL | Status: DC | PRN

## 2018-07-29 MED ORDER — HYDROMORPHONE HCL 1 MG/ML IJ SOLN: 0.2500 mg | INTRAMUSCULAR | Status: DC | PRN

## 2018-07-29 SURGICAL SUPPLY — 37 items
APL SKNCLS STERI-STRIP NONHPOA (GAUZE/BANDAGES/DRESSINGS) ×1
BENZOIN TINCTURE PRP APPL 2/3 (GAUZE/BANDAGES/DRESSINGS) ×3 IMPLANT
BLADE SAW SGTL HD 18.5X60.5X1. (BLADE) ×3 IMPLANT
BLADE SURG 21 STRL SS (BLADE) ×3 IMPLANT
BNDG COHESIVE 4X5 TAN STRL (GAUZE/BANDAGES/DRESSINGS) IMPLANT
BNDG GAUZE ELAST 4 BULKY (GAUZE/BANDAGES/DRESSINGS) IMPLANT
COVER SURGICAL LIGHT HANDLE (MISCELLANEOUS) ×5 IMPLANT
DRAPE INCISE IOBAN 66X45 STRL (DRAPES) ×3 IMPLANT
DRAPE U-SHAPE 47X51 STRL (DRAPES) ×3 IMPLANT
DRESSING PEEL AND PLC PRVNA 13 (GAUZE/BANDAGES/DRESSINGS) IMPLANT
DRSG ADAPTIC 3X8 NADH LF (GAUZE/BANDAGES/DRESSINGS) IMPLANT
DRSG PAD ABDOMINAL 8X10 ST (GAUZE/BANDAGES/DRESSINGS) IMPLANT
DRSG PEEL AND PLACE PREVENA 13 (GAUZE/BANDAGES/DRESSINGS) ×3
DURAPREP 26ML APPLICATOR (WOUND CARE) ×3 IMPLANT
ELECT REM PT RETURN 9FT ADLT (ELECTROSURGICAL) ×3
ELECTRODE REM PT RTRN 9FT ADLT (ELECTROSURGICAL) ×1 IMPLANT
GAUZE SPONGE 4X4 12PLY STRL (GAUZE/BANDAGES/DRESSINGS) IMPLANT
GLOVE BIOGEL PI IND STRL 9 (GLOVE) ×1 IMPLANT
GLOVE BIOGEL PI INDICATOR 9 (GLOVE) ×2
GLOVE SURG ORTHO 9.0 STRL STRW (GLOVE) ×3 IMPLANT
GOWN STRL REUS W/ TWL XL LVL3 (GOWN DISPOSABLE) ×3 IMPLANT
GOWN STRL REUS W/TWL XL LVL3 (GOWN DISPOSABLE) ×9
KIT BASIN OR (CUSTOM PROCEDURE TRAY) ×3 IMPLANT
KIT DRSG PREVENA PLUS 7DAY 125 (MISCELLANEOUS) ×2 IMPLANT
KIT TURNOVER KIT B (KITS) ×3 IMPLANT
NS IRRIG 1000ML POUR BTL (IV SOLUTION) ×3 IMPLANT
PACK ORTHO EXTREMITY (CUSTOM PROCEDURE TRAY) ×3 IMPLANT
PAD ARMBOARD 7.5X6 YLW CONV (MISCELLANEOUS) ×6 IMPLANT
SPONGE LAP 18X18 X RAY DECT (DISPOSABLE) IMPLANT
SUT ETHILON 2 0 PSLX (SUTURE) ×6 IMPLANT
SUT VIC AB 2-0 CTB1 (SUTURE) IMPLANT
TOWEL OR 17X24 6PK STRL BLUE (TOWEL DISPOSABLE) ×3 IMPLANT
TOWEL OR 17X26 10 PK STRL BLUE (TOWEL DISPOSABLE) ×3 IMPLANT
TUBE CONNECTING 12'X1/4 (SUCTIONS) ×1
TUBE CONNECTING 12X1/4 (SUCTIONS) ×2 IMPLANT
WATER STERILE IRR 1000ML POUR (IV SOLUTION) ×3 IMPLANT
YANKAUER SUCT BULB TIP NO VENT (SUCTIONS) ×3 IMPLANT

## 2018-07-29 NOTE — Progress Notes (Signed)
Patient transported to OR at this time.

## 2018-07-29 NOTE — Anesthesia Procedure Notes (Signed)
Procedure Name: LMA Insertion Date/Time: 07/29/2018 12:19 PM Performed by: Shireen Quan, CRNA Pre-anesthesia Checklist: Patient identified, Emergency Drugs available, Suction available and Patient being monitored Patient Re-evaluated:Patient Re-evaluated prior to induction Oxygen Delivery Method: Circle System Utilized Preoxygenation: Pre-oxygenation with 100% oxygen Induction Type: IV induction Ventilation: Mask ventilation without difficulty LMA: LMA inserted LMA Size: 4.0 Number of attempts: 1 Placement Confirmation: positive ETCO2 Tube secured with: Tape Dental Injury: Teeth and Oropharynx as per pre-operative assessment

## 2018-07-29 NOTE — Interval H&P Note (Signed)
History and Physical Interval Note:  07/29/2018 6:43 AM  [name]  has presented today for surgery, with the diagnosis of Ulceration Right Foot  The various methods of treatment have been discussed with the patient and family. After consideration of risks, benefits and other options for treatment, the patient has consented to  Procedure(s): RIGHT FOOT TRANSMETATARSAL AMPUTATION (Right) as a surgical intervention .  The patient's history has been reviewed, patient examined, no change in status, stable for surgery.  I have reviewed the patient's chart and labs.  Questions were answered to the patient's satisfaction.     Nadara Mustard

## 2018-07-29 NOTE — Anesthesia Preprocedure Evaluation (Addendum)
Anesthesia Evaluation  Patient identified by MRN, date of birth, ID band Patient awake    Reviewed: Allergy & Precautions, NPO status , Patient's Chart, lab work & pertinent test results  Airway Mallampati: I  TM Distance: >3 FB Neck ROM: Full    Dental no notable dental hx. (+) Teeth Intact   Pulmonary COPD, Current Smoker,    Pulmonary exam normal breath sounds clear to auscultation       Cardiovascular negative cardio ROS Normal cardiovascular exam Rhythm:Regular Rate:Normal     Neuro/Psych Depression Schizophrenia negative neurological ROS  negative psych ROS   GI/Hepatic negative GI ROS, Neg liver ROS, (+) B  Endo/Other  negative endocrine ROS  Renal/GU negative Renal ROS  negative genitourinary   Musculoskeletal  (+) Arthritis ,   Abdominal   Peds  Hematology negative hematology ROS (+)   Anesthesia Other Findings HIV    Reproductive/Obstetrics                            Anesthesia Physical Anesthesia Plan  ASA: III  Anesthesia Plan: General   Post-op Pain Management:    Induction: Intravenous  PONV Risk Score and Plan: 1 and Ondansetron, Dexamethasone and Midazolam  Airway Management Planned: LMA  Additional Equipment:   Intra-op Plan:   Post-operative Plan: Extubation in OR  Informed Consent: I have reviewed the patients History and Physical, chart, labs and discussed the procedure including the risks, benefits and alternatives for the proposed anesthesia with the patient or authorized representative who has indicated his/her understanding and acceptance.   Dental advisory given  Plan Discussed with: CRNA  Anesthesia Plan Comments:         Anesthesia Quick Evaluation

## 2018-07-29 NOTE — Plan of Care (Signed)
°  Problem: Health Behavior/Discharge Planning: °Goal: Ability to manage health-related needs will improve °Outcome: Progressing °  °Problem: Clinical Measurements: °Goal: Ability to maintain clinical measurements within normal limits will improve °Outcome: Progressing °Goal: Will remain free from infection °Outcome: Progressing °Goal: Diagnostic test results will improve °Outcome: Progressing °Goal: Respiratory complications will improve °Outcome: Progressing °Goal: Cardiovascular complication will be avoided °Outcome: Progressing °  °Problem: Activity: °Goal: Risk for activity intolerance will decrease °Outcome: Progressing °  °Problem: Pain Managment: °Goal: General experience of comfort will improve °Outcome: Progressing °  °

## 2018-07-29 NOTE — Transfer of Care (Signed)
Immediate Anesthesia Transfer of Care Note  Patient: Curtis Hodge  Procedure(s) Performed: RIGHT FOOT TRANSMETATARSAL AMPUTATION (Right )  Patient Location: PACU  Anesthesia Type:General  Level of Consciousness: drowsy  Airway & Oxygen Therapy: Patient Spontanous Breathing and Patient connected to nasal cannula oxygen  Post-op Assessment: Report given to RN, Post -op Vital signs reviewed and stable and Patient moving all extremities  Post vital signs: Reviewed and stable  Last Vitals:  Vitals Value Taken Time  BP 186/86 07/29/2018 12:59 PM  Temp    Pulse 75 07/29/2018 12:59 PM  Resp 23 07/29/2018 12:59 PM  SpO2 100 % 07/29/2018 12:59 PM  Vitals shown include unvalidated device data.  Last Pain:  Vitals:   07/28/18 2200  TempSrc:   PainSc: 0-No pain      Patients Stated Pain Goal: 1 (07/24/18 1130)  Complications: No apparent anesthesia complications

## 2018-07-29 NOTE — Progress Notes (Signed)
Orthopedic Tech Progress Note Patient Details:  Curtis Hodge [birth year] [id number]  Ortho Devices Type of Ortho Device: Postop shoe/boot Ortho Device/Splint Location: rle Ortho Device/Splint Interventions: Application   Post Interventions Patient Tolerated: Well Instructions Provided: Care of device   [name] 07/29/2018, 4:41 PM
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
## 2018-07-29 NOTE — Op Note (Signed)
07/29/2018  12:57 PM  PATIENT:  Curtis Hodge    PRE-OPERATIVE DIAGNOSIS:  Ulceration Right Foot, with osteomyelitis and infestation with maggots  POST-OPERATIVE DIAGNOSIS:  Same  PROCEDURE:  RIGHT FOOT TRANSMETATARSAL AMPUTATION, with application of incisional Praveena wound VAC  SURGEON:  Nadara Mustard, MD  PHYSICIAN ASSISTANT:None ANESTHESIA:   General  PREOPERATIVE INDICATIONS:  Curtis Hodge is a  52 y.o. male with a diagnosis of Ulceration Right Foot who failed conservative measures and elected for surgical management.    The risks benefits and alternatives were discussed with the patient preoperatively including but not limited to the risks of infection, bleeding, nerve injury, cardiopulmonary complications, the need for revision surgery, among others, and the patient was willing to proceed.  OPERATIVE IMPLANTS: Incisional Praveena wound VAC  @ENCIMAGES @  OPERATIVE FINDINGS: Calcified vessels with necrosis of the forefoot.  OPERATIVE PROCEDURE: Patient was brought the operating room and underwent a general anesthetic.  After adequate levels of anesthesia were obtained patient's right lower extremity was prepped using DuraPrep draped into a sterile field a timeout was called.  A fishmouth incision was made proximal to the necrotic skin and soft tissue and bone.  A oscillating saw was used to complete the transmetatarsal amputation.  Electrocautery was used for hemostasis.  There was significant calcification of the small vessels.  The wound was closed using 2-0 nylon.  Wound was irrigated with normal saline prior to closure.  No evidence of necrosis or abscess at the level of amputation.  A incisional Praveena wound VAC was applied this had a good suction fit patient was extubated taken the PACU in stable condition.   DISCHARGE PLANNING:  Antibiotic duration: Continue antibiotics for 24 hours postoperatively  Weightbearing: Nonweightbearing on the right  Pain  medication: Continue current medication  Dressing care/ Wound VAC: Leave wound VAC in place at discharge and continue with the wound VAC for 1 week  Ambulatory devices: Crutches or walker  Discharge to: Home.  I will follow-up in the office in 1 week.  Follow-up: In the office 1 week post operative.

## 2018-07-30 ENCOUNTER — Encounter (HOSPITAL_COMMUNITY): Payer: Self-pay | Admitting: Orthopedic Surgery

## 2018-07-30 DIAGNOSIS — N1330 Unspecified hydronephrosis: Secondary | ICD-10-CM

## 2018-07-30 DIAGNOSIS — J439 Emphysema, unspecified: Secondary | ICD-10-CM

## 2018-07-30 LAB — BASIC METABOLIC PANEL
ANION GAP: 8 (ref 5–15)
BUN: 12 mg/dL (ref 6–20)
CALCIUM: 8.9 mg/dL (ref 8.9–10.3)
CO2: 24 mmol/L (ref 22–32)
Chloride: 102 mmol/L (ref 98–111)
Creatinine, Ser: 1.21 mg/dL (ref 0.61–1.24)
GFR calc non Af Amer: >60 mL/min (ref >60)
Glucose, Bld: 108 mg/dL — ABNORMAL HIGH (ref 70–99)
POTASSIUM: 4.3 mmol/L (ref 3.5–5.1)
Sodium: 134 mmol/L — ABNORMAL LOW (ref 135–145)

## 2018-07-30 MED ORDER — MIRTAZAPINE 15 MG PO TABS: 15.0000 mg | ORAL_TABLET | Freq: Every day | ORAL | Status: DC

## 2018-07-30 MED ORDER — DARUN-COBIC-EMTRICIT-TENOFAF 800-150-200-10 MG PO TABS: 1.0000 | ORAL_TABLET | Freq: Every day | ORAL | 0 refills | Status: DC

## 2018-07-30 MED ORDER — HYDROCODONE-ACETAMINOPHEN 5-325 MG PO TABS: 1.0000 | ORAL_TABLET | ORAL | 0 refills | Status: DC | PRN

## 2018-07-30 MED ORDER — HYDROXYZINE HCL 25 MG PO TABS: 25.0000 mg | ORAL_TABLET | Freq: Three times a day (TID) | ORAL | 0 refills | Status: DC | PRN

## 2018-07-30 MED ORDER — OLANZAPINE 10 MG PO TABS: 10.0000 mg | ORAL_TABLET | Freq: Every day | ORAL | Status: DC

## 2018-07-30 MED ORDER — SULFAMETHOXAZOLE-TRIMETHOPRIM 800-160 MG PO TABS: 1.0000 | ORAL_TABLET | Freq: Every day | ORAL | Status: DC

## 2018-07-30 MED ORDER — SERTRALINE HCL 50 MG PO TABS: 50.0000 mg | ORAL_TABLET | Freq: Every day | ORAL | Status: DC

## 2018-07-30 MED ORDER — MOMETASONE FURO-FORMOTEROL FUM 100-5 MCG/ACT IN AERO: 2.0000 | INHALATION_SPRAY | Freq: Two times a day (BID) | RESPIRATORY_TRACT | Status: DC

## 2018-07-30 NOTE — Anesthesia Postprocedure Evaluation (Signed)
Anesthesia Post Note  Patient: Curtis Hodge  Procedure(s) Performed: RIGHT FOOT TRANSMETATARSAL AMPUTATION (Right )     Patient location during evaluation: PACU Anesthesia Type: General Level of consciousness: awake and alert Pain management: pain level controlled Vital Signs Assessment: post-procedure vital signs reviewed and stable Respiratory status: spontaneous breathing, nonlabored ventilation, respiratory function stable and patient connected to nasal cannula oxygen Cardiovascular status: blood pressure returned to baseline and stable Postop Assessment: no apparent nausea or vomiting Anesthetic complications: no    Last Vitals:  Vitals:   07/30/18 0407 07/30/18 0737  BP: (!) 143/97   Pulse: (!) 112   Resp: 20   Temp: 37.5 C   SpO2: 98% 98%    Last Pain:  Vitals:   07/30/18 1036  TempSrc:   PainSc: 7                  Emonii Wienke L Donnah Levert
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
## 2018-07-30 NOTE — Progress Notes (Signed)
Patient ID: Curtis Hodge, male   DOB: 1966/11/10, 52 y.o.   MRN: [id number] Postoperative day 1 transmetatarsal amputation.  Patient is resting comfortably this morning wound VAC is functioning well.  Patient may discharge to home with the portable Praveena wound VAC once he is safe with ambulation strict nonweightbearing on the right foot.  Discussed with the patient the importance of having support at home to care for the patient.  I will follow-up in 1 week.

## 2018-07-30 NOTE — Progress Notes (Signed)
Discharge instructions provided to the patient along with prescription x 1.  Patient requested to walk out of the hospital.  Patient was accompanied via a staff member.  Prevena (portable wound vac) in place.

## 2018-07-30 NOTE — Progress Notes (Signed)
Physical Therapy Discharge Patient Details Name: Curtis Hodge MRN: [id number] DOB: November 20, 1965 Today's Date: 07/30/2018 Time: 5859-2924 PT Time Calculation (min) (ACUTE ONLY): 25 min  Patient discharged from PT services secondary to goals met and no further PT needs identified.  Please see latest therapy progress note for current level of functioning and progress toward goals.    Progress and discharge plan discussed with patient and/or caregiver: Patient/Caregiver agrees with plan  GP     [place] 07/30/2018, 11:43 AM
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
## 2018-07-31 ENCOUNTER — Telehealth (INDEPENDENT_AMBULATORY_CARE_PROVIDER_SITE_OTHER): Payer: Self-pay | Admitting: Orthopedic Surgery

## 2018-07-31 NOTE — Telephone Encounter (Signed)
Lurena Joinerebecca -[facility] called asked if Dr Lajoyce Cornersuda will be willing to sign orders for patient foot infection and wound vac. [name] also asked if Dr Lajoyce Cornersuda will sign the 485. Patient was just approved for medicaid. [name] want to see the patient this weekend. The number to contact [name] is [phone]

## 2018-07-31 NOTE — Telephone Encounter (Signed)
IC [name] and advised patient has prevena wound vac at present, and usually that just stays on until patient f/u in office postop.  She will review d/c instructions and call Dr Lajoyce Cornersuda on cell if needed.

## 2018-08-01 ENCOUNTER — Encounter (HOSPITAL_COMMUNITY): Payer: Self-pay

## 2018-08-01 ENCOUNTER — Other Ambulatory Visit: Payer: Self-pay

## 2018-08-01 ENCOUNTER — Emergency Department (HOSPITAL_COMMUNITY)
Admission: EM | Admit: 2018-08-01 | Discharge: 2018-08-01 | Disposition: A | Discharge status: Home / Self Care | Payer: Medicaid Other | Attending: Emergency Medicine | Admitting: Emergency Medicine

## 2018-08-01 DIAGNOSIS — F17210 Nicotine dependence, cigarettes, uncomplicated: Secondary | ICD-10-CM | POA: Diagnosis not present

## 2018-08-01 DIAGNOSIS — B181 Chronic viral hepatitis B without delta-agent: Secondary | ICD-10-CM | POA: Insufficient documentation

## 2018-08-01 DIAGNOSIS — Z79899 Other long term (current) drug therapy: Secondary | ICD-10-CM | POA: Diagnosis not present

## 2018-08-01 DIAGNOSIS — Z89021 Acquired absence of right finger(s): Secondary | ICD-10-CM | POA: Diagnosis not present

## 2018-08-01 DIAGNOSIS — Z89429 Acquired absence of other toe(s), unspecified side: Secondary | ICD-10-CM | POA: Diagnosis not present

## 2018-08-01 DIAGNOSIS — Z5189 Encounter for other specified aftercare: Secondary | ICD-10-CM | POA: Insufficient documentation

## 2018-08-01 DIAGNOSIS — B20 Human immunodeficiency virus [HIV] disease: Secondary | ICD-10-CM | POA: Diagnosis not present

## 2018-08-01 DIAGNOSIS — Z590 Homelessness: Secondary | ICD-10-CM | POA: Diagnosis not present

## 2018-08-01 MED ORDER — FENTANYL CITRATE (PF) 100 MCG/2ML IJ SOLN
50.0000 ug | Freq: Once | INTRAMUSCULAR | Status: AC
  Administered 2018-08-01: 50 ug via INTRAVENOUS
  Filled 2018-08-01: qty 2
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
## 2018-08-01 NOTE — ED Triage Notes (Signed)
[facility]. Patient c/o right foot would pain. Patient has hx of right partial amputation and wound vac "leaks". Patient claims maggots seen in wound.

## 2018-08-01 NOTE — Discharge Instructions (Signed)
See Dr. Lajoyce Cornersuda in clinic on Monday for reevaluation. Return to the emergency department if you have any fevers, chills, or other concerns.

## 2018-08-03 ENCOUNTER — Telehealth: Payer: Self-pay

## 2018-08-03 ENCOUNTER — Ambulatory Visit (INDEPENDENT_AMBULATORY_CARE_PROVIDER_SITE_OTHER): Payer: Self-pay | Admitting: Infectious Diseases

## 2018-08-03 ENCOUNTER — Encounter: Payer: Self-pay | Admitting: Infectious Diseases

## 2018-08-03 VITALS — BP 121/85 | HR 102 | Temp 98.6°F | Wt 142.0 lb

## 2018-08-03 DIAGNOSIS — B20 Human immunodeficiency virus [HIV] disease: Secondary | ICD-10-CM

## 2018-08-03 DIAGNOSIS — Z0000 Encounter for general adult medical examination without abnormal findings: Secondary | ICD-10-CM

## 2018-08-03 DIAGNOSIS — L089 Local infection of the skin and subcutaneous tissue, unspecified: Secondary | ICD-10-CM

## 2018-08-03 DIAGNOSIS — J439 Emphysema, unspecified: Secondary | ICD-10-CM

## 2018-08-03 MED ORDER — MOMETASONE FURO-FORMOTEROL FUM 100-5 MCG/ACT IN AERO: 2.0000 | INHALATION_SPRAY | Freq: Two times a day (BID) | RESPIRATORY_TRACT | 5 refills | Status: DC

## 2018-08-03 MED ORDER — GABAPENTIN 300 MG PO CAPS: 300.0000 mg | ORAL_CAPSULE | Freq: Three times a day (TID) | ORAL | 2 refills | Status: DC

## 2018-08-03 MED ORDER — SULFAMETHOXAZOLE-TRIMETHOPRIM 800-160 MG PO TABS: 1.0000 | ORAL_TABLET | Freq: Every day | ORAL | 2 refills | Status: DC

## 2018-08-03 MED ORDER — DARUN-COBIC-EMTRICIT-TENOFAF 800-150-200-10 MG PO TABS: 1.0000 | ORAL_TABLET | Freq: Every day | ORAL | 0 refills | Status: DC

## 2018-08-03 NOTE — Assessment & Plan Note (Addendum)
Flu shot given at hospitalization prior to D/C. Has a new patient appointment for Internal Medicine Clinic in 8 days - he needs help with psych medication management and other chronic medical co morbidities as well as wellness prevention.

## 2018-08-03 NOTE — Telephone Encounter (Signed)
Per Rexene AlbertsStephanie Dixon, NP called Dr. Audrie Liauda's office to schedule a wound check and follow- up appointment for patient. Left a voice mail for Dr. Audrie Liauda's office to call back. Lorenso CourierJose L Maldonado, [place]MA

## 2018-08-03 NOTE — Assessment & Plan Note (Signed)
POD 5 now. Wound w/o signs of infection. No systemic symptoms indicating infection. Will Rx gabapentin 300 mg QHS to start with increase to TID dosing for nerve pain. We will call to help make a follow up appointment with Dr. Lajoyce Cornersuda for this week. Provided with 4x4, kerlix and betadine.

## 2018-08-03 NOTE — Assessment & Plan Note (Signed)
Refill Dulera inhaler through [facility] - of note he is on [employer]. Will need lowest dose d/t interaction.

## 2018-08-03 NOTE — Patient Instructions (Addendum)
Please call your foot surgeon Dr. Lajoyce Cornersuda to check your wound.  Address: [address], [place], [place] [address]  Phone: [phone]  Start gabapentin one capsule at bedtime. Once you are used to it increase slowly to three times a day (morning, late afternoon and night)  [phone] - [facility] on [address] for your prescriptions   Please return in 2 months to check in.
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
## 2018-08-03 NOTE — Assessment & Plan Note (Signed)
He has his Symtuza and reports taking it consistently. He is not taking his bactrim, however. Will send in Rx for this again for him to obtain at [facility] on [address]. He is in limbo with medication assistance and awaiting Medicaid to be activated - Caryl Pina is working on [employer] for [employer] and several other medications.   He met again with Lovena Le with [facility] today to help bridge him with case management assistance as most of his needs are centered around this.   Will check HIV VL and CD4 today. He needs to continue bactrim until I tell him to stop.

## 2018-08-04 LAB — T-HELPER CELL (CD4) - (RCID CLINIC ONLY)
CD4 % Helper T Cell: 20 % — ABNORMAL LOW (ref 33–55)
CD4 T Cell Abs: 280 /uL — ABNORMAL LOW (ref 400–2700)

## 2018-08-05 LAB — HIV-1 RNA QUANT-NO REFLEX-BLD
HIV 1 RNA Quant: 496 copies/mL — ABNORMAL HIGH
HIV-1 RNA Quant, Log: 2.7 Log copies/mL — ABNORMAL HIGH

## 2018-08-06 NOTE — Patient Instructions (Signed)
RN met with client for assessment. RN reviewed Transport planner, [place], Home Safety Management Information Booklet. Home Fire Safety Assessment, Fall Risk Assessment and Suicide Risk Assessment was performed. RN also discussed information on a Living Will, Advanced Directives, and [place]. RN and Client/Designated Party educated/reviewed/signed Client Agreement and Consent for Service form along with Patient Rights and Responsibilities statement. RN developed patient specific and centered care plan. RN provided contact information and reviewed how to receive emergency help after hours for schedule changes, billing questions, reporting of safety issues, falls, concerns or any needs/questions. Standard Precaution and Infection control along with interventions to correct or prevent high risk behaviors instructed to the patient. Client/Caregiver reports understanding and agreement with the above

## 2018-08-06 NOTE — Progress Notes (Signed)
   CHIEF COMPLAINT:   Chief Complaint  Patient presents with  . Referral    Offer assistance, consents signed    Subjective  [name] states he was released from jail about 3 weeks ago and needs assistance with following up on his disability benefits, MCD coverage, PCP, arranging an eye/vision appt, housing concerns, and a phone to ensure he can stay in care.     Objective : Mr. Curtis Hodge can into the clinic and during his visit a referral was received for me to offer assistance.  After speaking with [name], [facility] and his MCD is not active, we followed up with [name] to ensure his medications can be covered. Attempting to confirm coverage before contacted a PCP or arranging a vision appt. Referral sent to [name]/Bridge counselor to offer assistance with disability benefits, housing concerns and possible transportation needs. [place]ed [place] [employer] and purchased a minute tracfone for the patient, [facility] the [facility] and set the phone up to ensure he can communicate with our clinic and the pharmacy for refills   Examination:  General exam: Appears calm and comfortable  Respiratory system: Respiratory effort normal. HEENT: no thrush noted Cardiovascular system: No pedal edema. Gastrointestinal system: Abdomen is nondistended, no complaints of constipation or GI concerns including diarrhea Central nervous system: Alert and oriented. Extremities: Symmetric 5 x 5 power. Skin: No rashes, lesions or ulcers noted Psychiatry: Judgement and insight appear normal. Mood & affect appropriate.   VITALS: Co-Visit made during office visit  I personally reviewed Labs under Results section. Results for Curtis SinkMCLEOD, Curtis Hodge (MRN [id number]) as of 08/06/2018 09:21  Ref. Range 07/13/2018 11:16  HIV 1 RNA Quant Latest Ref Range: NOT DETECT copies/mL 22,200 (H)       Assessment/Plan:  Create a individualized plan of care and continue  to follow up on patient needs. Home visit wasn't  offered but care can still be provided.  Code Status: Full  Family Communication: Lives with mom and sister who he states are very supportive  Disposition Plan: States he can continue to live with him mom but must walk to the bus stop which is over a 30 minute walk for him. He would like assistance with housing to be closer to the city and resources  Time spent: total of about 3 hours not including drive times  Matej Sappenfield, Ailene RudAmbre Lashea

## 2018-08-11 ENCOUNTER — Other Ambulatory Visit: Payer: Self-pay

## 2018-08-11 ENCOUNTER — Ambulatory Visit (INDEPENDENT_AMBULATORY_CARE_PROVIDER_SITE_OTHER): Payer: Self-pay | Admitting: Internal Medicine

## 2018-08-11 DIAGNOSIS — R683 Clubbing of fingers: Secondary | ICD-10-CM

## 2018-08-11 DIAGNOSIS — F1910 Other psychoactive substance abuse, uncomplicated: Secondary | ICD-10-CM

## 2018-08-11 DIAGNOSIS — Z7951 Long term (current) use of inhaled steroids: Secondary | ICD-10-CM

## 2018-08-11 DIAGNOSIS — Z89021 Acquired absence of right finger(s): Secondary | ICD-10-CM

## 2018-08-11 DIAGNOSIS — Z89421 Acquired absence of other right toe(s): Secondary | ICD-10-CM

## 2018-08-11 DIAGNOSIS — H538 Other visual disturbances: Secondary | ICD-10-CM

## 2018-08-11 DIAGNOSIS — Z720 Tobacco use: Secondary | ICD-10-CM

## 2018-08-11 DIAGNOSIS — J439 Emphysema, unspecified: Secondary | ICD-10-CM

## 2018-08-11 DIAGNOSIS — Z79899 Other long term (current) drug therapy: Secondary | ICD-10-CM

## 2018-08-11 DIAGNOSIS — B20 Human immunodeficiency virus [HIV] disease: Secondary | ICD-10-CM

## 2018-08-11 DIAGNOSIS — Z87828 Personal history of other (healed) physical injury and trauma: Secondary | ICD-10-CM

## 2018-08-11 DIAGNOSIS — Z0000 Encounter for general adult medical examination without abnormal findings: Secondary | ICD-10-CM

## 2018-08-11 DIAGNOSIS — L089 Local infection of the skin and subcutaneous tissue, unspecified: Secondary | ICD-10-CM
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
## 2018-08-11 NOTE — Assessment & Plan Note (Signed)
Last CD 4: 280, HIV RNA: 496. Has been on Bactrim due to lower CD4. Is on Symtuza. [facility] -Continue Symtuza -Follow up with ID

## 2018-08-11 NOTE — Patient Instructions (Addendum)
It was our pleasure taking care of you in our clinic today. Your you were here to establish care with us. Your right foot dressing is clean with no drainage around that. Please follow up with your orthopedic surgeon as schedule. I reviewed your chart, your CD 4 marker (HIV marker) has improved, please continue taking your medications and follow up with infectious disease. Your lung exam is ok today, please continue using your inhaler.You also asked for referral to eye doctor and as we decided, we go forward for it as soon as you have your Medicaid approved.  You can come back for your PCP visit in 3-6 months or earlier as needed.  If you noticed any fever, redness or sever pain on your foot around surgery site, please contact us as soon as possible.  Thanks Dr. Maryla MorrowMasoudi
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
## 2018-08-11 NOTE — Assessment & Plan Note (Signed)
It has been a chronic problem, he was waiting to get insurance and then taking care of that. Planned to refer him to opthalmology as soon as his medicaid will be approved. (He will call us next week about that)

## 2018-08-11 NOTE — Assessment & Plan Note (Signed)
Symptoms are controled with Dulera. Lung exam is normal without wheeze. Clubbing is present. May consider adding LAMA in future -[facility]

## 2018-08-17 NOTE — Progress Notes (Signed)
Internal Medicine Clinic Attending  I saw and evaluated the patient.  I personally confirmed the key portions of the history and exam documented by Dr. Masoudi and I reviewed pertinent patient test results.  The assessment, diagnosis, and plan were formulated together and I agree with the documentation in the resident's note. 

## 2018-08-20 ENCOUNTER — Ambulatory Visit (INDEPENDENT_AMBULATORY_CARE_PROVIDER_SITE_OTHER): Payer: Medicaid Other | Admitting: Emergency Medicine

## 2018-08-20 ENCOUNTER — Other Ambulatory Visit: Payer: Self-pay

## 2018-08-20 ENCOUNTER — Encounter: Payer: Self-pay | Admitting: Emergency Medicine

## 2018-08-20 VITALS — BP 128/70 | HR 100 | Ht 70.0 in | Wt 156.0 lb

## 2018-08-20 DIAGNOSIS — R400 Somnolence: Secondary | ICD-10-CM | POA: Diagnosis not present

## 2018-08-20 DIAGNOSIS — R0683 Snoring: Secondary | ICD-10-CM | POA: Diagnosis not present

## 2018-08-20 DIAGNOSIS — J439 Emphysema, unspecified: Secondary | ICD-10-CM
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
## 2018-08-20 NOTE — Assessment & Plan Note (Signed)
Significant COPD and large areas of regional bullous emphysema especially in his right upper lobe.  He is not currently on scheduled bronchodilators due to cost.  Clearly he needs to initiate.  His pulmonary function testing to quantify his degree of obstruction.  Question whether he may at some point be a candidate for bullectomy.  We can discuss this going forward  We will try starting Bevespi 2 puffs twice a day.  Keep track of with you feel like this medication helps your breathing Keep your albuterol available to use 2 puffs up to every 4 hours as needed for chest tightness, wheezing, shortness of breath. We will perform full pulmonary function testing at your next visit. We will arrange for alpha 1 antitrypsin deficiency testing.

## 2018-08-20 NOTE — Patient Instructions (Signed)
We will try starting Bevespi 2 puffs twice a day.  Keep track of with you feel like this medication helps your breathing Keep your albuterol available to use 2 puffs up to every 4 hours as needed for chest tightness, wheezing, shortness of breath. We will perform full pulmonary function testing at your next visit. We will arrange for a split-night sleep study. We will perform blood work today. Congratulations on decreasing your smoking.  We need to try to work hard to stop altogether. Follow with Dr Delton Coombes 1 month or next available with full PFT on the same day.

## 2018-08-20 NOTE — Assessment & Plan Note (Signed)
He has nocturnal snoring, dyspnea when he is supine, daytime sleepiness.  Suspect that he has some degree of sleep apnea, he questions whether he is soft nasal septum may be a contributor to his obstruction.  I think he needs split-night sleep study and we will arrange.

## 2018-08-24 ENCOUNTER — Telehealth: Payer: Self-pay

## 2018-08-24 ENCOUNTER — Other Ambulatory Visit: Payer: Self-pay | Admitting: Internal Medicine

## 2018-08-24 DIAGNOSIS — L089 Local infection of the skin and subcutaneous tissue, unspecified: Secondary | ICD-10-CM

## 2018-08-24 NOTE — Telephone Encounter (Signed)
Per Judeth Cornfield, NP spoke to patient to verify that he had started Bactrim, patient stated that he had started taking as soon as prescribed, scheduled f/u appointment to have labs and  see Judeth Cornfield.

## 2018-08-24 NOTE — Telephone Encounter (Signed)
-----   Message from Blanchard Kelch, NP sent at 07/14/2018 10:37 AM EDT ----- Patient CD4 count < 200. Please call him to let him know he needs to start taking bactrim antibiotic 1 tablet once a day to protect him from infections. I have sent in this medication to [facility] on [address].

## 2018-08-25 ENCOUNTER — Telehealth (INDEPENDENT_AMBULATORY_CARE_PROVIDER_SITE_OTHER): Payer: Self-pay | Admitting: Orthopedic Surgery

## 2018-08-25 ENCOUNTER — Telehealth: Payer: Self-pay | Admitting: *Deleted

## 2018-08-25 ENCOUNTER — Other Ambulatory Visit: Payer: Self-pay

## 2018-08-25 DIAGNOSIS — J439 Emphysema, unspecified: Secondary | ICD-10-CM

## 2018-08-25 DIAGNOSIS — B20 Human immunodeficiency virus [HIV] disease: Secondary | ICD-10-CM

## 2018-08-25 DIAGNOSIS — B181 Chronic viral hepatitis B without delta-agent: Secondary | ICD-10-CM

## 2018-08-25 MED ORDER — DARUN-COBIC-EMTRICIT-TENOFAF 800-150-200-10 MG PO TABS: 1.0000 | ORAL_TABLET | Freq: Every day | ORAL | 4 refills | Status: DC

## 2018-08-25 MED ORDER — MOMETASONE FURO-FORMOTEROL FUM 100-5 MCG/ACT IN AERO: 2.0000 | INHALATION_SPRAY | Freq: Two times a day (BID) | RESPIRATORY_TRACT | 5 refills | Status: DC

## 2018-08-25 NOTE — Telephone Encounter (Signed)
Patient called stating that he is in a lot of pain and just came from the pharmacy and was told that they did not have any pain medication for him.  Patient is requesting something for pain.  CB#[phone].  Thank you.

## 2018-08-25 NOTE — Telephone Encounter (Signed)
rtc to pt, he is upset that [facility] has not prescribed pain meds for him. I made him aware that he needs to be seen in clinic by md for script of pain meds. Explained that it was stephanie dixon and a dr Red Christians that had done scripts before, [facility] 10/9 at 1545

## 2018-08-25 NOTE — Telephone Encounter (Signed)
Requesting to speak with a nurse about pain med. Please call pt back.  

## 2018-08-25 NOTE — Telephone Encounter (Signed)
Patient called, upset that [facility] gave him old medication. RN advised his Darrell Jewel was sent to a different pharmacy, as he had 5 on his profile. RN cleaned up pharmacy preferences, he would like to use [facility]. He picked up Descovy and Bactrim today.  He was upset they did not give him Gabapentin prescribed by Judeth Cornfield.  He will NOT take Descovy. RN called [facility], discontinued old HIV regimen of Descovy/Prezcobix. RN sent new prescriptions for Symtuza and Elwin Sleight, asked pharmacist to please fill gabapentin. Andree Coss, RN
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
## 2018-08-26 ENCOUNTER — Encounter: Payer: Self-pay | Admitting: Internal Medicine

## 2018-08-26 ENCOUNTER — Encounter (INDEPENDENT_AMBULATORY_CARE_PROVIDER_SITE_OTHER): Payer: Self-pay | Admitting: Family

## 2018-08-26 ENCOUNTER — Ambulatory Visit (INDEPENDENT_AMBULATORY_CARE_PROVIDER_SITE_OTHER): Payer: Medicaid Other | Admitting: Family

## 2018-08-26 ENCOUNTER — Ambulatory Visit: Payer: Self-pay

## 2018-08-26 VITALS — Ht 70.0 in | Wt 156.0 lb

## 2018-08-26 DIAGNOSIS — L089 Local infection of the skin and subcutaneous tissue, unspecified: Secondary | ICD-10-CM

## 2018-08-26 DIAGNOSIS — Z89431 Acquired absence of right foot: Secondary | ICD-10-CM

## 2018-08-26 LAB — ALPHA-1 ANTITRYPSIN PHENOTYPE: A1 ANTITRYPSIN SER: 156 mg/dL (ref 83–199)
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
## 2018-09-22 ENCOUNTER — Other Ambulatory Visit: Payer: Self-pay

## 2018-09-23 ENCOUNTER — Ambulatory Visit: Payer: Self-pay | Admitting: Emergency Medicine

## 2018-09-23 ENCOUNTER — Ambulatory Visit (HOSPITAL_BASED_OUTPATIENT_CLINIC_OR_DEPARTMENT_OTHER): Payer: Medicaid Other | Attending: Emergency Medicine | Admitting: Pulmonary Disease

## 2018-09-23 VITALS — Ht 70.0 in | Wt 155.0 lb

## 2018-09-23 DIAGNOSIS — R400 Somnolence: Secondary | ICD-10-CM

## 2018-09-23 DIAGNOSIS — G4733 Obstructive sleep apnea (adult) (pediatric): Secondary | ICD-10-CM | POA: Insufficient documentation

## 2018-09-28 DIAGNOSIS — G4733 Obstructive sleep apnea (adult) (pediatric): Secondary | ICD-10-CM

## 2018-09-28 NOTE — Procedures (Signed)
  Patient Name: Curtis Hodge, Curtis Hodge Date: 09/23/2018   Gender: Male  D.O.B: 05/17/1966  Age (years): 52  Referring Provider: Levy Pupa  Height (inches): 70  Interpreting Physician: Cyril Mourning MD, ABSM  Weight (lbs): 155  RPSGT: Ulyess Mort  BMI: 22  MRN: [id number]  Neck Size: 14.00  <br> <br>  CLINICAL INFORMATION  Sleep Study Type: NPSG Indication for sleep study: Depression, Fatigue, Snoring Epworth Sleepiness Score: 13 SLEEP STUDY TECHNIQUE  As per the AASM Manual for the Scoring of Sleep and Associated Events v2.3 (April 2016) with a hypopnea requiring 4% desaturations. The channels recorded and monitored were frontal, central and occipital EEG, electrooculogram (EOG), submentalis EMG (chin), nasal and oral airflow, thoracic and abdominal wall motion, anterior tibialis EMG, snore microphone, electrocardiogram, and pulse oximetry. MEDICATIONS  Medications self-administered by patient taken the night of the study : REMERON, SYMTUZA, DULERA SLEEP ARCHITECTURE  The study was initiated at 11:05:14 PM and ended at 5:06:35 AM. Sleep onset time was 0.0 minutes and the sleep efficiency was 95.9%%. The total sleep time was 346.7 minutes. Stage REM latency was 72.7 minutes. The patient spent 23.6%% of the night in stage N1 sleep, 54.8%% in stage N2 sleep, 0.0%% in stage N3 and 21.6% in REM. Alpha intrusion was absent. Supine sleep was 89.75%. RESPIRATORY PARAMETERS  The overall apnea/hypopnea index (AHI) was 12.5 per hour. There were 38 total apneas, including 17 obstructive, 18 central and 3 mixed apneas. There were 34 hypopneas and 39 RERAs with RDI 19/h The AHI during Stage REM sleep was 24.0 per hour. AHI while supine was 8.7 per hour. The mean oxygen saturation was 96.0%. The minimum SpO2 during sleep was 85.0%. moderate snoring was noted during this study. CARDIAC DATA  The 2 lead EKG demonstrated sinus rhythm. The mean heart rate was 71.6 beats per minute. Other EKG  findings include: PVCs.  LEG MOVEMENT DATA  The total PLMS were 0 with a resulting PLMS index of 0.0. Associated arousal with leg movement index was 0.0 . IMPRESSIONS  - Moderate obstructive sleep apnea occurred during this study (AHI = 12.5/h). - No significant central sleep apnea occurred during this study (CAI = 3.1/h).  - Mild oxygen desaturation was noted during this study (Min O2 = 85.0%).  - The patient snored with moderate snoring volume.  - EKG findings include PVCs.  - Clinically significant periodic limb movements did not occur during sleep. No significant associated arousals. DIAGNOSIS  - Obstructive Sleep Apnea (327.23 [G47.33 ICD-10]) RECOMMENDATIONS  - Therapeutic CPAP titration to determine optimal pressure required to alleviate sleep disordered breathing. Alternatively, autoCPAP could be used with a medium full face mask - Avoid alcohol, sedatives and other CNS depressants that may worsen sleep apnea and disrupt normal sleep architecture.  - Sleep hygiene should be reviewed to assess factors that may improve sleep quality.  - Weight management and regular exercise should be initiated or continued if appropriate.   Cyril Mourning MD Board Certified in Sleep medicine

## 2018-10-06 ENCOUNTER — Encounter: Payer: Medicaid Other | Admitting: Infectious Diseases

## 2018-10-12 ENCOUNTER — Telehealth: Payer: Self-pay | Admitting: *Deleted

## 2018-10-12 NOTE — Telephone Encounter (Signed)
The intent of this communication is to inform the Health Care Team that this patient will be discharged from [facility]).  [facility] will be willing to reopen the patient to services if and when the patient is ready to discuss medication adherence and HIV disease  management. Effective 10/12/18 patient will be discharged and [facility].

## 2018-10-26 ENCOUNTER — Encounter: Payer: Self-pay | Admitting: Infectious Diseases

## 2018-10-26 ENCOUNTER — Ambulatory Visit (INDEPENDENT_AMBULATORY_CARE_PROVIDER_SITE_OTHER): Payer: Medicaid Other | Admitting: Infectious Diseases

## 2018-10-26 VITALS — BP 144/92 | HR 98 | Temp 98.0°F | Ht 70.0 in | Wt 155.0 lb

## 2018-10-26 DIAGNOSIS — F17210 Nicotine dependence, cigarettes, uncomplicated: Secondary | ICD-10-CM | POA: Diagnosis not present

## 2018-10-26 DIAGNOSIS — B181 Chronic viral hepatitis B without delta-agent: Secondary | ICD-10-CM | POA: Diagnosis not present

## 2018-10-26 DIAGNOSIS — R825 Elevated urine levels of drugs, medicaments and biological substances: Secondary | ICD-10-CM | POA: Diagnosis not present

## 2018-10-26 DIAGNOSIS — B20 Human immunodeficiency virus [HIV] disease: Secondary | ICD-10-CM | POA: Diagnosis present

## 2018-10-26 DIAGNOSIS — J439 Emphysema, unspecified: Secondary | ICD-10-CM | POA: Diagnosis not present

## 2018-10-26 DIAGNOSIS — F419 Anxiety disorder, unspecified: Secondary | ICD-10-CM | POA: Diagnosis not present

## 2018-10-26 DIAGNOSIS — Z23 Encounter for immunization: Secondary | ICD-10-CM | POA: Diagnosis not present

## 2018-10-26 DIAGNOSIS — Z89431 Acquired absence of right foot: Secondary | ICD-10-CM

## 2018-10-26 DIAGNOSIS — R51 Headache: Secondary | ICD-10-CM | POA: Diagnosis not present

## 2018-10-26 DIAGNOSIS — F1910 Other psychoactive substance abuse, uncomplicated: Secondary | ICD-10-CM

## 2018-10-26 DIAGNOSIS — Z79899 Other long term (current) drug therapy: Secondary | ICD-10-CM | POA: Diagnosis not present

## 2018-10-26 DIAGNOSIS — R0683 Snoring: Secondary | ICD-10-CM | POA: Diagnosis not present

## 2018-10-26 DIAGNOSIS — F329 Major depressive disorder, single episode, unspecified: Secondary | ICD-10-CM
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
## 2018-10-26 NOTE — Patient Instructions (Addendum)
Nice to see you today!   Will check your blood work today to see how your Curtis Hodge is working   Please call the following: For another boot - call Dr. Lajoyce Cornersuda (orthopedic surgeon) office:  Address: [address], [place], [place] [address]  Phone: [phone]   For your sleep mask call Dr. Vassie LollAlva / Dr. Delton [facility]esByrum at [facility]  Address: [address], [place], [place] [address]  Phone: [phone] Let them know you had a sleep study in November and need results and if you need a CPAP mask.  They also wanted to do a pulmonary function test for you to help them with your inhalers   Please come back in 3 months with labs 2 weeks before your visit.

## 2018-10-27 NOTE — Assessment & Plan Note (Signed)
Viral load assessed last in August - will reassess Hep B DNA and LFTs at next appointment to monitor. [facility] considering he has chronic infection, AA male and > 51 yo. His Hep C Ab was negative - will need yearly screening with intranasal drug use. Annual HBsAg.  Continue tenofovir via Symtuza.

## 2018-10-27 NOTE — Assessment & Plan Note (Signed)
Will recheck VL/CD4 today >> last viral load down to 496; reports good adherence. Would suspect him to be suppressed now. Can stop his bactrim pending lab results for 5417m OI proph beyond reconstitution of CD4 > 200.  He declined STI treatment today and not at risk for oropharyngeal or rectal GC/C per his disclosed sexual preferences.

## 2018-10-27 NOTE — Assessment & Plan Note (Signed)
Improved with Remeron - will continue for 6 months and reassess need. He has a history of [facility] services in the past and low threshold to refer him back.
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
## 2018-10-27 NOTE — Assessment & Plan Note (Signed)
Following with Dr. Delton CoombesByrum. Has had some improvement on inhalers - encouraged to call for follow up appt.

## 2018-10-27 NOTE — Assessment & Plan Note (Signed)
Looks like he qualifies for CPAP - I asked him to please call the office to obtain results and discus next steps. He likely feels poorly due to this.

## 2018-10-28 DIAGNOSIS — B20 Human immunodeficiency virus [HIV] disease: Secondary | ICD-10-CM | POA: Diagnosis not present

## 2018-10-28 DIAGNOSIS — Z23 Encounter for immunization: Secondary | ICD-10-CM

## 2018-10-28 LAB — RPR: RPR Ser Ql: NONREACTIVE

## 2018-10-28 LAB — HIV-1 RNA QUANT-NO REFLEX-BLD
HIV 1 RNA QUANT: 55200 {copies}/mL — AB
HIV-1 RNA Quant, Log: 4.74 Log copies/mL — ABNORMAL HIGH

## 2018-10-29 LAB — T-HELPER CELL (CD4) - (RCID CLINIC ONLY)
CD4 % Helper T Cell: 19 % — ABNORMAL LOW (ref 33–55)
CD4 T Cell Abs: 220 /uL — ABNORMAL LOW (ref 400–2700)

## 2018-11-23 ENCOUNTER — Telehealth (INDEPENDENT_AMBULATORY_CARE_PROVIDER_SITE_OTHER): Payer: Self-pay | Admitting: Orthopedic Surgery

## 2018-11-23 NOTE — Telephone Encounter (Signed)
Patient called needing a cane, check on carbon fiber prosthetic(where does he go do get that?)where does he need to get a shoe(sandle w/wooden bottom).  Please call patient to verify equipment.  [phone]

## 2018-11-24 NOTE — Telephone Encounter (Signed)
Can you please call pt and make appt for him to come in and see Lilyan Gilford or Shawn please. He was supposed to follow up in October and never came back. We can address all these concerns at that appt.

## 2018-11-25 ENCOUNTER — Telehealth: Payer: Self-pay | Admitting: *Deleted

## 2018-11-25 ENCOUNTER — Encounter (INDEPENDENT_AMBULATORY_CARE_PROVIDER_SITE_OTHER): Payer: Self-pay | Admitting: Family

## 2018-11-25 ENCOUNTER — Ambulatory Visit (INDEPENDENT_AMBULATORY_CARE_PROVIDER_SITE_OTHER): Payer: Medicaid Other | Admitting: Family

## 2018-11-25 ENCOUNTER — Telehealth: Payer: Self-pay | Admitting: Behavioral Health

## 2018-11-25 VITALS — Ht 70.0 in | Wt 155.0 lb

## 2018-11-25 DIAGNOSIS — L02611 Cutaneous abscess of right foot: Secondary | ICD-10-CM | POA: Diagnosis not present

## 2018-11-25 DIAGNOSIS — Z89431 Acquired absence of right foot: Secondary | ICD-10-CM

## 2018-11-25 MED ORDER — DOXYCYCLINE HYCLATE 100 MG PO TABS: 100.0000 mg | ORAL_TABLET | Freq: Two times a day (BID) | ORAL | 0 refills | Status: DC

## 2018-11-25 NOTE — Telephone Encounter (Signed)
I have reached out to [place] as well to help reinforce our points to him. Thank you and I appreciate your efforts to help Curtis Hodge.

## 2018-11-25 NOTE — Telephone Encounter (Signed)
Patient called today yelling in the phone that he is upset because he still has not received a cane, wooden shoe and carbon fiber prosthetic.  Patient states he is tired of calling offices and being told he needs an appointment.  Informed patient to call Dr. Audrie Lia office to follow up because he needs to schedule an appointment.  Patient states he is not going to call them and he Is going to stop answering the phone if this is the way it is.  Explained to patient he needs to keep his follow up appointments so he can be evaluated.  Patient appeared to verbalize understanding. Angeline Slim RN

## 2018-11-25 NOTE — Telephone Encounter (Signed)
Contacted the patient today to make he aware that [facility] have a cane that we can give him to decrease his risk of falling. Message left

## 2018-11-26 ENCOUNTER — Ambulatory Visit: Payer: Self-pay | Admitting: Pharmacist

## 2018-12-02 ENCOUNTER — Encounter (INDEPENDENT_AMBULATORY_CARE_PROVIDER_SITE_OTHER): Payer: Self-pay | Admitting: Family
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
## 2018-12-03 ENCOUNTER — Ambulatory Visit (INDEPENDENT_AMBULATORY_CARE_PROVIDER_SITE_OTHER): Payer: Medicaid Other | Admitting: Orthopedic Surgery

## 2018-12-14 ENCOUNTER — Ambulatory Visit (INDEPENDENT_AMBULATORY_CARE_PROVIDER_SITE_OTHER): Payer: Medicaid Other | Admitting: Orthopedic Surgery

## 2018-12-22 ENCOUNTER — Ambulatory Visit: Payer: Self-pay | Admitting: Pharmacist

## 2019-01-01 NOTE — Progress Notes (Signed)
ATC unable to leave message.  

## 2019-01-04 ENCOUNTER — Encounter: Payer: Self-pay | Admitting: *Deleted

## 2019-01-25 ENCOUNTER — Telehealth: Payer: Self-pay | Admitting: Pharmacy Technician

## 2019-01-25 ENCOUNTER — Ambulatory Visit (INDEPENDENT_AMBULATORY_CARE_PROVIDER_SITE_OTHER): Payer: Self-pay | Admitting: Infectious Diseases

## 2019-01-25 ENCOUNTER — Encounter: Payer: Self-pay | Admitting: Infectious Diseases

## 2019-01-25 VITALS — BP 121/89 | HR 73 | Temp 98.3°F | Ht 70.0 in | Wt 150.0 lb

## 2019-01-25 DIAGNOSIS — B20 Human immunodeficiency virus [HIV] disease: Secondary | ICD-10-CM

## 2019-01-25 DIAGNOSIS — B181 Chronic viral hepatitis B without delta-agent: Secondary | ICD-10-CM

## 2019-01-25 DIAGNOSIS — Z89431 Acquired absence of right foot: Secondary | ICD-10-CM

## 2019-01-25 DIAGNOSIS — H5711 Ocular pain, right eye: Secondary | ICD-10-CM

## 2019-01-25 MED ORDER — ERYTHROMYCIN 5 MG/GM OP OINT: 1.0000 "application " | TOPICAL_OINTMENT | Freq: Two times a day (BID) | OPHTHALMIC | 1 refills | Status: DC

## 2019-01-25 MED ORDER — OLOPATADINE HCL 0.1 % OP SOLN: 1.0000 [drp] | Freq: Two times a day (BID) | OPHTHALMIC | 0 refills | Status: DC

## 2019-01-25 NOTE — Assessment & Plan Note (Signed)
Unilateral swelling with injected/hemorrhage to conjunctiva. With absence of other atopic symptoms suspect this is likely not a primary allergic/viral process. No purulent drainage noted or reported. With the pain, difficulty opening his eye and photophobia I suspect a corneal abrasion. Would avoid steroid drops in this setting. Give erythromycin ointment BID and pataday allergy gtts to use. Advised good hand hygiene and avoid touching face.  Ophthalmology referral - can be seen today.

## 2019-01-25 NOTE — Progress Notes (Signed)
Referral coordinator able to get patient scheduled with opthalmology later this afternoon. Patient very appreciative of this. Bus passes also provided for appointment transportation due to [place] not being available.  Valarie Cones, LPN

## 2019-01-25 NOTE — Assessment & Plan Note (Signed)
Unfortunately his viral load at last visit is indicative he is not taking any of his Symtuza. He will not completely admit to this. Will repeat VL/CD4 today. Counseled on proper medication use. He needs daily adherence to ensure he does not develop resistance mutations. Advised he is infectious to other sexual partners and needs to abstain or use condoms explicitly as well as disclose to partners.  Return in about 2 months (around 03/27/2019).

## 2019-01-25 NOTE — Assessment & Plan Note (Signed)
Will check CMET to monitor LFTs for flare as he is intermittently adherent to his Symtuza. Last DNA in August 2019. Reinforced that we are trying to treat two conditions for him with his Symtuza.  He should get a liver ultrasound/elastography baseline. Will work to arrange this for him.  Needs repeated DNA, eAg/Ab, sAg next lab draw.

## 2019-01-25 NOTE — Telephone Encounter (Signed)
[facility] Patient Advocate Encounter  Patient attempted to fill Patanol at [facility] but was told medication not covered by [facility] Medicaid.  Patient picked up Erythromycin only this morning.  He has an eye appointment today at 15:00 and will follow-up then to see which is drop the eye doctor prefers  Two covered eye drops under Medicaid for $3.00 copay are: Pataday 0.2% eye drop Pazeo 0.7% eye drop  Patient was given this information.  Beulah Gandy, CPhT Specialty Pharmacy Patient [facility] Phone: [phone] Fax: [phone] 01/25/2019 11:41 AM

## 2019-01-25 NOTE — Assessment & Plan Note (Signed)
I had a cane available to give him today. He was most appreciative of this. Advised to continue regular follow up care with orthopedics as they direct. He is not taking the gabapentin I prescribed him.

## 2019-01-25 NOTE — Patient Instructions (Signed)
Please continue your symtuza once a day - very important to try not to miss doses.   For your eye - will start a drop for itching/burning and then start an ointment to help. I suspect you may have an abrasion to your eye. There does not appear to be any infection in it.   I want to get you in to see an ophthalmologist for evaluation.   Please return in 2 months.

## 2019-01-25 NOTE — Telephone Encounter (Signed)
I am so glad we could get him in to see an eye doctor so soon. Thank you so much.
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
## 2019-01-27 LAB — RPR: RPR: NONREACTIVE

## 2019-01-27 LAB — HIV-1 RNA QUANT-NO REFLEX-BLD
HIV 1 RNA QUANT: 37600 {copies}/mL — AB
HIV-1 RNA Quant, Log: 4.58 Log copies/mL — ABNORMAL HIGH

## 2019-02-10 ENCOUNTER — Telehealth: Payer: Medicaid Other | Admitting: Licensed Clinical Social Worker

## 2019-02-25 ENCOUNTER — Emergency Department (HOSPITAL_COMMUNITY)
Admission: EM | Admit: 2019-02-25 | Discharge: 2019-02-26 | Disposition: A | Discharge status: Home / Self Care | Payer: Medicaid Other | Attending: Emergency Medicine | Admitting: Emergency Medicine

## 2019-02-25 ENCOUNTER — Other Ambulatory Visit: Payer: Self-pay

## 2019-02-25 DIAGNOSIS — Z79899 Other long term (current) drug therapy: Secondary | ICD-10-CM | POA: Insufficient documentation

## 2019-02-25 DIAGNOSIS — F17210 Nicotine dependence, cigarettes, uncomplicated: Secondary | ICD-10-CM | POA: Diagnosis not present

## 2019-02-25 DIAGNOSIS — R4689 Other symptoms and signs involving appearance and behavior: Secondary | ICD-10-CM

## 2019-02-25 DIAGNOSIS — Z89431 Acquired absence of right foot: Secondary | ICD-10-CM | POA: Insufficient documentation

## 2019-02-25 DIAGNOSIS — Z21 Asymptomatic human immunodeficiency virus [HIV] infection status: Secondary | ICD-10-CM | POA: Diagnosis not present

## 2019-02-25 LAB — CBG MONITORING, ED: Glucose-Capillary: 103 mg/dL — ABNORMAL HIGH (ref 70–99)

## 2019-02-25 LAB — COMPREHENSIVE METABOLIC PANEL
ALT: 28 U/L (ref 0–44)
AST: 44 U/L — ABNORMAL HIGH (ref 15–41)
Albumin: 3.6 g/dL (ref 3.5–5.0)
Alkaline Phosphatase: 94 U/L (ref 38–126)
Anion gap: 14 (ref 5–15)
BUN: 12 mg/dL (ref 6–20)
CO2: 21 mmol/L — ABNORMAL LOW (ref 22–32)
Calcium: 9.2 mg/dL (ref 8.9–10.3)
Chloride: 106 mmol/L (ref 98–111)
Creatinine, Ser: 1.82 mg/dL — ABNORMAL HIGH (ref 0.61–1.24)
GFR calc Af Amer: 48 mL/min — ABNORMAL LOW (ref >60)
GFR calc non Af Amer: 42 mL/min — ABNORMAL LOW (ref >60)
Glucose, Bld: 114 mg/dL — ABNORMAL HIGH (ref 70–99)
Potassium: 3.5 mmol/L (ref 3.5–5.1)
Sodium: 141 mmol/L (ref 135–145)
Total Bilirubin: 0.5 mg/dL (ref 0.3–1.2)
Total Protein: 7.4 g/dL (ref 6.5–8.1)

## 2019-02-25 LAB — CBC WITH DIFFERENTIAL/PLATELET
Abs Immature Granulocytes: 0.03 10*3/uL (ref 0.00–0.07)
Basophils Absolute: 0 10*3/uL (ref 0.0–0.1)
Basophils Relative: 0 %
Eosinophils Absolute: 0 10*3/uL (ref 0.0–0.5)
Eosinophils Relative: 0 %
HCT: 37 % — ABNORMAL LOW (ref 39.0–52.0)
Hemoglobin: 12 g/dL — ABNORMAL LOW (ref 13.0–17.0)
Immature Granulocytes: 1 %
Lymphocytes Relative: 6 %
Lymphs Abs: 0.3 10*3/uL — ABNORMAL LOW (ref 0.7–4.0)
MCH: 31.5 pg (ref 26.0–34.0)
MCHC: 32.4 g/dL (ref 30.0–36.0)
MCV: 97.1 fL (ref 80.0–100.0)
Monocytes Absolute: 0.3 10*3/uL (ref 0.1–1.0)
Monocytes Relative: 4 %
Neutro Abs: 5.5 10*3/uL (ref 1.7–7.7)
Neutrophils Relative %: 89 %
Platelets: 107 10*3/uL — ABNORMAL LOW (ref 150–400)
RBC: 3.81 MIL/uL — ABNORMAL LOW (ref 4.22–5.81)
RDW: 12.1 % (ref 11.5–15.5)
WBC: 6.1 10*3/uL (ref 4.0–10.5)
nRBC: 0 % (ref 0.0–0.2)

## 2019-02-25 LAB — ETHANOL: Alcohol, Ethyl (B): <10 mg/dL (ref <10)
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
## 2019-02-25 NOTE — ED Notes (Signed)
Gurney wrist and ankle restraint order placed per verbal MD order. Only wrist restraints applied at this time due to pt being sedated

## 2019-02-25 NOTE — ED Notes (Signed)
[employer] at pt bedside.  

## 2019-02-26 LAB — RAPID URINE DRUG SCREEN, HOSP PERFORMED
Amphetamines: NOT DETECTED
Barbiturates: NOT DETECTED
Benzodiazepines: POSITIVE — AB
Cocaine: POSITIVE — AB
Opiates: NOT DETECTED
Tetrahydrocannabinol: POSITIVE — AB

## 2019-02-26 NOTE — ED Notes (Signed)
Patient verbalizes understanding of discharge instructions. Opportunity for questioning and answers were provided. Armband removed by staff, pt discharged from ED by wheelchair   

## 2019-02-26 NOTE — ED Notes (Signed)
Restraints removed and pt escorted out of facility by [facility]

## 2019-02-26 NOTE — ED Notes (Signed)
In and out performed on pt in order to obtain drug screen
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
## 2019-03-26 ENCOUNTER — Ambulatory Visit: Payer: Medicaid Other | Admitting: Infectious Diseases

## 2019-03-30 ENCOUNTER — Telehealth: Payer: Self-pay | Admitting: *Deleted

## 2019-03-30 NOTE — Telephone Encounter (Signed)
Received call from nurse at [facility] requesting records. Patient is currently incarcerated.  Sent last office note and labs. Andree Coss, RN

## 2019-03-30 NOTE — Telephone Encounter (Signed)
Thank you - Mitch let me know he was brought in last week.

## 2019-04-15 ENCOUNTER — Encounter: Payer: Self-pay | Admitting: Infectious Diseases

## 2019-04-15 ENCOUNTER — Ambulatory Visit (INDEPENDENT_AMBULATORY_CARE_PROVIDER_SITE_OTHER): Admitting: Infectious Diseases

## 2019-04-15 ENCOUNTER — Other Ambulatory Visit: Payer: Self-pay

## 2019-04-15 VITALS — BP 125/82 | HR 78 | Temp 98.0°F

## 2019-04-15 DIAGNOSIS — B20 Human immunodeficiency virus [HIV] disease: Secondary | ICD-10-CM

## 2019-04-15 DIAGNOSIS — B181 Chronic viral hepatitis B without delta-agent: Secondary | ICD-10-CM

## 2019-04-15 MED ORDER — DOLUTEGRAVIR SODIUM 50 MG PO TABS: 50.0000 mg | ORAL_TABLET | Freq: Every day | ORAL | 11 refills | Status: DC

## 2019-04-15 MED ORDER — DARUN-COBIC-EMTRICIT-TENOFAF 800-150-200-10 MG PO TABS: 1.0000 | ORAL_TABLET | Freq: Every day | ORAL | 11 refills | Status: DC
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
## 2019-04-15 NOTE — Assessment & Plan Note (Signed)
I called to touch base with the director of nursing echo for [facility] to clarify how he will have access to his medication what these options are.  Prescription per her recommendation was sent to [facility] so they can coordinate either career or pickup for him.

## 2019-04-16 LAB — T-HELPER CELL (CD4) - (RCID CLINIC ONLY)
CD4 % Helper T Cell: 17 % — ABNORMAL LOW (ref 33–65)
CD4 T Cell Abs: 256 /uL — ABNORMAL LOW (ref 400–1790)

## 2019-04-21 NOTE — Assessment & Plan Note (Signed)
Check LFTs today and monitoring for hepatitis flare being off his medications. We will repeat DNA levels after we get him reestablished for a few months on his medications.  Sooner if it looks like he is having acute hepatitis flare.  No symptoms or findings on exam today consistent with this. Treated with tenofovir alafenamide via Symtuza.

## 2019-04-21 NOTE — Assessment & Plan Note (Addendum)
I reviewed all current and historical labs with Curtis Hodge today.  It took a lot of effort to try and get him to focus and try and take some ownership and the fact that he has been without his medications much longer than 8 days now.  I have had no viral load less than 30,000 since September of last year.  He has had a really difficult time with the passing of his mother which has certainly contributed, however he has poor insight to his current health and no personal accountability for his lack of adherence. I refocused all attention of his other complaints back to his HIV and urgency and getting him back on treatment as soon as possible.  I will contact the director of nursing at the [facility] to discuss how we can best get Curtis Hodge back on his medications.  We will add Tivicay to his Symtuza today to facilitate more rapid decline in viral load.  Hopefully we will be able to maintain on just Symtuza alone thereafter.  I suspect his elevated viral load is not due to more resistance but just poor adherence.  That being said we will still check a genotype today.   Addendum: I spoke with the director of nursing and she was able to verify with me that we can send his visit prescriptions for his HIV medications to any [facility] pharmacy and if needed their staff will pick this up. We can for now continue him on Bactrim 1 double strength daily for presumed low CD4 count.  He has no findings to suggest any opportunistic infections on exam today.  Recommended return in 6-weeks after resuming daily medications to repeat blood work.

## 2019-04-28 ENCOUNTER — Telehealth: Payer: Self-pay | Admitting: *Deleted

## 2019-04-28 DIAGNOSIS — B20 Human immunodeficiency virus [HIV] disease: Secondary | ICD-10-CM

## 2019-04-28 MED ORDER — DARUN-COBIC-EMTRICIT-TENOFAF 800-150-200-10 MG PO TABS: 1.0000 | ORAL_TABLET | Freq: Every day | ORAL | 5 refills | Status: DC

## 2019-04-28 MED ORDER — DOLUTEGRAVIR SODIUM 50 MG PO TABS: 50.0000 mg | ORAL_TABLET | Freq: Every day | ORAL | 5 refills | Status: DC

## 2019-04-28 NOTE — Telephone Encounter (Signed)
Dionne at [facility] medical services calling to confirm patient's medication. He should be on Symtuza + Tivicay.  Will send these to [facility] @ [employer].  Will check with Juliann Pulse for ADAP status.   Please advise if patient still needs to continue bactrim DS based on labs. Landis Gandy, RN

## 2019-04-28 NOTE — Telephone Encounter (Signed)
No he does not need ongoing bactrim - his CD4 was 256 thankfully.   And yes to the [place] please. Thank you for helping organize - I left her a message the day of Macyn's visit so glad to see she returned my call.

## 2019-04-28 NOTE — Telephone Encounter (Signed)
Sent prescriptions to [facility] @ [employer]. Left message for Curtis Hodge letting her know HIV regimen, stop bactrim. Let her know Curtis Hodge was unable to confirm RW/ADAP status with [facility] today, but left them a message.  [facility] should be able to do [employer] per [place].

## 2019-04-28 NOTE — Addendum Note (Signed)
Addended by: Landis Gandy on: 04/28/2019 04:53 PM   Modules accepted: Orders

## 2019-05-02 LAB — HIV-1 RNA ULTRAQUANT REFLEX TO GENTYP+
HIV 1 RNA Quant: 57800 copies/mL — ABNORMAL HIGH
HIV-1 RNA Quant, Log: 4.76 Log copies/mL — ABNORMAL HIGH

## 2019-05-02 LAB — HIV-1 GENOTYPE: HIV-1 Genotype: DETECTED — AB

## 2019-08-27 ENCOUNTER — Telehealth: Payer: Self-pay | Admitting: *Deleted

## 2019-08-27 NOTE — Telephone Encounter (Signed)
Thanks for the heads up.

## 2019-08-27 NOTE — Telephone Encounter (Signed)
Received signed request for records from [facility], [address], [place], [place] [address].  Faxed records as requested. P: [phone] F: [phone] Landis Gandy, RN

## 2019-09-02 ENCOUNTER — Ambulatory Visit: Admitting: Infectious Diseases

## 2019-12-27 ENCOUNTER — Encounter: Payer: Self-pay | Admitting: Infectious Diseases

## 2019-12-27 NOTE — Progress Notes (Signed)
Patient ID: Curtis Hodge, male   DOB: 1966-10-10, 54 y.o.   MRN: [id number] Working Viral Load List  Called [facility] at [phone] was advised pt was transferred to [facility] at [phone] spoke with Ms Sharol Given who advised pt is still there and is getting his medication

## 2020-01-30 IMAGING — CT CT ABD-PELV W/ CM
2 of 5 series · 15 of 46 positions shown, 17 images · IV contrast (omnipaque)
Comparison: CT chest 02/23/2017

CLINICAL DATA: Abdominal pain for 1 week.  Shortness of breath.

EXAM:
CT ABDOMEN AND PELVIS WITH CONTRAST
TECHNIQUE: Multidetector CT imaging of the abdomen and pelvis was performed
using the standard protocol following bolus administration of
intravenous contrast.
CONTRAST:  100mL OMNIPAQUE IOHEXOL 300 MG/ML  SOLN

[Series 3: abdomen 5.0 · axial · 0.70mm/px · z∈[+838,+1193]mm · 12 of 85 slices shown, 14 images]
[im 7/85  soft-tissue]
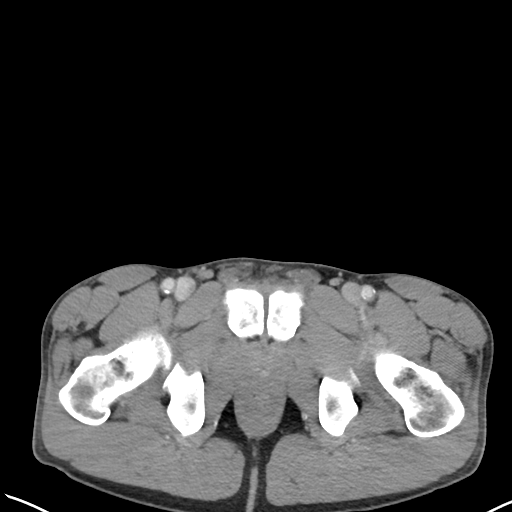
[im 7/85  bone]
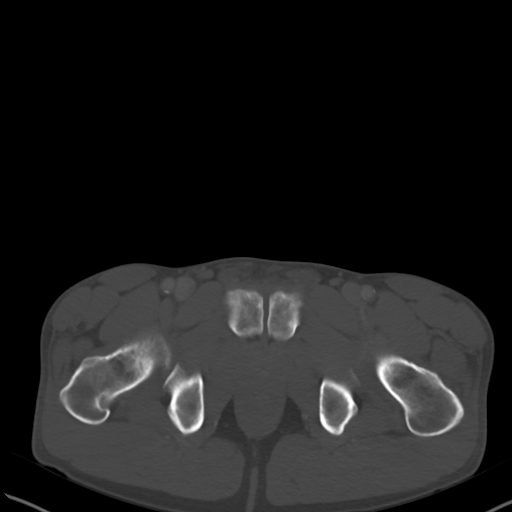
[im 13/85  soft-tissue]
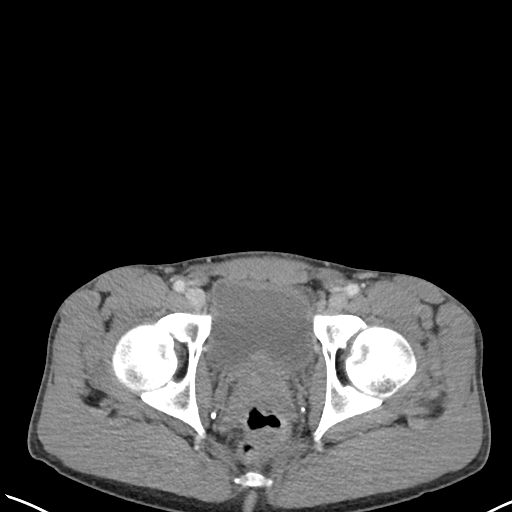
[im 20/85  soft-tissue]
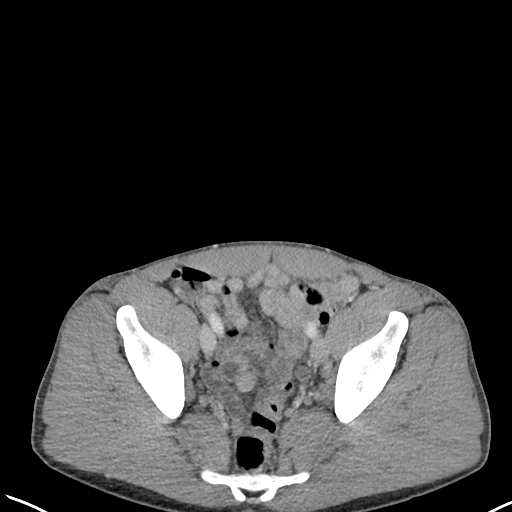
[im 26/85  soft-tissue]
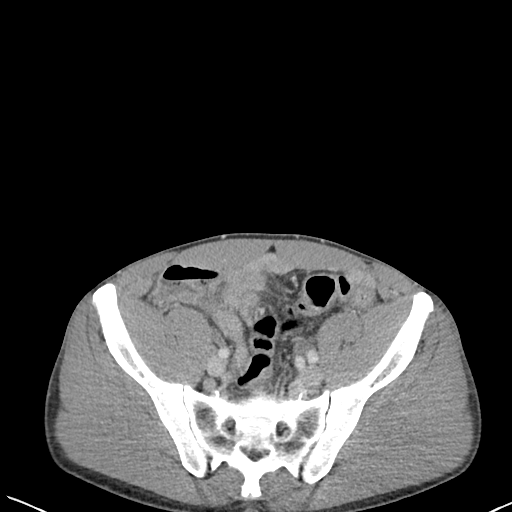
[im 33/85  soft-tissue]
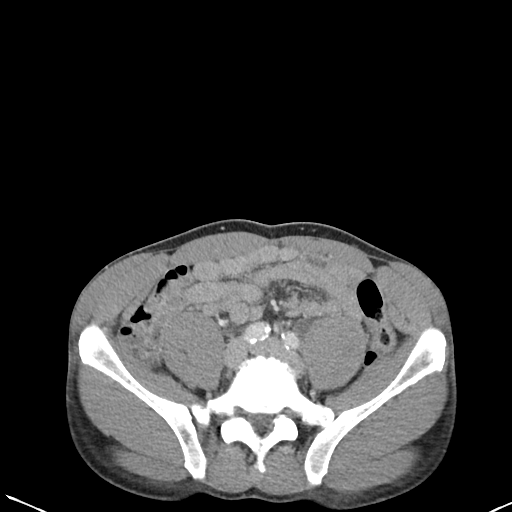
[im 39/85  soft-tissue]
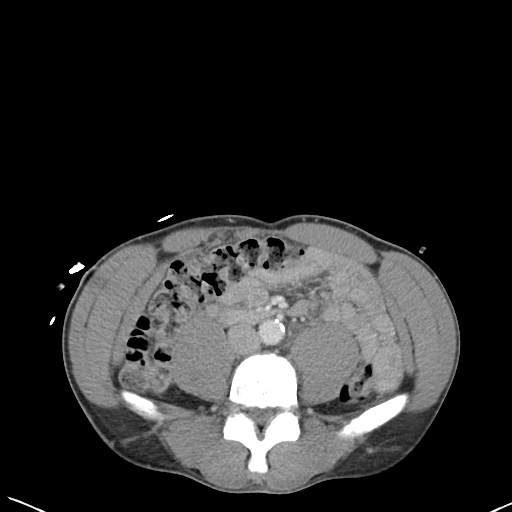
[im 46/85  soft-tissue]
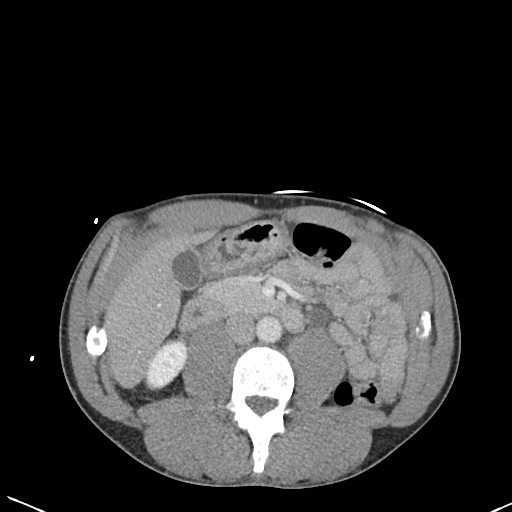
[im 52/85  soft-tissue]
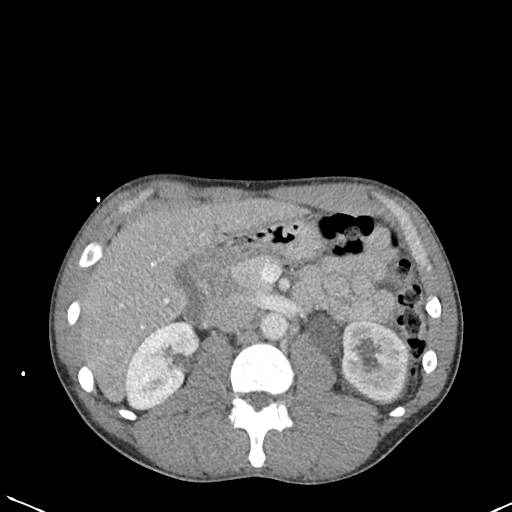
[im 59/85  soft-tissue]
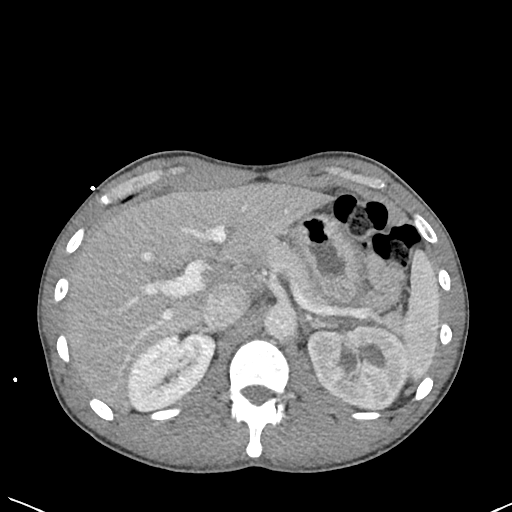
[im 59/85  bone]
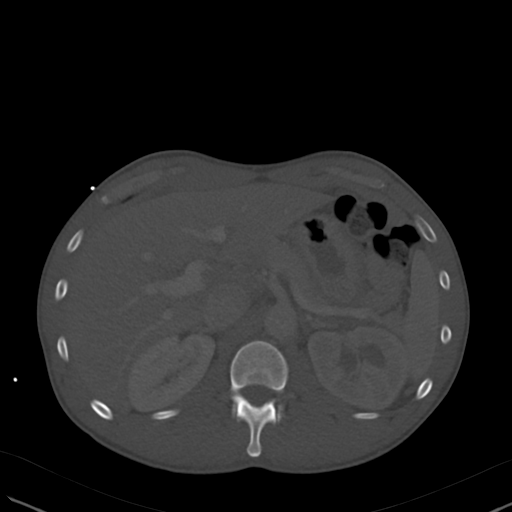
[im 65/85  soft-tissue]
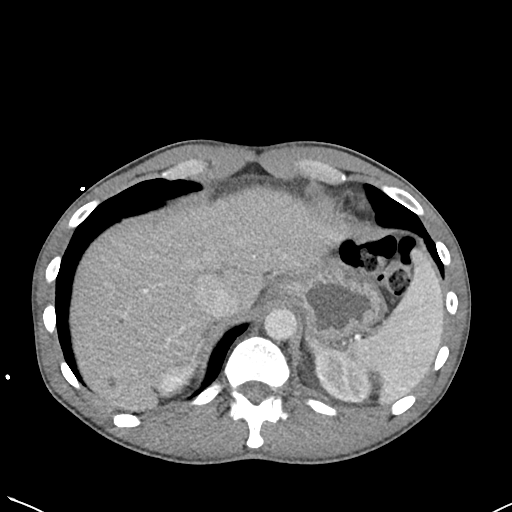
[im 72/85  soft-tissue]
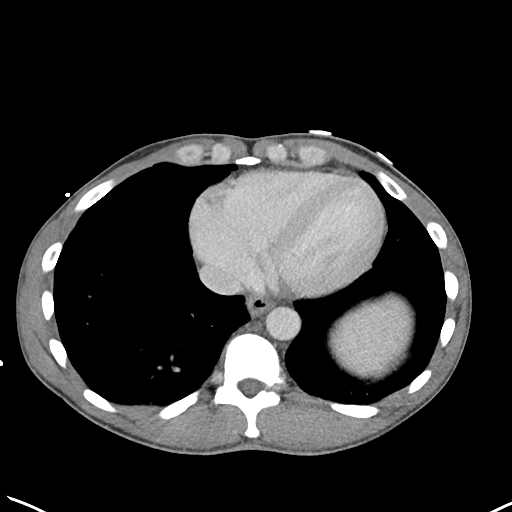
[im 78/85  soft-tissue]
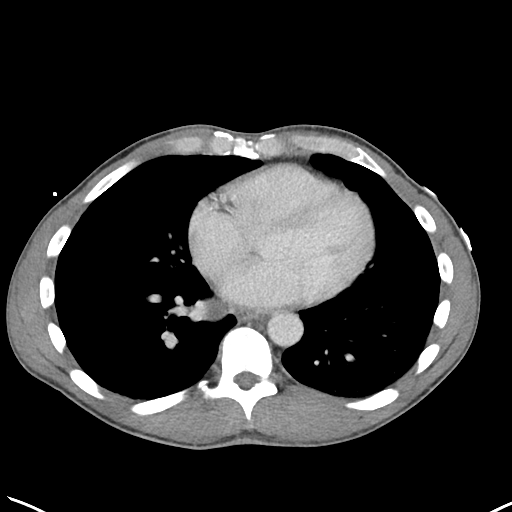

[Series 6: abdomen 3.0 mpr cor · coronal · 0.67mm/px · 3 of 83 slices shown]
[im 28/83  soft-tissue]
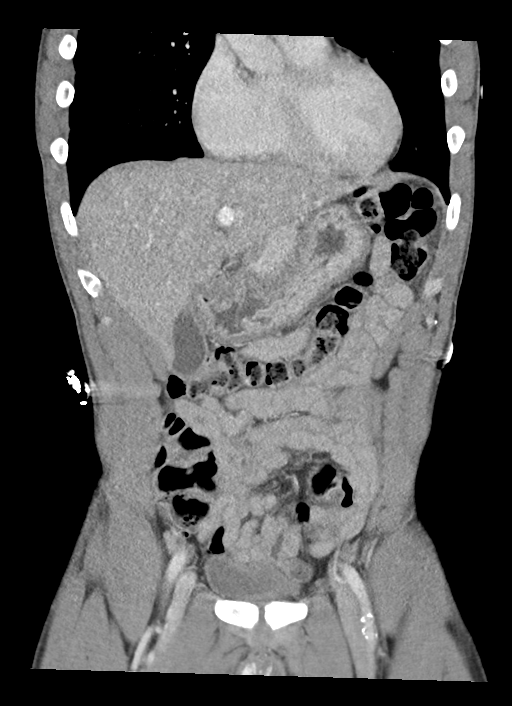
[im 37/83  soft-tissue]
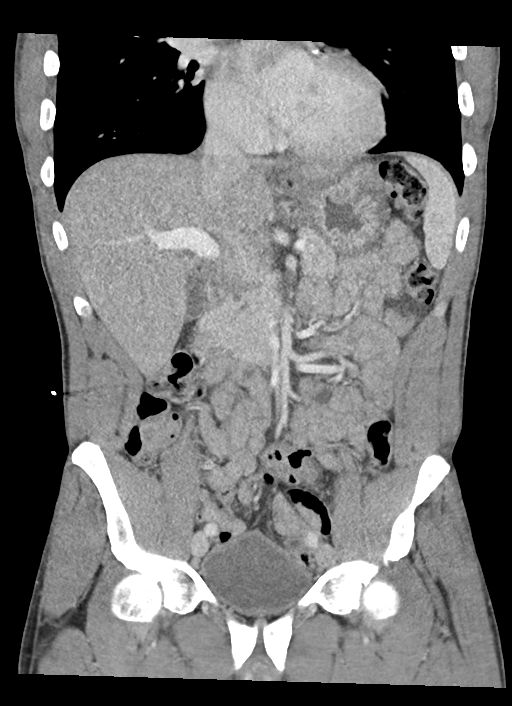
[im 46/83  soft-tissue]
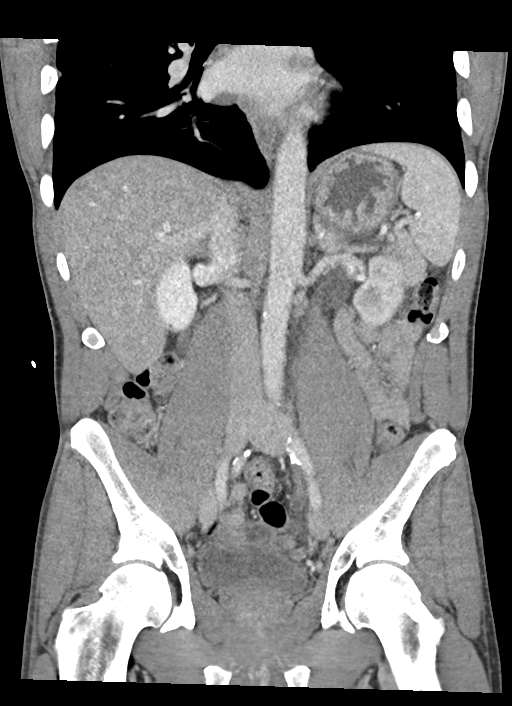

[15 of 46 positions shown; findings below may reference images not displayed]

FINDINGS: Lower chest: Bilateral bullous disease again noted. No pneumothorax
at the lung bases. No pleural effusion. Normal heart size. Coronary
artery atherosclerosis in the LAD.

Hepatobiliary: No focal liver abnormality is seen. No gallstones,
gallbladder wall thickening, or biliary dilatation.

Pancreas: Unremarkable. No pancreatic ductal dilatation or
surrounding inflammatory changes.

Spleen: Normal in size without focal abnormality.

Adrenals/Urinary Tract: Normal adrenal glands. Normal right kidney.
8 mm left UPJ calculus resulting in severe left
hydroureteronephrosis. Normal bladder.

Stomach/Bowel: Stomach is within normal limits. Appendix appears
normal. No evidence of bowel wall thickening, distention, or
inflammatory changes.

Vascular/Lymphatic: Normal caliber abdominal aorta with
atherosclerosis. No lymphadenopathy.

Reproductive: Prostate is unremarkable.

Other: No abdominal wall hernia or abnormality. No abdominopelvic
ascites.

Musculoskeletal: No acute osseous abnormality. No aggressive osseous
lesion. Degenerative disc disease with disc height loss at L4-5.
IMPRESSION: 1. 8 mm left UPJ calculus resulting in severe left
hydroureteronephrosis.
2.  Aortic Atherosclerosis ([id number]).

## 2021-08-28 ENCOUNTER — Ambulatory Visit: Payer: Medicaid Other | Admitting: Family

## 2021-12-18 ENCOUNTER — Telehealth: Payer: Self-pay

## 2021-12-18 NOTE — Telephone Encounter (Signed)
Patient last seen 03/2019 - called to offer appointment, no answer and unable to leave voicemail.  Beryle Flock, RN

## 2022-01-14 ENCOUNTER — Encounter (HOSPITAL_COMMUNITY): Payer: Self-pay | Admitting: Emergency Medicine

## 2022-01-14 ENCOUNTER — Emergency Department (HOSPITAL_COMMUNITY)
Admission: EM | Admit: 2022-01-14 | Discharge: 2022-01-14 | Disposition: A | Discharge status: Home / Self Care | Payer: Medicaid Other | Attending: Emergency Medicine | Admitting: Emergency Medicine

## 2022-01-14 ENCOUNTER — Emergency Department (HOSPITAL_COMMUNITY): Payer: Medicaid Other

## 2022-01-14 ENCOUNTER — Telehealth: Payer: Self-pay | Admitting: Infectious Diseases

## 2022-01-14 ENCOUNTER — Observation Stay (HOSPITAL_BASED_OUTPATIENT_CLINIC_OR_DEPARTMENT_OTHER): Payer: Medicaid Other

## 2022-01-14 ENCOUNTER — Inpatient Hospital Stay (HOSPITAL_COMMUNITY)
Admission: EM | Admit: 2022-01-14 | Discharge: 2022-01-16 | DRG: 303 | Disposition: A | Discharge status: Home Health | Payer: Medicaid Other | Attending: Internal Medicine | Admitting: Internal Medicine

## 2022-01-14 ENCOUNTER — Observation Stay (HOSPITAL_COMMUNITY): Payer: Medicaid Other

## 2022-01-14 ENCOUNTER — Other Ambulatory Visit: Payer: Self-pay

## 2022-01-14 DIAGNOSIS — F17200 Nicotine dependence, unspecified, uncomplicated: Secondary | ICD-10-CM | POA: Insufficient documentation

## 2022-01-14 DIAGNOSIS — Z9114 Patient's other noncompliance with medication regimen: Secondary | ICD-10-CM

## 2022-01-14 DIAGNOSIS — Z5900 Homelessness unspecified: Secondary | ICD-10-CM

## 2022-01-14 DIAGNOSIS — Z21 Asymptomatic human immunodeficiency virus [HIV] infection status: Secondary | ICD-10-CM | POA: Diagnosis not present

## 2022-01-14 DIAGNOSIS — R52 Pain, unspecified: Secondary | ICD-10-CM

## 2022-01-14 DIAGNOSIS — I2510 Atherosclerotic heart disease of native coronary artery without angina pectoris: Secondary | ICD-10-CM

## 2022-01-14 DIAGNOSIS — D72819 Decreased white blood cell count, unspecified: Secondary | ICD-10-CM | POA: Diagnosis present

## 2022-01-14 DIAGNOSIS — R55 Syncope and collapse: Secondary | ICD-10-CM

## 2022-01-14 DIAGNOSIS — F1410 Cocaine abuse, uncomplicated: Secondary | ICD-10-CM | POA: Diagnosis present

## 2022-01-14 DIAGNOSIS — M79671 Pain in right foot: Secondary | ICD-10-CM | POA: Diagnosis present

## 2022-01-14 DIAGNOSIS — R0789 Other chest pain: Secondary | ICD-10-CM

## 2022-01-14 DIAGNOSIS — Z20822 Contact with and (suspected) exposure to covid-19: Secondary | ICD-10-CM | POA: Diagnosis not present

## 2022-01-14 DIAGNOSIS — R079 Chest pain, unspecified: Secondary | ICD-10-CM

## 2022-01-14 DIAGNOSIS — F209 Schizophrenia, unspecified: Secondary | ICD-10-CM | POA: Diagnosis present

## 2022-01-14 DIAGNOSIS — I25111 Atherosclerotic heart disease of native coronary artery with angina pectoris with documented spasm: Principal | ICD-10-CM | POA: Diagnosis present

## 2022-01-14 DIAGNOSIS — Z79899 Other long term (current) drug therapy: Secondary | ICD-10-CM

## 2022-01-14 DIAGNOSIS — R0602 Shortness of breath: Secondary | ICD-10-CM

## 2022-01-14 DIAGNOSIS — T424X5A Adverse effect of benzodiazepines, initial encounter: Secondary | ICD-10-CM | POA: Diagnosis not present

## 2022-01-14 DIAGNOSIS — W19XXXA Unspecified fall, initial encounter: Secondary | ICD-10-CM | POA: Diagnosis present

## 2022-01-14 DIAGNOSIS — M109 Gout, unspecified: Secondary | ICD-10-CM | POA: Diagnosis present

## 2022-01-14 DIAGNOSIS — F329 Major depressive disorder, single episode, unspecified: Secondary | ICD-10-CM | POA: Diagnosis present

## 2022-01-14 DIAGNOSIS — J439 Emphysema, unspecified: Secondary | ICD-10-CM | POA: Diagnosis present

## 2022-01-14 DIAGNOSIS — F17210 Nicotine dependence, cigarettes, uncomplicated: Secondary | ICD-10-CM | POA: Diagnosis present

## 2022-01-14 DIAGNOSIS — Z89431 Acquired absence of right foot: Secondary | ICD-10-CM | POA: Diagnosis present

## 2022-01-14 DIAGNOSIS — Z91199 Patient's noncompliance with other medical treatment and regimen due to unspecified reason: Secondary | ICD-10-CM

## 2022-01-14 DIAGNOSIS — B20 Human immunodeficiency virus [HIV] disease: Secondary | ICD-10-CM

## 2022-01-14 DIAGNOSIS — F419 Anxiety disorder, unspecified: Secondary | ICD-10-CM | POA: Diagnosis present

## 2022-01-14 DIAGNOSIS — B181 Chronic viral hepatitis B without delta-agent: Secondary | ICD-10-CM | POA: Diagnosis present

## 2022-01-14 DIAGNOSIS — R400 Somnolence: Secondary | ICD-10-CM | POA: Diagnosis not present

## 2022-01-14 DIAGNOSIS — Z89421 Acquired absence of other right toe(s): Secondary | ICD-10-CM

## 2022-01-14 DIAGNOSIS — F1910 Other psychoactive substance abuse, uncomplicated: Secondary | ICD-10-CM | POA: Diagnosis present

## 2022-01-14 LAB — BASIC METABOLIC PANEL
Anion gap: 8 (ref 5–15)
Anion gap: 7 (ref 5–15)
BUN: 7 mg/dL (ref 6–20)
BUN: 6 mg/dL (ref 6–20)
CO2: 25 mmol/L (ref 22–32)
CO2: 26 mmol/L (ref 22–32)
Calcium: 9 mg/dL (ref 8.9–10.3)
Calcium: 8.7 mg/dL — ABNORMAL LOW (ref 8.9–10.3)
Chloride: 103 mmol/L (ref 98–111)
Chloride: 108 mmol/L (ref 98–111)
Creatinine, Ser: 0.89 mg/dL (ref 0.61–1.24)
Creatinine, Ser: 0.85 mg/dL (ref 0.61–1.24)
GFR, Estimated: >60 mL/min (ref >60)
GFR, Estimated: >60 mL/min (ref >60)
Glucose, Bld: 92 mg/dL (ref 70–99)
Glucose, Bld: 108 mg/dL — ABNORMAL HIGH (ref 70–99)
Potassium: 3.7 mmol/L (ref 3.5–5.1)
Potassium: 3.6 mmol/L (ref 3.5–5.1)
Sodium: 136 mmol/L (ref 135–145)
Sodium: 141 mmol/L (ref 135–145)

## 2022-01-14 LAB — URINALYSIS, ROUTINE W REFLEX MICROSCOPIC
Bacteria, UA: NONE SEEN
Bilirubin Urine: NEGATIVE
Glucose, UA: NEGATIVE mg/dL
Hgb urine dipstick: NEGATIVE
Ketones, ur: NEGATIVE mg/dL
Nitrite: NEGATIVE
Protein, ur: NEGATIVE mg/dL
Specific Gravity, Urine: 1.021 (ref 1.005–1.030)
pH: 6 (ref 5.0–8.0)

## 2022-01-14 LAB — ECHOCARDIOGRAM COMPLETE
AR max vel: 2.56 cm2
AV Area VTI: 3.02 cm2
AV Area mean vel: 2.59 cm2
AV Mean grad: 3 mmHg
AV Peak grad: 6.2 mmHg
Ao pk vel: 1.24 m/s
Area-P 1/2: 3.06 cm2
Calc EF: 62.9 %
S' Lateral: 3.5 cm
Single Plane A2C EF: 55.8 %
Single Plane A4C EF: 57.3 %

## 2022-01-14 LAB — CBC
HCT: 41.5 % (ref 39.0–52.0)
HCT: 40.5 % (ref 39.0–52.0)
Hemoglobin: 13.7 g/dL (ref 13.0–17.0)
Hemoglobin: 13.6 g/dL (ref 13.0–17.0)
MCH: 31.4 pg (ref 26.0–34.0)
MCH: 32.1 pg (ref 26.0–34.0)
MCHC: 33 g/dL (ref 30.0–36.0)
MCHC: 33.6 g/dL (ref 30.0–36.0)
MCV: 95 fL (ref 80.0–100.0)
MCV: 95.5 fL (ref 80.0–100.0)
Platelets: 172 10*3/uL (ref 150–400)
Platelets: 154 10*3/uL (ref 150–400)
RBC: 4.37 MIL/uL (ref 4.22–5.81)
RBC: 4.24 MIL/uL (ref 4.22–5.81)
RDW: 12.2 % (ref 11.5–15.5)
RDW: 12.4 % (ref 11.5–15.5)
WBC: 4.2 10*3/uL (ref 4.0–10.5)
WBC: 2.8 10*3/uL — ABNORMAL LOW (ref 4.0–10.5)
nRBC: 0 % (ref 0.0–0.2)
nRBC: 0 % (ref 0.0–0.2)

## 2022-01-14 LAB — RESP PANEL BY RT-PCR (FLU A&B, COVID) ARPGX2
Influenza A by PCR: NEGATIVE
Influenza A by PCR: NEGATIVE
Influenza B by PCR: NEGATIVE
Influenza B by PCR: NEGATIVE
SARS Coronavirus 2 by RT PCR: NEGATIVE
SARS Coronavirus 2 by RT PCR: NEGATIVE

## 2022-01-14 LAB — LIPID PANEL
Cholesterol: 127 mg/dL (ref 0–200)
HDL: 30 mg/dL — ABNORMAL LOW (ref >40)
LDL Cholesterol: 90 mg/dL (ref 0–99)
Total CHOL/HDL Ratio: 4.2 RATIO
Triglycerides: 34 mg/dL (ref <150)
VLDL: 7 mg/dL (ref 0–40)

## 2022-01-14 LAB — TROPONIN I (HIGH SENSITIVITY)
Troponin I (High Sensitivity): 13 ng/L (ref <18)
Troponin I (High Sensitivity): 11 ng/L (ref <18)
Troponin I (High Sensitivity): 11 ng/L (ref <18)
Troponin I (High Sensitivity): 10 ng/L (ref <18)

## 2022-01-14 LAB — RAPID URINE DRUG SCREEN, HOSP PERFORMED
Amphetamines: NOT DETECTED
Barbiturates: NOT DETECTED
Benzodiazepines: NOT DETECTED
Cocaine: POSITIVE — AB
Opiates: NOT DETECTED
Tetrahydrocannabinol: POSITIVE — AB

## 2022-01-14 LAB — ETHANOL: Alcohol, Ethyl (B): <10 mg/dL (ref <10)

## 2022-01-14 LAB — D-DIMER, QUANTITATIVE: D-Dimer, Quant: 0.31 ug/mL-FEU (ref 0.00–0.50)

## 2022-01-14 LAB — CBG MONITORING, ED: Glucose-Capillary: 106 mg/dL — ABNORMAL HIGH (ref 70–99)

## 2022-01-14 MED ORDER — NICOTINE 21 MG/24HR TD PT24
21.0000 mg | MEDICATED_PATCH | Freq: Every day | TRANSDERMAL | Status: DC
Start: 2022-01-14 — End: 2022-01-16
  Administered 2022-01-14 – 2022-01-16 (×3): 21 mg via TRANSDERMAL
  Filled 2022-01-14 (×3): qty 1

## 2022-01-14 MED ORDER — DOXYCYCLINE HYCLATE 100 MG PO TABS: 100.0000 mg | ORAL_TABLET | Freq: Two times a day (BID) | ORAL | Status: DC

## 2022-01-14 MED ORDER — ACETAMINOPHEN 325 MG PO TABS: 650.0000 mg | ORAL_TABLET | ORAL | Status: DC | PRN

## 2022-01-14 MED ORDER — ACETAMINOPHEN 500 MG PO TABS
1000.0000 mg | ORAL_TABLET | Freq: Once | ORAL | Status: AC
  Administered 2022-01-14: 1000 mg via ORAL
  Filled 2022-01-14: qty 2

## 2022-01-14 MED ORDER — MIRTAZAPINE 15 MG PO TABS
15.0000 mg | ORAL_TABLET | Freq: Every day | ORAL | Status: DC
  Administered 2022-01-14 – 2022-01-15 (×2): 15 mg via ORAL
  Filled 2022-01-14 (×4): qty 1

## 2022-01-14 MED ORDER — OLANZAPINE 10 MG PO TABS
10.0000 mg | ORAL_TABLET | Freq: Every day | ORAL | Status: DC
  Administered 2022-01-14 – 2022-01-15 (×2): 10 mg via ORAL
  Filled 2022-01-14 (×4): qty 1

## 2022-01-14 MED ORDER — DARUN-COBIC-EMTRICIT-TENOFAF 800-150-200-10 MG PO TABS
1.0000 | ORAL_TABLET | Freq: Every day | ORAL | Status: DC
  Administered 2022-01-15 – 2022-01-16 (×2): 1 via ORAL
  Filled 2022-01-14 (×2): qty 1

## 2022-01-14 MED ORDER — HYDROXYZINE HCL 25 MG PO TABS: 25.0000 mg | ORAL_TABLET | Freq: Three times a day (TID) | ORAL | Status: DC | PRN

## 2022-01-14 MED ORDER — SERTRALINE HCL 50 MG PO TABS
50.0000 mg | ORAL_TABLET | Freq: Every day | ORAL | Status: DC
  Administered 2022-01-14 – 2022-01-16 (×3): 50 mg via ORAL
  Filled 2022-01-14 (×3): qty 1

## 2022-01-14 MED ORDER — LORAZEPAM 1 MG PO TABS
1.0000 mg | ORAL_TABLET | Freq: Two times a day (BID) | ORAL | Status: AC
  Administered 2022-01-14 (×2): 1 mg via ORAL
  Filled 2022-01-14 (×2): qty 1

## 2022-01-14 MED ORDER — ATORVASTATIN CALCIUM 40 MG PO TABS
40.0000 mg | ORAL_TABLET | Freq: Every day | ORAL | Status: DC
  Administered 2022-01-14 – 2022-01-16 (×3): 40 mg via ORAL
  Filled 2022-01-14 (×3): qty 1

## 2022-01-14 MED ORDER — CARVEDILOL 3.125 MG PO TABS
3.1250 mg | ORAL_TABLET | Freq: Two times a day (BID) | ORAL | Status: DC
  Administered 2022-01-14 – 2022-01-16 (×4): 3.125 mg via ORAL
  Filled 2022-01-14 (×4): qty 1

## 2022-01-14 MED ORDER — IPRATROPIUM-ALBUTEROL 0.5-2.5 (3) MG/3ML IN SOLN
3.0000 mL | Freq: Once | RESPIRATORY_TRACT | Status: AC
Start: 2022-01-14 — End: 2022-01-14
  Administered 2022-01-14: 3 mL via RESPIRATORY_TRACT
  Filled 2022-01-14: qty 3

## 2022-01-14 MED ORDER — DOLUTEGRAVIR SODIUM 50 MG PO TABS
50.0000 mg | ORAL_TABLET | Freq: Every day | ORAL | Status: DC
  Administered 2022-01-14 – 2022-01-16 (×3): 50 mg via ORAL
  Filled 2022-01-14 (×3): qty 1

## 2022-01-14 MED ORDER — ENOXAPARIN SODIUM 40 MG/0.4ML IJ SOSY
40.0000 mg | PREFILLED_SYRINGE | INTRAMUSCULAR | Status: DC
  Administered 2022-01-14 – 2022-01-15 (×2): 40 mg via SUBCUTANEOUS
  Filled 2022-01-14 (×2): qty 0.4

## 2022-01-14 MED ORDER — MOMETASONE FURO-FORMOTEROL FUM 100-5 MCG/ACT IN AERO
2.0000 | INHALATION_SPRAY | Freq: Two times a day (BID) | RESPIRATORY_TRACT | Status: DC
  Administered 2022-01-15 – 2022-01-16 (×3): 2 via RESPIRATORY_TRACT
  Filled 2022-01-14: qty 8.8

## 2022-01-14 MED ORDER — ASPIRIN EC 81 MG PO TBEC
81.0000 mg | DELAYED_RELEASE_TABLET | Freq: Every day | ORAL | Status: DC
Start: 2022-01-14 — End: 2022-01-16
  Administered 2022-01-14 – 2022-01-16 (×3): 81 mg via ORAL
  Filled 2022-01-14 (×3): qty 1

## 2022-01-14 MED ORDER — ONDANSETRON HCL 4 MG/2ML IJ SOLN: 4.0000 mg | Freq: Four times a day (QID) | INTRAMUSCULAR | Status: DC | PRN

## 2022-01-14 MED ORDER — KETOROLAC TROMETHAMINE 60 MG/2ML IM SOLN
30.0000 mg | Freq: Once | INTRAMUSCULAR | Status: AC
  Administered 2022-01-14: 30 mg via INTRAMUSCULAR
  Filled 2022-01-14: qty 2

## 2022-01-14 MED ORDER — GABAPENTIN 300 MG PO CAPS
300.0000 mg | ORAL_CAPSULE | Freq: Three times a day (TID) | ORAL | Status: DC
  Administered 2022-01-14 – 2022-01-16 (×5): 300 mg via ORAL
  Filled 2022-01-14 (×5): qty 1

## 2022-01-14 MED ORDER — ALBUTEROL SULFATE HFA 108 (90 BASE) MCG/ACT IN AERS
2.0000 | INHALATION_SPRAY | RESPIRATORY_TRACT | Status: DC | PRN
  Administered 2022-01-14: 2 via RESPIRATORY_TRACT
  Filled 2022-01-14: qty 6.7

## 2022-01-14 NOTE — ED Triage Notes (Signed)
Patient BIB [facility] from [facility], patient was walking from car, collapsed, altered upon EMS arrival, A&Ox3. VSS.
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
## 2022-01-14 NOTE — Progress Notes (Signed)
° °  Echocardiogram 2D Echocardiogram has been performed.  Festus Barren 01/14/2022, 3:22 PM
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
## 2022-01-14 NOTE — ED Notes (Signed)
Patient transported to X-ray 
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
## 2022-01-14 NOTE — Discharge Instructions (Signed)
For pain control you may take at 1000 mg of Tylenol every 8 hours as needed. 

## 2022-01-15 DIAGNOSIS — F17200 Nicotine dependence, unspecified, uncomplicated: Secondary | ICD-10-CM

## 2022-01-15 DIAGNOSIS — J439 Emphysema, unspecified: Secondary | ICD-10-CM | POA: Diagnosis not present

## 2022-01-15 DIAGNOSIS — B20 Human immunodeficiency virus [HIV] disease: Secondary | ICD-10-CM | POA: Diagnosis not present

## 2022-01-15 DIAGNOSIS — F1910 Other psychoactive substance abuse, uncomplicated: Secondary | ICD-10-CM

## 2022-01-15 DIAGNOSIS — Z5900 Homelessness unspecified: Secondary | ICD-10-CM

## 2022-01-15 DIAGNOSIS — Z89431 Acquired absence of right foot: Secondary | ICD-10-CM | POA: Diagnosis not present

## 2022-01-15 DIAGNOSIS — D72819 Decreased white blood cell count, unspecified: Secondary | ICD-10-CM

## 2022-01-15 DIAGNOSIS — R55 Syncope and collapse: Secondary | ICD-10-CM

## 2022-01-15 LAB — T-HELPER CELLS (CD4) COUNT (NOT AT ARMC)
CD4 % Helper T Cell: 20 % — ABNORMAL LOW (ref 33–65)
CD4 T Cell Abs: 251 /uL — ABNORMAL LOW (ref 400–1790)

## 2022-01-15 NOTE — Assessment & Plan Note (Addendum)
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 Harrell Gave who reviewed the patient's CT scan which showed three-vessel sclerosis.  Given that he had negative troponins x2 cardiology recommended echocardiogram -Echocardiogram done and showed an EF of 55 to 60% with a normal left ventricular function.  There is no regional wall motion abnormalities and left ventricular diastolic parameters were consistent with grade 1 diastolic dysfunction.  Patient also had right ventricular systolic function that was normal and not mitral valve is normal structure as well as a aortic valve that was tricuspid with no aortic valve regurgitation visualized and no aortic stenosis present. -Patient was given lorazepam 1 mg p.o. twice daily x3 doses which led to somnolence.  -Lorazepam discontinued with resolution of somnolence. -He was initiated on carvedilol 3.125 mg p.o. twice daily as well as ASA 81 mg p.o. daily and atorvastatin 40 mg p.o. daily -Cardiology recommended outpatient follow-up for stress test -tobacco cessation counseling given and patient had a lipid panel done for risk stratification. -Patient had no further chest pain during the hospitalization and chest pain had resolved by day of discharge. -Outpatient follow-up with cardiology for further evaluation and outpatient stress test.

## 2022-01-15 NOTE — Assessment & Plan Note (Addendum)
-  Patient was positive for cocaine and THC on his UDS -Counseling was given and for some reason he started on Ativan and carvedilol -TOC consulted.

## 2022-01-15 NOTE — Assessment & Plan Note (Addendum)
Hx of Schizophrenia -During the hospitalization patient maintained on SSRI with sertraline 50 mg daily as well as continued on olanzapine 10 mg nightly.   -Patient noted to have not been compliant in the outpatient setting and will need outpatient follow-up for further management.  -Patient will be discharged on sertraline.

## 2022-01-15 NOTE — Assessment & Plan Note (Addendum)
-   Likely related to coronary artery spasm and 2D echo obtained with a EF of 55 to 60%, NWMA, no valvular stenosis noted. -Orthostatics were checked and patient was not orthostatic. -CT chest done was negative for pneumothorax, severe bullous disease right greater than left in the upper lobes, paraseptal emphysematous change in the lower and middle lobes noted similar findings to 2018, aortic and coronary artery atherosclerosis. -Patient gently hydrated with IV fluids. -Patient improved clinically and no further syncopal episodes noted. -Outpatient follow-up with PCP.

## 2022-01-15 NOTE — Assessment & Plan Note (Addendum)
-   His CD4 and an T-helper cell count was obtained was extremely low likely given his noncompliance; CD4 count was 251 -Patient maintained on antivirals.   Darunavir-Cobistat-Emtricitabine-Tenofovir 800-150-200-10 1 tab po Daily and continue with Dolutegravir 50 mg po Daily -Patient will follow-up with ID in the outpatient setting, appointment arranged prior to discharge.

## 2022-01-15 NOTE — Assessment & Plan Note (Addendum)
-   With complaining of right foot pain and is status post right foot transmetatarsal amputation 3 years ago -Not wearing specialized shoes and now has a huge callus on his fourth and fifth metatarsal stump. -Offloading shoes recommended and outpatient podiatry follow-up for specialized shoes -Patient seen in consultation by Dr. Allena Katz with podiatry on 01/16/2022 and callus debrided down with a chisel blade per podiatry with no ulceration noted, no underlying skin broken, no signs of infection noted. -Podiatry recommended weightbearing as tolerated in regular shoes, outpatient follow-up with podiatry to be fitted for prosthesis. -Patient will be discharged in stable condition.

## 2022-01-15 NOTE — Assessment & Plan Note (Addendum)
-   TOC consulted. -Patient was discharged home with his sister who he will be residing with per TOC.

## 2022-01-15 NOTE — Plan of Care (Signed)

## 2022-01-15 NOTE — Evaluation (Signed)
Physical Therapy Evaluation Patient Details Name: Curtis Hodge MRN: [id number] DOB: 15-Feb-1966 Today's Date: 01/15/2022  History of Present Illness  Pt is a 56 y/o male admitted secondary to R foot pain and syncope. Pt with increased chest pain as well. PMH includes COPD, HTN, and hep B.  Clinical Impression  Pt admitted secondary to problem above with deficits below. Pt requiring min guard A to stand this session. Once standing pt with eyes closed and decreased responsiveness. Assisted pt back into seated position and pt began talking again. BP below. Anticipate pt will progress well as symptom improves. Would benefit from cane to help with pain management. Reports he plans to stay with his sister at d/c. Will continue to follow acutely.    01/15/22 1036  Orthostatic Lying   BP- Lying 134/90  Pulse- Lying 88  Orthostatic Sitting  BP- Sitting (!) 127/107  Pulse- Sitting 84  Orthostatic Standing at 0 minutes  BP- Standing at 0 minutes 119/74         Recommendations for follow up therapy are one component of a multi-disciplinary discharge planning process, led by the attending physician.  Recommendations may be updated based on patient status, additional functional criteria and insurance authorization.  Follow Up Recommendations Home health PT    Assistance Recommended at Discharge Intermittent Supervision/Assistance  Patient can return home with the following  Help with stairs or ramp for entrance;Assistance with cooking/housework    Equipment Recommendations Cane  Recommendations for Other Services       Functional Status Assessment Patient has had a recent decline in their functional status and demonstrates the ability to make significant improvements in function in a reasonable and predictable amount of time.     Precautions / Restrictions Precautions Precautions: Fall Precaution Comments: watch BP Restrictions Weight Bearing Restrictions: No      Mobility  Bed  Mobility Overal bed mobility: Modified Independent                  Transfers Overall transfer level: Needs assistance Equipment used: None Transfers: Sit to/from Stand Sit to Stand: Min guard           General transfer comment: Min guard for safety. Upon standing. Pt with eyes closed and with decreased responsiveness so had to assist pt into sitting. Pt began talking upon return to sitting.    Ambulation/Gait                  Stairs            Wheelchair Mobility    Modified Rankin (Stroke Patients Only)       Balance Overall balance assessment: Needs assistance Sitting-balance support: No upper extremity supported, Feet supported Sitting balance-Leahy Scale: Good     Standing balance support: No upper extremity supported Standing balance-Leahy Scale: Fair                               Pertinent Vitals/Pain Pain Assessment Pain Assessment: Faces Faces Pain Scale: Hurts little more Pain Location: R foot, chest Pain Descriptors / Indicators: Grimacing, Guarding, Pressure Pain Intervention(s): Limited activity within patient's tolerance, Monitored during session, Repositioned    Home Living Family/patient expects to be discharged to:: Unsure Living Arrangements: Other relatives (sister) Available Help at Discharge: Family Type of Home: House Home Access: Level entry       Home Layout: Two level;Able to live on main level with bedroom/bathroom Home Equipment: None  Additional Comments: Pt reports he was homeless but plans to stay with his sister    Prior Function Prior Level of Function : Independent/Modified Independent                     Hand Dominance        Extremity/Trunk Assessment   Upper Extremity Assessment Upper Extremity Assessment: Defer to OT evaluation    Lower Extremity Assessment Lower Extremity Assessment: RLE deficits/detail RLE Deficits / Details: R transmet at baseline. Tender to  touch    Cervical / Trunk Assessment Cervical / Trunk Assessment: Normal  Communication   Communication: No difficulties  Cognition Arousal/Alertness: Awake/alert Behavior During Therapy: WFL for tasks assessed/performed Overall Cognitive Status: Within Functional Limits for tasks assessed                                          General Comments General comments (skin integrity, edema, etc.): Did note drop in BP; see vitals flowsheet    Exercises     Assessment/Plan    PT Assessment Patient needs continued PT services  PT Problem List Decreased activity tolerance;Decreased balance;Decreased mobility;Pain       PT Treatment Interventions DME instruction;Gait training;Functional mobility training;Therapeutic activities;Therapeutic exercise;Balance training;Patient/family education    PT Goals (Current goals can be found in the Care Plan section)  Acute Rehab PT Goals Patient Stated Goal: to decrease pain PT Goal Formulation: With patient Time For Goal Achievement: 01/29/22 Potential to Achieve Goals: Good    Frequency Min 3X/week     Co-evaluation               AM-PAC PT "6 Clicks" Mobility  Outcome Measure Help needed turning from your back to your side while in a flat bed without using bedrails?: None Help needed moving from lying on your back to sitting on the side of a flat bed without using bedrails?: None Help needed moving to and from a bed to a chair (including a wheelchair)?: A Little Help needed standing up from a chair using your arms (e.g., wheelchair or bedside chair)?: A Little Help needed to walk in hospital room?: A Little Help needed climbing 3-5 steps with a railing? : A Lot 6 Click Score: 19    End of Session Equipment Utilized During Treatment: Gait belt Activity Tolerance: Treatment limited secondary to medical complications (Comment) (decreased responsiveness) Patient left: in bed;with call bell/phone within reach;with  bed alarm set Nurse Communication: Mobility status PT Visit Diagnosis: Other abnormalities of gait and mobility (R26.89);Difficulty in walking, not elsewhere classified (R26.2)    Time: 1478-2956 PT Time Calculation (min) (ACUTE ONLY): 16 min   Charges:   PT Evaluation $PT Eval Moderate Complexity: 1 Mod          Farley Ly, PT, DPT  Acute Rehabilitation Services  Pager: [phone] Office: [phone]   Lehman Prom 01/15/2022, 10:59 AM
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
## 2022-01-15 NOTE — Assessment & Plan Note (Addendum)
-  WBC Mild at 2.8 -Patient with history of HIV.  Patient remained afebrile.  No signs or symptoms of infection noted.   -Outpatient follow-up at HIV clinic and with PCP.

## 2022-01-15 NOTE — Assessment & Plan Note (Signed)
-   Smoking cessation counseling given and he was provided with a nicotine patch

## 2022-01-15 NOTE — Assessment & Plan Note (Addendum)
-  CXR showed "Severe bullous emphysema. Possible small right apical pneumothorax. CT may provide better evaluation." -CT Chest done and showed "No pneumothorax. Severe bullous disease right-greater-than-left upper lobes, paraseptal emphysematous change in the lower and right middle lobes. Similar findings in 2018.  Aortic and coronary artery atherosclerosis.  General paucity of body fat, but no more than in 2018. Clinical correlation." -Patient placed on mometasone-formoterol 2 puffs IH twice daily during the hospitalization and will be discharged home on Dulera, Spiriva as well as Combivent. -Outpatient follow-up with PCP. -May need outpatient referral to pulmonary.

## 2022-01-15 NOTE — Evaluation (Signed)
Occupational Therapy Evaluation Patient Details Name: Curtis Hodge MRN: [id number] DOB: [birth year] Today's Date: 01/15/2022   History of Present Illness Pt is a 56 y/o male admitted secondary to R foot pain and syncope. Pt with increased chest pain as well. PMH includes COPD, HTN, and hep B.   Clinical Impression   PTA patient independent. Admitted for above and presents with problem list below, including pain, decreased activity tolerance.  Pt completing ADLs with min guard, sit to stand at EOB with min guard but noted decreased responsiveness with eyes closed- assisted back to sitting position and pt reports feeling better in sitting.  Anticipate he will progress well once symptoms improve.  Will follow acutely.   BP supine: 134/90  BP sitting: 127/107 BP standing (completed in sitting): 119/74     Recommendations for follow up therapy are one component of a multi-disciplinary discharge planning process, led by the attending physician.  Recommendations may be updated based on patient status, additional functional criteria and insurance authorization.   Follow Up Recommendations  No OT follow up    Assistance Recommended at Discharge Intermittent Supervision/Assistance  Patient can return home with the following A little help with walking and/or transfers;A little help with bathing/dressing/bathroom;Help with stairs or ramp for entrance;Assist for transportation;Assistance with cooking/housework    Functional Status Assessment  Patient has had a recent decline in their functional status and demonstrates the ability to make significant improvements in function in a reasonable and predictable amount of time.  Equipment Recommendations  None recommended by OT    Recommendations for Other Services       Precautions / Restrictions Precautions Precautions: Fall Precaution Comments: watch BP Restrictions Weight Bearing Restrictions: No      Mobility Bed Mobility Overal bed  mobility: Modified Independent                  Transfers Overall transfer level: Needs assistance Equipment used: None Transfers: Sit to/from Stand Sit to Stand: Min guard           General transfer comment: Min guard for safety. Upon standing. Pt with eyes closed and with decreased responsiveness so had to assist pt into sitting. Pt began talking upon return to sitting.      Balance Overall balance assessment: Needs assistance Sitting-balance support: No upper extremity supported, Feet supported Sitting balance-Leahy Scale: Good     Standing balance support: No upper extremity supported Standing balance-Leahy Scale: Fair                             ADL either performed or assessed with clinical judgement   ADL Overall ADL's : Needs assistance/impaired     Grooming: Set up;Sitting           Upper Body Dressing : Set up;Sitting   Lower Body Dressing: Min guard;Sit to/from stand               Functional mobility during ADLs: Min guard       Vision   Vision Assessment?: No apparent visual deficits     Perception     Praxis      Pertinent Vitals/Pain Pain Assessment Pain Assessment: Faces Faces Pain Scale: Hurts little more Pain Location: R foot, chest Pain Descriptors / Indicators: Grimacing, Guarding, Pressure Pain Intervention(s): Monitored during session, Limited activity within patient's tolerance, Repositioned     Hand Dominance     Extremity/Trunk Assessment Upper Extremity Assessment Upper Extremity Assessment:  Overall [facility] for tasks assessed   Lower Extremity Assessment Lower Extremity Assessment: Defer to PT evaluation RLE Deficits / Details: R transmet at baseline. Tender to touch   Cervical / Trunk Assessment Cervical / Trunk Assessment: Normal   Communication Communication Communication: No difficulties   Cognition Arousal/Alertness: Awake/alert Behavior During Therapy: WFL for tasks  assessed/performed Overall Cognitive Status: Within Functional Limits for tasks assessed                                       General Comments  BP monitored, did notice drop in BP- see flowsheet    Exercises     Shoulder Instructions      Home Living Family/patient expects to be discharged to:: Unsure Living Arrangements: Other relatives (sister) Available Help at Discharge: Family Type of Home: House Home Access: Level entry     Home Layout: Two level;Able to live on main level with bedroom/bathroom     Bathroom Shower/Tub: Tub/shower unit;Walk-in shower         Home Equipment: None   Additional Comments: Pt reports he was homeless but plans to stay with his sister      Prior Functioning/Environment Prior Level of Function : Independent/Modified Independent                        OT Problem List: Decreased activity tolerance;Impaired balance (sitting and/or standing);Decreased safety awareness;Decreased knowledge of use of DME or AE;Decreased knowledge of precautions      OT Treatment/Interventions: Self-care/ADL training;Therapeutic exercise;DME and/or AE instruction;Therapeutic activities;Patient/family education;Balance training    OT Goals(Current goals can be found in the care plan section) Acute Rehab OT Goals Patient Stated Goal: get better OT Goal Formulation: With patient Time For Goal Achievement: 01/29/22 Potential to Achieve Goals: Good  OT Frequency: Min 2X/week    Co-evaluation              AM-PAC OT "6 Clicks" Daily Activity     Outcome Measure Help from another person eating meals?: None Help from another person taking care of personal grooming?: A Little Help from another person toileting, which includes using toliet, bedpan, or urinal?: A Little Help from another person bathing (including washing, rinsing, drying)?: A Little Help from another person to put on and taking off regular upper body clothing?: A  Little Help from another person to put on and taking off regular lower body clothing?: A Little 6 Click Score: 19   End of Session Nurse Communication: Mobility status  Activity Tolerance: Patient tolerated treatment well Patient left: with call bell/phone within reach;in bed;with bed alarm set  OT Visit Diagnosis: Other abnormalities of gait and mobility (R26.89)                Time: 7673-4193 OT Time Calculation (min): 15 min Charges:  OT General Charges $OT Visit: 1 Visit OT Evaluation $OT Eval Moderate Complexity: 1 Mod  Barry Brunner, OT Acute Rehabilitation Services Pager [phone] Office [phone]   Chancy Milroy 01/15/2022, 11:05 AM

## 2022-01-15 NOTE — Care Management (Signed)
1623 01-15-22 Case Manager received a consult for: needing a offloading boot for right foot. Case Manager asked staff RN to call ortho to see if they can assist with this. Case Manager unable to assist with this request. Case Manager will continue to follow for additional transition of care needs.
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
## 2022-01-15 NOTE — Plan of Care (Signed)
Problem: Education: °Goal: Knowledge of General Education information will improve °Description: Including pain rating scale, medication(s)/side effects and non-pharmacologic comfort measures °Outcome: Completed/Met °  °

## 2022-01-16 ENCOUNTER — Other Ambulatory Visit (HOSPITAL_COMMUNITY): Payer: Self-pay

## 2022-01-16 DIAGNOSIS — R55 Syncope and collapse: Secondary | ICD-10-CM | POA: Diagnosis present

## 2022-01-16 DIAGNOSIS — W19XXXA Unspecified fall, initial encounter: Secondary | ICD-10-CM | POA: Diagnosis present

## 2022-01-16 DIAGNOSIS — D72819 Decreased white blood cell count, unspecified: Secondary | ICD-10-CM | POA: Diagnosis present

## 2022-01-16 DIAGNOSIS — B181 Chronic viral hepatitis B without delta-agent: Secondary | ICD-10-CM | POA: Diagnosis present

## 2022-01-16 DIAGNOSIS — F329 Major depressive disorder, single episode, unspecified: Secondary | ICD-10-CM

## 2022-01-16 DIAGNOSIS — M79671 Pain in right foot: Secondary | ICD-10-CM | POA: Diagnosis present

## 2022-01-16 DIAGNOSIS — Z91199 Patient's noncompliance with other medical treatment and regimen due to unspecified reason: Secondary | ICD-10-CM | POA: Diagnosis not present

## 2022-01-16 DIAGNOSIS — T424X5A Adverse effect of benzodiazepines, initial encounter: Secondary | ICD-10-CM | POA: Diagnosis not present

## 2022-01-16 DIAGNOSIS — Z79899 Other long term (current) drug therapy: Secondary | ICD-10-CM | POA: Diagnosis not present

## 2022-01-16 DIAGNOSIS — F17210 Nicotine dependence, cigarettes, uncomplicated: Secondary | ICD-10-CM | POA: Diagnosis present

## 2022-01-16 DIAGNOSIS — F209 Schizophrenia, unspecified: Secondary | ICD-10-CM | POA: Diagnosis present

## 2022-01-16 DIAGNOSIS — J439 Emphysema, unspecified: Secondary | ICD-10-CM | POA: Diagnosis present

## 2022-01-16 DIAGNOSIS — M109 Gout, unspecified: Secondary | ICD-10-CM | POA: Diagnosis present

## 2022-01-16 DIAGNOSIS — Z89431 Acquired absence of right foot: Secondary | ICD-10-CM | POA: Diagnosis not present

## 2022-01-16 DIAGNOSIS — R0789 Other chest pain: Secondary | ICD-10-CM | POA: Diagnosis present

## 2022-01-16 DIAGNOSIS — F1410 Cocaine abuse, uncomplicated: Secondary | ICD-10-CM | POA: Diagnosis present

## 2022-01-16 DIAGNOSIS — Z20822 Contact with and (suspected) exposure to covid-19: Secondary | ICD-10-CM | POA: Diagnosis present

## 2022-01-16 DIAGNOSIS — L02611 Cutaneous abscess of right foot: Secondary | ICD-10-CM | POA: Diagnosis not present

## 2022-01-16 DIAGNOSIS — Z5900 Homelessness unspecified: Secondary | ICD-10-CM | POA: Diagnosis not present

## 2022-01-16 DIAGNOSIS — Z89421 Acquired absence of other right toe(s): Secondary | ICD-10-CM | POA: Diagnosis not present

## 2022-01-16 DIAGNOSIS — I25111 Atherosclerotic heart disease of native coronary artery with angina pectoris with documented spasm: Secondary | ICD-10-CM | POA: Diagnosis present

## 2022-01-16 DIAGNOSIS — R079 Chest pain, unspecified: Secondary | ICD-10-CM | POA: Diagnosis not present

## 2022-01-16 DIAGNOSIS — B20 Human immunodeficiency virus [HIV] disease: Secondary | ICD-10-CM | POA: Diagnosis present

## 2022-01-16 DIAGNOSIS — R52 Pain, unspecified: Secondary | ICD-10-CM

## 2022-01-16 DIAGNOSIS — Z9114 Patient's other noncompliance with medication regimen: Secondary | ICD-10-CM | POA: Diagnosis not present

## 2022-01-16 DIAGNOSIS — F419 Anxiety disorder, unspecified: Secondary | ICD-10-CM | POA: Diagnosis present

## 2022-01-16 DIAGNOSIS — R400 Somnolence: Secondary | ICD-10-CM | POA: Diagnosis not present

## 2022-01-16 LAB — CBC WITH DIFFERENTIAL/PLATELET
Abs Immature Granulocytes: 0 10*3/uL (ref 0.00–0.07)
Basophils Absolute: 0 10*3/uL (ref 0.0–0.1)
Basophils Relative: 0 %
Eosinophils Absolute: 0 10*3/uL (ref 0.0–0.5)
Eosinophils Relative: 1 %
HCT: 41.7 % (ref 39.0–52.0)
Hemoglobin: 13.8 g/dL (ref 13.0–17.0)
Immature Granulocytes: 0 %
Lymphocytes Relative: 52 %
Lymphs Abs: 1.5 10*3/uL (ref 0.7–4.0)
MCH: 31.4 pg (ref 26.0–34.0)
MCHC: 33.1 g/dL (ref 30.0–36.0)
MCV: 94.8 fL (ref 80.0–100.0)
Monocytes Absolute: 0.3 10*3/uL (ref 0.1–1.0)
Monocytes Relative: 11 %
Neutro Abs: 1 10*3/uL — ABNORMAL LOW (ref 1.7–7.7)
Neutrophils Relative %: 36 %
Platelets: 165 10*3/uL (ref 150–400)
RBC: 4.4 MIL/uL (ref 4.22–5.81)
RDW: 12.3 % (ref 11.5–15.5)
WBC: 2.8 10*3/uL — ABNORMAL LOW (ref 4.0–10.5)
nRBC: 0 % (ref 0.0–0.2)

## 2022-01-16 LAB — COMPREHENSIVE METABOLIC PANEL
ALT: 16 U/L (ref 0–44)
AST: 16 U/L (ref 15–41)
Albumin: 3.2 g/dL — ABNORMAL LOW (ref 3.5–5.0)
Alkaline Phosphatase: 70 U/L (ref 38–126)
Anion gap: 7 (ref 5–15)
BUN: 9 mg/dL (ref 6–20)
CO2: 25 mmol/L (ref 22–32)
Calcium: 8.8 mg/dL — ABNORMAL LOW (ref 8.9–10.3)
Chloride: 106 mmol/L (ref 98–111)
Creatinine, Ser: 1.06 mg/dL (ref 0.61–1.24)
GFR, Estimated: >60 mL/min (ref >60)
Glucose, Bld: 100 mg/dL — ABNORMAL HIGH (ref 70–99)
Potassium: 4 mmol/L (ref 3.5–5.1)
Sodium: 138 mmol/L (ref 135–145)
Total Bilirubin: 0.4 mg/dL (ref 0.3–1.2)
Total Protein: 6.8 g/dL (ref 6.5–8.1)

## 2022-01-16 LAB — HEPATIC FUNCTION PANEL
ALT: 18 U/L (ref 0–44)
AST: 20 U/L (ref 15–41)
Albumin: 3.5 g/dL (ref 3.5–5.0)
Alkaline Phosphatase: 81 U/L (ref 38–126)
Bilirubin, Direct: <0.1 mg/dL (ref 0.0–0.2)
Total Bilirubin: 0.8 mg/dL (ref 0.3–1.2)
Total Protein: 7.2 g/dL (ref 6.5–8.1)

## 2022-01-16 LAB — PHOSPHORUS: Phosphorus: 4.3 mg/dL (ref 2.5–4.6)

## 2022-01-16 LAB — MAGNESIUM: Magnesium: 2.2 mg/dL (ref 1.7–2.4)

## 2022-01-16 LAB — RPR: RPR Ser Ql: NONREACTIVE

## 2022-01-16 MED ORDER — ACETAMINOPHEN 325 MG PO TABS: 650.0000 mg | ORAL_TABLET | ORAL | Status: DC | PRN

## 2022-01-16 MED ORDER — CARVEDILOL 3.125 MG PO TABS
3.1250 mg | ORAL_TABLET | Freq: Two times a day (BID) | ORAL | 1 refills | Status: DC
  Filled 2022-01-16 – 2022-02-12 (×2): qty 60, 30d supply, fill #0

## 2022-01-16 MED ORDER — HYDROXYZINE HCL 25 MG PO TABS
25.0000 mg | ORAL_TABLET | Freq: Three times a day (TID) | ORAL | 0 refills | Status: DC | PRN
  Filled 2022-01-16 – 2022-02-12 (×2): qty 20, 7d supply, fill #0

## 2022-01-16 MED ORDER — DARUN-COBIC-EMTRICIT-TENOFAF 800-150-200-10 MG PO TABS
1.0000 | ORAL_TABLET | Freq: Every day | ORAL | 0 refills | Status: DC
  Filled 2022-01-16: qty 30, 30d supply, fill #0

## 2022-01-16 MED ORDER — SPIRIVA HANDIHALER 18 MCG IN CAPS
18.0000 ug | ORAL_CAPSULE | Freq: Every day | RESPIRATORY_TRACT | 2 refills | Status: DC
  Filled 2022-01-16 – 2022-02-12 (×2): qty 30, 30d supply, fill #0

## 2022-01-16 MED ORDER — DULERA 100-5 MCG/ACT IN AERO
2.0000 | INHALATION_SPRAY | Freq: Two times a day (BID) | RESPIRATORY_TRACT | 1 refills | Status: DC
  Filled 2022-01-16 – 2022-02-12 (×2): qty 13, 30d supply, fill #0

## 2022-01-16 MED ORDER — GABAPENTIN 300 MG PO CAPS
300.0000 mg | ORAL_CAPSULE | Freq: Three times a day (TID) | ORAL | 1 refills | Status: DC
  Filled 2022-01-16 – 2022-02-12 (×2): qty 90, 30d supply, fill #0

## 2022-01-16 MED ORDER — NICOTINE 21 MG/24HR TD PT24
21.0000 mg | MEDICATED_PATCH | Freq: Every day | TRANSDERMAL | 0 refills | Status: DC
  Filled 2022-01-16 – 2022-02-12 (×2): qty 28, 28d supply, fill #0

## 2022-01-16 MED ORDER — DOLUTEGRAVIR SODIUM 50 MG PO TABS
50.0000 mg | ORAL_TABLET | Freq: Every day | ORAL | 0 refills | Status: DC
  Filled 2022-01-16: qty 30, 30d supply, fill #0

## 2022-01-16 MED ORDER — SERTRALINE HCL 50 MG PO TABS
50.0000 mg | ORAL_TABLET | Freq: Every day | ORAL | 1 refills | Status: DC
  Filled 2022-01-16 – 2022-02-12 (×2): qty 30, 30d supply, fill #0

## 2022-01-16 MED ORDER — ASPIRIN 81 MG PO TBEC
81.0000 mg | DELAYED_RELEASE_TABLET | Freq: Every day | ORAL | 11 refills | Status: DC
Start: 2022-01-17 — End: 2022-11-08

## 2022-01-16 MED ORDER — COMBIVENT RESPIMAT 20-100 MCG/ACT IN AERS
1.0000 | INHALATION_SPRAY | Freq: Four times a day (QID) | RESPIRATORY_TRACT | 1 refills | Status: DC | PRN
  Filled 2022-01-16 – 2022-02-12 (×2): qty 4, 30d supply, fill #0

## 2022-01-16 MED ORDER — ATORVASTATIN CALCIUM 40 MG PO TABS
40.0000 mg | ORAL_TABLET | Freq: Every day | ORAL | 1 refills | Status: DC
  Filled 2022-01-16 – 2022-02-12 (×3): qty 30, 30d supply, fill #0

## 2022-01-16 NOTE — Discharge Summary (Signed)
Physician Discharge Summary  Curtis Hodge DOB: 03/25/1966 DOA: 01/14/2022  PCP: Pcp, No  Admit date: 01/14/2022 Discharge date: 01/16/2022  Time spent: 60 minutes  Recommendations for Outpatient Follow-up:  Follow-up with Dr. Gale Journey, infectious disease 01/25/2022 for follow-up on HIV and further management. Follow-up with Dr. Posey Pronto, podiatry in 2 weeks to be fitted for prosthesis. Follow-up with Dr. Harrell Gave, cardiology in 1 to 2 weeks for further work-up of chest pain and outpatient stress test. Follow-up at [facility] on 01/17/2022 for hospital follow-up.  On follow-up patient will need a basic metabolic profile, magnesium level done to follow-up on electrolytes and renal function.  CBC will also need to be done to follow-up on counts.  Patient will need further management of both of bullous emphysema and may need outpatient referral to pulmonary.  Patient has history of schizophrenia and major depressive disorder will need to be followed up upon, may need outpatient referral to psychiatry for further management.   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 Michelene Gardener Curtis Hodge is a 56 y.o. male with medical history significant of HIV noncompliant with medications, chronic hepatitis B, COPD/emphysema, anxiety/depression, right forefoot amputation, presented with recurrent chest pains and syncope x1.   Patient started to have constant right-sided pressure-like chest pain about 7 days ago, has been  constant, worsening with exertion, associated with shortness of breath.  Denies any nauseous vomiting, no sweating.  Patient came to ED this morning for evaluation of chest pains.  Work-up showed troponin negative x2 and EKG no acute ST changes and patient was discharged from ED.  However while in a parking lot, patient started to feel lightheadedness and fell down and lost consciousness.  Patient was found by bystanders and sent to ED.  Right now, patient still complaining about pressure-like chest pain on the left side of the chest 3-4/10.  Patient endorsed being abusing cocaine and last use was 2 nights ago.   ED Course: No tachycardia no hypertension   EKG shows T wave inversions on V4-V6.   Troponins negative x2.  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 Harrell Gave who reviewed the patient's CT scan which showed three-vessel sclerosis.  Given that he had negative troponins x2 cardiology recommended echocardiogram -Echocardiogram done and showed an EF of 55 to 60% with a normal left ventricular function.  There is no regional wall motion abnormalities and left ventricular diastolic parameters were consistent with grade 1 diastolic dysfunction.  Patient also had right ventricular systolic function that was normal and not mitral valve is normal structure as well as a aortic valve that was tricuspid with no aortic valve regurgitation visualized and no aortic stenosis present. -Patient was given lorazepam 1 mg p.o. twice daily x3 doses which led to somnolence.  -Lorazepam discontinued with resolution of somnolence. -He was initiated on carvedilol 3.125 mg p.o. twice daily  as well as ASA 81 mg p.o. daily and atorvastatin 40 mg p.o. daily -Cardiology recommended outpatient follow-up for stress test -tobacco cessation counseling given and patient had a lipid  panel done for risk stratification. -Patient had no further chest pain during the hospitalization and chest pain had resolved by day of discharge. -Outpatient follow-up with cardiology for further evaluation and outpatient stress test.  Leukopenia -WBC Mild at 2.8 -Patient with history of HIV.  Patient remained afebrile.  No signs or symptoms of infection noted.   -Outpatient follow-up at HIV clinic and with PCP.   Syncope and collapse - Likely related to coronary artery spasm and 2D echo obtained with a EF of 55 to 60%, NWMA, no valvular stenosis noted. -Orthostatics were checked and patient was not orthostatic. -CT chest done was negative for pneumothorax, severe bullous disease right greater than left in the upper lobes, paraseptal emphysematous change in the lower and middle lobes noted similar findings to 2018, aortic and coronary artery atherosclerosis. -Patient gently hydrated with IV fluids. -Patient improved clinically and no further syncopal episodes noted. -Outpatient follow-up with PCP.   History of transmetatarsal amputation of right foot ([place])- (present on admission) - With complaining of right foot pain and is status post right foot transmetatarsal amputation 3 years ago -Not wearing specialized shoes and now has a huge callus on his fourth and fifth metatarsal stump. -Offloading shoes recommended and outpatient podiatry follow-up for specialized shoes -Patient seen in consultation by Dr. Posey Pronto with podiatry on 01/16/2022 and callus debrided down with a chisel blade per podiatry with no ulceration noted, no underlying skin broken, no signs of infection noted. -Podiatry recommended weightbearing as tolerated in regular shoes, outpatient follow-up with podiatry to be fitted for prosthesis. -Patient will be discharged in stable condition.  Substance abuse ([place])- (present on admission) -Patient was positive for cocaine and THC on his UDS -Counseling was given and for some reason  he started on Ativan and carvedilol -TOC consulted.    Homelessness - TOC consulted. -Patient was discharged home with his sister who he will be residing with per TOC.  MDD (major depressive disorder)- (present on admission) Hx of Schizophrenia -During the hospitalization patient maintained on SSRI with sertraline 50 mg daily as well as continued on olanzapine 10 mg nightly.   -Patient noted to have not been compliant in the outpatient setting and will need outpatient follow-up for further management.  -Patient will be discharged on sertraline.  Bullous emphysema ([place])- (present on admission) -CXR showed "Severe bullous emphysema. Possible small right apical pneumothorax. CT may provide better evaluation." -CT Chest done and showed "No pneumothorax. Severe bullous disease right-greater-than-left upper lobes, paraseptal emphysematous change in the lower and right middle lobes. Similar findings in 2018.  Aortic and coronary artery atherosclerosis.  General paucity of body fat, but no more than in 2018. Clinical correlation." -Patient placed on mometasone-formoterol 2 puffs IH twice daily during the hospitalization and will be discharged home on Dulera, Spiriva as well as Combivent. -Outpatient follow-up with PCP. -May need outpatient referral to pulmonary.  HIV disease ([place])- (present on admission) - His CD4 and an T-helper cell count was obtained was extremely low likely given his noncompliance; CD4 count was 251 -Patient maintained on antivirals.   Darunavir-Cobistat-Emtricitabine-Tenofovir 800-150-200-10 1 tab po Daily and continue with Dolutegravir 50 mg po Daily -Patient will follow-up with ID in the outpatient setting, appointment arranged prior to discharge.  SMOKER- (present on admission) - Smoking cessation counseling given and he was provided  with a nicotine patch        Procedures: CT head 01/14/2022 CT chest 01/14/2022 Chest x-ray 01/14/2022 Plain films of the right foot  01/14/2022 2D echo 01/14/2022  Consultations: Curb sided cardiology: Dr. Harrell Gave Podiatry: Dr. Boneta Lucks 01/16/2022  Discharge Exam: Vitals:   01/16/22 0632 01/16/22 0930  BP:  135/89  Pulse: 66 76  Resp:  18  Temp:  98 F (36.7 C)  SpO2: 98% 98%    General: NAD Cardiovascular: Regular rate rhythm no murmurs rubs or gallops.  No JVD.  No lower extremity edema. Respiratory: Clear to auscultation bilaterally.  No wheezes, no crackles, no rhonchi.  Discharge Instructions   Discharge Instructions     Diet - low sodium heart healthy   Complete by: As directed    Increase activity slowly   Complete by: As directed    Weightbearing as tolerated in regular shoe.      Allergies as of 01/16/2022   No Known Allergies      Medication List     STOP taking these medications    doxycycline 100 MG tablet Commonly known as: VIBRA-TABS   erythromycin ophthalmic ointment   HYDROcodone-acetaminophen 5-325 MG tablet Commonly known as: NORCO/VICODIN   mirtazapine 15 MG tablet Commonly known as: REMERON   olopatadine 0.1 % ophthalmic solution Commonly known as: PATANOL       TAKE these medications    acetaminophen 325 MG tablet Commonly known as: TYLENOL Take 2 tablets (650 mg total) by mouth every 4 (four) hours as needed for headache or mild pain.   aspirin 81 MG EC tablet Take 1 tablet (81 mg total) by mouth daily. Swallow whole. Start taking on: January 17, 2022   atorvastatin 40 MG tablet Commonly known as: LIPITOR Take 1 tablet (40 mg total) by mouth daily. Start taking on: January 17, 2022   carvedilol 3.125 MG tablet Commonly known as: COREG Take 1 tablet (3.125 mg total) by mouth 2 (two) times daily with a meal.   Combivent Respimat 20-100 MCG/ACT Aers respimat Generic drug: Ipratropium-Albuterol Inhale 1 puff into the lungs every 6 (six) hours as needed for wheezing.   Dulera 100-5 MCG/ACT Aero Generic drug: mometasone-formoterol Inhale 2 puffs into  the lungs 2 (two) times daily.   gabapentin 300 MG capsule Commonly known as: NEURONTIN Take 1 capsule (300 mg total) by mouth 3 (three) times daily.   hydrOXYzine 25 MG tablet Commonly known as: ATARAX Take 1 tablet (25 mg total) by mouth 3 (three) times daily as needed for anxiety.   nicotine 21 mg/24hr patch Commonly known as: NICODERM CQ - dosed in mg/24 hours Place 1 patch (21 mg total) onto the skin daily. Start taking on: January 17, 2022   OLANZapine 10 MG tablet Commonly known as: ZYPREXA Take 1 tablet (10 mg total) by mouth at bedtime.   sertraline 50 MG tablet Commonly known as: ZOLOFT Take 1 tablet (50 mg total) by mouth daily.   Spiriva HandiHaler 18 MCG inhalation capsule Generic drug: tiotropium Place 1 capsule (18 mcg total) into inhaler and inhale daily.   Symtuza 800-150-200-10 MG Tabs Generic drug: Darunavir-Cobicistat-Emtricitabine-Tenofovir Alafenamide Take 1 tablet by mouth daily with breakfast.   Tivicay 50 MG tablet Generic drug: dolutegravir Take 1 tablet (50 mg total) by mouth daily.       No Known Allergies  Follow-up Information     Jabier Mutton, MD Follow up on 01/25/2022.   Specialty: Infectious Diseases Why: Hospital Discharge Follow Up @  3:00 pm. Please arrive 15 minutes prior to your appointment. Call to reschedule at [phone]. Contact information: [place] [address] [phone]         Felipa Furnace, DPM Follow up.   Specialty: Podiatry Why: Patient can follow-up in the clinic 2 weeks from discharge to be fitted for prosthesis Contact information: [address] [place] [address] [phone]         [facility] Follow up.   Specialty: Home Health Services Why: [place] to call with visit times. Contact information: [address] [place] [address] [phone]         Buford Dresser, MD Follow up in 2 week(s).    Specialty: Cardiology Why: f/u in 1-2 weeks for outpatient stress test. Contact information: [address] [place] [address] [place] Follow up on 01/17/2022.   Why: New Address: [address]. @ 9:30 am with Dr. Margarita Rana. Please make this scheduled appointment. Contact information: [address] [place] [phone]                 The results of significant diagnostics from this hospitalization (including imaging, microbiology, ancillary and laboratory) are listed below for reference.    Significant Diagnostic Studies: DG Chest 2 View  Result Date: 01/14/2022 CLINICAL DATA:  Shortness of breath. EXAM: CHEST - 2 VIEW COMPARISON:  Chest radiograph dated 07/06/2018. FINDINGS: Severe bullous emphysema. No focal consolidation, or pleural effusion. Evaluation for pneumothorax is very due to severe bullous changes of the right upper lobe. A small right apical pneumothorax may be present, less than 10%. CT may provide better evaluation. The cardiac silhouette is within normal limits. No acute osseous pathology. IMPRESSION: 1. Severe bullous emphysema. 2. Possible small right apical pneumothorax. CT may provide better evaluation. Electronically Signed   By: Anner Crete M.D.   On: 01/14/2022 02:59   CT HEAD WO CONTRAST (5MM)  Result Date: 01/14/2022 CLINICAL DATA:  Headache EXAM: CT HEAD WITHOUT CONTRAST TECHNIQUE: Contiguous axial images were obtained from the base of the skull through the vertex without intravenous contrast. RADIATION DOSE REDUCTION: This exam was performed according to the departmental dose-optimization program which includes automated exposure control, adjustment of the mA and/or kV according to patient size and/or use of iterative reconstruction technique. COMPARISON:  None. FINDINGS: Brain: No evidence of acute infarction,  hemorrhage, hydrocephalus, extra-axial collection or mass lesion/mass effect. Vascular: No hyperdense vessel or unexpected calcification. Skull: Normal. Negative for fracture or focal lesion. Sinuses/Orbits: No acute finding. Other: None. IMPRESSION: No acute intracranial pathology. No noncontrast CT findings to explain headache. Electronically Signed   By: Delanna Ahmadi M.D.   On: 01/14/2022 12:38   CT Chest Wo Contrast  Result Date: 01/14/2022 CLINICAL DATA:  Recent worsening of exertional shortness of breath. Suspected pneumothorax on PA and lateral chest earlier today. EXAM: CT CHEST WITHOUT CONTRAST TECHNIQUE: Multidetector CT imaging of the chest was performed following the standard protocol without IV contrast. RADIATION DOSE REDUCTION: This exam was performed according to the departmental dose-optimization program which includes automated exposure control, adjustment of the mA and/or kV according to patient size and/or use of iterative reconstruction technique. COMPARISON:  PA Lat chest today, PA Lat 06/26/2018, and CTA chest 02/23/2017. FINDINGS: Cardiovascular: The cardiac size is normal. There is patchy  three-vessel calcific CAD. There is aortic atherosclerosis, scattered calcification in the branch arteries , normal caliber of the aorta and normal caliber of the central pulmonary arteries and veins. There is no pericardial effusion. Mediastinum/Nodes: No enlarged mediastinal or axillary lymph nodes. Thyroid gland, trachea, and esophagus demonstrate no significant findings. Lungs/Pleura: Severe bullous disease both upper lobes, greater on the right. Lesser paraseptal emphysematous change in the lower and right middle lobes. No pneumothorax is seen. Central airways are clear. There is no evidence of focal pneumonia or mass, no pleural effusion or thickening. Minimal posterior atelectasis. Upper Abdomen: No acute findings. Musculoskeletal: No chest wall mass or suspicious bone lesions identified. The  current and prior studies both exhibit a general paucity of body fat. This could be normal for the patient or could indicate cachexia/anorexia. IMPRESSION: 1. No pneumothorax. 2. Severe bullous disease right-greater-than-left upper lobes, paraseptal emphysematous change in the lower and right middle lobes. Similar findings in 2018. 3. Aortic and coronary artery atherosclerosis. 4. General paucity of body fat, but no more than in 2018. Clinical correlation. Electronically Signed   By: Telford Nab M.D.   On: 01/14/2022 04:39   DG Foot 2 Views Right  Result Date: 01/14/2022 CLINICAL DATA:  Pain EXAM: RIGHT FOOT - 2 VIEW COMPARISON:  07/21/2018 FINDINGS: There is previous transmetatarsal amputation of right foot. No fracture or dislocation is seen. There are no focal lytic lesions. There are no abnormal pockets of air in the soft tissues. IMPRESSION: Previous transmetatarsal amputation. No acute findings are seen. If there is clinical suspicion for osteomyelitis, follow-up MRI may be considered. Electronically Signed   By: Elmer Picker M.D.   On: 01/14/2022 14:45   DG Foot 2 Views Right  Result Date: 01/14/2022 CLINICAL DATA:  Right foot pain.  There is partial foot amputation. EXAM: RIGHT FOOT - 2 VIEW COMPARISON:  MR right foot 07/24/2018 FINDINGS: Forefoot amputation has been performed from the level of the mid metatarsal shafts since the prior study when only the toes were amputated. There is slight soft tissue fullness in the soft tissues stump. No soft tissue gas or radiopaque foreign body is seen. There are vascular calcifications in the distal foreleg and foot. Arthritic changes are not seen and no findings of acute osteomyelitis. In all other respects no further changes. IMPRESSION: 1. Transmetatarsal amputation of the forefoot from the mid metatarsal shafts. 2. Slight soft tissue fullness in the adjacent stump. No soft tissue gas is seen. 3. No destructive bone lesion concerning for acute  osteomyelitis. 4. Vascular calcifications. Electronically Signed   By: Telford Nab M.D.   On: 01/14/2022 04:13   ECHOCARDIOGRAM COMPLETE  Result Date: 01/14/2022    ECHOCARDIOGRAM REPORT   Patient Name:   Curtis Hodge [facility] Date of Exam: 01/14/2022 Medical Rec #:  [id number]          Height:       70.0 in Accession #:    [id number]         Weight:       150.0 lb Date of Birth:  09-07-1966          BSA:          1.847 m Patient Age:    33 years           BP:           134/89 mmHg Patient Gender: M                  HR:  66 bpm. Exam Location:  Inpatient Procedure: 2D Echo, Cardiac Doppler and Color Doppler Indications:    R07.96 CHEST PAIN  History:        Patient has no prior history of Echocardiogram examinations.                 COPD. HEP B/ HIV / CHEST PAIN.  Sonographer:    Beryle Beams Referring Phys: [id number] [place]  1. Left ventricular ejection fraction, by estimation, is 55 to 60%. The left ventricle has normal function. The left ventricle has no regional wall motion abnormalities. Left ventricular diastolic parameters are consistent with Grade I diastolic dysfunction (impaired relaxation).  2. Right ventricular systolic function is normal. The right ventricular size is normal.  3. The mitral valve is normal in structure. Trivial mitral valve regurgitation.  4. The aortic valve is tricuspid. Aortic valve regurgitation is not visualized. No aortic stenosis is present.  5. The inferior vena cava is normal in size with <50% respiratory variability, suggesting right atrial pressure of 8 mmHg. FINDINGS  Left Ventricle: Left ventricular ejection fraction, by estimation, is 55 to 60%. The left ventricle has normal function. The left ventricle has no regional wall motion abnormalities. The left ventricular internal cavity size was normal in size. There is  no left ventricular hypertrophy. Left ventricular diastolic parameters are consistent with Grade I diastolic dysfunction (impaired  relaxation). Right Ventricle: The right ventricular size is normal. No increase in right ventricular wall thickness. Right ventricular systolic function is normal. Left Atrium: Left atrial size was normal in size. Right Atrium: Right atrial size was normal in size. Pericardium: There is no evidence of pericardial effusion. Mitral Valve: The mitral valve is normal in structure. Trivial mitral valve regurgitation. Tricuspid Valve: The tricuspid valve is normal in structure. Tricuspid valve regurgitation is trivial. Aortic Valve: The aortic valve is tricuspid. Aortic valve regurgitation is not visualized. No aortic stenosis is present. Aortic valve mean gradient measures 3.0 mmHg. Aortic valve peak gradient measures 6.2 mmHg. Aortic valve area, by VTI measures 3.02 cm. Pulmonic Valve: The pulmonic valve was normal in structure. Pulmonic valve regurgitation is mild. Aorta: The aortic root is normal in size and structure. Venous: The inferior vena cava is normal in size with less than 50% respiratory variability, suggesting right atrial pressure of 8 mmHg. IAS/Shunts: The atrial septum is grossly normal.  LEFT VENTRICLE PLAX 2D LVIDd:         4.80 cm     Diastology LVIDs:         3.50 cm     LV e' medial:    7.51 cm/s LV PW:         0.90 cm     LV E/e' medial:  6.6 LV IVS:        1.00 cm     LV e' lateral:   13.20 cm/s LVOT diam:     2.20 cm     LV E/e' lateral: 3.7 LV SV:         67 LV SV Index:   36 LVOT Area:     3.80 cm  LV Volumes (MOD) LV vol d, MOD A2C: 87.9 ml LV vol d, MOD A4C: 70.0 ml LV vol s, MOD A2C: 38.8 ml LV vol s, MOD A4C: 29.9 ml LV SV MOD A2C:     49.1 ml LV SV MOD A4C:     70.0 ml LV SV MOD BP:      57.6 ml RIGHT  VENTRICLE             IVC RV S prime:     11.30 cm/s  IVC diam: 1.70 cm TAPSE (M-mode): 2.1 cm LEFT ATRIUM             Index        RIGHT ATRIUM           Index LA diam:        3.50 cm 1.89 cm/m   RA Area:     14.90 cm LA Vol (A2C):   44.6 ml 24.15 ml/m  RA Volume:   36.40 ml  19.71  ml/m LA Vol (A4C):   40.7 ml 22.03 ml/m LA Biplane Vol: 44.5 ml 24.09 ml/m  AORTIC VALVE                    PULMONIC VALVE AV Area (Vmax):    2.56 cm     PV Vmax:       0.49 m/s AV Area (Vmean):   2.59 cm     PV Vmean:      33.100 cm/s AV Area (VTI):     3.02 cm     PV VTI:        0.111 m AV Vmax:           124.00 cm/s  PV Peak grad:  1.0 mmHg AV Vmean:          78.700 cm/s  PV Mean grad:  0.0 mmHg AV VTI:            0.220 m AV Peak Grad:      6.2 mmHg AV Mean Grad:      3.0 mmHg LVOT Vmax:         83.60 cm/s LVOT Vmean:        53.600 cm/s LVOT VTI:          0.175 m LVOT/AV VTI ratio: 0.80  AORTA Ao Root diam: 3.00 cm MITRAL VALVE MV Area (PHT): 3.06 cm    SHUNTS MV Decel Time: 248 msec    Systemic VTI:  0.18 m MV E velocity: 49.30 cm/s  Systemic Diam: 2.20 cm MV A velocity: 50.60 cm/s MV E/A ratio:  0.97 Gwyndolyn Kaufman MD Electronically signed by Gwyndolyn Kaufman MD Signature Date/Time: 01/14/2022/5:07:08 PM    Final     Microbiology: Recent Results (from the past 240 hour(s))  Resp Panel by RT-PCR (Flu A&B, Covid) Nasopharyngeal Swab     Status: None   Collection Time: 01/14/22  2:25 AM   Specimen: Nasopharyngeal Swab; Nasopharyngeal(NP) swabs in vial transport medium  Result Value Ref Range Status   SARS Coronavirus 2 by RT PCR NEGATIVE NEGATIVE Final    Comment: (NOTE) SARS-CoV-2 target nucleic acids are NOT DETECTED.  The SARS-CoV-2 RNA is generally detectable in upper respiratory specimens during the acute phase of infection. The lowest concentration of SARS-CoV-2 viral copies this assay can detect is 138 copies/mL. A negative result does not preclude SARS-Cov-2 infection and should not be used as the sole basis for treatment or other patient management decisions. A negative result may occur with  improper specimen collection/handling, submission of specimen other than nasopharyngeal swab, presence of viral mutation(s) within the areas targeted by this assay, and inadequate number  of viral copies(<138 copies/mL). A negative result must be combined with clinical observations, patient history, and epidemiological information. The expected result is Negative.  Fact Sheet for Patients:  EntrepreneurPulse.com.au  Fact Sheet for Healthcare Providers:  IncredibleEmployment.be  This test is no t yet approved or cleared by the [place] and  has been authorized for detection and/or diagnosis of SARS-CoV-2 by FDA under an Emergency Use Authorization (EUA). This EUA will remain  in effect (meaning this test can be used) for the duration of the COVID-19 declaration under Section 564(b)(1) of the Act, 21 U.S.C.section 360bbb-3(b)(1), unless the authorization is terminated  or revoked sooner.       Influenza A by PCR NEGATIVE NEGATIVE Final   Influenza B by PCR NEGATIVE NEGATIVE Final    Comment: (NOTE) The Xpert Xpress SARS-CoV-2/FLU/RSV plus assay is intended as an aid in the diagnosis of influenza from Nasopharyngeal swab specimens and should not be used as a sole basis for treatment. Nasal washings and aspirates are unacceptable for Xpert Xpress SARS-CoV-2/FLU/RSV testing.  Fact Sheet for Patients: EntrepreneurPulse.com.au  Fact Sheet for Healthcare Providers: IncredibleEmployment.be  This test is not yet approved or cleared by the [place] FDA and has been authorized for detection and/or diagnosis of SARS-CoV-2 by FDA under an Emergency Use Authorization (EUA). This EUA will remain in effect (meaning this test can be used) for the duration of the COVID-19 declaration under Section 564(b)(1) of the Act, 21 U.S.C. section 360bbb-3(b)(1), unless the authorization is terminated or revoked.  Performed at [facility], [place] [address] [place], [place] [address]   Resp Panel by RT-PCR (Flu A&B, Covid) Nasopharyngeal Swab     Status: None   Collection Time: 01/14/22  5:03 PM    Specimen: Nasopharyngeal Swab; Nasopharyngeal(NP) swabs in vial transport medium  Result Value Ref Range Status   SARS Coronavirus 2 by RT PCR NEGATIVE NEGATIVE Final    Comment: (NOTE) SARS-CoV-2 target nucleic acids are NOT DETECTED.  The SARS-CoV-2 RNA is generally detectable in upper respiratory specimens during the acute phase of infection. The lowest concentration of SARS-CoV-2 viral copies this assay can detect is 138 copies/mL. A negative result does not preclude SARS-Cov-2 infection and should not be used as the sole basis for treatment or other patient management decisions. A negative result may occur with  improper specimen collection/handling, submission of specimen other than nasopharyngeal swab, presence of viral mutation(s) within the areas targeted by this assay, and inadequate number of viral copies(<138 copies/mL). A negative result must be combined with clinical observations, patient history, and epidemiological information. The expected result is Negative.  Fact Sheet for Patients:  EntrepreneurPulse.com.au  Fact Sheet for Healthcare Providers:  IncredibleEmployment.be  This test is no t yet approved or cleared by the [place] FDA and  has been authorized for detection and/or diagnosis of SARS-CoV-2 by FDA under an Emergency Use Authorization (EUA). This EUA will remain  in effect (meaning this test can be used) for the duration of the COVID-19 declaration under Section 564(b)(1) of the Act, 21 U.S.C.section 360bbb-3(b)(1), unless the authorization is terminated  or revoked sooner.       Influenza A by PCR NEGATIVE NEGATIVE Final   Influenza B by PCR NEGATIVE NEGATIVE Final    Comment: (NOTE) The Xpert Xpress SARS-CoV-2/FLU/RSV plus assay is intended as an aid in the diagnosis of influenza from Nasopharyngeal swab specimens and should not be used as a sole basis for treatment. Nasal washings and aspirates are  unacceptable for Xpert Xpress SARS-CoV-2/FLU/RSV testing.  Fact Sheet for Patients: EntrepreneurPulse.com.au  Fact Sheet for Healthcare Providers: IncredibleEmployment.be  This test is not yet approved or cleared by the [place] and has been authorized  for detection and/or diagnosis of SARS-CoV-2 by FDA under an Emergency Use Authorization (EUA). This EUA will remain in effect (meaning this test can be used) for the duration of the COVID-19 declaration under Section 564(b)(1) of the Act, 21 U.S.C. section 360bbb-3(b)(1), unless the authorization is terminated or revoked.  Performed at [facility], [place] [address], [place], [place] [address]      Labs: Basic Metabolic Panel: Recent Labs  Lab 01/14/22 0235 01/14/22 1122 01/16/22 0241  NA 136 141 138  K 3.7 3.6 4.0  CL 103 108 106  CO2 25 26 25   GLUCOSE 92 108* 100*  BUN 7 6 9   CREATININE 0.89 0.85 1.06  CALCIUM 9.0 8.7* 8.8*  MG  --   --  2.2  PHOS  --   --  4.3   Liver Function Tests: Recent Labs  Lab 01/16/22 0241 01/16/22 1122  AST 16 20  ALT 16 18  ALKPHOS 70 81  BILITOT 0.4 0.8  PROT 6.8 7.2  ALBUMIN 3.2* 3.5   No results for input(s): LIPASE, AMYLASE in the last 168 hours. No results for input(s): AMMONIA in the last 168 hours. CBC: Recent Labs  Lab 01/14/22 0235 01/14/22 1122 01/16/22 0241  WBC 4.2 2.8* 2.8*  NEUTROABS  --   --  1.0*  HGB 13.7 13.6 13.8  HCT 41.5 40.5 41.7  MCV 95.0 95.5 94.8  PLT 172 154 165   Cardiac Enzymes: No results for input(s): CKTOTAL, CKMB, CKMBINDEX, TROPONINI in the last 168 hours. BNP: BNP (last 3 results) No results for input(s): BNP in the last 8760 hours.  ProBNP (last 3 results) No results for input(s): PROBNP in the last 8760 hours.  CBG: Recent Labs  Lab 01/14/22 1119  GLUCAP 106*       Signed:  Irine Seal MD.  [facility] 01/16/2022, 3:27 PM
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
## 2022-01-16 NOTE — TOC Transition Note (Addendum)
Transition of Care (TOC) - CM/SW Discharge Note ? ? ?Patient Details  ?Name: Curtis Hodge ?MRN: [id number] ?Date of Birth: 1966/08/12 ? ?Transition of Care (TOC) CM/SW Contact:  ?Graves-Bigelow, Lamar Laundry, RN ?Phone Number: ?01/16/2022, 2:38 PM ? ? ?Clinical Narrative:  Case Manager spoke with the patient regarding home health needs. Patient is agreeable to services and has no agency preference. Case Manager called [facility] and they can service the patient at his sisters address. Start of care to begin within 24-48 hours post transition home. No further needs from Case Manager at this time.   ? ?01-16-22 1522 Appointment at the [facility] has been scheduled. Patient is aware that he needs to make this scheduled appointment. New address was placed on the AVS.  ? ?Final next level of care: Home w Home Health Services ?Barriers to Discharge: No Barriers Identified ? ? ?Patient Goals and CMS Choice ?Patient states their goals for this hospitalization and ongoing recovery are:: to return home ?  ?Choice offered to / list presented to : Patient (No preference.) ? ?  ?Discharge Plan and Services ?In-house Referral: Clinical Social Work ?Discharge Planning Services: CM Consult ?Post Acute Care Choice: Home Health          ?DME Arranged: N/A ?DME Agency: NA ?  ?  ?  ?HH Arranged: PT ?HH Agency: [facility] ?Date HH Agency Contacted: 01/16/22 ?Time HH Agency Contacted: 1400 ?Representative spoke with at [facility] Agency: Kandee Keen ? ? ?Readmission Risk Interventions ?No flowsheet data found. ? ? ? ? ?

## 2022-01-16 NOTE — Progress Notes (Signed)
Brief ID Note:  ? ?Alerted that Mr. Curtis Hodge is inpatient for eval and management of CP.  ? ?Has been a patient at [facility] in the past but not seen since 2020 when he was last seen by me during incarceration period.  ? ?Will schedule him with Dr. Renold Don on 01/25/2022 at 3:00 PM. **Mr. [name] needs to work with a male provider in my previous experience with him** ? ?Will add on labs to complete pre-labs for his visit next week in [facility] clinic including Hep b DNA, LFTs, Hep B eAg, Quantiferon, RPR, HIV RNA with reflex genosure including integrase testing.  ? ?Can continue Symtuza + Tivicay until next appt. His CD4 is > 200 so no need for bactrim prophylaxis.  ? ?Appt information has been added to D/C navigator and D/W Dr. Janee Morn.  ? ? ?[name], MSN, NP-C ?[facility] ?[facility]  ?[email or web address] ?Pager: [phone] ?Office: [phone] ?[facility] Main Line: [phone] ?*Secure Chat Communication Welcome  ?

## 2022-01-16 NOTE — Progress Notes (Signed)
Pt safely discharged. Discharge packet provided with teach-back method. VS wnL and as per flow. IVs removed, Pt verbalized understanding. All questions and concerns addressed. Taxi voucher given for transport. Pickup scheduled for now. ?
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
## 2022-01-16 NOTE — TOC Transition Note (Addendum)
Transition of Care (TOC) - CM/SW Discharge Note ? ? ?Patient Details  ?Name: Curtis Hodge ?MRN: [id number] ?Date of Birth: Oct 04, 1966 ? ?Transition of Care (TOC) CM/SW Contact:  ?Milas Gain, LCSWA ?Phone Number: ?01/16/2022, 3:09 PM ? ? ?Clinical Narrative:    ? ?Patient will DC to: [address] [place] [address] ? ?Anticipated DC date: 01/16/2022 ? ?Patient asked for CSW to call his sister and [name] at [facility] to let them know he is going to discharge. CSW let patient know she was unable to get a hold of melanie at [facility]. Patient informed CSW that he  did speak with [name]. ? ?Family notified: [name] and LVM for [name] at [facility]. ? ?Transport by: [name]  ? ? ? ?CSW signing off.  ? ?Final next level of care: [name] ?Barriers to Discharge: No Barriers Identified ? ? ?Patient Goals and CMS Choice ?Patient states their goals for this hospitalization and ongoing recovery are:: to return home ?  ?Choice offered to / list presented to : Patient (No preference.) ? ?Discharge Placement ?  ?           ?  ?  ?  ?  ? ?Discharge Plan and Services ?In-house Referral: Clinical Social Work ?Discharge Planning Services: CM Consult ?Post Acute Care Choice: Home Health          ?DME Arranged: N/A ?DME Agency: NA ?  ?  ?  ?HH Arranged: PT ?[place] Agency: [place] ?Date HH Agency Contacted: 01/16/22 ?Time [place]: 1400 ?Representative spoke with at [place]: [name] ? ?Social Determinants of Health (SDOH) Interventions ?  ? ? ?Readmission Risk Interventions ?No flowsheet data found. ? ? ? ? ?
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
## 2022-01-16 NOTE — Care Management (Signed)
01-16-22 1402 Case Manager spoke with patient this am and he reported that he is homeless. Patient is working with the [facility] to assist in getting him placed in a motel at some point. Case Manager did call the [facility] @ [phone] and left a detailed voicemail. Patient states he will go to his sisters home and stay with her. Case Manager did call sister and received voicemail. Case Manager verified that the patients Medicaid is active and he will be able to afford medications. Case Manager asked MD to send any new prescriptions to [facility]. Patient is aware that he needs to follow up with ID for 042 medications. CSW is following the patient as well for transportation needs.  ?

## 2022-01-17 ENCOUNTER — Inpatient Hospital Stay: Payer: Medicaid Other | Admitting: Family Medicine

## 2022-01-17 ENCOUNTER — Other Ambulatory Visit (HOSPITAL_COMMUNITY): Payer: Self-pay

## 2022-01-17 LAB — HEPATITIS B DNA, ULTRAQUANTITATIVE, PCR
HBV DNA SERPL PCR-ACNC: 138000 IU/mL
HBV DNA SERPL PCR-LOG IU: 5.14 log10 IU/mL

## 2022-01-17 LAB — HEPATITIS B E ANTIGEN: Hep B E Ag: POSITIVE — AB

## 2022-01-17 LAB — HIV-1 RNA QUANT-NO REFLEX-BLD
HIV 1 RNA Quant: 46400 copies/mL
LOG10 HIV-1 RNA: 4.667 log10copy/mL

## 2022-01-17 LAB — HEPATITIS C ANTIBODY: HCV Ab: NONREACTIVE — AB

## 2022-01-19 LAB — QUANTIFERON-TB GOLD PLUS (RQFGPL)
QuantiFERON Mitogen Value: 7.56 IU/mL
QuantiFERON Nil Value: 0.14 IU/mL
QuantiFERON TB1 Ag Value: 0.07 IU/mL
QuantiFERON TB2 Ag Value: 0.05 IU/mL

## 2022-01-19 LAB — QUANTIFERON-TB GOLD PLUS: QuantiFERON-TB Gold Plus: NEGATIVE

## 2022-01-24 ENCOUNTER — Other Ambulatory Visit (HOSPITAL_COMMUNITY): Payer: Self-pay

## 2022-01-24 LAB — HIV GENOSURE REFLEX - HIVGTY - ELECTRONIC RECORD

## 2022-01-24 LAB — GENOSURE INTEGRASE HIV EDI: HIV Genosure Integrase PDF 2: 1

## 2022-01-25 ENCOUNTER — Inpatient Hospital Stay: Payer: Medicaid Other | Admitting: Internal Medicine

## 2022-01-25 ENCOUNTER — Telehealth: Payer: Self-pay

## 2022-01-25 ENCOUNTER — Other Ambulatory Visit (HOSPITAL_COMMUNITY): Payer: Self-pay

## 2022-01-25 NOTE — Telephone Encounter (Signed)
Called patient to see if he would be able to make it to today's appointment, no answer and no secure voicemail. Did not leave message.  ? ?Sandie Ano, RN ? ?

## 2022-01-28 ENCOUNTER — Other Ambulatory Visit (HOSPITAL_COMMUNITY): Payer: Self-pay

## 2022-01-31 ENCOUNTER — Ambulatory Visit: Payer: Medicaid Other | Admitting: Infectious Diseases

## 2022-02-07 ENCOUNTER — Other Ambulatory Visit: Payer: Self-pay | Admitting: Pharmacist

## 2022-02-07 ENCOUNTER — Other Ambulatory Visit: Payer: Self-pay

## 2022-02-07 ENCOUNTER — Encounter: Payer: Self-pay | Admitting: Infectious Disease

## 2022-02-07 ENCOUNTER — Ambulatory Visit (INDEPENDENT_AMBULATORY_CARE_PROVIDER_SITE_OTHER): Payer: Medicaid Other | Admitting: Infectious Disease

## 2022-02-07 VITALS — BP 159/96 | HR 121 | Resp 16 | Ht 70.0 in | Wt 153.0 lb

## 2022-02-07 DIAGNOSIS — B181 Chronic viral hepatitis B without delta-agent: Secondary | ICD-10-CM

## 2022-02-07 DIAGNOSIS — Z5900 Homelessness unspecified: Secondary | ICD-10-CM

## 2022-02-07 DIAGNOSIS — B20 Human immunodeficiency virus [HIV] disease: Secondary | ICD-10-CM

## 2022-02-07 DIAGNOSIS — F17200 Nicotine dependence, unspecified, uncomplicated: Secondary | ICD-10-CM | POA: Diagnosis not present

## 2022-02-07 DIAGNOSIS — Z89431 Acquired absence of right foot: Secondary | ICD-10-CM | POA: Diagnosis not present

## 2022-02-07 DIAGNOSIS — R079 Chest pain, unspecified: Secondary | ICD-10-CM

## 2022-02-07 MED ORDER — BICTEGRAVIR-EMTRICITAB-TENOFOV 50-200-25 MG PO TABS: 1.0000 | ORAL_TABLET | Freq: Every day | ORAL | 0 refills | Status: AC

## 2022-02-07 MED ORDER — BIKTARVY 50-200-25 MG PO TABS: 1.0000 | ORAL_TABLET | Freq: Every day | ORAL | 11 refills | Status: DC
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
## 2022-02-07 NOTE — Progress Notes (Signed)
Medication Samples have been provided to the patient. ? ?Drug name: Biktarvy        ?Strength: 50/200/25 mg       ?Qty: 28 tablets (4 bottles) ?LOT: [id number]   ?Exp.Date: 10/24 ? ?Dosing instructions: Take one tablet by mouth once daily ? ?The patient has been instructed regarding the correct time, dose, and frequency of taking this medication, including desired effects and most common side effects.  ? ?Egor Fullilove, PharmD, CPP ?Clinical Pharmacist Practitioner ?Infectious Diseases Clinical Pharmacist ?[facility] ? ?

## 2022-02-07 NOTE — Addendum Note (Signed)
Addended by: [place] Artist A on: 02/07/2022 11:02 AM ? ? Modules accepted: Orders ? ?

## 2022-02-07 NOTE — Addendum Note (Signed)
Addended by: [place] Artist A on: 02/07/2022 10:54 AM ? ? Modules accepted: Orders ? ?

## 2022-02-08 LAB — T-HELPER CELLS (CD4) COUNT (NOT AT ARMC)
CD4 % Helper T Cell: 19 % — ABNORMAL LOW (ref 33–65)
CD4 T Cell Abs: 166 /uL — ABNORMAL LOW (ref 400–1790)

## 2022-02-11 ENCOUNTER — Other Ambulatory Visit: Payer: Self-pay

## 2022-02-11 ENCOUNTER — Telehealth: Payer: Self-pay

## 2022-02-11 ENCOUNTER — Other Ambulatory Visit: Payer: Self-pay | Admitting: Infectious Disease

## 2022-02-11 ENCOUNTER — Other Ambulatory Visit (HOSPITAL_COMMUNITY): Payer: Self-pay

## 2022-02-11 MED ORDER — SULFAMETHOXAZOLE-TRIMETHOPRIM 800-160 MG PO TABS: 1.0000 | ORAL_TABLET | Freq: Every day | ORAL | 5 refills | Status: DC

## 2022-02-11 MED ORDER — SULFAMETHOXAZOLE-TRIMETHOPRIM 800-160 MG PO TABS
1.0000 | ORAL_TABLET | Freq: Every day | ORAL | 5 refills | Status: DC
  Filled 2022-02-11: qty 30, 30d supply, fill #0

## 2022-02-11 NOTE — Telephone Encounter (Signed)
Attempted to call patient regarding CD4 count. Patient answered call but call was dropped before results could be relayed. Attempted to call again, but call was forwarded to voicemail.  ?Juanita Laster, RMA ? ?

## 2022-02-11 NOTE — Telephone Encounter (Signed)
-----   Message from Randall Hiss, MD sent at 02/11/2022  8:38 AM EDT ----- ?Curtis Hodge CD4 is lower than 200 again if he can make it to [facility] I have sent bactrim there for him ?----- Message ----- ?From: Interface, [facility] Results In ?Sent: 02/07/2022   3:31 PM EDT ?To: Randall Hiss, MD ? ? ?

## 2022-02-11 NOTE — Telephone Encounter (Signed)
Patient aware of results. Bactrim will be sent to [facility] and transferred to our office due to patient being homeless and unable to make it to [facility]. ? ? ? ?Curtis Hodge Curtis Hodge, CMA ? ?

## 2022-02-12 ENCOUNTER — Other Ambulatory Visit (HOSPITAL_COMMUNITY): Payer: Self-pay

## 2022-02-12 ENCOUNTER — Telehealth: Payer: Self-pay

## 2022-02-12 NOTE — Telephone Encounter (Signed)
Patient came in to pick up medication from our pharmacist, and is requesting Dr. Zenaida Niece Dams nurse to call [phone] Curtis Hodge a analyst at social security office that is handling his case about disability . York Spaniel he is needing some information about some labs he needs to get, if you have any questions best contact number is  [phone] ?

## 2022-02-12 NOTE — Telephone Encounter (Signed)
Called [facility], no answer. Left voicemail requesting callback.  ? ?Beryle Flock, RN ? ?

## 2022-02-12 NOTE — Telephone Encounter (Signed)
[facility] Patient Advocate Encounter ? ?Patient's medications have been couriered to [facility] from [facility] and will be picked up 02/13/22. ? ?Nicle Connole , CPhT ?Specialty Pharmacy Patient Advocate ?[facility] ?Phone: [phone] ?Fax:  [phone]  ?

## 2022-02-13 NOTE — Telephone Encounter (Signed)
Curtis Hodge called stating they were needing medical records from our office. I provided him with the number for medical records and the fax number.  ?

## 2022-02-14 ENCOUNTER — Ambulatory Visit: Payer: Medicaid Other | Attending: Family Medicine | Admitting: Family Medicine

## 2022-02-14 ENCOUNTER — Other Ambulatory Visit: Payer: Self-pay

## 2022-02-14 ENCOUNTER — Telehealth: Payer: Self-pay | Admitting: Family Medicine

## 2022-02-14 VITALS — BP 137/89 | HR 84 | Ht 70.0 in | Wt 154.6 lb

## 2022-02-14 DIAGNOSIS — Z5900 Homelessness unspecified: Secondary | ICD-10-CM | POA: Diagnosis not present

## 2022-02-14 DIAGNOSIS — F329 Major depressive disorder, single episode, unspecified: Secondary | ICD-10-CM | POA: Diagnosis not present

## 2022-02-14 DIAGNOSIS — F419 Anxiety disorder, unspecified: Secondary | ICD-10-CM | POA: Insufficient documentation

## 2022-02-14 DIAGNOSIS — Z5902 Unsheltered homelessness: Secondary | ICD-10-CM | POA: Diagnosis not present

## 2022-02-14 DIAGNOSIS — I2510 Atherosclerotic heart disease of native coronary artery without angina pectoris: Secondary | ICD-10-CM | POA: Insufficient documentation

## 2022-02-14 DIAGNOSIS — J439 Emphysema, unspecified: Secondary | ICD-10-CM | POA: Insufficient documentation

## 2022-02-14 DIAGNOSIS — Z89431 Acquired absence of right foot: Secondary | ICD-10-CM | POA: Diagnosis not present

## 2022-02-14 DIAGNOSIS — B20 Human immunodeficiency virus [HIV] disease: Secondary | ICD-10-CM | POA: Diagnosis not present

## 2022-02-14 DIAGNOSIS — F1910 Other psychoactive substance abuse, uncomplicated: Secondary | ICD-10-CM | POA: Insufficient documentation

## 2022-02-14 DIAGNOSIS — R9431 Abnormal electrocardiogram [ECG] [EKG]: Secondary | ICD-10-CM | POA: Insufficient documentation

## 2022-02-14 MED ORDER — ALBUTEROL SULFATE HFA 108 (90 BASE) MCG/ACT IN AERS
2.0000 | INHALATION_SPRAY | Freq: Four times a day (QID) | RESPIRATORY_TRACT | 2 refills | Status: DC | PRN
  Filled 2022-02-14: qty 8, fill #0

## 2022-02-14 MED ORDER — ALBUTEROL SULFATE HFA 108 (90 BASE) MCG/ACT IN AERS
2.0000 | INHALATION_SPRAY | Freq: Four times a day (QID) | RESPIRATORY_TRACT | 2 refills | Status: DC | PRN
  Filled 2022-02-14: qty 18, 25d supply, fill #0

## 2022-02-14 NOTE — Telephone Encounter (Signed)
Can you please assist him in getting connected to behavioral health history of anxiety and depression, schizophrenia?  Thank you ?
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
## 2022-02-14 NOTE — Patient Instructions (Signed)

## 2022-02-18 ENCOUNTER — Ambulatory Visit: Payer: Medicaid Other | Admitting: Infectious Diseases

## 2022-02-18 ENCOUNTER — Encounter: Payer: Self-pay | Admitting: Orthopedic Surgery

## 2022-02-18 ENCOUNTER — Ambulatory Visit (INDEPENDENT_AMBULATORY_CARE_PROVIDER_SITE_OTHER): Payer: Medicaid Other | Admitting: Orthopedic Surgery

## 2022-02-18 ENCOUNTER — Ambulatory Visit: Payer: Medicaid Other | Admitting: Orthopedic Surgery

## 2022-02-18 DIAGNOSIS — Z89431 Acquired absence of right foot: Secondary | ICD-10-CM | POA: Diagnosis not present
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
## 2022-02-22 LAB — HIV-1 INTEGRASE GENOTYPE

## 2022-02-22 LAB — CBC WITH DIFFERENTIAL/PLATELET
Absolute Monocytes: 252 cells/uL (ref 200–950)
Basophils Absolute: 8 cells/uL (ref 0–200)
Basophils Relative: 0.2 %
Eosinophils Absolute: 0 cells/uL — ABNORMAL LOW (ref 15–500)
Eosinophils Relative: 0 %
HCT: 40.1 % (ref 38.5–50.0)
Hemoglobin: 13.7 g/dL (ref 13.2–17.1)
Lymphs Abs: 1113 cells/uL (ref 850–3900)
MCH: 31.6 pg (ref 27.0–33.0)
MCHC: 34.2 g/dL (ref 32.0–36.0)
MCV: 92.6 fL (ref 80.0–100.0)
MPV: 11.4 fL (ref 7.5–12.5)
Monocytes Relative: 6 %
Neutro Abs: 2827 cells/uL (ref 1500–7800)
Neutrophils Relative %: 67.3 %
Platelets: 171 10*3/uL (ref 140–400)
RBC: 4.33 10*6/uL (ref 4.20–5.80)
RDW: 12.3 % (ref 11.0–15.0)
Total Lymphocyte: 26.5 %
WBC: 4.2 10*3/uL (ref 3.8–10.8)

## 2022-02-22 LAB — COMPLETE METABOLIC PANEL WITH GFR
AG Ratio: 1.2 (calc) (ref 1.0–2.5)
ALT: 29 U/L (ref 9–46)
AST: 25 U/L (ref 10–35)
Albumin: 4.3 g/dL (ref 3.6–5.1)
Alkaline phosphatase (APISO): 87 U/L (ref 35–144)
BUN: 9 mg/dL (ref 7–25)
CO2: 28 mmol/L (ref 20–32)
Calcium: 9.2 mg/dL (ref 8.6–10.3)
Chloride: 104 mmol/L (ref 98–110)
Creat: 0.91 mg/dL (ref 0.70–1.30)
Globulin: 3.7 g/dL (calc) (ref 1.9–3.7)
Glucose, Bld: 81 mg/dL (ref 65–99)
Potassium: 4.2 mmol/L (ref 3.5–5.3)
Sodium: 141 mmol/L (ref 135–146)
Total Bilirubin: 0.5 mg/dL (ref 0.2–1.2)
Total Protein: 8 g/dL (ref 6.1–8.1)
eGFR: 100 mL/min/{1.73_m2} (ref >=60)

## 2022-02-22 LAB — RPR: RPR Ser Ql: NONREACTIVE

## 2022-02-22 LAB — HIV RNA, RTPCR W/R GT (RTI, PI,INT)
HIV 1 RNA Quant: 175000 copies/mL — ABNORMAL HIGH
HIV-1 RNA Quant, Log: 5.24 Log copies/mL — ABNORMAL HIGH

## 2022-02-22 LAB — MAGNESIUM: Magnesium: 2.2 mg/dL (ref 1.5–2.5)

## 2022-02-22 LAB — HIV-1 GENOTYPE: HIV-1 Genotype: DETECTED — AB

## 2022-03-07 ENCOUNTER — Other Ambulatory Visit: Payer: Self-pay

## 2022-03-07 ENCOUNTER — Ambulatory Visit (INDEPENDENT_AMBULATORY_CARE_PROVIDER_SITE_OTHER): Payer: Medicaid Other | Admitting: Infectious Disease

## 2022-03-07 ENCOUNTER — Encounter: Payer: Self-pay | Admitting: Infectious Disease

## 2022-03-07 VITALS — BP 140/83 | HR 93 | Temp 98.2°F | Ht 70.0 in | Wt 151.0 lb

## 2022-03-07 DIAGNOSIS — F17200 Nicotine dependence, unspecified, uncomplicated: Secondary | ICD-10-CM

## 2022-03-07 DIAGNOSIS — J439 Emphysema, unspecified: Secondary | ICD-10-CM | POA: Diagnosis not present

## 2022-03-07 DIAGNOSIS — Z5900 Homelessness unspecified: Secondary | ICD-10-CM | POA: Diagnosis not present

## 2022-03-07 DIAGNOSIS — B181 Chronic viral hepatitis B without delta-agent: Secondary | ICD-10-CM | POA: Diagnosis not present

## 2022-03-07 DIAGNOSIS — Z89431 Acquired absence of right foot: Secondary | ICD-10-CM | POA: Diagnosis not present

## 2022-03-07 DIAGNOSIS — F1910 Other psychoactive substance abuse, uncomplicated: Secondary | ICD-10-CM | POA: Diagnosis not present

## 2022-03-07 DIAGNOSIS — B20 Human immunodeficiency virus [HIV] disease: Secondary | ICD-10-CM | POA: Diagnosis not present
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
## 2022-03-15 ENCOUNTER — Encounter: Payer: Self-pay | Admitting: Clinical

## 2022-03-15 ENCOUNTER — Telehealth: Payer: Self-pay

## 2022-03-15 NOTE — Telephone Encounter (Signed)
Returned phone call from [facility] ?

## 2022-03-15 NOTE — Telephone Encounter (Signed)
I attempted to call pt, no answer, left detailed vm. I sent pt behavioral health resources in the mail.  ?

## 2022-03-15 NOTE — Telephone Encounter (Signed)
I attempted to call pt again, no answer, left vm informing him that I sent counseling resources in the mail. ?

## 2022-05-24 ENCOUNTER — Other Ambulatory Visit (HOSPITAL_COMMUNITY): Payer: Self-pay

## 2022-05-28 ENCOUNTER — Other Ambulatory Visit: Payer: Self-pay

## 2022-05-28 ENCOUNTER — Ambulatory Visit: Payer: Medicaid Other | Admitting: Physician Assistant

## 2022-05-28 ENCOUNTER — Encounter: Payer: Self-pay | Admitting: Physician Assistant

## 2022-05-28 VITALS — BP 132/83 | HR 69 | Resp 18 | Ht 70.0 in | Wt 160.0 lb

## 2022-05-28 DIAGNOSIS — E785 Hyperlipidemia, unspecified: Secondary | ICD-10-CM | POA: Diagnosis not present

## 2022-05-28 DIAGNOSIS — F1510 Other stimulant abuse, uncomplicated: Secondary | ICD-10-CM

## 2022-05-28 DIAGNOSIS — Z89431 Acquired absence of right foot: Secondary | ICD-10-CM

## 2022-05-28 DIAGNOSIS — B20 Human immunodeficiency virus [HIV] disease: Secondary | ICD-10-CM

## 2022-05-28 DIAGNOSIS — F1410 Cocaine abuse, uncomplicated: Secondary | ICD-10-CM

## 2022-05-28 DIAGNOSIS — I10 Essential (primary) hypertension: Secondary | ICD-10-CM | POA: Diagnosis not present

## 2022-05-28 DIAGNOSIS — F1220 Cannabis dependence, uncomplicated: Secondary | ICD-10-CM

## 2022-05-28 DIAGNOSIS — J439 Emphysema, unspecified: Secondary | ICD-10-CM

## 2022-05-28 DIAGNOSIS — F1210 Cannabis abuse, uncomplicated: Secondary | ICD-10-CM

## 2022-05-28 DIAGNOSIS — H269 Unspecified cataract: Secondary | ICD-10-CM

## 2022-05-28 DIAGNOSIS — F4310 Post-traumatic stress disorder, unspecified: Secondary | ICD-10-CM

## 2022-05-28 DIAGNOSIS — F209 Schizophrenia, unspecified: Secondary | ICD-10-CM

## 2022-05-28 DIAGNOSIS — F3341 Major depressive disorder, recurrent, in partial remission: Secondary | ICD-10-CM

## 2022-05-28 MED ORDER — ALBUTEROL SULFATE HFA 108 (90 BASE) MCG/ACT IN AERS: 2.0000 | INHALATION_SPRAY | Freq: Four times a day (QID) | RESPIRATORY_TRACT | 1 refills | Status: DC | PRN

## 2022-05-28 MED ORDER — ALVESCO 80 MCG/ACT IN AERS
1.0000 | INHALATION_SPRAY | Freq: Two times a day (BID) | RESPIRATORY_TRACT | 1 refills | Status: DC | PRN
  Filled 2022-05-28: qty 6.1, 30d supply, fill #0

## 2022-05-28 MED ORDER — SPIRIVA HANDIHALER 18 MCG IN CAPS: 18.0000 ug | ORAL_CAPSULE | Freq: Every day | RESPIRATORY_TRACT | 1 refills | Status: DC

## 2022-05-28 MED ORDER — SERTRALINE HCL 50 MG PO TABS
50.0000 mg | ORAL_TABLET | Freq: Every day | ORAL | 1 refills | Status: DC
  Filled 2022-05-28: qty 30, 30d supply, fill #0

## 2022-05-28 MED ORDER — ALVESCO 80 MCG/ACT IN AERS: 1.0000 | INHALATION_SPRAY | Freq: Two times a day (BID) | RESPIRATORY_TRACT | 1 refills | Status: DC | PRN

## 2022-05-28 MED ORDER — CARVEDILOL 3.125 MG PO TABS
3.1250 mg | ORAL_TABLET | Freq: Two times a day (BID) | ORAL | 1 refills | Status: DC
  Filled 2022-05-28: qty 60, 30d supply, fill #0

## 2022-05-28 MED ORDER — HALOPERIDOL 5 MG PO TABS: 5.0000 mg | ORAL_TABLET | Freq: Every day | ORAL | 1 refills | Status: DC

## 2022-05-28 MED ORDER — SERTRALINE HCL 50 MG PO TABS: 50.0000 mg | ORAL_TABLET | Freq: Every day | ORAL | 1 refills | Status: DC

## 2022-05-28 MED ORDER — HYDROXYZINE HCL 25 MG PO TABS: 25.0000 mg | ORAL_TABLET | Freq: Three times a day (TID) | ORAL | 1 refills | Status: DC | PRN

## 2022-05-28 MED ORDER — GABAPENTIN 300 MG PO CAPS
300.0000 mg | ORAL_CAPSULE | Freq: Three times a day (TID) | ORAL | 1 refills | Status: DC
  Filled 2022-05-28: qty 90, 30d supply, fill #0

## 2022-05-28 MED ORDER — ATORVASTATIN CALCIUM 40 MG PO TABS: 40.0000 mg | ORAL_TABLET | Freq: Every day | ORAL | 1 refills | Status: DC

## 2022-05-28 MED ORDER — HYDROXYZINE HCL 25 MG PO TABS
25.0000 mg | ORAL_TABLET | Freq: Three times a day (TID) | ORAL | 1 refills | Status: DC | PRN
  Filled 2022-05-28: qty 60, 20d supply, fill #0

## 2022-05-28 MED ORDER — HALOPERIDOL 5 MG PO TABS
5.0000 mg | ORAL_TABLET | Freq: Every day | ORAL | 1 refills | Status: DC
  Filled 2022-05-28: qty 30, 30d supply, fill #0

## 2022-05-28 MED ORDER — SPIRIVA HANDIHALER 18 MCG IN CAPS
18.0000 ug | ORAL_CAPSULE | Freq: Every day | RESPIRATORY_TRACT | 1 refills | Status: DC
  Filled 2022-05-28: qty 30, 30d supply, fill #0

## 2022-05-28 MED ORDER — CARVEDILOL 3.125 MG PO TABS: 3.1250 mg | ORAL_TABLET | Freq: Two times a day (BID) | ORAL | 1 refills | Status: DC

## 2022-05-28 MED ORDER — TRAZODONE HCL 100 MG PO TABS
100.0000 mg | ORAL_TABLET | Freq: Every day | ORAL | 1 refills | Status: DC
  Filled 2022-05-28: qty 30, 30d supply, fill #0

## 2022-05-28 MED ORDER — ALBUTEROL SULFATE HFA 108 (90 BASE) MCG/ACT IN AERS
2.0000 | INHALATION_SPRAY | Freq: Four times a day (QID) | RESPIRATORY_TRACT | 1 refills | Status: DC | PRN
  Filled 2022-05-28: qty 18, 25d supply, fill #0

## 2022-05-28 MED ORDER — GABAPENTIN 300 MG PO CAPS: 300.0000 mg | ORAL_CAPSULE | Freq: Three times a day (TID) | ORAL | 1 refills | Status: DC

## 2022-05-28 MED ORDER — ATORVASTATIN CALCIUM 40 MG PO TABS
40.0000 mg | ORAL_TABLET | Freq: Every day | ORAL | 1 refills | Status: DC
  Filled 2022-05-28: qty 30, 30d supply, fill #0
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
## 2022-05-28 NOTE — Patient Instructions (Signed)
I encourage you to make follow-up appointments with infectious disease and Ortho care.  Please let us know if there is anything else we can do for you.  Roney Jaffe, PA-C Physician Assistant [facility] [email or web address]   Health Maintenance, Male Adopting a healthy lifestyle and getting preventive care are important in promoting health and wellness. Ask your health care provider about: The right schedule for you to have regular tests and exams. Things you can do on your own to prevent diseases and keep yourself healthy. What should I know about diet, weight, and exercise? Eat a healthy diet  Eat a diet that includes plenty of vegetables, fruits, low-fat dairy products, and lean protein. Do not eat a lot of foods that are high in solid fats, added sugars, or sodium. Maintain a healthy weight Body mass index (BMI) is a measurement that can be used to identify possible weight problems. It estimates body fat based on height and weight. Your health care provider can help determine your BMI and help you achieve or maintain a healthy weight. Get regular exercise Get regular exercise. This is one of the most important things you can do for your health. Most adults should: Exercise for at least 150 minutes each week. The exercise should increase your heart rate and make you sweat (moderate-intensity exercise). Do strengthening exercises at least twice a week. This is in addition to the moderate-intensity exercise. Spend less time sitting. Even light physical activity can be beneficial. Watch cholesterol and blood lipids Have your blood tested for lipids and cholesterol at 55 years of age, then have this test every 5 years. You may need to have your cholesterol levels checked more often if: Your lipid or cholesterol levels are high. You are older than 55 years of age. You are at high risk for heart disease. What should I know about  cancer screening? Many types of cancers can be detected early and may often be prevented. Depending on your health history and family history, you may need to have cancer screening at various ages. This may include screening for: Colorectal cancer. Prostate cancer. Skin cancer. Lung cancer. What should I know about heart disease, diabetes, and high blood pressure? Blood pressure and heart disease High blood pressure causes heart disease and increases the risk of stroke. This is more likely to develop in people who have high blood pressure readings or are overweight. Talk with your health care provider about your target blood pressure readings. Have your blood pressure checked: Every 3-5 years if you are 59-48 years of age. Every year if you are 47 years old or older. If you are between the ages of 74 and 7 and are a current or former smoker, ask your health care provider if you should have a one-time screening for abdominal aortic aneurysm (AAA). Diabetes Have regular diabetes screenings. This checks your fasting blood sugar level. Have the screening done: Once every three years after age 65 if you are at a normal weight and have a low risk for diabetes. More often and at a younger age if you are overweight or have a high risk for diabetes. What should I know about preventing infection? Hepatitis B If you have a higher risk for hepatitis B, you should be screened for this virus. Talk with your health care provider to find out if you are at risk for hepatitis B infection. Hepatitis C Blood testing is recommended for: Everyone born from [birth year] through 1964. Anyone with known risk factors  for hepatitis C. Sexually transmitted infections (STIs) You should be screened each year for STIs, including gonorrhea and chlamydia, if: You are sexually active and are younger than 55 years of age. You are older than 55 years of age and your health care provider tells you that you are at risk for this type  of infection. Your sexual activity has changed since you were last screened, and you are at increased risk for chlamydia or gonorrhea. Ask your health care provider if you are at risk. Ask your health care provider about whether you are at high risk for HIV. Your health care provider may recommend a prescription medicine to help prevent HIV infection. If you choose to take medicine to prevent HIV, you should first get tested for HIV. You should then be tested every 3 months for as long as you are taking the medicine. Follow these instructions at home: Alcohol use Do not drink alcohol if your health care provider tells you not to drink. If you drink alcohol: Limit how much you have to 0-2 drinks a day. Know how much alcohol is in your drink. In the [place] one drink equals one 12 oz bottle of beer (355 mL), one 5 oz glass of wine (148 mL), or one 1 oz glass of hard liquor (44 mL). Lifestyle Do not use any products that contain nicotine or tobacco. These products include cigarettes, chewing tobacco, and vaping devices, such as e-cigarettes. If you need help quitting, ask your health care provider. Do not use street drugs. Do not share needles. Ask your health care provider for help if you need support or information about quitting drugs. General instructions Schedule regular health, dental, and eye exams. Stay current with your vaccines. Tell your health care provider if: You often feel depressed. You have ever been abused or do not feel safe at home. Summary Adopting a healthy lifestyle and getting preventive care are important in promoting health and wellness. Follow your health care provider's instructions about healthy diet, exercising, and getting tested or screened for diseases. Follow your health care provider's instructions on monitoring your cholesterol and blood pressure. This information is not intended to replace advice given to you by your health care provider. Make sure you discuss  any questions you have with your health care provider. Document Revised: 03/26/2021 Document Reviewed: 03/26/2021 Elsevier Patient Education  2023 [employer].

## 2022-05-29 DIAGNOSIS — I10 Essential (primary) hypertension: Secondary | ICD-10-CM | POA: Insufficient documentation

## 2022-05-29 DIAGNOSIS — F209 Schizophrenia, unspecified: Secondary | ICD-10-CM | POA: Insufficient documentation

## 2022-05-29 DIAGNOSIS — F1410 Cocaine abuse, uncomplicated: Secondary | ICD-10-CM | POA: Insufficient documentation

## 2022-05-29 DIAGNOSIS — F4310 Post-traumatic stress disorder, unspecified: Secondary | ICD-10-CM | POA: Insufficient documentation

## 2022-05-29 DIAGNOSIS — F1510 Other stimulant abuse, uncomplicated: Secondary | ICD-10-CM | POA: Insufficient documentation

## 2022-05-29 DIAGNOSIS — E785 Hyperlipidemia, unspecified: Secondary | ICD-10-CM | POA: Insufficient documentation

## 2022-06-04 ENCOUNTER — Telehealth: Payer: Self-pay

## 2022-06-04 DIAGNOSIS — B20 Human immunodeficiency virus [HIV] disease: Secondary | ICD-10-CM

## 2022-06-04 MED ORDER — SULFAMETHOXAZOLE-TRIMETHOPRIM 800-160 MG PO TABS: 1.0000 | ORAL_TABLET | Freq: Every day | ORAL | 5 refills | Status: DC

## 2022-06-04 NOTE — Telephone Encounter (Signed)
Received call from Philis, Rn with [facility] to confirm patient medication. 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 Informed RN that patient is only supposed to be taking Biktarvy with Bactrim DS.  RN does not have that prescription and would need refills sent to [facility] in [facility]. Would also like last office note faxed to them. Rn states that patient will be transitioning to long term living facility next week. Is scheduled to see pharmacy team this Thursday for overdue appt.  Juanita Laster, RMA

## 2022-06-06 ENCOUNTER — Ambulatory Visit (INDEPENDENT_AMBULATORY_CARE_PROVIDER_SITE_OTHER): Payer: Medicaid Other | Admitting: Pharmacist

## 2022-06-06 ENCOUNTER — Other Ambulatory Visit: Payer: Self-pay

## 2022-06-06 DIAGNOSIS — B20 Human immunodeficiency virus [HIV] disease: Secondary | ICD-10-CM | POA: Diagnosis not present

## 2022-06-06 MED ORDER — SULFAMETHOXAZOLE-TRIMETHOPRIM 800-160 MG PO TABS: 1.0000 | ORAL_TABLET | Freq: Every day | ORAL | 5 refills | Status: DC
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
## 2022-06-07 LAB — T-HELPER CELLS (CD4) COUNT (NOT AT ARMC)
CD4 % Helper T Cell: 17 % — ABNORMAL LOW (ref 33–65)
CD4 T Cell Abs: 303 /uL — ABNORMAL LOW (ref 400–1790)

## 2022-06-10 LAB — HIV-1 RNA QUANT-NO REFLEX-BLD
HIV 1 RNA Quant: 360 Copies/mL — ABNORMAL HIGH
HIV-1 RNA Quant, Log: 2.56 Log cps/mL — ABNORMAL HIGH

## 2022-07-16 ENCOUNTER — Ambulatory Visit (INDEPENDENT_AMBULATORY_CARE_PROVIDER_SITE_OTHER): Payer: Medicaid Other | Admitting: Orthopedic Surgery

## 2022-07-16 DIAGNOSIS — Z89431 Acquired absence of right foot: Secondary | ICD-10-CM

## 2022-07-31 ENCOUNTER — Encounter: Payer: Self-pay | Admitting: Orthopedic Surgery
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
## 2022-08-07 ENCOUNTER — Telehealth: Payer: Self-pay

## 2022-08-07 ENCOUNTER — Ambulatory Visit: Payer: Medicaid Other | Admitting: Infectious Disease

## 2022-08-07 NOTE — Telephone Encounter (Signed)
Called patient to reschedule missed appointment. No answer. Unable to leave message, "call unable to be completed at this time".  Binnie Kand, RN

## 2022-10-22 ENCOUNTER — Telehealth: Payer: Self-pay

## 2022-10-22 NOTE — Telephone Encounter (Signed)
Detectable Viral Load Intervention   Most recent VL:  Lab Results  Component Value Date   HIV1RNAQUANT 360 (H) 06/06/2022     Current ART regimen: Biktarvy/Bactrim  Appointment status: patient does not have future appointment scheduled   Called patient to discuss medication adherence and possible barriers to care. Staff not able to reach at this time.    Interventions:  Will discuss with [facility] at viral load meeting for referral to state bridge counselor as states has not bee about to reach patient.   Valarie Cones, LPN

## 2022-11-01 ENCOUNTER — Encounter (HOSPITAL_COMMUNITY): Payer: Self-pay | Admitting: Emergency Medicine

## 2022-11-01 ENCOUNTER — Emergency Department (HOSPITAL_COMMUNITY): Payer: Medicaid Other

## 2022-11-01 ENCOUNTER — Inpatient Hospital Stay (HOSPITAL_COMMUNITY)
Admission: EM | Admit: 2022-11-01 | Discharge: 2022-11-08 | DRG: 917 | Disposition: A | Discharge status: Home / Self Care | Payer: Medicaid Other | Attending: Internal Medicine | Admitting: Internal Medicine

## 2022-11-01 DIAGNOSIS — J690 Pneumonitis due to inhalation of food and vomit: Secondary | ICD-10-CM | POA: Diagnosis present

## 2022-11-01 DIAGNOSIS — J439 Emphysema, unspecified: Secondary | ICD-10-CM | POA: Diagnosis present

## 2022-11-01 DIAGNOSIS — R509 Fever, unspecified: Secondary | ICD-10-CM | POA: Diagnosis present

## 2022-11-01 DIAGNOSIS — Z89431 Acquired absence of right foot: Secondary | ICD-10-CM

## 2022-11-01 DIAGNOSIS — B957 Other staphylococcus as the cause of diseases classified elsewhere: Secondary | ICD-10-CM | POA: Diagnosis present

## 2022-11-01 DIAGNOSIS — R4182 Altered mental status, unspecified: Principal | ICD-10-CM

## 2022-11-01 DIAGNOSIS — F1210 Cannabis abuse, uncomplicated: Secondary | ICD-10-CM | POA: Diagnosis present

## 2022-11-01 DIAGNOSIS — Z89411 Acquired absence of right great toe: Secondary | ICD-10-CM

## 2022-11-01 DIAGNOSIS — T405X1A Poisoning by cocaine, accidental (unintentional), initial encounter: Principal | ICD-10-CM | POA: Diagnosis present

## 2022-11-01 DIAGNOSIS — N179 Acute kidney failure, unspecified: Secondary | ICD-10-CM | POA: Diagnosis present

## 2022-11-01 DIAGNOSIS — F1410 Cocaine abuse, uncomplicated: Secondary | ICD-10-CM | POA: Diagnosis present

## 2022-11-01 DIAGNOSIS — G928 Other toxic encephalopathy: Secondary | ICD-10-CM | POA: Diagnosis present

## 2022-11-01 DIAGNOSIS — T40711A Poisoning by cannabis, accidental (unintentional), initial encounter: Secondary | ICD-10-CM | POA: Diagnosis present

## 2022-11-01 DIAGNOSIS — E8729 Other acidosis: Secondary | ICD-10-CM | POA: Diagnosis present

## 2022-11-01 DIAGNOSIS — F32A Depression, unspecified: Secondary | ICD-10-CM | POA: Diagnosis present

## 2022-11-01 DIAGNOSIS — Z89021 Acquired absence of right finger(s): Secondary | ICD-10-CM

## 2022-11-01 DIAGNOSIS — Z1152 Encounter for screening for COVID-19: Secondary | ICD-10-CM

## 2022-11-01 DIAGNOSIS — J9602 Acute respiratory failure with hypercapnia: Secondary | ICD-10-CM | POA: Diagnosis present

## 2022-11-01 DIAGNOSIS — N390 Urinary tract infection, site not specified: Secondary | ICD-10-CM | POA: Diagnosis present

## 2022-11-01 DIAGNOSIS — Z87442 Personal history of urinary calculi: Secondary | ICD-10-CM

## 2022-11-01 DIAGNOSIS — I10 Essential (primary) hypertension: Secondary | ICD-10-CM | POA: Diagnosis present

## 2022-11-01 DIAGNOSIS — Z7982 Long term (current) use of aspirin: Secondary | ICD-10-CM

## 2022-11-01 DIAGNOSIS — Z79899 Other long term (current) drug therapy: Secondary | ICD-10-CM

## 2022-11-01 DIAGNOSIS — G9341 Metabolic encephalopathy: Secondary | ICD-10-CM

## 2022-11-01 DIAGNOSIS — Z89421 Acquired absence of other right toe(s): Secondary | ICD-10-CM

## 2022-11-01 DIAGNOSIS — F17210 Nicotine dependence, cigarettes, uncomplicated: Secondary | ICD-10-CM | POA: Diagnosis present

## 2022-11-01 DIAGNOSIS — Z5900 Homelessness unspecified: Secondary | ICD-10-CM

## 2022-11-01 DIAGNOSIS — J9601 Acute respiratory failure with hypoxia: Secondary | ICD-10-CM | POA: Diagnosis present

## 2022-11-01 DIAGNOSIS — R319 Hematuria, unspecified: Secondary | ICD-10-CM | POA: Diagnosis not present

## 2022-11-01 DIAGNOSIS — M109 Gout, unspecified: Secondary | ICD-10-CM | POA: Diagnosis present

## 2022-11-01 DIAGNOSIS — Z8619 Personal history of other infectious and parasitic diseases: Secondary | ICD-10-CM

## 2022-11-01 DIAGNOSIS — F209 Schizophrenia, unspecified: Secondary | ICD-10-CM | POA: Diagnosis present

## 2022-11-01 DIAGNOSIS — B20 Human immunodeficiency virus [HIV] disease: Secondary | ICD-10-CM | POA: Diagnosis present

## 2022-11-01 DIAGNOSIS — E876 Hypokalemia: Secondary | ICD-10-CM | POA: Diagnosis not present

## 2022-11-01 LAB — ETHANOL: Alcohol, Ethyl (B): <10 mg/dL (ref <10)

## 2022-11-01 LAB — COMPREHENSIVE METABOLIC PANEL
ALT: 67 U/L — ABNORMAL HIGH (ref 0–44)
AST: 82 U/L — ABNORMAL HIGH (ref 15–41)
Albumin: 3.2 g/dL — ABNORMAL LOW (ref 3.5–5.0)
Alkaline Phosphatase: 82 U/L (ref 38–126)
Anion gap: 8 (ref 5–15)
BUN: 9 mg/dL (ref 6–20)
CO2: 26 mmol/L (ref 22–32)
Calcium: 8.2 mg/dL — ABNORMAL LOW (ref 8.9–10.3)
Chloride: 106 mmol/L (ref 98–111)
Creatinine, Ser: 1.43 mg/dL — ABNORMAL HIGH (ref 0.61–1.24)
GFR, Estimated: 58 mL/min — ABNORMAL LOW (ref >60)
Glucose, Bld: 126 mg/dL — ABNORMAL HIGH (ref 70–99)
Potassium: 3.8 mmol/L (ref 3.5–5.1)
Sodium: 140 mmol/L (ref 135–145)
Total Bilirubin: 0.4 mg/dL (ref 0.3–1.2)
Total Protein: 7.2 g/dL (ref 6.5–8.1)

## 2022-11-01 LAB — CBC WITH DIFFERENTIAL/PLATELET
Abs Immature Granulocytes: 0.02 10*3/uL (ref 0.00–0.07)
Basophils Absolute: 0 10*3/uL (ref 0.0–0.1)
Basophils Relative: 0 %
Eosinophils Absolute: 0 10*3/uL (ref 0.0–0.5)
Eosinophils Relative: 1 %
HCT: 38.6 % — ABNORMAL LOW (ref 39.0–52.0)
Hemoglobin: 12.7 g/dL — ABNORMAL LOW (ref 13.0–17.0)
Immature Granulocytes: 1 %
Lymphocytes Relative: 26 %
Lymphs Abs: 0.8 10*3/uL (ref 0.7–4.0)
MCH: 33.4 pg (ref 26.0–34.0)
MCHC: 32.9 g/dL (ref 30.0–36.0)
MCV: 101.6 fL — ABNORMAL HIGH (ref 80.0–100.0)
Monocytes Absolute: 0.1 10*3/uL (ref 0.1–1.0)
Monocytes Relative: 4 %
Neutro Abs: 2.2 10*3/uL (ref 1.7–7.7)
Neutrophils Relative %: 68 %
Platelets: 110 10*3/uL — ABNORMAL LOW (ref 150–400)
RBC: 3.8 MIL/uL — ABNORMAL LOW (ref 4.22–5.81)
RDW: 12.7 % (ref 11.5–15.5)
WBC: 3.1 10*3/uL — ABNORMAL LOW (ref 4.0–10.5)
nRBC: 0 % (ref 0.0–0.2)

## 2022-11-01 LAB — I-STAT VENOUS BLOOD GAS, ED
Acid-base deficit: 1 mmol/L (ref 0.0–2.0)
Bicarbonate: 27.5 mmol/L (ref 20.0–28.0)
Calcium, Ion: 1.13 mmol/L — ABNORMAL LOW (ref 1.15–1.40)
HCT: 39 % (ref 39.0–52.0)
Hemoglobin: 13.3 g/dL (ref 13.0–17.0)
O2 Saturation: 69 %
Potassium: 3.9 mmol/L (ref 3.5–5.1)
Sodium: 142 mmol/L (ref 135–145)
TCO2: 30 mmol/L (ref 22–32)
pCO2, Ven: 65.7 mmHg — ABNORMAL HIGH (ref 44–60)
pH, Ven: 7.23 — ABNORMAL LOW (ref 7.25–7.43)
pO2, Ven: 44 mmHg (ref 32–45)

## 2022-11-01 MED ORDER — LORAZEPAM 2 MG/ML IJ SOLN
1.0000 mg | Freq: Once | INTRAMUSCULAR | Status: AC
  Administered 2022-11-01: 1 mg via INTRAVENOUS
  Filled 2022-11-01: qty 1

## 2022-11-01 MED ORDER — NALOXONE HCL 0.4 MG/ML IJ SOLN
0.4000 mg | Freq: Once | INTRAMUSCULAR | Status: AC
  Administered 2022-11-01: 0.4 mg via INTRAVENOUS
  Filled 2022-11-01: qty 1

## 2022-11-01 MED ORDER — SODIUM CHLORIDE 0.9 % IV BOLUS
1000.0000 mL | Freq: Once | INTRAVENOUS | Status: AC
  Administered 2022-11-01: 1000 mL via INTRAVENOUS

## 2022-11-01 NOTE — ED Triage Notes (Signed)
Patient arrived via [facility] for drug overdose. Per EMS, a friend called, reported that patient was not responsive from drug overdose. EMS reported to have found white substance with the patient. Patient was found altered with shallow breathing, given 2.5 mg total of Narcan and his mental status and breathing improved. Patient arrived to ED with altered mental status but response to speech.

## 2022-11-01 NOTE — ED Provider Notes (Signed)
[facility] EMERGENCY DEPARTMENT Provider Note   CSN: [id number] Arrival date & time: 11/01/22  2230     History  No chief complaint on file.   Curtis Hodge is a 56 y.o. male.  The history is provided by the EMS personnel and medical records. No language interpreter was used.     56 year old male with significant history of HIV, hepatitis B, polysubstance abuse, homelessness, hypertension, schizophrenia, brought here via EMS for evaluation of altered mental status.  History is limited due to patient condition.  Patient was found to be altered, unknown circumstances or where he was found.  Patient did receive 2.5 mg of Narcan prior to arrival with minimal response.  Patient was found to be somnolence, his clothes are wet, shallow breathing and minimally responsive.  Level 5 caveat applies due to altered mental status changes.   Home Medications Prior to Admission medications   Medication Sig Start Date End Date Taking? Authorizing Provider  albuterol (VENTOLIN HFA) 108 (90 Base) MCG/ACT inhaler Inhale 2 puffs into the lungs every 6 (six) hours as needed for wheezing or shortness of breath. 05/28/22   Mayers, Kasandra Knudsen, PA-C  aspirin EC 81 MG EC tablet Take 1 tablet (81 mg total) by mouth daily. Swallow whole. Patient not taking: Reported on 05/28/2022 01/17/22   Rodolph Bong, MD  atorvastatin (LIPITOR) 40 MG tablet Take 1 tablet (40 mg total) by mouth daily. 05/28/22   Mayers, Cari S, PA-C  bictegravir-emtricitabine-tenofovir AF (BIKTARVY) 50-200-25 MG TABS tablet Take 1 tablet by mouth daily. 02/07/22   Randall Hiss, MD  carvedilol (COREG) 3.125 MG tablet Take 1 tablet (3.125 mg total) by mouth 2 (two) times daily with a meal. 05/28/22   Mayers, Cari S, PA-C  ciclesonide (ALVESCO) 80 MCG/ACT inhaler Inhale 1 puff into the lungs 2 (two) times daily as needed. 05/28/22   Mayers, Cari S, PA-C  gabapentin (NEURONTIN) 300 MG capsule Take 1 capsule (300 mg total)  by mouth 3 (three) times daily. 05/28/22   Mayers, Cari S, PA-C  haloperidol (HALDOL) 5 MG tablet Take 1 tablet (5 mg total) by mouth at bedtime. 05/28/22   Mayers, Cari S, PA-C  hydrOXYzine (ATARAX) 25 MG tablet Take 1 tablet (25 mg total) by mouth 3 (three) times daily as needed for anxiety. 05/28/22   Mayers, Cari S, PA-C  nicotine (NICODERM CQ - DOSED IN MG/24 HOURS) 21 mg/24hr patch Place 1 patch (21 mg total) onto the skin daily. 01/17/22   Rodolph Bong, MD  sertraline (ZOLOFT) 50 MG tablet Take 1 tablet (50 mg total) by mouth daily. 05/28/22   Mayers, Cari S, PA-C  sulfamethoxazole-trimethoprim (BACTRIM DS) 800-160 MG tablet Take 1 tablet by mouth daily. 06/06/22   Jennette Kettle, RPH-CPP  tiotropium (SPIRIVA HANDIHALER) 18 MCG inhalation capsule Place 1 capsule (18 mcg total) into inhaler and inhale daily. 05/28/22 07/27/22  Mayers, Cari S, PA-C  traZODone (DESYREL) 100 MG tablet Take 1 tablet (100 mg total) by mouth at bedtime. 05/28/22   Mayers, Cari S, PA-C      Allergies    Patient has no known allergies.    Review of Systems   Review of Systems  Unable to perform ROS: Mental status change    Physical Exam Updated Vital Signs BP 125/80 (BP Location: Left Arm)   Pulse 91   Temp 98.2 F (36.8 C) (Oral)   Resp 17   SpO2 (!) 84%  Physical Exam Vitals and  nursing note reviewed.  Constitutional:      Appearance: He is well-developed.     Comments: Patient is somnolent, shallow breathing but occasionally became agitated, thrashing in bed lasting for few seconds and happen sporadically.  HENT:     Head: Normocephalic and atraumatic.     Mouth/Throat:     Mouth: Mucous membranes are dry.  Eyes:     Conjunctiva/sclera: Conjunctivae normal.     Comments: Pupils 2 mm but reactive bilaterally.  Cardiovascular:     Rate and Rhythm: Tachycardia present.     Heart sounds: No murmur heard.    No friction rub. No gallop.  Pulmonary:     Comments: Breath shallow Abdominal:      Palpations: Abdomen is soft.     Tenderness: There is no abdominal tenderness.  Musculoskeletal:     Cervical back: Normal range of motion and neck supple.     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  Skin:    Findings: No rash.  Neurological:     Mental Status: He is disoriented.     GCS: GCS eye subscore is 3. GCS verbal subscore is 4. GCS motor subscore is 5.     ED Results / Procedures / Treatments   Labs (all labs ordered are listed, but only abnormal results are displayed) Labs Reviewed  I-STAT VENOUS BLOOD GAS, ED - Abnormal; Notable for the following components:      Result Value   pH, Ven 7.230 (*)    pCO2, Ven 65.7 (*)    Calcium, Ion 1.13 (*)    All other components within normal limits  RESP PANEL BY RT-PCR (RSV, FLU A&B, COVID)  [id number]  CBC WITH DIFFERENTIAL/PLATELET  COMPREHENSIVE METABOLIC PANEL  ETHANOL  RAPID URINE DRUG SCREEN, HOSP PERFORMED  URINALYSIS, ROUTINE W REFLEX MICROSCOPIC  LACTIC ACID, PLASMA  LACTIC ACID, PLASMA  BRAIN NATRIURETIC PEPTIDE    EKG None  Radiology DG Chest Portable 1 View  Result Date: 11/01/2022 CLINICAL DATA:  Hypoxia EXAM: PORTABLE CHEST 1 VIEW COMPARISON:  Chest x-ray 01/14/2022.  Chest CT 01/14/2022 3 FINDINGS: There are hazy airspace opacities in the right mid lung and left mid and upper lung, new from prior. There is no pleural effusion or pneumothorax. Large bullae are again seen in the upper lungs bilaterally, right greater than left. Cardiomediastinal silhouette is within normal limits. No acute fractures are seen. IMPRESSION: Hazy airspace opacities in the right mid lung and left mid and upper lung, new from prior. Findings could represent infection or edema. Electronically Signed   By: Darliss Cheney M.D.   On: 11/01/2022 23:10    Procedures .Critical Care  Performed by: Fayrene Helper, PA-C Authorized by: Fayrene Helper, PA-C    Critical care provider statement:    Critical care time (minutes):  30   Critical care was time spent personally by me on the following activities:  Development of treatment plan with patient or surrogate, discussions with consultants, evaluation of patient's response to treatment, examination of patient, ordering and review of laboratory studies, ordering and review of radiographic studies, ordering and performing treatments and interventions, pulse oximetry, re-evaluation of patient's condition and review of old charts     Medications Ordered in ED Medications  naloxone (NARCAN) injection 0.4 mg (has no administration in time range)  sodium chloride 0.9 % bolus 1,000 mL (has no administration in time range)    ED  Course/ Medical Decision Making/ A&P                           Medical Decision Making Amount and/or Complexity of Data Reviewed Labs: ordered. Radiology: ordered. ECG/medicine tests: ordered.  Risk Prescription drug management.   BP 125/80 (BP Location: Left Arm)   Pulse 91   Temp 98.2 F (36.8 C) (Oral)   Resp 17   SpO2 (!) 84%   22:32 PM   56 year old male with significant history of HIV, hepatitis B, polysubstance abuse, homelessness, hypertension, schizophrenia, brought here via EMS for evaluation of altered mental status.  History is limited due to patient condition.  Patient was found to be altered, unknown circumstances or where he was found.  Patient did receive 2.5 mg of Narcan prior to arrival with minimal response.  Patient was found to be somnolence, his clothes are wet, shallow breathing and minimally responsive.  Level 5 caveat applies due to altered mental status changes.   On exam, patient is laying in bed, shallow breathing but occasionally becomes agitated and thrashing about moving all 4 extremities.  He is responding to painful stimuli, and occasionally mumble.  He does not have any signs of trauma.  Initially he was hypoxic in the 80s with  shallow breathing.  After an application of nonrebreather, O2 sats improved to 93%.  Patient appears more responsive after receiving additional 0.4 mg of Narcan via IV.  Currently suspect altered mental status likely due to drug-induced agitated delirium.  11:18 PM Initial venous blood gas obtained showing pH of 7.23 and pCO2 of 65 consistent with respiratory acidosis.  A purple chest x-ray obtained independent viewed interpreted by me and I agree with radiology interpretation.  X-ray demonstrate hazy airspace opacities in the right midlung and left mid and upper lung which is new from prior finding could represent infections or edema.  I discussed finding with on-call provider who will take over his care.  Patient is at risk for aspiration pneumonia he is also at risk for pulmonary edema in the setting of suspected drug use.  He did seem to respond after receiving Narcan.  At this time his O2 sat is at 98% on nonrebreather.        Final Clinical Impression(s) / ED Diagnoses Final diagnoses:  None    Rx / DC Orders ED Discharge Orders     None         Fayrene Helper, PA-C 11/01/22 2320    Margarita Grizzle, MD 11/04/22 [phone]

## 2022-11-01 NOTE — ED Provider Notes (Signed)
  Physical Exam  BP 125/80 (BP Location: Left Arm)   Pulse 91   Temp 98.2 F (36.8 C) (Oral)   Resp 17   SpO2 (!) 84%   Physical Exam HENT:     Head: Atraumatic.  Eyes:     Comments: 68mm bilaterally with equal responsiveness  Cardiovascular:     Rate and Rhythm: Tachycardia present.  Pulmonary:     Effort: Tachypnea and accessory muscle usage present.     Breath sounds: Rales present.  Neurological:     Comments: Responsive only to significant pain, becomes agitated for a few seconds then somnolent again    Procedures  Procedures  ED Course / MDM    Medical Decision Making Amount and/or Complexity of Data Reviewed Labs: ordered. Radiology: ordered. ECG/medicine tests: ordered.  Risk Prescription drug management. Decision regarding hospitalization.   ***

## 2022-11-02 ENCOUNTER — Emergency Department (HOSPITAL_COMMUNITY): Payer: Medicaid Other

## 2022-11-02 ENCOUNTER — Inpatient Hospital Stay (HOSPITAL_COMMUNITY): Payer: Medicaid Other

## 2022-11-02 DIAGNOSIS — T405X1A Poisoning by cocaine, accidental (unintentional), initial encounter: Secondary | ICD-10-CM | POA: Diagnosis present

## 2022-11-02 DIAGNOSIS — R4182 Altered mental status, unspecified: Secondary | ICD-10-CM | POA: Diagnosis present

## 2022-11-02 DIAGNOSIS — T40711A Poisoning by cannabis, accidental (unintentional), initial encounter: Secondary | ICD-10-CM | POA: Diagnosis present

## 2022-11-02 DIAGNOSIS — F1210 Cannabis abuse, uncomplicated: Secondary | ICD-10-CM | POA: Diagnosis present

## 2022-11-02 DIAGNOSIS — Z79899 Other long term (current) drug therapy: Secondary | ICD-10-CM | POA: Diagnosis not present

## 2022-11-02 DIAGNOSIS — F209 Schizophrenia, unspecified: Secondary | ICD-10-CM | POA: Diagnosis present

## 2022-11-02 DIAGNOSIS — N179 Acute kidney failure, unspecified: Secondary | ICD-10-CM | POA: Diagnosis present

## 2022-11-02 DIAGNOSIS — J9601 Acute respiratory failure with hypoxia: Secondary | ICD-10-CM

## 2022-11-02 DIAGNOSIS — Z89431 Acquired absence of right foot: Secondary | ICD-10-CM | POA: Diagnosis not present

## 2022-11-02 DIAGNOSIS — J690 Pneumonitis due to inhalation of food and vomit: Secondary | ICD-10-CM | POA: Diagnosis present

## 2022-11-02 DIAGNOSIS — Z5900 Homelessness unspecified: Secondary | ICD-10-CM | POA: Diagnosis not present

## 2022-11-02 DIAGNOSIS — Z1152 Encounter for screening for COVID-19: Secondary | ICD-10-CM | POA: Diagnosis not present

## 2022-11-02 DIAGNOSIS — J9602 Acute respiratory failure with hypercapnia: Secondary | ICD-10-CM | POA: Diagnosis present

## 2022-11-02 DIAGNOSIS — J439 Emphysema, unspecified: Secondary | ICD-10-CM | POA: Diagnosis present

## 2022-11-02 DIAGNOSIS — Z7982 Long term (current) use of aspirin: Secondary | ICD-10-CM | POA: Diagnosis not present

## 2022-11-02 DIAGNOSIS — F17210 Nicotine dependence, cigarettes, uncomplicated: Secondary | ICD-10-CM | POA: Diagnosis present

## 2022-11-02 DIAGNOSIS — F32A Depression, unspecified: Secondary | ICD-10-CM | POA: Diagnosis present

## 2022-11-02 DIAGNOSIS — E876 Hypokalemia: Secondary | ICD-10-CM | POA: Diagnosis not present

## 2022-11-02 DIAGNOSIS — B20 Human immunodeficiency virus [HIV] disease: Secondary | ICD-10-CM | POA: Diagnosis present

## 2022-11-02 DIAGNOSIS — F1410 Cocaine abuse, uncomplicated: Secondary | ICD-10-CM | POA: Diagnosis present

## 2022-11-02 DIAGNOSIS — T50904A Poisoning by unspecified drugs, medicaments and biological substances, undetermined, initial encounter: Secondary | ICD-10-CM | POA: Diagnosis not present

## 2022-11-02 DIAGNOSIS — M109 Gout, unspecified: Secondary | ICD-10-CM | POA: Diagnosis present

## 2022-11-02 DIAGNOSIS — N390 Urinary tract infection, site not specified: Secondary | ICD-10-CM | POA: Diagnosis present

## 2022-11-02 DIAGNOSIS — E8729 Other acidosis: Secondary | ICD-10-CM | POA: Diagnosis present

## 2022-11-02 DIAGNOSIS — G928 Other toxic encephalopathy: Secondary | ICD-10-CM | POA: Diagnosis present

## 2022-11-02 DIAGNOSIS — I10 Essential (primary) hypertension: Secondary | ICD-10-CM | POA: Diagnosis present

## 2022-11-02 LAB — I-STAT ARTERIAL BLOOD GAS, ED
Acid-base deficit: 1 mmol/L (ref 0.0–2.0)
Bicarbonate: 27.2 mmol/L (ref 20.0–28.0)
Calcium, Ion: 1.14 mmol/L — ABNORMAL LOW (ref 1.15–1.40)
HCT: 39 % (ref 39.0–52.0)
Hemoglobin: 13.3 g/dL (ref 13.0–17.0)
O2 Saturation: 98 %
Patient temperature: 98.6
Potassium: 4 mmol/L (ref 3.5–5.1)
Sodium: 142 mmol/L (ref 135–145)
TCO2: 29 mmol/L (ref 22–32)
pCO2 arterial: 58.8 mmHg — ABNORMAL HIGH (ref 32–48)
pH, Arterial: 7.274 — ABNORMAL LOW (ref 7.35–7.45)
pO2, Arterial: 126 mmHg — ABNORMAL HIGH (ref 83–108)

## 2022-11-02 LAB — TROPONIN I (HIGH SENSITIVITY)
Troponin I (High Sensitivity): 96 ng/L — ABNORMAL HIGH (ref <18)
Troponin I (High Sensitivity): 86 ng/L — ABNORMAL HIGH (ref <18)

## 2022-11-02 LAB — URINALYSIS, ROUTINE W REFLEX MICROSCOPIC
Bacteria, UA: NONE SEEN
Bilirubin Urine: NEGATIVE
Bilirubin Urine: NEGATIVE
Glucose, UA: NEGATIVE mg/dL
Glucose, UA: NEGATIVE mg/dL
Ketones, ur: NEGATIVE mg/dL
Ketones, ur: NEGATIVE mg/dL
Leukocytes,Ua: NEGATIVE
Leukocytes,Ua: NEGATIVE
Nitrite: NEGATIVE
Nitrite: NEGATIVE
Protein, ur: 30 mg/dL — AB
Protein, ur: NEGATIVE mg/dL
Specific Gravity, Urine: 1.01 (ref 1.005–1.030)
Specific Gravity, Urine: 1.013 (ref 1.005–1.030)
pH: 6 (ref 5.0–8.0)
pH: 6 (ref 5.0–8.0)

## 2022-11-02 LAB — CBC
HCT: 34.1 % — ABNORMAL LOW (ref 39.0–52.0)
Hemoglobin: 11.1 g/dL — ABNORMAL LOW (ref 13.0–17.0)
MCH: 32.9 pg (ref 26.0–34.0)
MCHC: 32.6 g/dL (ref 30.0–36.0)
MCV: 101.2 fL — ABNORMAL HIGH (ref 80.0–100.0)
Platelets: 98 10*3/uL — ABNORMAL LOW (ref 150–400)
RBC: 3.37 MIL/uL — ABNORMAL LOW (ref 4.22–5.81)
RDW: 12.7 % (ref 11.5–15.5)
WBC: 6.9 10*3/uL (ref 4.0–10.5)
nRBC: 0 % (ref 0.0–0.2)

## 2022-11-02 LAB — BASIC METABOLIC PANEL
Anion gap: 5 (ref 5–15)
BUN: 6 mg/dL (ref 6–20)
CO2: 24 mmol/L (ref 22–32)
Calcium: 6.1 mg/dL — CL (ref 8.9–10.3)
Chloride: 113 mmol/L — ABNORMAL HIGH (ref 98–111)
Creatinine, Ser: 0.82 mg/dL (ref 0.61–1.24)
GFR, Estimated: >60 mL/min (ref >60)
Glucose, Bld: 83 mg/dL (ref 70–99)
Potassium: 3.5 mmol/L (ref 3.5–5.1)
Sodium: 142 mmol/L (ref 135–145)

## 2022-11-02 LAB — RESPIRATORY PANEL BY PCR

## 2022-11-02 LAB — PROCALCITONIN: Procalcitonin: 0.89 ng/mL

## 2022-11-02 LAB — RESP PANEL BY RT-PCR (RSV, FLU A&B, COVID)  RVPGX2
Influenza A by PCR: NEGATIVE
Influenza B by PCR: NEGATIVE
Resp Syncytial Virus by PCR: NEGATIVE
SARS Coronavirus 2 by RT PCR: NEGATIVE

## 2022-11-02 LAB — MRSA NEXT GEN BY PCR, NASAL: MRSA by PCR Next Gen: NOT DETECTED

## 2022-11-02 LAB — RAPID URINE DRUG SCREEN, HOSP PERFORMED
Amphetamines: NOT DETECTED
Barbiturates: NOT DETECTED
Benzodiazepines: NOT DETECTED
Cocaine: POSITIVE — AB
Opiates: NOT DETECTED
Tetrahydrocannabinol: POSITIVE — AB

## 2022-11-02 LAB — STREP PNEUMONIAE URINARY ANTIGEN: Strep Pneumo Urinary Antigen: NEGATIVE

## 2022-11-02 LAB — BRAIN NATRIURETIC PEPTIDE: B Natriuretic Peptide: 128 pg/mL — ABNORMAL HIGH (ref 0.0–100.0)

## 2022-11-02 LAB — LACTIC ACID, PLASMA
Lactic Acid, Venous: 1.7 mmol/L (ref 0.5–1.9)
Lactic Acid, Venous: 1.3 mmol/L (ref 0.5–1.9)

## 2022-11-02 MED ORDER — PANTOPRAZOLE SODIUM 40 MG IV SOLR: 40.0000 mg | Freq: Every day | INTRAVENOUS | Status: DC

## 2022-11-02 MED ORDER — FENTANYL CITRATE PF 50 MCG/ML IJ SOSY
50.0000 ug | PREFILLED_SYRINGE | INTRAMUSCULAR | Status: DC | PRN
  Administered 2022-11-02: 100 ug via INTRAVENOUS
  Administered 2022-11-03: 200 ug via INTRAVENOUS
  Administered 2022-11-03: 100 ug via INTRAVENOUS
  Administered 2022-11-03 (×3): 200 ug via INTRAVENOUS
  Administered 2022-11-04: 100 ug via INTRAVENOUS
  Administered 2022-11-04 (×2): 200 ug via INTRAVENOUS
  Administered 2022-11-04: 100 ug via INTRAVENOUS
  Administered 2022-11-04: 50 ug via INTRAVENOUS
  Administered 2022-11-05: 200 ug via INTRAVENOUS
  Filled 2022-11-02 (×3): qty 4
  Filled 2022-11-02: qty 2
  Filled 2022-11-02 (×2): qty 4
  Filled 2022-11-02: qty 2
  Filled 2022-11-02 (×3): qty 4
  Filled 2022-11-02: qty 2
  Filled 2022-11-02: qty 4

## 2022-11-02 MED ORDER — PROPOFOL 1000 MG/100ML IV EMUL
0.0000 ug/kg/min | INTRAVENOUS | Status: DC
  Administered 2022-11-02: 30 ug/kg/min via INTRAVENOUS
  Administered 2022-11-02: 35 ug/kg/min via INTRAVENOUS
  Administered 2022-11-02: 50 ug/kg/min via INTRAVENOUS
  Administered 2022-11-02: 25 ug/kg/min via INTRAVENOUS
  Administered 2022-11-03 – 2022-11-04 (×6): 50 ug/kg/min via INTRAVENOUS
  Administered 2022-11-04: 35 ug/kg/min via INTRAVENOUS
  Administered 2022-11-04 – 2022-11-05 (×5): 50 ug/kg/min via INTRAVENOUS
  Filled 2022-11-02 (×15): qty 100

## 2022-11-02 MED ORDER — FENTANYL CITRATE PF 50 MCG/ML IJ SOSY
50.0000 ug | PREFILLED_SYRINGE | INTRAMUSCULAR | Status: DC | PRN
  Administered 2022-11-02 (×2): 50 ug via INTRAVENOUS
  Filled 2022-11-02 (×2): qty 1

## 2022-11-02 MED ORDER — VANCOMYCIN HCL 1500 MG/300ML IV SOLN
1500.0000 mg | Freq: Once | INTRAVENOUS | Status: AC
  Administered 2022-11-02: 1500 mg via INTRAVENOUS
  Filled 2022-11-02: qty 300

## 2022-11-02 MED ORDER — PROPOFOL 1000 MG/100ML IV EMUL
INTRAVENOUS | Status: AC
  Filled 2022-11-02: qty 100

## 2022-11-02 MED ORDER — LACTATED RINGERS IV BOLUS
1000.0000 mL | Freq: Once | INTRAVENOUS | Status: AC
  Administered 2022-11-02: 1000 mL via INTRAVENOUS

## 2022-11-02 MED ORDER — SODIUM CHLORIDE 0.9 % IV SOLN
2.0000 g | Freq: Once | INTRAVENOUS | Status: AC
  Administered 2022-11-02: 2 g via INTRAVENOUS
  Filled 2022-11-02: qty 12.5

## 2022-11-02 MED ORDER — ROCURONIUM BROMIDE 50 MG/5ML IV SOLN
INTRAVENOUS | Status: DC | PRN
  Administered 2022-11-02: 70 mg via INTRAVENOUS

## 2022-11-02 MED ORDER — SODIUM CHLORIDE 0.9 % IV SOLN
500.0000 mg | Freq: Every day | INTRAVENOUS | Status: DC
  Administered 2022-11-03 – 2022-11-05 (×3): 500 mg via INTRAVENOUS
  Filled 2022-11-02 (×5): qty 5

## 2022-11-02 MED ORDER — FAMOTIDINE 20 MG PO TABS
20.0000 mg | ORAL_TABLET | Freq: Two times a day (BID) | ORAL | Status: DC
  Administered 2022-11-02 – 2022-11-04 (×5): 20 mg
  Filled 2022-11-02 (×5): qty 1

## 2022-11-02 MED ORDER — POLYETHYLENE GLYCOL 3350 17 G PO PACK: 17.0000 g | PACK | Freq: Every day | ORAL | Status: DC | PRN

## 2022-11-02 MED ORDER — IOHEXOL 350 MG/ML SOLN
50.0000 mL | Freq: Once | INTRAVENOUS | Status: AC | PRN
  Administered 2022-11-02: 50 mL via INTRAVENOUS

## 2022-11-02 MED ORDER — REVEFENACIN 175 MCG/3ML IN SOLN
175.0000 ug | Freq: Every day | RESPIRATORY_TRACT | Status: DC
  Administered 2022-11-02 – 2022-11-08 (×7): 175 ug via RESPIRATORY_TRACT
  Filled 2022-11-02 (×8): qty 3

## 2022-11-02 MED ORDER — DOCUSATE SODIUM 50 MG/5ML PO LIQD: 100.0000 mg | Freq: Two times a day (BID) | ORAL | Status: DC | PRN

## 2022-11-02 MED ORDER — ORAL CARE MOUTH RINSE: 15.0000 mL | OROMUCOSAL | Status: DC | PRN

## 2022-11-02 MED ORDER — HEPARIN SODIUM (PORCINE) 5000 UNIT/ML IJ SOLN
5000.0000 [IU] | Freq: Three times a day (TID) | INTRAMUSCULAR | Status: DC
  Administered 2022-11-02 – 2022-11-08 (×19): 5000 [IU] via SUBCUTANEOUS
  Filled 2022-11-02 (×18): qty 1

## 2022-11-02 MED ORDER — ARFORMOTEROL TARTRATE 15 MCG/2ML IN NEBU
15.0000 ug | INHALATION_SOLUTION | Freq: Two times a day (BID) | RESPIRATORY_TRACT | Status: DC
  Administered 2022-11-02 – 2022-11-08 (×11): 15 ug via RESPIRATORY_TRACT
  Filled 2022-11-02 (×12): qty 2

## 2022-11-02 MED ORDER — BUDESONIDE 0.5 MG/2ML IN SUSP
0.5000 mg | Freq: Two times a day (BID) | RESPIRATORY_TRACT | Status: DC
  Administered 2022-11-02 – 2022-11-08 (×11): 0.5 mg via RESPIRATORY_TRACT
  Filled 2022-11-02 (×12): qty 2

## 2022-11-02 MED ORDER — ORAL CARE MOUTH RINSE
15.0000 mL | OROMUCOSAL | Status: DC
  Administered 2022-11-02 – 2022-11-05 (×36): 15 mL via OROMUCOSAL

## 2022-11-02 MED ORDER — CHLORHEXIDINE GLUCONATE CLOTH 2 % EX PADS
6.0000 | MEDICATED_PAD | Freq: Every day | CUTANEOUS | Status: DC
  Administered 2022-11-03 – 2022-11-05 (×5): 6 via TOPICAL

## 2022-11-02 MED ORDER — CALCIUM GLUCONATE-NACL 2-0.675 GM/100ML-% IV SOLN
2.0000 g | Freq: Once | INTRAVENOUS | Status: AC
  Administered 2022-11-02: 2000 mg via INTRAVENOUS
  Filled 2022-11-02: qty 100

## 2022-11-02 MED ORDER — ETOMIDATE 2 MG/ML IV SOLN
INTRAVENOUS | Status: DC | PRN
  Administered 2022-11-02: 20 mg via INTRAVENOUS

## 2022-11-02 MED ORDER — PROPOFOL 1000 MG/100ML IV EMUL
0.0000 ug/kg/min | INTRAVENOUS | Status: DC
  Administered 2022-11-02: 5 ug/kg/min via INTRAVENOUS
  Administered 2022-11-02: 25 ug/kg/min via INTRAVENOUS

## 2022-11-02 MED ORDER — IPRATROPIUM-ALBUTEROL 0.5-2.5 (3) MG/3ML IN SOLN: 3.0000 mL | Freq: Four times a day (QID) | RESPIRATORY_TRACT | Status: DC | PRN

## 2022-11-02 NOTE — Progress Notes (Signed)
Pt transported to CT and back without incident.  

## 2022-11-02 NOTE — Plan of Care (Signed)
  Problem: Clinical Measurements: Goal: Ability to maintain clinical measurements within normal limits will improve Outcome: Progressing Goal: Will remain free from infection Outcome: Progressing Goal: Diagnostic test results will improve Outcome: Progressing Goal: Respiratory complications will improve Outcome: Progressing Goal: Cardiovascular complication will be avoided Outcome: Progressing   Problem: Elimination: Goal: Will not experience complications related to urinary retention Outcome: Progressing   Problem: Safety: Goal: Ability to remain free from injury will improve Outcome: Progressing   Problem: Respiratory: Goal: Ability to maintain a clear airway and adequate ventilation will improve Outcome: Progressing   Problem: Education: Goal: Knowledge of General Education information will improve Description: Including pain rating scale, medication(s)/side effects and non-pharmacologic comfort measures Outcome: Not Progressing   Problem: Health Behavior/Discharge Planning: Goal: Ability to manage health-related needs will improve Outcome: Not Progressing   Problem: Activity: Goal: Risk for activity intolerance will decrease Outcome: Not Progressing   Problem: Nutrition: Goal: Adequate nutrition will be maintained Outcome: Not Progressing   Problem: Coping: Goal: Level of anxiety will decrease Outcome: Not Progressing   Problem: Elimination: Goal: Will not experience complications related to bowel motility Outcome: Not Progressing   Problem: Pain Managment: Goal: General experience of comfort will improve Outcome: Not Progressing   Problem: Skin Integrity: Goal: Risk for impaired skin integrity will decrease Outcome: Not Progressing   Problem: Activity: Goal: Ability to tolerate increased activity will improve Outcome: Not Progressing   Problem: Role Relationship: Goal: Method of communication will improve Outcome: Not Progressing

## 2022-11-02 NOTE — Progress Notes (Signed)
Patient's belongings include; a pair of black steel toed boots, a pair of pants with a key attached to it, shirts, jackets, and sweatshirts.  All of which are wet.Marland KitchenMarland Kitchen

## 2022-11-02 NOTE — Progress Notes (Signed)
Pt transported to 4N25, no complications noted
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
## 2022-11-02 NOTE — Sedation Documentation (Signed)
Intubation tube in, 25/lip
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
## 2022-11-02 NOTE — ED Notes (Signed)
[phone] brother wouldl like a update

## 2022-11-02 NOTE — Plan of Care (Signed)
Patient received on [facility] ventilator support as charted. FiO2 titrated to 50%, no other changes made at this time. No weaning done today per MD.

## 2022-11-02 NOTE — Progress Notes (Signed)
eLink Physician-Brief Progress Note Patient Name: Curtis Hodge DOB: [birth year] MRN: [id number]   Date of Service  11/02/2022  HPI/Events of Note   56 y/o M admitted from ED w/AMS, AKI, acute resp failure.  Narcan given w/good response.  Intubated in ED.  CXR c/w aspiration.  Has H/O polysub abuse, HIV, schizophrenia, Hep B, emphysema .   eICU Interventions  - Seen via E-Link camera  - comfortable on Propofol infusion  - synchronous with vent  -does not appear to require further sedation at this time         Curtis Hodge N Curtis Hodge 11/02/2022, 7:21 AM

## 2022-11-03 ENCOUNTER — Inpatient Hospital Stay (HOSPITAL_COMMUNITY): Payer: Medicaid Other

## 2022-11-03 DIAGNOSIS — T50904A Poisoning by unspecified drugs, medicaments and biological substances, undetermined, initial encounter: Secondary | ICD-10-CM | POA: Diagnosis not present

## 2022-11-03 LAB — TRIGLYCERIDES: Triglycerides: 194 mg/dL — ABNORMAL HIGH (ref <150)

## 2022-11-03 MED ORDER — ACETAMINOPHEN 160 MG/5ML PO SOLN
650.0000 mg | Freq: Four times a day (QID) | ORAL | Status: DC | PRN
  Administered 2022-11-03 – 2022-11-04 (×3): 650 mg
  Filled 2022-11-03 (×3): qty 20.3

## 2022-11-03 MED ORDER — SODIUM CHLORIDE 0.9 % IV SOLN
1.0000 g | INTRAVENOUS | Status: DC
  Administered 2022-11-03 – 2022-11-04 (×2): 1 g via INTRAVENOUS
  Filled 2022-11-03 (×2): qty 10

## 2022-11-03 NOTE — Progress Notes (Signed)
eLink Physician-Brief Progress Note Patient Name: Curtis Hodge DOB: [birth year] MRN: [id number]   Date of Service  11/03/2022  HPI/Events of Note  BSRN noted slight hematuria in foley and slight blood tinged OGT o/p. Pt has SQ Heparin for this AM.  Patient seen intubated. Creatinine 0.82. Platelets 98 stable  eICU Interventions  Discussed with bedside RN that risk is higher than benefit of withholding prophylactic heparin. Will continue heparin as scheduled     Intervention Category Intermediate Interventions: Bleeding - evaluation and treatment with blood products  Darl Pikes 11/03/2022, 4:53 AM

## 2022-11-03 NOTE — Plan of Care (Signed)
  Problem: Clinical Measurements: Goal: Cardiovascular complication will be avoided Outcome: Progressing   Problem: Elimination: Goal: Will not experience complications related to urinary retention Outcome: Progressing   Problem: Safety: Goal: Ability to remain free from injury will improve Outcome: Progressing   Problem: Respiratory: Goal: Ability to maintain a clear airway and adequate ventilation will improve Outcome: Progressing   Problem: Safety: Goal: Non-violent Restraint(s) Outcome: Not Progressing   Problem: Education: Goal: Knowledge of General Education information will improve Description: Including pain rating scale, medication(s)/side effects and non-pharmacologic comfort measures Outcome: Not Progressing   Problem: Health Behavior/Discharge Planning: Goal: Ability to manage health-related needs will improve Outcome: Not Progressing   Problem: Clinical Measurements: Goal: Ability to maintain clinical measurements within normal limits will improve Outcome: Not Progressing Goal: Will remain free from infection Outcome: Not Progressing Goal: Diagnostic test results will improve Outcome: Not Progressing Goal: Respiratory complications will improve Outcome: Not Progressing   Problem: Activity: Goal: Risk for activity intolerance will decrease Outcome: Not Progressing   Problem: Nutrition: Goal: Adequate nutrition will be maintained Outcome: Not Progressing   Problem: Coping: Goal: Level of anxiety will decrease Outcome: Not Progressing   Problem: Elimination: Goal: Will not experience complications related to bowel motility Outcome: Not Progressing   Problem: Pain Managment: Goal: General experience of comfort will improve Outcome: Not Progressing   Problem: Skin Integrity: Goal: Risk for impaired skin integrity will decrease Outcome: Not Progressing   Problem: Activity: Goal: Ability to tolerate increased activity will improve Outcome: Not  Progressing   Problem: Role Relationship: Goal: Method of communication will improve Outcome: Not Progressing

## 2022-11-03 NOTE — Progress Notes (Signed)
Called E-link concerning pt new onset bloody urine and OG output. Pt has scheduled SQ heparin this am. Will await instructions for holding heparin.
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
## 2022-11-04 ENCOUNTER — Inpatient Hospital Stay (HOSPITAL_COMMUNITY): Payer: Medicaid Other

## 2022-11-04 DIAGNOSIS — J9601 Acute respiratory failure with hypoxia: Secondary | ICD-10-CM | POA: Diagnosis not present

## 2022-11-04 LAB — CBC
HCT: 40.6 % (ref 39.0–52.0)
Hemoglobin: 13 g/dL (ref 13.0–17.0)
MCH: 32.6 pg (ref 26.0–34.0)
MCHC: 32 g/dL (ref 30.0–36.0)
MCV: 101.8 fL — ABNORMAL HIGH (ref 80.0–100.0)
Platelets: 109 10*3/uL — ABNORMAL LOW (ref 150–400)
RBC: 3.99 MIL/uL — ABNORMAL LOW (ref 4.22–5.81)
RDW: 12.6 % (ref 11.5–15.5)
WBC: 4.4 10*3/uL (ref 4.0–10.5)
nRBC: 0 % (ref 0.0–0.2)

## 2022-11-04 LAB — MAGNESIUM
Magnesium: 2.1 mg/dL (ref 1.7–2.4)
Magnesium: 1.9 mg/dL (ref 1.7–2.4)

## 2022-11-04 LAB — BASIC METABOLIC PANEL
Anion gap: 8 (ref 5–15)
BUN: 8 mg/dL (ref 6–20)
CO2: 26 mmol/L (ref 22–32)
Calcium: 8.4 mg/dL — ABNORMAL LOW (ref 8.9–10.3)
Chloride: 103 mmol/L (ref 98–111)
Creatinine, Ser: 1.13 mg/dL (ref 0.61–1.24)
GFR, Estimated: >60 mL/min (ref >60)
Glucose, Bld: 88 mg/dL (ref 70–99)
Potassium: 3.8 mmol/L (ref 3.5–5.1)
Sodium: 137 mmol/L (ref 135–145)

## 2022-11-04 LAB — GLUCOSE, CAPILLARY
Glucose-Capillary: 109 mg/dL — ABNORMAL HIGH (ref 70–99)
Glucose-Capillary: 142 mg/dL — ABNORMAL HIGH (ref 70–99)
Glucose-Capillary: 90 mg/dL (ref 70–99)
Glucose-Capillary: 98 mg/dL (ref 70–99)
Glucose-Capillary: 99 mg/dL (ref 70–99)

## 2022-11-04 LAB — LEGIONELLA PNEUMOPHILA SEROGP 1 UR AG: L. pneumophila Serogp 1 Ur Ag: NEGATIVE

## 2022-11-04 LAB — PHOSPHORUS
Phosphorus: 3.2 mg/dL (ref 2.5–4.6)
Phosphorus: 3.4 mg/dL (ref 2.5–4.6)

## 2022-11-04 MED ORDER — PANTOPRAZOLE SODIUM 40 MG IV SOLR
40.0000 mg | Freq: Every day | INTRAVENOUS | Status: DC
  Administered 2022-11-04: 40 mg via INTRAVENOUS
  Filled 2022-11-04: qty 10

## 2022-11-04 MED ORDER — PROSOURCE TF20 ENFIT COMPATIBL EN LIQD: 60.0000 mL | Freq: Every day | ENTERAL | Status: DC

## 2022-11-04 MED ORDER — SODIUM CHLORIDE 0.9 % IV SOLN
1.0000 g | Freq: Once | INTRAVENOUS | Status: AC
  Administered 2022-11-04: 1 g via INTRAVENOUS
  Filled 2022-11-04: qty 10

## 2022-11-04 MED ORDER — SODIUM CHLORIDE 0.9 % IV SOLN
2.0000 g | INTRAVENOUS | Status: AC
  Administered 2022-11-05 – 2022-11-07 (×3): 2 g via INTRAVENOUS
  Filled 2022-11-04 (×3): qty 20

## 2022-11-04 MED ORDER — VITAL AF 1.2 CAL PO LIQD
1000.0000 mL | ORAL | Status: DC
  Administered 2022-11-04: 1000 mL
  Filled 2022-11-04: qty 1000

## 2022-11-04 MED ORDER — VITAL HIGH PROTEIN PO LIQD: 1000.0000 mL | ORAL | Status: DC

## 2022-11-04 NOTE — Plan of Care (Signed)
  Problem: Clinical Measurements: Goal: Ability to maintain clinical measurements within normal limits will improve Outcome: Progressing Goal: Cardiovascular complication will be avoided Outcome: Progressing   Problem: Activity: Goal: Risk for activity intolerance will decrease Outcome: Progressing   Problem: Nutrition: Goal: Adequate nutrition will be maintained Outcome: Progressing   Problem: Pain Managment: Goal: General experience of comfort will improve Outcome: Progressing   Problem: Safety: Goal: Ability to remain free from injury will improve Outcome: Progressing   Problem: Skin Integrity: Goal: Risk for impaired skin integrity will decrease Outcome: Progressing   Problem: Activity: Goal: Ability to tolerate increased activity will improve Outcome: Progressing   Problem: Respiratory: Goal: Ability to maintain a clear airway and adequate ventilation will improve Outcome: Progressing   Problem: Safety: Goal: Non-violent Restraint(s) Outcome: Not Progressing   Problem: Education: Goal: Knowledge of General Education information will improve Description: Including pain rating scale, medication(s)/side effects and non-pharmacologic comfort measures Outcome: Not Progressing   Problem: Health Behavior/Discharge Planning: Goal: Ability to manage health-related needs will improve Outcome: Not Progressing   Problem: Clinical Measurements: Goal: Will remain free from infection Outcome: Not Progressing Goal: Diagnostic test results will improve Outcome: Not Progressing Goal: Respiratory complications will improve Outcome: Not Progressing   Problem: Coping: Goal: Level of anxiety will decrease Outcome: Not Progressing   Problem: Elimination: Goal: Will not experience complications related to bowel motility Outcome: Not Progressing Goal: Will not experience complications related to urinary retention Outcome: Not Progressing   Problem: Role  Relationship: Goal: Method of communication will improve Outcome: Not Progressing
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
## 2022-11-04 NOTE — Progress Notes (Signed)
Initial Nutrition Assessment  DOCUMENTATION CODES:   Not applicable  INTERVENTION:  - Initiate TF via OG tube @ 15 mL/hr and advance 10 mL Q8H to goal rate of 55 mL/hr.  - This will provide the pt with 1584 kcals, 99 gm protein and 1070 mL fluid.   NUTRITION DIAGNOSIS:   Inadequate oral intake related to inability to eat as evidenced by NPO status.  GOAL:   Provide needs based on ASPEN/SCCM guidelines  MONITOR:   TF tolerance, Labs  REASON FOR ASSESSMENT:   Ventilator, Consult Enteral/tube feeding initiation and management  ASSESSMENT:   56 y.o. male admits related to AMS. PMH includes: Emphysema, Hep B, HIV, schizophrenia. Pt is currently receiving medical management for AMS. Toxic metabolic encephalopathy and acute hypoxemic and hypercapnic respiratory failure.  Meds reviewed: Pepcid. Propofol @ 20.9 mL/hr (550 kcals/day). Labs reviewed: WNL.   The pt is currently NPO and on vent with OG tube in place. MD consult to initiate tube feeds. No family present at time of assessment. Wts fluctuate per record, but overall no significant wt loss. RD will initiate TF with slow advancement to goal rate. Will continue to monitor tolerance and POC. Discussed TF order with RN.  NUTRITION - FOCUSED PHYSICAL EXAM:  Flowsheet Row Most Recent Value  Orbital Region No depletion  Upper Arm Region Unable to assess  Thoracic and Lumbar Region Unable to assess  Buccal Region No depletion  Temple Region Mild depletion  Clavicle Bone Region Mild depletion  Clavicle and Acromion Bone Region Mild depletion  Scapular Bone Region Unable to assess  Dorsal Hand No depletion  Patellar Region Unable to assess  Anterior Thigh Region Unable to assess  Posterior Calf Region Unable to assess  Edema (RD Assessment) None  Hair Reviewed  Eyes Unable to assess  Mouth Unable to assess  Skin Reviewed  Nails Reviewed       Diet Order:   Diet Order             Diet NPO time specified  Diet  effective now                   EDUCATION NEEDS:   Not appropriate for education at this time  Skin:  Skin Assessment: Reviewed RN Assessment  Last BM:  unknown  Height:   Ht Readings from Last 1 Encounters:  11/02/22 5\' 9"  (1.753 m)    Weight:   Wt Readings from Last 1 Encounters:  11/04/22 65.5 kg    Ideal Body Weight:     BMI:  Body mass index is 21.32 kg/m.  Estimated Nutritional Needs:   Kcal:  1440-1635 kcals  Protein:  75-130 gm  Fluid:  >/= 1.4 L  11/06/22, RD, LDN, CNSC.

## 2022-11-05 DIAGNOSIS — J690 Pneumonitis due to inhalation of food and vomit: Secondary | ICD-10-CM

## 2022-11-05 DIAGNOSIS — J9601 Acute respiratory failure with hypoxia: Secondary | ICD-10-CM | POA: Diagnosis not present

## 2022-11-05 DIAGNOSIS — G9341 Metabolic encephalopathy: Secondary | ICD-10-CM

## 2022-11-05 DIAGNOSIS — G928 Other toxic encephalopathy: Secondary | ICD-10-CM

## 2022-11-05 LAB — CBC
HCT: 36.4 % — ABNORMAL LOW (ref 39.0–52.0)
Hemoglobin: 12.1 g/dL — ABNORMAL LOW (ref 13.0–17.0)
MCH: 33.1 pg (ref 26.0–34.0)
MCHC: 33.2 g/dL (ref 30.0–36.0)
MCV: 99.5 fL (ref 80.0–100.0)
Platelets: 118 10*3/uL — ABNORMAL LOW (ref 150–400)
RBC: 3.66 MIL/uL — ABNORMAL LOW (ref 4.22–5.81)
RDW: 12.6 % (ref 11.5–15.5)
WBC: 3 10*3/uL — ABNORMAL LOW (ref 4.0–10.5)
nRBC: 0 % (ref 0.0–0.2)

## 2022-11-05 LAB — GLUCOSE, CAPILLARY
Glucose-Capillary: 116 mg/dL — ABNORMAL HIGH (ref 70–99)
Glucose-Capillary: 108 mg/dL — ABNORMAL HIGH (ref 70–99)
Glucose-Capillary: 88 mg/dL (ref 70–99)

## 2022-11-05 LAB — BASIC METABOLIC PANEL
Anion gap: 7 (ref 5–15)
BUN: 9 mg/dL (ref 6–20)
CO2: 28 mmol/L (ref 22–32)
Calcium: 8.4 mg/dL — ABNORMAL LOW (ref 8.9–10.3)
Chloride: 103 mmol/L (ref 98–111)
Creatinine, Ser: 0.85 mg/dL (ref 0.61–1.24)
GFR, Estimated: >60 mL/min (ref >60)
Glucose, Bld: 114 mg/dL — ABNORMAL HIGH (ref 70–99)
Potassium: 3.5 mmol/L (ref 3.5–5.1)
Sodium: 138 mmol/L (ref 135–145)

## 2022-11-05 LAB — PHOSPHORUS
Phosphorus: 3.2 mg/dL (ref 2.5–4.6)
Phosphorus: 2.1 mg/dL — ABNORMAL LOW (ref 2.5–4.6)

## 2022-11-05 LAB — URINE CULTURE: Culture: 3000 — AB

## 2022-11-05 LAB — CULTURE, RESPIRATORY W GRAM STAIN: Culture: NORMAL

## 2022-11-05 LAB — MAGNESIUM
Magnesium: 2 mg/dL (ref 1.7–2.4)
Magnesium: 1.9 mg/dL (ref 1.7–2.4)

## 2022-11-05 MED ORDER — ADULT MULTIVITAMIN W/MINERALS CH
1.0000 | ORAL_TABLET | Freq: Every day | ORAL | Status: DC
  Administered 2022-11-05 – 2022-11-08 (×4): 1 via ORAL
  Filled 2022-11-05 (×4): qty 1

## 2022-11-05 MED ORDER — DOCUSATE SODIUM 100 MG PO CAPS: 100.0000 mg | ORAL_CAPSULE | Freq: Two times a day (BID) | ORAL | Status: DC | PRN

## 2022-11-05 MED ORDER — THIAMINE HCL 100 MG/ML IJ SOLN
100.0000 mg | Freq: Every day | INTRAMUSCULAR | Status: DC
  Filled 2022-11-05 (×2): qty 2

## 2022-11-05 MED ORDER — BICTEGRAVIR-EMTRICITAB-TENOFOV 50-200-25 MG PO TABS
1.0000 | ORAL_TABLET | Freq: Every day | ORAL | Status: DC
  Administered 2022-11-05 – 2022-11-06 (×2): 1 via ORAL
  Filled 2022-11-05 (×2): qty 1

## 2022-11-05 MED ORDER — DEXMEDETOMIDINE HCL IN NACL 400 MCG/100ML IV SOLN: 0.4000 ug/kg/h | INTRAVENOUS | Status: DC

## 2022-11-05 MED ORDER — FOLIC ACID 1 MG PO TABS
1.0000 mg | ORAL_TABLET | Freq: Every day | ORAL | Status: DC
  Administered 2022-11-05 – 2022-11-08 (×4): 1 mg via ORAL
  Filled 2022-11-05 (×4): qty 1

## 2022-11-05 MED ORDER — ACETAMINOPHEN 325 MG PO TABS: 650.0000 mg | ORAL_TABLET | Freq: Four times a day (QID) | ORAL | Status: DC | PRN

## 2022-11-05 MED ORDER — THIAMINE MONONITRATE 100 MG PO TABS
100.0000 mg | ORAL_TABLET | Freq: Every day | ORAL | Status: DC
  Administered 2022-11-05 – 2022-11-08 (×4): 100 mg via ORAL
  Filled 2022-11-05 (×4): qty 1

## 2022-11-05 MED ORDER — POLYETHYLENE GLYCOL 3350 17 G PO PACK: 17.0000 g | PACK | Freq: Every day | ORAL | Status: DC | PRN

## 2022-11-05 MED ORDER — POLYETHYLENE GLYCOL 3350 17 G PO PACK
17.0000 g | PACK | Freq: Every day | ORAL | Status: DC
  Administered 2022-11-07: 17 g via ORAL
  Filled 2022-11-05 (×2): qty 1

## 2022-11-05 MED ORDER — BISACODYL 10 MG RE SUPP
10.0000 mg | Freq: Once | RECTAL | Status: AC
  Administered 2022-11-05: 10 mg via RECTAL
  Filled 2022-11-05: qty 1
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
## 2022-11-05 NOTE — Procedures (Signed)
Extubation Procedure Note  Patient Details:   Name: Curtis Hodge DOB: 1966/09/27 MRN: [id number]   Airway Documentation:    Vent end date: 11/05/22 Vent end time: 0805   Evaluation  O2 sats: stable throughout Complications: No apparent complications Patient did tolerate procedure well. Bilateral Breath Sounds: Rhonchi, Diminished   No Cuff leak noted. RT extubated pt per MD order. Pt placed on 8L [place] HFNC. No respiratory distress noted.  Romona Curls Curtis Hodge 11/05/2022, 8:15 AM

## 2022-11-05 NOTE — Progress Notes (Addendum)
SLP Cancellation Note  Patient Details Name: Curtis Hodge MRN: [id number] DOB: 10/11/1966   Cancelled treatment:       Reason Eval/Treat Not Completed: Patient not medically ready. Pt just extubated at 8. RN asked SLP to wait until later since he was very dysphonic. Suggested [facility] later and contact SLP depending on result.   Sydne Krahl, Riley Nearing 11/05/2022, 10:50 AM
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
## 2022-11-06 LAB — BASIC METABOLIC PANEL
Anion gap: 6 (ref 5–15)
BUN: 11 mg/dL (ref 6–20)
CO2: 27 mmol/L (ref 22–32)
Calcium: 8.3 mg/dL — ABNORMAL LOW (ref 8.9–10.3)
Chloride: 104 mmol/L (ref 98–111)
Creatinine, Ser: 0.86 mg/dL (ref 0.61–1.24)
GFR, Estimated: >60 mL/min (ref >60)
Glucose, Bld: 106 mg/dL — ABNORMAL HIGH (ref 70–99)
Potassium: 3.4 mmol/L — ABNORMAL LOW (ref 3.5–5.1)
Sodium: 137 mmol/L (ref 135–145)

## 2022-11-06 LAB — PHOSPHORUS: Phosphorus: 2.3 mg/dL — ABNORMAL LOW (ref 2.5–4.6)

## 2022-11-06 LAB — CBC
HCT: 34.1 % — ABNORMAL LOW (ref 39.0–52.0)
Hemoglobin: 11.8 g/dL — ABNORMAL LOW (ref 13.0–17.0)
MCH: 33.1 pg (ref 26.0–34.0)
MCHC: 34.6 g/dL (ref 30.0–36.0)
MCV: 95.8 fL (ref 80.0–100.0)
Platelets: 115 10*3/uL — ABNORMAL LOW (ref 150–400)
RBC: 3.56 MIL/uL — ABNORMAL LOW (ref 4.22–5.81)
RDW: 12 % (ref 11.5–15.5)
WBC: 3.7 10*3/uL — ABNORMAL LOW (ref 4.0–10.5)
nRBC: 0 % (ref 0.0–0.2)

## 2022-11-06 LAB — MAGNESIUM: Magnesium: 1.9 mg/dL (ref 1.7–2.4)

## 2022-11-06 LAB — GLUCOSE, CAPILLARY: Glucose-Capillary: 105 mg/dL — ABNORMAL HIGH (ref 70–99)

## 2022-11-06 MED ORDER — ENSURE ENLIVE PO LIQD
237.0000 mL | Freq: Two times a day (BID) | ORAL | Status: DC
  Administered 2022-11-06 – 2022-11-08 (×4): 237 mL via ORAL

## 2022-11-06 MED ORDER — POTASSIUM CHLORIDE CRYS ER 20 MEQ PO TBCR
20.0000 meq | EXTENDED_RELEASE_TABLET | Freq: Once | ORAL | Status: AC
  Administered 2022-11-06: 20 meq via ORAL
  Filled 2022-11-06: qty 1

## 2022-11-06 MED ORDER — MAGNESIUM SULFATE IN D5W 1-5 GM/100ML-% IV SOLN
1.0000 g | Freq: Once | INTRAVENOUS | Status: AC
  Administered 2022-11-06: 1 g via INTRAVENOUS
  Filled 2022-11-06: qty 100

## 2022-11-06 MED ORDER — BICTEGRAVIR-EMTRICITAB-TENOFOV 50-200-25 MG PO TABS
1.0000 | ORAL_TABLET | Freq: Every day | ORAL | Status: DC
  Administered 2022-11-07 – 2022-11-08 (×2): 1 via ORAL
  Filled 2022-11-06 (×2): qty 1

## 2022-11-06 MED ORDER — POTASSIUM PHOSPHATES 15 MMOLE/5ML IV SOLN
15.0000 mmol | Freq: Once | INTRAVENOUS | Status: AC
  Administered 2022-11-06: 15 mmol via INTRAVENOUS
  Filled 2022-11-06: qty 5
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
## 2022-11-06 NOTE — Progress Notes (Signed)
Consultation Progress Note   Patient: Curtis Hodge QTM:[id number] DOB: 05-12-1966 DOA: 11/01/2022 DOS: the patient was seen and examined on 11/06/2022 Primary service: Jahziel Sinn, Elgie Collard, MD  Brief hospital course: Curtis Hodge is a 56 year old male with HIV, shizophrenia, emphyhsema and homelessness who was brought in to the ER via EMS due to loss of consciousness reported by a friend. Per report he was given narcan with improvement in his breathing and mental status.    In the ER, patient was noted to have respiratory acidosis based on ABG and chest x-ray concerning for right mid lung opacities representing aspiration. He was placed on non-rebreather due to oxygen needs. He continued to have increasing O2 needs and altered mentation so he was intubated.  Now extubated, Remains in ICU pending bed availability.  Assessment and Plan: Toxic Metabolic Enephalopathy - Polysubstance abuse, would need psych for counseling.  Acute Hypoxemic and Hypercapnic Respiratory Failure  -Extubated, now on room air.  Aspiration Pneumonia  -Continues on Antibiotics CXR  shows LLL infiltrate Continue triple therapy nebs  Suptum cx gnr and gpc chains   Hypokalemia -Replaced by ICU  COPD/Bullous Emphysema  Continue Duonebs Follow up with Pulm OP  Acute Kidney Injury -Resolved   Pancytopenia -Likely in the setting of acute illness and HIV - monitor    HIV -Continue home biktarvy      TRH will continue to follow the patient.  Subjective: Seen in ICU, No complaints this morning  Physical Exam: Vitals:   11/06/22 0900 11/06/22 0939 11/06/22 1000 11/06/22 1100  BP:    (!) 147/97  Pulse: 72 72 74 74  Resp: 16 19 (!) 22 19  Temp:      TempSrc:      SpO2: 99% 98% 99% 99%  Weight:      Height:      General: male, Alert in no acute distress HENT: NCAT,  Lungs: Bilateral breath sounds diminished Cardiovascular: Regular rate rhythm, S1-S2 Abdomen: Soft, nontender  nondistended Extremities: No significant edema, amputations of the right foot toes Neuro: AO x 3, moving Ext equally GU: Deferred  Data Reviewed:  There are no new results to review at this time.  Family Communication: None at bedside  Time spent: 15 minutes.  Author: Baldomero Lamy, MD 11/06/2022 11:55 AM  For on call review [email or web address].

## 2022-11-06 NOTE — Progress Notes (Signed)
Nutrition Follow-up  DOCUMENTATION CODES:   Not applicable  INTERVENTION:   Ensure Enlive po BID, each supplement provides 350 kcal and 20 grams of protein.  Encourage PO intake    NUTRITION DIAGNOSIS:   Inadequate oral intake related to lethargy/confusion as evidenced by meal completion < 50%. Ongoing.   GOAL:   Patient will meet greater than or equal to 90% of their needs Progressing  MONITOR:   PO intake, Supplement acceptance  REASON FOR ASSESSMENT:   Ventilator, Consult Enteral/tube feeding initiation and management  ASSESSMENT:   56 y.o. male admits related to AMS. PMH includes: Emphysema, Hep B, HIV, schizophrenia. Pt is currently receiving medical management for AMS. Toxic metabolic encephalopathy and acute hypoxemic and hypercapnic respiratory failure.  Pt admitted for drug overdose now extubated and diet advanced to Regular. Pt working with therapies. Pt tx out of ICU this afternoon.   Medications reviewed and include: folic acid, MVI with minerals, miralax, thiamine  KCl x 1 Mag sulfate x 1 Kphos x 1   Labs reviewed: K 3.4, PO4 2.3   Diet Order:   Diet Order             Diet regular Room service appropriate? Yes; Fluid consistency: Thin  Diet effective now                   EDUCATION NEEDS:   Not appropriate for education at this time  Skin:  Skin Assessment: Reviewed RN Assessment  Last BM:  12/20  Height:   Ht Readings from Last 1 Encounters:  11/02/22 5\' 9"  (1.753 m)    Weight:   Wt Readings from Last 1 Encounters:  11/05/22 62.7 kg   BMI:  Body mass index is 20.41 kg/m.  Estimated Nutritional Needs:   Kcal:  1900-2100  Protein:  90-115 grams  Fluid:  >1.9 L/day  11/07/22., RD, LDN, CNSC See AMiON for contact information

## 2022-11-06 NOTE — Progress Notes (Signed)
SLP Cancellation Note  Patient Details Name: Curtis Hodge MRN: [id number] DOB: 10/05/1966   Cancelled treatment:       Reason Eval/Treat Not Completed: Other (comment). Pt passed [place] yesterday afternoon and started on diet. Will defer SLP eval   Wylodean Shimmel, Riley Nearing 11/06/2022, 8:35 AM

## 2022-11-06 NOTE — Progress Notes (Signed)
eLink Physician-Brief Progress Note Patient Name: Curtis Hodge DOB: 09-25-1966 MRN: [id number]   Date of Service  11/06/2022  HPI/Events of Note  Hypokalemia  Hypophosphatemia  Hypomagnesemia - K+ = 3.4, PO4--- = 2.3, Mg++ = 1.9 and Creatinine = 0.86.  eICU Interventions  Plan: Will replace K+, PO4--- and Mg++.     Intervention Category Major Interventions: Electrolyte abnormality - evaluation and management  Nakyra Bourn Eugene 11/06/2022, 6:17 AM

## 2022-11-07 DIAGNOSIS — R4182 Altered mental status, unspecified: Secondary | ICD-10-CM

## 2022-11-07 LAB — CULTURE, BLOOD (ROUTINE X 2)
Culture: NO GROWTH
Culture: NO GROWTH
Special Requests: ADEQUATE
Special Requests: ADEQUATE
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
## 2022-11-07 NOTE — Progress Notes (Signed)
Consultation Progress Note   Patient: Curtis Hodge YHC:[id number] DOB: 1965/12/29 DOA: 11/01/2022 DOS: the patient was seen and examined on 11/07/2022 Primary service: Barnetta Chapel, MD  Brief hospital course: Aniketh Huberty is a 56 year old male with HIV, shizophrenia, emphyhsema and homelessness who was brought in to the ER via EMS due to loss of consciousness reported by a friend. Per report he was given narcan with improvement in his breathing and mental status.    In the ER, patient was noted to have respiratory acidosis based on ABG and chest x-ray concerning for right mid lung opacities representing aspiration. He was placed on non-rebreather due to oxygen needs. He continued to have increasing O2 needs and altered mentation so he was intubated.  Now extubated, Remains in ICU pending bed availability.  11/07/2022: Patient seen.  No new changes.  Patient is awake and alert.  Patient tells me that his mother may be able to accommodate him.  Will liaise with the transition of care team.  Also communicated same to the patient's nurse.  Pursue disposition:  Assessment and Plan: Toxic Metabolic Enephalopathy: - Polysubstance abuse, would need psych for counseling. 11/07/2022: Encephalopathy has resolved.  Acute Hypoxemic and Hypercapnic Respiratory Failure  -Extubated, now on room air.  Aspiration Pneumonia  -Continues on Antibiotics CXR  shows LLL infiltrate Continue triple therapy nebs  Suptum cx gnr and gpc chains  11/07/2022: Patient has completed course of antibiotics.  Hypokalemia -Replaced by ICU 11/07/2022: Continue to monitor and replete.  Renal panel in the morning.  COPD/Bullous Emphysema  Continue Duonebs Follow up with Pulm OP  Acute Kidney Injury -Resolved   Pancytopenia -Likely in the setting of acute illness and HIV - monitor    HIV -Continue home biktarvy       Subjective:  -No new complaints today. -No fever or chills.   Physical  Exam: Vitals:   11/07/22 0950 11/07/22 0951 11/07/22 1213 11/07/22 1602  BP:   135/84 (!) 155/90  Pulse:   72 70  Resp:   18 18  Temp:   98.1 F (36.7 C) 98.9 F (37.2 C)  TempSrc:   Oral Oral  SpO2: 96% 96% 98% 98%  Weight:      Height:      General: male, Alert in no acute distress HENT: NCAT,  Lungs: Clear Cardiovascular: Regular rate rhythm, S1-S2 Abdomen: Soft, nontender nondistended Extremities: No edema.  Status post amputation of the right forefoot.   Neuro: AO x 3, moves all extremities.    Data Reviewed:   Family Communication: None at bedside  Time spent: 35 minutes.  Author: Barnetta Chapel, MD 11/07/2022 7:23 PM  For on call review [email or web address].

## 2022-11-07 NOTE — Progress Notes (Signed)
Mobility Specialist Progress Note   11/07/22 1814  Mobility  Activity Ambulated with assistance in hallway  Level of Assistance Modified independent, requires aide device or extra time  Assistive Device  (IV pole)  Distance Ambulated (ft) 240 ft  Activity Response Tolerated well  $Mobility charge 1 Mobility   Received pt in bed having no complaints and agreeable to mobility. Pt was asymptomatic throughout ambulation and returned to room w/o fault. Left in bed w/ call bell in reach and all needs met.  Holland Falling Mobility Specialist Please contact via SecureChat or  Rehab office at [phone]

## 2022-11-08 ENCOUNTER — Other Ambulatory Visit: Payer: Self-pay

## 2022-11-08 DIAGNOSIS — R4182 Altered mental status, unspecified: Secondary | ICD-10-CM | POA: Diagnosis not present

## 2022-11-08 LAB — RENAL FUNCTION PANEL
Albumin: 2.4 g/dL — ABNORMAL LOW (ref 3.5–5.0)
Anion gap: 4 — ABNORMAL LOW (ref 5–15)
BUN: 11 mg/dL (ref 6–20)
CO2: 25 mmol/L (ref 22–32)
Calcium: 8.2 mg/dL — ABNORMAL LOW (ref 8.9–10.3)
Chloride: 105 mmol/L (ref 98–111)
Creatinine, Ser: 0.89 mg/dL (ref 0.61–1.24)
GFR, Estimated: >60 mL/min (ref >60)
Glucose, Bld: 92 mg/dL (ref 70–99)
Phosphorus: 3.8 mg/dL (ref 2.5–4.6)
Potassium: 3.7 mmol/L (ref 3.5–5.1)
Sodium: 134 mmol/L — ABNORMAL LOW (ref 135–145)

## 2022-11-08 MED ORDER — ENSURE ENLIVE PO LIQD
237.0000 mL | Freq: Two times a day (BID) | ORAL | 12 refills | Status: DC
  Filled 2022-11-08: qty 237, 1d supply, fill #0

## 2022-11-08 MED ORDER — VITAMIN B-1 100 MG PO TABS
100.0000 mg | ORAL_TABLET | Freq: Every day | ORAL | 0 refills | Status: DC
  Filled 2022-11-08: qty 30, 30d supply, fill #0

## 2022-11-08 MED ORDER — FOLIC ACID 1 MG PO TABS
1.0000 mg | ORAL_TABLET | Freq: Every day | ORAL | 0 refills | Status: AC
  Filled 2022-11-08: qty 30, 30d supply, fill #0

## 2022-11-08 MED ORDER — ADULT MULTIVITAMIN W/MINERALS CH
1.0000 | ORAL_TABLET | Freq: Every day | ORAL | 0 refills | Status: AC
  Filled 2022-11-08: qty 30, 30d supply, fill #0

## 2022-11-08 NOTE — Progress Notes (Signed)
Mobility Specialist Progress Note   11/08/22 1301  Mobility  Activity Ambulated independently in hallway  Level of Assistance Independent after set-up  Assistive Device None  Distance Ambulated (ft) 240 ft  Activity Response Tolerated well  $Mobility charge 1 Mobility   During Mobility: 94 HR  Received pt standing in room w/ no AD having no complaints. Pt ambulating w/ no complaints, x2 bouts of veering to the right but able to self correct. Returned back to room w/o incident and all needs met.    Holland Falling Mobility Specialist Please contact via SecureChat or  Rehab office at [phone]

## 2022-11-08 NOTE — Discharge Summary (Addendum)
Physician Discharge Summary  Patient ID: Curtis Hodge MRN: [id number] DOB/AGE: 1966/08/19 56 y.o.  Admit date: 11/01/2022 Discharge date: 11/08/2022  Admission Diagnoses:  Discharge Diagnoses:  Principal Problem:   AMS (altered mental status) Active Problems:   Acute respiratory failure with hypoxia (HCC)   Acute metabolic encephalopathy   Aspiration pneumonia [facility])   Discharged Condition: stable  Hospital Course: Patient is a 56 year old male with HIV, shizophrenia, emphyhsema and homelessness who was brought in to the ER via EMS due to loss of consciousness reported by a friend. Per report he was given narcan with improvement in his breathing and mental status.    In the ER, patient was noted to have respiratory acidosis based on ABG and chest x-ray concerning for right mid lung opacities representing aspiration. He was placed on non-rebreather due to oxygen needs. He continued to have increasing O2 needs and altered mentation so he was intubated and initially admitted to the ICU team.  Patient has improved significantly and hopes his mother will be able to accommodate him.  Toxic Metabolic Enephalopathy/Encephalopathy due to overdose of cocaine and thc : - Polysubstance abuse, would need psych follow up and counseling on discharge.   Acute Hypoxemic and Hypercapnic Respiratory Failure  -Extubated, now on room air.   Aspiration Pneumonia  -Managed with antibiotics.  CXR revealed left lower lobe infiltrate  -Suptum cx gnr and gpc chains  -Patient completed course of antibiotics.   Hypokalemia -Replaced  -Continue to monitor and replete.    COPD/Bullous Emphysema  Managed with Duonebs Follow up with Pulmonary team on discharge from the hospital.    Acute Kidney Injury -Resolved   Pancytopenia -Likely in the setting of acute illness and HIV - Continue to monitor    HIV -Continue home biktarvy     Consults: None  Discharge Exam: Blood pressure 123/83,  pulse 72, temperature 98.2 F (36.8 C), temperature source Oral, resp. rate 20, height 5\' 9"  (1.753 m), weight 62.7 kg, SpO2 98 %.   Disposition: Discharge disposition: 01-Home or Self Care       Discharge Instructions     Diet - low sodium heart healthy   Complete by: As directed    Increase activity slowly   Complete by: As directed       Allergies as of 11/08/2022   No Known Allergies      Medication List     STOP taking these medications    aspirin EC 81 MG tablet   haloperidol 5 MG tablet Commonly known as: HALDOL   hydrOXYzine 25 MG tablet Commonly known as: ATARAX   nicotine 21 mg/24hr patch Commonly known as: NICODERM CQ - dosed in mg/24 hours   traZODone 100 MG tablet Commonly known as: DESYREL       TAKE these medications    albuterol 108 (90 Base) MCG/ACT inhaler Commonly known as: VENTOLIN HFA Inhale 2 puffs into the lungs every 6 (six) hours as needed for wheezing or shortness of breath.   Alvesco 80 MCG/ACT inhaler Generic drug: ciclesonide Inhale 1 puff into the lungs 2 (two) times daily as needed.   atorvastatin 40 MG tablet Commonly known as: LIPITOR Take 1 tablet (40 mg total) by mouth daily.   Biktarvy 50-200-25 MG Tabs tablet Generic drug: bictegravir-emtricitabine-tenofovir AF Take 1 tablet by mouth daily.   carvedilol 3.125 MG tablet Commonly known as: COREG Take 1 tablet (3.125 mg total) by mouth 2 (two) times daily with a meal.   feeding supplement Liqd  Take 237 mLs by mouth 2 (two) times daily between meals. Start taking on: November 09, 2022   folic acid 1 MG tablet Commonly known as: FOLVITE Take 1 tablet (1 mg total) by mouth daily. Start taking on: November 09, 2022   gabapentin 300 MG capsule Commonly known as: NEURONTIN Take 1 capsule (300 mg total) by mouth 3 (three) times daily.   multivitamin with minerals Tabs tablet Take 1 tablet by mouth daily. Start taking on: November 09, 2022   sertraline 50  MG tablet Commonly known as: ZOLOFT Take 1 tablet (50 mg total) by mouth daily.   Spiriva HandiHaler 18 MCG inhalation capsule Generic drug: tiotropium Place 1 capsule (18 mcg total) into inhaler and inhale daily.   sulfamethoxazole-trimethoprim 800-160 MG tablet Commonly known as: Bactrim DS Take 1 tablet by mouth daily.   thiamine 100 MG tablet Commonly known as: Vitamin B-1 Take 1 tablet (100 mg total) by mouth daily. Start taking on: November 09, 2022               Durable Medical Equipment  (From admission, onward)           Start     Ordered   11/08/22 1431  For home use only DME Cane  Once        11/08/22 1430            Follow-up Information     [facility] Follow [facility].   Specialty: Rehabilitation Why: Rehabilitation will contact you to schedule appointment.  If you do not hear frome them buy 12/27, call above number to schedule your visit Contact information: [address] [id number] [address]c [place] [address] [phone]                Time spent: 63 Minutes  Signed: Barnetta Chapel 11/08/2022, 4:20 PM

## 2022-11-08 NOTE — TOC Progression Note (Signed)
Transition of Care [facility]) - Progression Note    Patient Details  Name: Pietro Bonura MRN: [id number] Date of Birth: [birth year]  Transition [facility]) CM/SW Contact  Eduard Roux, [place] Phone Number: 11/08/2022, 2:25 PM  Clinical Narrative:     CSW  spoke with patient by via phone. CSW introduces self and explain role. Patient confirmed ok to call his brother Delila Pereyra # [phone] to advise he is ready for d/c. Patient states he will go home with brother. CSW offered mental health and substance abuse resources, patient declined , states he has enough resources. Patient requested cane, TOC is aware. no other questions or concerns.   CSW signing off  Antony Blackbird, MSW, LCSW Clinical Social Worker        Barriers to Discharge: Barriers Resolved  Expected Discharge Plan and Services                                               Social Determinants of Health (SDOH) Interventions SDOH Screenings   Alcohol Screen: Low Risk  (10/25/2017)  Depression (PHQ2-9): High Risk (02/07/2022)  Tobacco Use: High Risk (11/01/2022)    Readmission Risk Interventions     No data to display

## 2022-11-08 NOTE — TOC Transition Note (Signed)
Transition of Care [facility]) - CM/SW Discharge Note   Patient Details  Name: Akif Weldy MRN: [id number] Date of Birth: 04/13/1966  [facility]) CM/SW Contact:  Levonne Lapping, RN Phone Number: 11/08/2022, 3:45 PM   Clinical Narrative:    Case Manager met with patient at bedside to discuss transition planning.  Patient will be discharging to his Brother's house @ [address] in [place]. Brother's name is [name], per patient . Floor RN will have Patient call [employer] cab for transportation at bedside when patient is ready.   Brother states he will pay for cab when they arrive at his house. OP PT/OT has been ordered at [facility] location per patients request. No DME has been recommended.   No other TOC needs      Final next level of care: OP Rehab Barriers to Discharge: No Barriers Identified   Patient Goals and CMS Choice CMS Medicare.gov Compare Post Acute Care list provided to:: Patient Choice offered to / list presented to : Patient  Discharge Placement                         Discharge Plan and Services Additional resources added to the After Visit Summary for     Discharge Planning Services: CM Consult Post Acute Care Choice: NA (OP PT and OT)          DME Arranged: N/A DME Agency: NA       HH Arranged: NA HH Agency: NA        Social Determinants of Health (SDOH) Interventions SDOH Screenings   Alcohol Screen: Low Risk  (10/25/2017)  Depression (PHQ2-9): High Risk (02/07/2022)  Tobacco Use: High Risk (11/01/2022)     Readmission Risk Interventions     No data to display

## 2022-11-15 ENCOUNTER — Other Ambulatory Visit: Payer: Self-pay

## 2022-11-21 ENCOUNTER — Emergency Department (HOSPITAL_COMMUNITY): Payer: Medicaid Other

## 2022-11-21 ENCOUNTER — Inpatient Hospital Stay (HOSPITAL_COMMUNITY)
Admission: EM | Admit: 2022-11-21 | Discharge: 2022-12-03 | DRG: 904 | Disposition: A | Discharge status: Home / Self Care | Payer: Medicaid Other | Attending: Internal Medicine | Admitting: Internal Medicine

## 2022-11-21 ENCOUNTER — Other Ambulatory Visit: Payer: Self-pay

## 2022-11-21 DIAGNOSIS — R45850 Homicidal ideations: Secondary | ICD-10-CM

## 2022-11-21 DIAGNOSIS — E785 Hyperlipidemia, unspecified: Secondary | ICD-10-CM | POA: Diagnosis present

## 2022-11-21 DIAGNOSIS — D6959 Other secondary thrombocytopenia: Secondary | ICD-10-CM | POA: Diagnosis present

## 2022-11-21 DIAGNOSIS — G4710 Hypersomnia, unspecified: Secondary | ICD-10-CM | POA: Diagnosis present

## 2022-11-21 DIAGNOSIS — M1990 Unspecified osteoarthritis, unspecified site: Secondary | ICD-10-CM | POA: Diagnosis present

## 2022-11-21 DIAGNOSIS — Z79899 Other long term (current) drug therapy: Secondary | ICD-10-CM

## 2022-11-21 DIAGNOSIS — B20 Human immunodeficiency virus [HIV] disease: Secondary | ICD-10-CM | POA: Diagnosis present

## 2022-11-21 DIAGNOSIS — N179 Acute kidney failure, unspecified: Secondary | ICD-10-CM | POA: Diagnosis not present

## 2022-11-21 DIAGNOSIS — Z89021 Acquired absence of right finger(s): Secondary | ICD-10-CM

## 2022-11-21 DIAGNOSIS — K7460 Unspecified cirrhosis of liver: Secondary | ICD-10-CM | POA: Diagnosis present

## 2022-11-21 DIAGNOSIS — Z6820 Body mass index (BMI) 20.0-20.9, adult: Secondary | ICD-10-CM

## 2022-11-21 DIAGNOSIS — T402X1A Poisoning by other opioids, accidental (unintentional), initial encounter: Principal | ICD-10-CM | POA: Diagnosis present

## 2022-11-21 DIAGNOSIS — E871 Hypo-osmolality and hyponatremia: Secondary | ICD-10-CM | POA: Diagnosis not present

## 2022-11-21 DIAGNOSIS — T7611XA Adult physical abuse, suspected, initial encounter: Secondary | ICD-10-CM | POA: Diagnosis present

## 2022-11-21 DIAGNOSIS — J9601 Acute respiratory failure with hypoxia: Secondary | ICD-10-CM | POA: Diagnosis not present

## 2022-11-21 DIAGNOSIS — G928 Other toxic encephalopathy: Secondary | ICD-10-CM | POA: Diagnosis present

## 2022-11-21 DIAGNOSIS — M86271 Subacute osteomyelitis, right ankle and foot: Secondary | ICD-10-CM

## 2022-11-21 DIAGNOSIS — T40601A Poisoning by unspecified narcotics, accidental (unintentional), initial encounter: Principal | ICD-10-CM

## 2022-11-21 DIAGNOSIS — F1410 Cocaine abuse, uncomplicated: Secondary | ICD-10-CM | POA: Diagnosis present

## 2022-11-21 DIAGNOSIS — F609 Personality disorder, unspecified: Secondary | ICD-10-CM | POA: Diagnosis present

## 2022-11-21 DIAGNOSIS — F1210 Cannabis abuse, uncomplicated: Secondary | ICD-10-CM | POA: Diagnosis present

## 2022-11-21 DIAGNOSIS — T375X6A Underdosing of antiviral drugs, initial encounter: Secondary | ICD-10-CM | POA: Diagnosis present

## 2022-11-21 DIAGNOSIS — M86171 Other acute osteomyelitis, right ankle and foot: Secondary | ICD-10-CM | POA: Diagnosis present

## 2022-11-21 DIAGNOSIS — F1910 Other psychoactive substance abuse, uncomplicated: Secondary | ICD-10-CM | POA: Diagnosis present

## 2022-11-21 DIAGNOSIS — E440 Moderate protein-calorie malnutrition: Secondary | ICD-10-CM | POA: Diagnosis present

## 2022-11-21 DIAGNOSIS — F17210 Nicotine dependence, cigarettes, uncomplicated: Secondary | ICD-10-CM | POA: Diagnosis present

## 2022-11-21 DIAGNOSIS — M869 Osteomyelitis, unspecified: Secondary | ICD-10-CM

## 2022-11-21 DIAGNOSIS — B181 Chronic viral hepatitis B without delta-agent: Secondary | ICD-10-CM

## 2022-11-21 DIAGNOSIS — M109 Gout, unspecified: Secondary | ICD-10-CM | POA: Diagnosis present

## 2022-11-21 DIAGNOSIS — J690 Pneumonitis due to inhalation of food and vomit: Secondary | ICD-10-CM | POA: Diagnosis present

## 2022-11-21 DIAGNOSIS — B351 Tinea unguium: Secondary | ICD-10-CM | POA: Diagnosis present

## 2022-11-21 DIAGNOSIS — M79672 Pain in left foot: Secondary | ICD-10-CM | POA: Diagnosis not present

## 2022-11-21 DIAGNOSIS — G9341 Metabolic encephalopathy: Secondary | ICD-10-CM | POA: Diagnosis present

## 2022-11-21 DIAGNOSIS — F32A Depression, unspecified: Secondary | ICD-10-CM | POA: Diagnosis present

## 2022-11-21 DIAGNOSIS — D649 Anemia, unspecified: Secondary | ICD-10-CM | POA: Diagnosis not present

## 2022-11-21 DIAGNOSIS — D72819 Decreased white blood cell count, unspecified: Secondary | ICD-10-CM | POA: Diagnosis not present

## 2022-11-21 DIAGNOSIS — J439 Emphysema, unspecified: Secondary | ICD-10-CM | POA: Diagnosis present

## 2022-11-21 DIAGNOSIS — Z89421 Acquired absence of other right toe(s): Secondary | ICD-10-CM

## 2022-11-21 LAB — BASIC METABOLIC PANEL
Anion gap: 6 (ref 5–15)
BUN: 11 mg/dL (ref 6–20)
CO2: 26 mmol/L (ref 22–32)
Calcium: 8.4 mg/dL — ABNORMAL LOW (ref 8.9–10.3)
Chloride: 104 mmol/L (ref 98–111)
Creatinine, Ser: 1.02 mg/dL (ref 0.61–1.24)
GFR, Estimated: >60 mL/min (ref >60)
Glucose, Bld: 101 mg/dL — ABNORMAL HIGH (ref 70–99)
Potassium: 4.2 mmol/L (ref 3.5–5.1)
Sodium: 136 mmol/L (ref 135–145)

## 2022-11-21 LAB — ETHANOL: Alcohol, Ethyl (B): <10 mg/dL (ref <10)

## 2022-11-21 LAB — CBC WITH DIFFERENTIAL/PLATELET
Abs Immature Granulocytes: 0.02 10*3/uL (ref 0.00–0.07)
Basophils Absolute: 0 10*3/uL (ref 0.0–0.1)
Basophils Relative: 0 %
Eosinophils Absolute: 0 10*3/uL (ref 0.0–0.5)
Eosinophils Relative: 0 %
HCT: 36.6 % — ABNORMAL LOW (ref 39.0–52.0)
Hemoglobin: 11.8 g/dL — ABNORMAL LOW (ref 13.0–17.0)
Immature Granulocytes: 1 %
Lymphocytes Relative: 24 %
Lymphs Abs: 0.8 10*3/uL (ref 0.7–4.0)
MCH: 32.4 pg (ref 26.0–34.0)
MCHC: 32.2 g/dL (ref 30.0–36.0)
MCV: 100.5 fL — ABNORMAL HIGH (ref 80.0–100.0)
Monocytes Absolute: 0.2 10*3/uL (ref 0.1–1.0)
Monocytes Relative: 5 %
Neutro Abs: 2.1 10*3/uL (ref 1.7–7.7)
Neutrophils Relative %: 70 %
Platelets: 162 10*3/uL (ref 150–400)
RBC: 3.64 MIL/uL — ABNORMAL LOW (ref 4.22–5.81)
RDW: 13.6 % (ref 11.5–15.5)
WBC: 3.1 10*3/uL — ABNORMAL LOW (ref 4.0–10.5)
nRBC: 0 % (ref 0.0–0.2)

## 2022-11-21 LAB — BLOOD GAS, VENOUS
Acid-Base Excess: 5.7 mmol/L — ABNORMAL HIGH (ref 0.0–2.0)
Bicarbonate: 33.4 mmol/L — ABNORMAL HIGH (ref 20.0–28.0)
O2 Saturation: 81.9 %
Patient temperature: 37
pCO2, Ven: 62 mmHg — ABNORMAL HIGH (ref 44–60)
pH, Ven: 7.34 (ref 7.25–7.43)
pO2, Ven: 53 mmHg — ABNORMAL HIGH (ref 32–45)

## 2022-11-21 MED ORDER — NALOXONE HCL 2 MG/2ML IJ SOSY
2.0000 mg | PREFILLED_SYRINGE | Freq: Once | INTRAMUSCULAR | Status: AC
  Administered 2022-11-21: 2 mg via INTRAVENOUS
  Filled 2022-11-21: qty 2

## 2022-11-21 MED ORDER — SODIUM CHLORIDE 0.9 % IV SOLN
3.0000 g | Freq: Once | INTRAVENOUS | Status: AC
  Administered 2022-11-21: 3 g via INTRAVENOUS
  Filled 2022-11-21: qty 8

## 2022-11-21 MED ORDER — NALOXONE HCL 0.4 MG/ML IJ SOLN
0.4000 mg | Freq: Once | INTRAMUSCULAR | Status: AC
  Administered 2022-11-21: 0.4 mg via INTRAVENOUS
  Filled 2022-11-21: qty 1

## 2022-11-21 MED ORDER — NALOXONE HCL 4 MG/10ML IJ SOLN
1.2500 mg/h | INTRAVENOUS | Status: DC
  Administered 2022-11-22 (×3): 1.25 mg/h via INTRAVENOUS
  Filled 2022-11-21 (×5): qty 10
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
## 2022-11-21 NOTE — ED Provider Notes (Signed)
[facility]EM[facility]GENCY DEPT Provider Note   CSN: [id number] Arrival date & time: 11/21/22  1739     History  Chief Complaint  Patient presents with   Drug Overdose    Curtis Hodge is a 57 y.o. male.  57 yo M with a chief complaints of an unintentional opiate overdose.  The patient reportedly thought he was doing cocaine and then ended up being unresponsive.  Per EMS he was bagged then for about 15 minutes and was given 4 mg in total of Narcan with improvement of his mental status.  Patient is a bit sleepy on my exam.  Will wake to voice and painful stimulus.  Localizes pain.   Drug Overdose       Home Medications Prior to Admission medications   Medication Sig Start Date End Date Taking? Authorizing Provider  albuterol (VENTOLIN HFA) 108 (90 Base) MCG/ACT inhaler Inhale 2 puffs into the lungs every 6 (six) hours as needed for wheezing or shortness of breath. 05/28/22   Mayers, Cari S, PA-C  atorvastatin (LIPITOR) 40 MG tablet Take 1 tablet (40 mg total) by mouth daily. 05/28/22   Mayers, Cari S, PA-C  bictegravir-emtricitabine-tenofovir AF (BIKTARVY) 50-200-25 MG TABS tablet Take 1 tablet by mouth daily. 02/07/22   Randall Hiss, MD  carvedilol (COREG) 3.125 MG tablet Take 1 tablet (3.125 mg total) by mouth 2 (two) times daily with a meal. 05/28/22   Mayers, Cari S, PA-C  ciclesonide (ALVESCO) 80 MCG/ACT inhaler Inhale 1 puff into the lungs 2 (two) times daily as needed. 05/28/22   Mayers, Cari S, PA-C  feeding supplement (ENSURE ENLIVE / ENSURE PLUS) LIQD Take 237 mLs by mouth 2 (two) times daily between meals. 11/09/22   Barnetta Chapel, MD  folic acid (FOLVITE) 1 MG tablet Take 1 tablet (1 mg total) by mouth daily. 11/09/22 12/09/22  Barnetta Chapel, MD  gabapentin (NEURONTIN) 300 MG capsule Take 1 capsule (300 mg total) by mouth 3 (three) times daily. 05/28/22   Mayers, Cari S, PA-C  Multiple Vitamin (MULTIVITAMIN WITH MINERALS) TABS tablet  Take 1 tablet by mouth daily. 11/09/22 12/09/22  Berton Mount I, MD  sertraline (ZOLOFT) 50 MG tablet Take 1 tablet (50 mg total) by mouth daily. 05/28/22   Mayers, Cari S, PA-C  sulfamethoxazole-trimethoprim (BACTRIM DS) 800-160 MG tablet Take 1 tablet by mouth daily. 06/06/22   Jennette Kettle, RPH-CPP  thiamine (VITAMIN B-1) 100 MG tablet Take 1 tablet (100 mg total) by mouth daily. 11/09/22 12/09/22  Barnetta Chapel, MD  tiotropium (SPIRIVA HANDIHALER) 18 MCG inhalation capsule Place 1 capsule (18 mcg total) into inhaler and inhale daily. 05/28/22 07/27/22  Mayers, Kasandra Knudsen, PA-C      Allergies    Patient has no known allergies.    Review of Systems   Review of Systems  Physical Exam Updated Vital Signs BP (!) 140/103 (BP Location: Right Arm)   Pulse 75   Temp 98 F (36.7 C) (Oral)   Resp 14   Ht 5\' 9"  (1.753 m)   Wt 62 kg   SpO2 96%   BMI 20.18 kg/m  Physical Exam Vitals and nursing note reviewed.  Constitutional:      Appearance: He is well-developed.  HENT:     Head: Normocephalic and atraumatic.  Eyes:     Pupils: Pupils are equal, round, and reactive to light.  Neck:     Vascular: No JVD.  Cardiovascular:     Rate  and Rhythm: Normal rate and regular rhythm.     Heart sounds: No murmur heard.    No friction rub. No gallop.  Pulmonary:     Effort: No respiratory distress.     Breath sounds: No wheezing.  Abdominal:     General: There is no distension.     Tenderness: There is no abdominal tenderness. There is no guarding or rebound.  Musculoskeletal:        General: Normal range of motion.     Cervical back: Normal range of motion and neck supple.  Skin:    Coloration: Skin is not pale.     Findings: No rash.  Neurological:     Mental Status: He is alert.     Comments: Localizes to pain.  Opens eyes to voice.  Psychiatric:        Behavior: Behavior normal.     ED Results / Procedures / Treatments   Labs (all labs ordered are listed, but only  abnormal results are displayed) Labs Reviewed  CBC WITH DIFFERENTIAL/PLATELET - Abnormal; Notable for the following components:      Result Value   WBC 3.1 (*)    RBC 3.64 (*)    Hemoglobin 11.8 (*)    HCT 36.6 (*)    MCV 100.5 (*)    All other components within normal limits  BASIC METABOLIC PANEL - Abnormal; Notable for the following components:   Glucose, Bld 101 (*)    Calcium 8.4 (*)    All other components within normal limits  BLOOD GAS, VENOUS - Abnormal; Notable for the following components:   pCO2, Ven 62 (*)    pO2, Ven 53 (*)    Bicarbonate 33.4 (*)    Acid-Base Excess 5.7 (*)    All other components within normal limits  ETHANOL    EKG None  Radiology DG Chest Port 1 View  Result Date: 11/21/2022 CLINICAL DATA:  Hypoxia EXAM: PORTABLE CHEST 1 VIEW COMPARISON:  11/04/2022 FINDINGS: Large right apical bulla stable from 11/04/2022. A lesser degree of bullous lung disease is present at the left lung apex. Hazy perihilar and left infrahilar density noted, cannot exclude aspiration pneumonitis or early pneumonia. Heart size within normal limits for projection. IMPRESSION: 1. Hazy perihilar and left infrahilar density, cannot exclude aspiration pneumonitis or early pneumonia. 2. 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 3. Stable large right apical bulla. Electronically Signed   By: Van Clines M.D.   On: 11/21/2022 18:26    Procedures .Critical Care  Performed by: Deno Etienne, DO Authorized by: Deno Etienne, DO   Critical care provider statement:    Critical care time (minutes):  35   Critical care time was exclusive of:  Separately billable procedures and treating other patients   Critical care was time spent personally by me on the following activities:  Development of treatment plan with patient or surrogate, discussions with consultants, evaluation of patient's response to treatment, examination of patient, ordering and review of laboratory  studies, ordering and review of radiographic studies, ordering and performing treatments and interventions, pulse oximetry, re-evaluation of patient's condition and review of old charts   Care discussed with: admitting provider       Medications Ordered in ED Medications  Ampicillin-Sulbactam (UNASYN) 3 g in sodium chloride 0.9 % 100 mL IVPB (has no administration in time range)  naloxone HCl (NARCAN) 4 mg in dextrose 5 % 250 mL infusion (has no administration in time range)  naloxone [facility]) injection 0.4 mg (0.4 mg Intravenous Given 11/21/22 1756)  naloxone [facility]) injection 2 mg (2 mg Intravenous Given 11/21/22 1821)  naloxone [facility]) injection 2 mg (2 mg Intravenous Given 11/21/22 2318)    ED Course/ Medical Decision Making/ A&P                           Medical Decision Making Amount and/or Complexity of Data Reviewed Labs: ordered. Radiology: ordered.  Risk Prescription drug management.   57 yo M with a chief complaints of altered mental status.  Reportedly the patient thought he was doing cocaine and ended up becoming completely unresponsive.  Per EMS report the father had multiple home doses of Narcan that they tried and then EMS ended up establishing IV access and giving him with a estimate is 4 mg in total with improvement.  On my record review the patient actually had a recent admission to the hospital a few weeks ago with something similar.  At that time was noted to have aspirated and required oxygen and intubation.  Patient still having a diminished respiratory rate here.  Will give another dose of Narcan.  Patient's respiratory rate is improved significantly however despite being observed here for 5 hours still is too sleepy to ambulate.  Will give another dose of Narcan.  Check VBG.  Give a dose of Unasyn for aspiration seen on x-ray.  Patient with continued difficulty with mentation.  Will wake up transiently with Narcan and then will become somnolent very quickly.  No  ongoing issues with bradypnea, is still requiring oxygen.  I think may be consistent with aspiration.  Will start on a Narcan infusion.  Will discuss with hospitalist.  The patients results and plan were reviewed and discussed.   Any x-rays performed were independently reviewed by myself.   Differential diagnosis were considered with the presenting HPI.  Medications  Ampicillin-Sulbactam (UNASYN) 3 g in sodium chloride 0.9 % 100 mL IVPB (has no administration in time range)  naloxone HCl (NARCAN) 4 mg in dextrose 5 % 250 mL infusion (has no administration in time range)  naloxone [facility]) injection 0.4 mg (0.4 mg Intravenous Given 11/21/22 1756)  naloxone [facility]) injection 2 mg (2 mg Intravenous Given 11/21/22 1821)  naloxone [facility]) injection 2 mg (2 mg Intravenous Given 11/21/22 2318)    Vitals:   11/21/22 2130 11/21/22 2230 11/21/22 2239 11/21/22 2245  BP: (!) 146/95 (!) 147/103  (!) 140/103  Pulse: 76 76  75  Resp:  12  14  Temp:   98.7 F (37.1 C) 98 F (36.7 C)  TempSrc:   Oral Oral  SpO2: 93% 96%  96%  Weight:      Height:        Final diagnoses:  Opiate overdose, accidental or unintentional, initial encounter [facility])    Admission/ observation were discussed with the admitting physician, patient and/or family and they are comfortable with the plan.            Final Clinical Impression(s) / ED Diagnoses Final diagnoses:  Opiate overdose, accidental or unintentional, initial encounter [facility])    Rx / [place] Orders ED Discharge Orders     None         Deno Etienne, DO 11/21/22 2325

## 2022-11-21 NOTE — ED Notes (Signed)
Two Lt green tops sent to lab

## 2022-11-21 NOTE — ED Triage Notes (Signed)
Pt BIB EMS from friends house. Pt was found unresponsive. 4mg  Narcan given en route.

## 2022-11-21 NOTE — Discharge Instructions (Addendum)
There is help if you need it.  Please do not use dirty needles, this could cause you a severe infection to your skin, heart or spinal cord.  This could kill you or leave you permanently disabled.  There was a recent study done at [facility] that showed that the risk of death for someone that had unintentionally overdosed on narcotics was as high as 15% in the next year.  This is much higher than most every other medical condition.  [facility] Solution to the Opioid Problem (GCSTOP) Fixed; mobile; peer-based Chase Holleman [phone] [email or web address] Fixed site exchange at [facility], Wednesdays (2-5pm) and Thursdays (4-8pm). [place] [place], [place] [address] Call or text to arrange mobile and peer exchange, Mondays (1-4pm) and Fridays (4-7pm). [place] [email or web address]  Suboxone clinic: [facility] [address] [place], [place]  [facility] [place], [place] [address] [facility]) will be operating multiple winter emergency shelter programs, including the [place].  Winter Emergency Shelter ([place]): As of 10/10/2021 the [facility] was full, but a waitlist had been created for if and/or when beds become available. Individuals experiencing homelessness who would like to be referred for the waitlist should contact their assigned Caseworker. If the individual does not have a Caseworker, they should contact the Coordinated Entry Team ([facility]) at [phone] to be assessed for services. Additional information: The length of stay for each individual will be four months (December 2022 - March 2023). The services provided during the stay include: onsite case management, 24/7 security services, food services, and  laundry services. People experiencing street homelessness (who are not already sheltered elsewhere) are eligible for a referral to the [facility] if they meet the following criteria:  Experiencing street homelessness and Elderly (31+ years old), or Have mobility issues, or Examples of Mobility Issues: amputation, stroke, multiple sclerosis, muscular dystrophy, arthritis, etc. Have physical/mental health disabilities Examples of Physical Disabilities: hypertension, diabetes, COPD, etc. Examples of Mental Health Disabilities: depression, anxiety, bipolar disorder, etc. Note: Submitting a referral does not guarantee a bed placement. Individuals will be evaluated to assess their level of acuity, current housing situation, and any barriers to housing. HMIS will also be checked to determine whether the individual is already enrolled in housing programs (such as shelter or transitional housing) or whether the individual is already staying in a hotel/motel setting.   Doorway Project: Entry into [employer] also requires a referral. If necessary, a waitlist will also be created for this program. [place]: The [facility]) began opening as an overnight [facility] on Sunday, October 07, 2021 at 7:00PM. There are no restrictions for entry. [Address: [place], ]     [place] [facility] [place] [facility] [place]'s plan for winter shelter and warming centers is provided below:  Note: There are three separate locations where services will be provided.   [place]

## 2022-11-21 NOTE — ED Notes (Signed)
Pt was unstable while trying ambulate. Unable to take a couple of steps.

## 2022-11-21 NOTE — ED Notes (Signed)
Pt awake and refusing blood work.

## 2022-11-22 ENCOUNTER — Other Ambulatory Visit: Payer: Self-pay

## 2022-11-22 ENCOUNTER — Encounter (HOSPITAL_COMMUNITY): Payer: Self-pay | Admitting: Internal Medicine

## 2022-11-22 DIAGNOSIS — F17210 Nicotine dependence, cigarettes, uncomplicated: Secondary | ICD-10-CM | POA: Diagnosis not present

## 2022-11-22 DIAGNOSIS — E440 Moderate protein-calorie malnutrition: Secondary | ICD-10-CM | POA: Diagnosis present

## 2022-11-22 DIAGNOSIS — K7460 Unspecified cirrhosis of liver: Secondary | ICD-10-CM | POA: Diagnosis present

## 2022-11-22 DIAGNOSIS — M869 Osteomyelitis, unspecified: Secondary | ICD-10-CM | POA: Diagnosis not present

## 2022-11-22 DIAGNOSIS — J9601 Acute respiratory failure with hypoxia: Secondary | ICD-10-CM | POA: Diagnosis present

## 2022-11-22 DIAGNOSIS — J690 Pneumonitis due to inhalation of food and vomit: Secondary | ICD-10-CM | POA: Diagnosis present

## 2022-11-22 DIAGNOSIS — T402X1A Poisoning by other opioids, accidental (unintentional), initial encounter: Secondary | ICD-10-CM | POA: Diagnosis present

## 2022-11-22 DIAGNOSIS — J439 Emphysema, unspecified: Secondary | ICD-10-CM | POA: Diagnosis present

## 2022-11-22 DIAGNOSIS — B181 Chronic viral hepatitis B without delta-agent: Secondary | ICD-10-CM | POA: Diagnosis present

## 2022-11-22 DIAGNOSIS — F32A Depression, unspecified: Secondary | ICD-10-CM | POA: Diagnosis present

## 2022-11-22 DIAGNOSIS — N179 Acute kidney failure, unspecified: Secondary | ICD-10-CM | POA: Diagnosis not present

## 2022-11-22 DIAGNOSIS — D72819 Decreased white blood cell count, unspecified: Secondary | ICD-10-CM | POA: Diagnosis not present

## 2022-11-22 DIAGNOSIS — F609 Personality disorder, unspecified: Secondary | ICD-10-CM | POA: Diagnosis present

## 2022-11-22 DIAGNOSIS — F1210 Cannabis abuse, uncomplicated: Secondary | ICD-10-CM | POA: Diagnosis present

## 2022-11-22 DIAGNOSIS — B351 Tinea unguium: Secondary | ICD-10-CM | POA: Diagnosis present

## 2022-11-22 DIAGNOSIS — M79672 Pain in left foot: Secondary | ICD-10-CM | POA: Diagnosis not present

## 2022-11-22 DIAGNOSIS — F1410 Cocaine abuse, uncomplicated: Secondary | ICD-10-CM | POA: Diagnosis present

## 2022-11-22 DIAGNOSIS — E785 Hyperlipidemia, unspecified: Secondary | ICD-10-CM | POA: Diagnosis present

## 2022-11-22 DIAGNOSIS — G9341 Metabolic encephalopathy: Secondary | ICD-10-CM | POA: Diagnosis not present

## 2022-11-22 DIAGNOSIS — R404 Transient alteration of awareness: Secondary | ICD-10-CM | POA: Diagnosis not present

## 2022-11-22 DIAGNOSIS — M86171 Other acute osteomyelitis, right ankle and foot: Secondary | ICD-10-CM | POA: Diagnosis present

## 2022-11-22 DIAGNOSIS — F418 Other specified anxiety disorders: Secondary | ICD-10-CM | POA: Diagnosis not present

## 2022-11-22 DIAGNOSIS — E871 Hypo-osmolality and hyponatremia: Secondary | ICD-10-CM | POA: Diagnosis not present

## 2022-11-22 DIAGNOSIS — D6959 Other secondary thrombocytopenia: Secondary | ICD-10-CM | POA: Diagnosis present

## 2022-11-22 DIAGNOSIS — R4182 Altered mental status, unspecified: Secondary | ICD-10-CM | POA: Diagnosis not present

## 2022-11-22 DIAGNOSIS — G4710 Hypersomnia, unspecified: Secondary | ICD-10-CM | POA: Diagnosis present

## 2022-11-22 DIAGNOSIS — G928 Other toxic encephalopathy: Secondary | ICD-10-CM | POA: Diagnosis present

## 2022-11-22 DIAGNOSIS — F1910 Other psychoactive substance abuse, uncomplicated: Secondary | ICD-10-CM | POA: Diagnosis not present

## 2022-11-22 DIAGNOSIS — M8618 Other acute osteomyelitis, other site: Secondary | ICD-10-CM | POA: Diagnosis not present

## 2022-11-22 DIAGNOSIS — B20 Human immunodeficiency virus [HIV] disease: Secondary | ICD-10-CM | POA: Diagnosis present

## 2022-11-22 DIAGNOSIS — I10 Essential (primary) hypertension: Secondary | ICD-10-CM | POA: Diagnosis not present

## 2022-11-22 DIAGNOSIS — T7611XA Adult physical abuse, suspected, initial encounter: Secondary | ICD-10-CM | POA: Diagnosis present

## 2022-11-22 DIAGNOSIS — D649 Anemia, unspecified: Secondary | ICD-10-CM | POA: Diagnosis not present

## 2022-11-22 LAB — RAPID URINE DRUG SCREEN, HOSP PERFORMED
Amphetamines: NOT DETECTED
Barbiturates: NOT DETECTED
Benzodiazepines: NOT DETECTED
Cocaine: POSITIVE — AB
Opiates: NOT DETECTED
Tetrahydrocannabinol: POSITIVE — AB

## 2022-11-22 LAB — BASIC METABOLIC PANEL
Anion gap: 5 (ref 5–15)
BUN: 9 mg/dL (ref 6–20)
CO2: 28 mmol/L (ref 22–32)
Calcium: 8.2 mg/dL — ABNORMAL LOW (ref 8.9–10.3)
Chloride: 103 mmol/L (ref 98–111)
Creatinine, Ser: 0.91 mg/dL (ref 0.61–1.24)
GFR, Estimated: >60 mL/min (ref >60)
Glucose, Bld: 97 mg/dL (ref 70–99)
Potassium: 3.8 mmol/L (ref 3.5–5.1)
Sodium: 136 mmol/L (ref 135–145)

## 2022-11-22 LAB — MRSA NEXT GEN BY PCR, NASAL: MRSA by PCR Next Gen: NOT DETECTED

## 2022-11-22 LAB — CBC
HCT: 35.2 % — ABNORMAL LOW (ref 39.0–52.0)
Hemoglobin: 11.3 g/dL — ABNORMAL LOW (ref 13.0–17.0)
MCH: 32.3 pg (ref 26.0–34.0)
MCHC: 32.1 g/dL (ref 30.0–36.0)
MCV: 100.6 fL — ABNORMAL HIGH (ref 80.0–100.0)
Platelets: 143 10*3/uL — ABNORMAL LOW (ref 150–400)
RBC: 3.5 MIL/uL — ABNORMAL LOW (ref 4.22–5.81)
RDW: 13.4 % (ref 11.5–15.5)
WBC: 3.1 10*3/uL — ABNORMAL LOW (ref 4.0–10.5)
nRBC: 0 % (ref 0.0–0.2)

## 2022-11-22 MED ORDER — ACETAMINOPHEN 650 MG RE SUPP: 650.0000 mg | Freq: Four times a day (QID) | RECTAL | Status: DC | PRN

## 2022-11-22 MED ORDER — BICTEGRAVIR-EMTRICITAB-TENOFOV 50-200-25 MG PO TABS
1.0000 | ORAL_TABLET | Freq: Every day | ORAL | Status: DC
  Administered 2022-11-22 – 2022-12-03 (×11): 1 via ORAL
  Filled 2022-11-22 (×14): qty 1

## 2022-11-22 MED ORDER — SULFAMETHOXAZOLE-TRIMETHOPRIM 800-160 MG PO TABS
1.0000 | ORAL_TABLET | Freq: Every day | ORAL | Status: DC
  Administered 2022-11-22 – 2022-11-24 (×3): 1 via ORAL
  Filled 2022-11-22 (×3): qty 1

## 2022-11-22 MED ORDER — ONDANSETRON HCL 4 MG/2ML IJ SOLN: 4.0000 mg | Freq: Four times a day (QID) | INTRAMUSCULAR | Status: DC | PRN

## 2022-11-22 MED ORDER — ONDANSETRON HCL 4 MG PO TABS: 4.0000 mg | ORAL_TABLET | Freq: Four times a day (QID) | ORAL | Status: DC | PRN

## 2022-11-22 MED ORDER — CHLORHEXIDINE GLUCONATE CLOTH 2 % EX PADS
6.0000 | MEDICATED_PAD | Freq: Every day | CUTANEOUS | Status: DC
  Administered 2022-11-23: 6 via TOPICAL

## 2022-11-22 MED ORDER — ATORVASTATIN CALCIUM 40 MG PO TABS
40.0000 mg | ORAL_TABLET | Freq: Every day | ORAL | Status: DC
  Administered 2022-11-22 – 2022-12-03 (×12): 40 mg via ORAL
  Filled 2022-11-22 (×2): qty 1
  Filled 2022-11-22 (×2): qty 2
  Filled 2022-11-22 (×2): qty 1
  Filled 2022-11-22: qty 2
  Filled 2022-11-22 (×3): qty 1
  Filled 2022-11-22: qty 2
  Filled 2022-11-22: qty 1

## 2022-11-22 MED ORDER — ADULT MULTIVITAMIN W/MINERALS CH
1.0000 | ORAL_TABLET | Freq: Every day | ORAL | Status: DC
  Administered 2022-11-22 – 2022-12-02 (×11): 1 via ORAL
  Filled 2022-11-22 (×11): qty 1

## 2022-11-22 MED ORDER — ENSURE ENLIVE PO LIQD
237.0000 mL | Freq: Three times a day (TID) | ORAL | Status: DC
  Administered 2022-11-22 – 2022-12-02 (×29): 237 mL via ORAL

## 2022-11-22 MED ORDER — ACETAMINOPHEN 325 MG PO TABS
650.0000 mg | ORAL_TABLET | Freq: Four times a day (QID) | ORAL | Status: DC | PRN
  Administered 2022-11-22 – 2022-12-02 (×3): 650 mg via ORAL
  Filled 2022-11-22 (×3): qty 2

## 2022-11-22 MED ORDER — SODIUM CHLORIDE 0.9% FLUSH
3.0000 mL | Freq: Two times a day (BID) | INTRAVENOUS | Status: DC
  Administered 2022-11-22 – 2022-12-03 (×22): 3 mL via INTRAVENOUS

## 2022-11-22 MED ORDER — ORAL CARE MOUTH RINSE: 15.0000 mL | OROMUCOSAL | Status: DC | PRN

## 2022-11-22 MED ORDER — ALBUTEROL SULFATE HFA 108 (90 BASE) MCG/ACT IN AERS: 2.0000 | INHALATION_SPRAY | Freq: Four times a day (QID) | RESPIRATORY_TRACT | Status: DC | PRN

## 2022-11-22 MED ORDER — ORAL CARE MOUTH RINSE
15.0000 mL | OROMUCOSAL | Status: DC
  Administered 2022-11-22 – 2022-11-24 (×7): 15 mL via OROMUCOSAL

## 2022-11-22 MED ORDER — SODIUM CHLORIDE 0.9 % IV SOLN
3.0000 g | Freq: Four times a day (QID) | INTRAVENOUS | Status: DC
  Administered 2022-11-22 – 2022-11-23 (×5): 3 g via INTRAVENOUS
  Filled 2022-11-22 (×6): qty 8

## 2022-11-22 MED ORDER — SENNOSIDES-DOCUSATE SODIUM 8.6-50 MG PO TABS: 1.0000 | ORAL_TABLET | Freq: Every evening | ORAL | Status: DC | PRN

## 2022-11-22 MED ORDER — ALBUTEROL SULFATE (2.5 MG/3ML) 0.083% IN NEBU: 2.5000 mg | INHALATION_SOLUTION | Freq: Four times a day (QID) | RESPIRATORY_TRACT | Status: DC | PRN

## 2022-11-22 MED ORDER — ENOXAPARIN SODIUM 40 MG/0.4ML IJ SOSY
40.0000 mg | PREFILLED_SYRINGE | INTRAMUSCULAR | Status: DC
  Administered 2022-11-23 – 2022-12-03 (×11): 40 mg via SUBCUTANEOUS
  Filled 2022-11-22 (×12): qty 0.4

## 2022-11-22 NOTE — Hospital Course (Signed)
Curtis Hodge is a 57 y.o. male with medical history significant for COPD, HIV, schizophrenia, substance use disorder who is admitted with acute hypoxic respiratory failure and toxic metabolic encephalopathy suspected due to unintentional opioid overdose with aspiration pneumonia.

## 2022-11-22 NOTE — Assessment & Plan Note (Signed)
History of cocaine and THC abuse presenting with concern for unintentional opioid overdose after using what he thought was cocaine. -UDS -TOC consult

## 2022-11-22 NOTE — Assessment & Plan Note (Signed)
Toxic metabolic encephalopathy Aspiration pneumonia Suspicion is for unintentional opioid overdose brought in by EMS after becoming unresponsive.  Only briefly awakens after multiple rounds of Narcan now being started on continuous infusion due to persistent hypersomnolence.  CXR concerning for element of aspiration pneumonitis or pneumonia as well. -Admit to stepdown -Continue IV naloxone infusion until patient awakens -Monitor for worsening respiratory depression -Continue IV Unasyn -Continue supplemental O2 as needed -Aspiration precautions -Check UDS

## 2022-11-22 NOTE — Assessment & Plan Note (Signed)
Continue Biktarvy and prophylactic Bactrim.

## 2022-11-22 NOTE — Progress Notes (Signed)
Initial Nutrition Assessment  DOCUMENTATION CODES:   Non-severe (moderate) malnutrition in context of chronic illness  INTERVENTION:  - Continue Regular diet.  - Ensure Plus High Protein po TID, each supplement provides 350 kcal and 20 grams of protein. - Daily multivitamin to support micronutrient needs.  - Monitor weight trends.   NUTRITION DIAGNOSIS:   Moderate Malnutrition related to chronic illness (COPD, HIV, substance use disorder) as evidenced by moderate muscle depletion, moderate fat depletion.  GOAL:   Patient will meet greater than or equal to 90% of their needs  MONITOR:   PO intake, Supplement acceptance, Weight trends  REASON FOR ASSESSMENT:   Malnutrition Screening Tool    ASSESSMENT:   57 y.o. male with medical history significant for COPD, HIV, schizophrenia, substance use disorder who was admitted with acute hypoxic respiratory failure and toxic metabolic encephalopathy suspected due to unintentional opioid overdose with aspiration pneumonia  Patient sleeping at time of visit but awoke to sound of voice. He reports a recent UBW of 165# but notes he weighed around 190# about a year ago. Endorses weight loss since October but unable to specify how much. Per EMR, patient has not been weighed higher than 169# since weight history began in 2012. Patient weighed at 160# back in July and patient now weighed at 137#. This could represent a 23# or 14% weight loss in 6 months, significant for the time frame. However, patient weighed at both both 137# and 147# this admission, so current weight status difficult to assess.  Patient reports he has not been eating well recently due to feeling poorly. States he has been eating 1 meal a day despite having a good appetite and wanting to eat. Drinking Ensure occasionally PTA.  Current appetite is good and patient reports he is eager to eat and gain some weight back. Would like to receive Ensure during admission, requested once  during interview which this RD provided. Encouraged patient to eat well and consume supplements to avoid weight loss during admission.   Medications reviewed and include: -  Labs reviewed:  -   NUTRITION - FOCUSED PHYSICAL EXAM:  Flowsheet Row Most Recent Value  Orbital Region Mild depletion  Upper Arm Region Moderate depletion  Thoracic and Lumbar Region Moderate depletion  Buccal Region Mild depletion  Temple Region Moderate depletion  Clavicle Bone Region Moderate depletion  Clavicle and Acromion Bone Region Moderate depletion  Scapular Bone Region Unable to assess  Dorsal Hand No depletion  Patellar Region Moderate depletion  Anterior Thigh Region Moderate depletion  Posterior Calf Region Moderate depletion  Edema (RD Assessment) None  Hair Reviewed  Eyes Reviewed  Mouth Reviewed  Skin Reviewed  Nails Reviewed       Diet Order:   Diet Order             Diet regular Room service appropriate? Yes; Fluid consistency: Thin  Diet effective now                   EDUCATION NEEDS:  Education needs have been addressed  Skin:  Skin Assessment: Reviewed RN Assessment  Last BM:  PTA  Height:  Ht Readings from Last 1 Encounters:  11/22/22 5\' 10"  (1.778 m)   Weight:  Wt Readings from Last 1 Encounters:  11/21/22 62 kg   Ideal Body Weight:  75.45 kg  BMI:  Body mass index is 20.18 kg/m.  Estimated Nutritional Needs:  Kcal:  2000-2150 kcals Protein:  90-110 grams Fluid:  >/= 2L  Samson Frederic RD, LDN For contact information, refer to [facility].

## 2022-11-22 NOTE — Assessment & Plan Note (Signed)
No obvious wheezing on admission.  Continue albuterol as needed and management as above.

## 2022-11-22 NOTE — TOC Initial Note (Signed)
Transition of Care [facility]) - Initial/Assessment Note    Patient Details  Name: Curtis Hodge MRN: [id number] Date of Birth: [birth year]  Transition [facility]) CM/SW Contact:    Roseanne Kaufman, RN Phone Number: 11/22/2022, 6:09 PM  Clinical Narrative:    [facility] [place] consult for SA resources. This RNCM spoke with patient at bedside, patient reports he would like to get residential SA treatment however does not want to leave his cars and service dog behind. Patient reports his current living arrangement triggers his SA. Patient reports "he is awaiting his disability check and needs help with housing." Patient reports his DSS SW has been trying to help with housing as well. Patient is right foot metatarsal amputee, and had c/o pain while this RNCM at bedside.  Transportation at discharge: patient reports his brother doesn't drive and patient reports will need taxi voucher and indicates he is unable to afford. Patient is is right foot metatarsal amputee.             This RNCM added shelter resources to AVS and provided copies of SA resources to patient at bedside.   TOC will continue to follow.   Expected Discharge Plan: Home/Self Care Barriers to Discharge: Continued Medical Work up   Patient Goals and CMS Choice Patient states their goals for this hospitalization and ongoing recovery are:: Go home with family   Choice offered to / list presented to : Patient      Expected Discharge Plan and Services In-house Referral: NA Discharge Planning Services: CM Consult   Living arrangements for the past 2 months: Single Family Home                 DME Arranged: N/A DME Agency: NA       HH Arranged: NA HH Agency: NA        Prior Living Arrangements/Services Living arrangements for the past 2 months: Single Family Home Lives with:: Siblings Patient language and need for interpreter reviewed:: Yes Do you feel safe going back to the place where you live?: Yes      Need for  Family Participation in Patient Care: No (Comment) Care giver support system in place?: Yes (comment) Current home services: Other (comment) Criminal Activity/Legal Involvement Pertinent to Current Situation/Hospitalization: No - Comment as needed  Activities of Daily Living Home Assistive Devices/Equipment: Cane (specify quad or straight) ADL Screening (condition at time of admission) Patient's cognitive ability adequate to safely complete daily activities?: No Is the patient deaf or have difficulty hearing?: No Does the patient have difficulty seeing, even when wearing glasses/contacts?: No Does the patient have difficulty concentrating, remembering, or making decisions?: Yes Patient able to express need for assistance with ADLs?: Yes Does the patient have difficulty dressing or bathing?: Yes Independently performs ADLs?: No Communication: Independent Is this a change from baseline?: Change from baseline, expected to last <3 days Does the patient have difficulty walking or climbing stairs?: Yes Weakness of Legs: Both Weakness of Arms/Hands: Both  Permission Sought/Granted Permission sought to share information with : Case Manager Permission granted to share information with : Yes, Release of Information Signed  Share Information with NAME: Case Manager  Permission granted to share info w AGENCY: OP PT/OT at [facility]        Emotional Assessment Appearance:: Appears stated age Attitude/Demeanor/Rapport: Engaged, Gracious Affect (typically observed): Accepting Orientation: : Oriented to Self, Oriented to Place, Oriented to  Time, Oriented to Situation Alcohol / Substance Use: Illicit Drugs (marijuana, cocaine,  heroin) Psych Involvement: No (comment)  Admission diagnosis:  Acute respiratory failure with hypoxia ([place]) [J96.01] Opiate overdose, accidental or unintentional, initial encounter ([place]) [T02.409B] Acute metabolic encephalopathy [D53.29] Patient Active Problem List   Diagnosis  Date Noted   Acute metabolic encephalopathy 92/42/6834   Acute respiratory failure with hypoxia ([place]) 19/62/2297   Toxic metabolic encephalopathy 98/92/1194   Aspiration pneumonia ([place]) 11/05/2022   AMS (altered mental status) 11/02/2022   Essential hypertension 05/29/2022   Hyperlipidemia 05/29/2022   Schizophrenia ([place]) 05/29/2022   PTSD (post-traumatic stress disorder) 05/29/2022   Methamphetamine abuse ([place]) 05/29/2022   Cocaine abuse ([place]) 05/29/2022   Syncope and collapse 01/15/2022   Leukopenia 01/15/2022   History of transmetatarsal amputation of right foot ([place]) 11/25/2018   Snoring 08/20/2018   Substance abuse ([place]) 07/22/2018   Homelessness 07/14/2018   Healthcare maintenance 07/14/2018   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   MDD (major depressive disorder) 10/25/2017   Chest pain 08/07/2016   HIV disease ([place]) 02/09/2015   Bullous emphysema ([place]) 02/09/2015   Sciatic pain 08/30/2013   Incarceration    Chronic hepatitis B without delta agent without hepatic coma ([place]) 07/11/2010   CANNABIS ABUSE, EPISODIC 07/11/2010   SMOKER 07/11/2010   PCP:  Pcp, No Pharmacy:   [facility] [address] [place] [address] Phone: [phone] Fax: [phone]  [place], [address] [place] [address] Phone: [phone] Fax: [place], [place] [address] Phone: [phone] Fax: [phone]     Social Determinants of Health (SDOH) Social History: Grace: Unknown (11/22/2022)  Transportation Needs: Unknown (11/22/2022)  Utilities: Unknown (11/22/2022)  Alcohol Screen: Low Risk  (10/25/2017)  Depression (PHQ2-9): High Risk (02/07/2022)  Tobacco Use: High Risk (11/22/2022)    SDOH Interventions:     Readmission Risk Interventions     No data to display

## 2022-11-22 NOTE — Assessment & Plan Note (Signed)
In setting of HIV.  Differential without neutropenia.

## 2022-11-22 NOTE — Progress Notes (Signed)
PROGRESS NOTE    Curtis Hodge  KGM:[id number] DOB: July 17, 1966 DOA: 11/21/2022 PCP: Oneita Hurt, No   Brief Narrative:  Curtis Hodge is a 57 y.o. male with medical history significant for COPD, HIV, schizophrenia, substance use disorder who is admitted with acute hypoxic respiratory failure and toxic metabolic encephalopathy suspected due to unintentional opioid overdose with aspiration pneumonia.    Assessment & Plan:   Principal Problem:   Acute respiratory failure with hypoxia (HCC) Active Problems:   Toxic metabolic encephalopathy   Aspiration pneumonia (HCC)   Bullous emphysema (HCC)   HIV disease (HCC)   Substance abuse (HCC)   Leukopenia  Assessment and Plan:  Acute respiratory failure with hypoxia (HCC) Toxic metabolic encephalopathy Aspiration pneumonia Suspicion is for unintentional opioid overdose brought in by EMS after becoming unresponsive.  Only briefly awakens after multiple rounds of Narcan now being started on continuous infusion due to persistent hypersomnolence.  CXR concerning for element of aspiration pneumonitis or pneumonia as well. -Continue to monitor while off naloxone infusion, starting to awaken -Monitor for worsening respiratory depression -Continue IV Unasyn -Continue supplemental O2 as needed -Aspiration precautions -UDS today for THC and cocaine   Bullous emphysema (HCC) No obvious wheezing on admission.  Continue albuterol as needed and management as above.   Leukopenia In setting of HIV.  Differential without neutropenia.   Substance abuse (HCC) History of cocaine and THC abuse presenting with concern for unintentional opioid overdose after using what he thought was cocaine. -UDS -TOC consult   HIV disease (HCC) Continue Biktarvy and prophylactic Bactrim.  Right foot pain Prior transmetatarsal amputation, follows Dr. Lajoyce Corners and can be seen outpatient No active signs of concern or infection currently noted  DVT  prophylaxis:Lovenox Code Status: Full Family Communication: None at bedside Disposition Plan:  Status is: Observation The patient will require care spanning > 2 midnights and should be moved to inpatient because: Need for IV medications.  Consultants:  None  Procedures:  None  Antimicrobials:  Anti-infectives (From admission, onward)    Start     Dose/Rate Route Frequency Ordered Stop   11/22/22 1000  bictegravir-emtricitabine-tenofovir AF (BIKTARVY) 50-200-25 MG per tablet 1 tablet        1 tablet Oral Daily 11/22/22 0009     11/22/22 1000  sulfamethoxazole-trimethoprim (BACTRIM DS) 800-160 MG per tablet 1 tablet        1 tablet Oral Daily 11/22/22 0009     11/22/22 0600  Ampicillin-Sulbactam (UNASYN) 3 g in sodium chloride 0.9 % 100 mL IVPB        3 g 200 mL/hr over 30 Minutes Intravenous Every 6 hours 11/22/22 0003     11/21/22 2245  Ampicillin-Sulbactam (UNASYN) 3 g in sodium chloride 0.9 % 100 mL IVPB        3 g 200 mL/hr over 30 Minutes Intravenous  Once 11/21/22 2240 11/21/22 2359       Subjective: Patient seen and evaluated today and is starting to become more arousable and alert.  He is asking for breakfast.  Stating that he is having some right foot pain that has been ongoing for quite some time.  He denies any drainage or erythema from this area and usually follows Dr. Lajoyce Corners due to amputation.  Objective: Vitals:   11/22/22 0400 11/22/22 0427 11/22/22 0500 11/22/22 0600  BP:  133/68 134/81 139/76  Pulse:  62 60   Resp:  11 13 11   Temp: 97.8 F (36.6 C)     TempSrc: Axillary  SpO2:  100% 100%   Weight:      Height:        Intake/Output Summary (Last 24 hours) at 11/22/2022 0639 Last data filed at 11/22/2022 0606 Gross per 24 hour  Intake 638.1 ml  Output 700 ml  Net -61.9 ml   Filed Weights   11/21/22 1754 11/22/22 0130  Weight: 62 kg 67.1 kg    Examination:  General exam: Appears calm and comfortable  Respiratory system: Clear to auscultation.  Respiratory effort normal.  Nasal cannula Cardiovascular system: S1 & S2 heard, RRR.  Gastrointestinal system: Abdomen is soft Central nervous system: Alert and awake, intermittently somnolent Extremities: Right foot transmetatarsal amputation with no drainage, warmth, or erythema Skin: No significant lesions noted Psychiatry: Flat affect.    Data Reviewed: I have personally reviewed following labs and imaging studies  CBC: Recent Labs  Lab 11/21/22 1749 11/22/22 0310  WBC 3.1* 3.1*  NEUTROABS 2.1  --   HGB 11.8* 11.3*  HCT 36.6* 35.2*  MCV 100.5* 100.6*  PLT 162 277*   Basic Metabolic Panel: Recent Labs  Lab 11/21/22 2023 11/22/22 0310  NA 136 136  K 4.2 3.8  CL 104 103  CO2 26 28  GLUCOSE 101* 97  BUN 11 9  CREATININE 1.02 0.91  CALCIUM 8.4* 8.2*   GFR: Estimated Creatinine Clearance: 87 mL/min (by C-G formula based on SCr of 0.91 mg/dL). Liver Function Tests: No results for input(s): "AST", "ALT", "ALKPHOS", "BILITOT", "PROT", "ALBUMIN" in the last 168 hours. No results for input(s): "LIPASE", "AMYLASE" in the last 168 hours. No results for input(s): "AMMONIA" in the last 168 hours. Coagulation Profile: No results for input(s): "INR", "PROTIME" in the last 168 hours. Cardiac Enzymes: No results for input(s): "CKTOTAL", "CKMB", "CKMBINDEX", "TROPONINI" in the last 168 hours. BNP (last 3 results) No results for input(s): "PROBNP" in the last 8760 hours. HbA1C: No results for input(s): "HGBA1C" in the last 72 hours. CBG: No results for input(s): "GLUCAP" in the last 168 hours. Lipid Profile: No results for input(s): "CHOL", "HDL", "LDLCALC", "TRIG", "CHOLHDL", "LDLDIRECT" in the last 72 hours. Thyroid Function Tests: No results for input(s): "TSH", "T4TOTAL", "FREET4", "T3FREE", "THYROIDAB" in the last 72 hours. Anemia Panel: No results for input(s): "VITAMINB12", "FOLATE", "FERRITIN", "TIBC", "IRON", "RETICCTPCT" in the last 72 hours. Sepsis Labs: No  results for input(s): "PROCALCITON", "LATICACIDVEN" in the last 168 hours.  Recent Results (from the past 240 hour(s))  MRSA Next Gen by PCR, Nasal     Status: None   Collection Time: 11/22/22 12:23 AM   Specimen: Nasal Mucosa; Nasal Swab  Result Value Ref Range Status   MRSA by PCR Next Gen NOT DETECTED NOT DETECTED Final    Comment: (NOTE) The GeneXpert MRSA Assay (FDA approved for NASAL specimens only), is one component of a comprehensive MRSA colonization surveillance program. It is not intended to diagnose MRSA infection nor to guide or monitor treatment for MRSA infections. Test performance is not FDA approved in patients less than 75 years old. Performed at [facility], [place] [address], [place], [place] [address]          Radiology Studies: [facility] Chest Port 1 View  Result Date: 11/21/2022 CLINICAL DATA:  Hypoxia EXAM: PORTABLE CHEST 1 VIEW COMPARISON:  11/04/2022 FINDINGS: Large right apical bulla stable from 11/04/2022. A lesser degree of bullous lung disease is present at the left lung apex. Hazy perihilar and left infrahilar density noted, cannot exclude aspiration pneumonitis or early pneumonia. Heart  size within normal limits for projection. IMPRESSION: 1. Hazy perihilar and left infrahilar density, cannot exclude aspiration pneumonitis or early pneumonia. 2. 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 3. Stable large right apical bulla. Electronically Signed   By: Van Clines M.D.   On: 11/21/2022 18:26        Scheduled Meds:  atorvastatin  40 mg Oral Daily   bictegravir-emtricitabine-tenofovir AF  1 tablet Oral Daily   Chlorhexidine Gluconate Cloth  6 each Topical Daily   enoxaparin (LOVENOX) injection  40 mg Subcutaneous Q24H   mouth rinse  15 mL Mouth Rinse 4 times per day   sodium chloride flush  3 mL Intravenous Q12H   sulfamethoxazole-trimethoprim  1 tablet Oral Daily   Continuous Infusions:  ampicillin-sulbactam  (UNASYN) IV Stopped (11/22/22 0545)   naloxone HCl (NARCAN) 4 mg in dextrose 5 % 250 mL infusion 1.25 mg/hr (11/22/22 0606)     LOS: 0 days    Time spent: 35 minutes    Rimas Gilham Darleen Crocker, DO [facility]  If 7PM-7AM, please contact night-coverage [email or web address] 11/22/2022, 6:39 AM

## 2022-11-23 ENCOUNTER — Inpatient Hospital Stay (HOSPITAL_COMMUNITY): Payer: Medicaid Other

## 2022-11-23 ENCOUNTER — Other Ambulatory Visit: Payer: Self-pay

## 2022-11-23 DIAGNOSIS — G9341 Metabolic encephalopathy: Secondary | ICD-10-CM

## 2022-11-23 DIAGNOSIS — B20 Human immunodeficiency virus [HIV] disease: Secondary | ICD-10-CM

## 2022-11-23 DIAGNOSIS — J9601 Acute respiratory failure with hypoxia: Secondary | ICD-10-CM | POA: Diagnosis not present

## 2022-11-23 DIAGNOSIS — F1910 Other psychoactive substance abuse, uncomplicated: Secondary | ICD-10-CM | POA: Diagnosis not present

## 2022-11-23 DIAGNOSIS — F17210 Nicotine dependence, cigarettes, uncomplicated: Secondary | ICD-10-CM

## 2022-11-23 LAB — CBC
HCT: 37.8 % — ABNORMAL LOW (ref 39.0–52.0)
Hemoglobin: 12 g/dL — ABNORMAL LOW (ref 13.0–17.0)
MCH: 32.3 pg (ref 26.0–34.0)
MCHC: 31.7 g/dL (ref 30.0–36.0)
MCV: 101.9 fL — ABNORMAL HIGH (ref 80.0–100.0)
Platelets: 116 10*3/uL — ABNORMAL LOW (ref 150–400)
RBC: 3.71 MIL/uL — ABNORMAL LOW (ref 4.22–5.81)
RDW: 13.2 % (ref 11.5–15.5)
WBC: 2.4 10*3/uL — ABNORMAL LOW (ref 4.0–10.5)
nRBC: 0 % (ref 0.0–0.2)

## 2022-11-23 LAB — BASIC METABOLIC PANEL
Anion gap: 6 (ref 5–15)
BUN: 16 mg/dL (ref 6–20)
CO2: 26 mmol/L (ref 22–32)
Calcium: 8.5 mg/dL — ABNORMAL LOW (ref 8.9–10.3)
Chloride: 103 mmol/L (ref 98–111)
Creatinine, Ser: 1.14 mg/dL (ref 0.61–1.24)
GFR, Estimated: >60 mL/min (ref >60)
Glucose, Bld: 90 mg/dL (ref 70–99)
Potassium: 4 mmol/L (ref 3.5–5.1)
Sodium: 135 mmol/L (ref 135–145)

## 2022-11-23 LAB — C-REACTIVE PROTEIN: CRP: 0.8 mg/dL (ref <1.0)

## 2022-11-23 LAB — SEDIMENTATION RATE: Sed Rate: 32 mm/hr — ABNORMAL HIGH (ref 0–16)

## 2022-11-23 LAB — MAGNESIUM: Magnesium: 2 mg/dL (ref 1.7–2.4)

## 2022-11-23 NOTE — Progress Notes (Signed)
PROGRESS NOTE    Curtis Hodge  JQZ:[id number] DOB: 06/14/1966 DOA: 11/21/2022 PCP: Oneita Hurt, No   Brief Narrative:    Pacer Dorn is a 57 y.o. male with medical history significant for COPD, HIV, schizophrenia, substance use disorder who is admitted with acute hypoxic respiratory failure and toxic metabolic encephalopathy suspected due to unintentional opioid overdose with aspiration pneumonia.  He is now awake and alert and complaining of right foot pain with suspicion of fifth metatarsal head osteomyelitis and further evaluation is pending.  Assessment & Plan:   Principal Problem:   Acute respiratory failure with hypoxia (HCC) Active Problems:   Toxic metabolic encephalopathy   Aspiration pneumonia (HCC)   Bullous emphysema (HCC)   HIV disease (HCC)   Substance abuse (HCC)   Leukopenia   Acute metabolic encephalopathy  Assessment and Plan:  Acute respiratory failure with hypoxia (HCC)-resolved Toxic metabolic encephalopathy-resolved Aspiration pneumonia Suspicion is for unintentional opioid overdose brought in by EMS after becoming unresponsive.  Only briefly awakens after multiple rounds of Narcan now being started on continuous infusion due to persistent hypersomnolence.  CXR concerning for element of aspiration pneumonitis or pneumonia as well. -Now off naloxone infusion -Hold Unasyn per ID -Continue supplemental O2 as needed -UDS with THC and cocaine  Right foot pain-concerning for acute osteomyelitis of fifth metatarsal head Prior transmetatarsal amputation, follows Dr. Lajoyce Corners and will need further evaluation and patient Foot x-ray 1/6 with findings concerning of osteomyelitis Obtaining MRI/ABI as well as ESR and CRP Discontinue Unasyn per ID recommendations   Bullous emphysema (HCC) No obvious wheezing on admission.  Continue albuterol as needed and management as above.   Leukopenia In setting of HIV.  Differential without neutropenia.   Substance abuse  (HCC) History of cocaine and THC abuse presenting with concern for unintentional opioid overdose after using what he thought was cocaine. -UDS -TOC consult   HIV disease (HCC) Continue Biktarvy and prophylactic Bactrim.    DVT prophylaxis:Lovenox Code Status: Full Family Communication: None at bedside Disposition Plan:  Status is: Inpatient Remains inpatient appropriate because: Need for IV medications/inpatient procedure.  Consultants:  ID Ortho  Procedures:  None  Antimicrobials:  Anti-infectives (From admission, onward)    Start     Dose/Rate Route Frequency Ordered Stop   11/22/22 1000  bictegravir-emtricitabine-tenofovir AF (BIKTARVY) 50-200-25 MG per tablet 1 tablet        1 tablet Oral Daily 11/22/22 0009     11/22/22 1000  sulfamethoxazole-trimethoprim (BACTRIM DS) 800-160 MG per tablet 1 tablet        1 tablet Oral Daily 11/22/22 0009     11/22/22 0600  Ampicillin-Sulbactam (UNASYN) 3 g in sodium chloride 0.9 % 100 mL IVPB  Status:  Discontinued        3 g 200 mL/hr over 30 Minutes Intravenous Every 6 hours 11/22/22 0003 11/23/22 1058   11/21/22 2245  Ampicillin-Sulbactam (UNASYN) 3 g in sodium chloride 0.9 % 100 mL IVPB        3 g 200 mL/hr over 30 Minutes Intravenous  Once 11/21/22 2240 11/21/22 2359       Subjective: Patient seen and evaluated today and remains alert and awake and at baseline from a mentation standpoint.  He is complaining of worsening pain to his right foot today.  No acute overnight events or concerns noted.  Objective: Vitals:   11/22/22 1819 11/22/22 2218 11/23/22 0242 11/23/22 0557  BP: 138/80 129/65 132/86 136/79  Pulse: 63 61 64 65  Resp: 16  20 20 20   Temp: 98.2 F (36.8 C) 98.4 F (36.9 C) 98 F (36.7 C) 98 F (36.7 C)  TempSrc: Oral Oral Oral Oral  SpO2: 98% 91% 96% 100%  Weight:      Height:        Intake/Output Summary (Last 24 hours) at 11/23/2022 1114 Last data filed at 11/23/2022 0900 Gross per 24 hour  Intake  1295.91 ml  Output 1500 ml  Net -204.09 ml   Filed Weights   11/21/22 1754 11/22/22 0130  Weight: 62 kg 67.1 kg    Examination:  General exam: Appears calm and comfortable  Respiratory system: Clear to auscultation. Respiratory effort normal.  Nasal cannula Cardiovascular system: S1 & S2 heard, RRR.  Gastrointestinal system: Abdomen is soft Central nervous system: Alert and awake, intermittently somnolent Extremities: Right foot transmetatarsal amputation with no drainage, warmth, or erythema Skin: No significant lesions noted Psychiatry: Flat affect.    Data Reviewed: I have personally reviewed following labs and imaging studies  CBC: Recent Labs  Lab 11/21/22 1749 11/22/22 0310 11/23/22 0531  WBC 3.1* 3.1* 2.4*  NEUTROABS 2.1  --   --   HGB 11.8* 11.3* 12.0*  HCT 36.6* 35.2* 37.8*  MCV 100.5* 100.6* 101.9*  PLT 162 143* 116*   Basic Metabolic Panel: Recent Labs  Lab 11/21/22 2023 11/22/22 0310 11/23/22 0531  NA 136 136 135  K 4.2 3.8 4.0  CL 104 103 103  CO2 26 28 26   GLUCOSE 101* 97 90  BUN 11 9 16   CREATININE 1.02 0.91 1.14  CALCIUM 8.4* 8.2* 8.5*  MG  --   --  2.0   GFR: Estimated Creatinine Clearance: 69.5 mL/min (by C-G formula based on SCr of 1.14 mg/dL). Liver Function Tests: No results for input(s): "AST", "ALT", "ALKPHOS", "BILITOT", "PROT", "ALBUMIN" in the last 168 hours. No results for input(s): "LIPASE", "AMYLASE" in the last 168 hours. No results for input(s): "AMMONIA" in the last 168 hours. Coagulation Profile: No results for input(s): "INR", "PROTIME" in the last 168 hours. Cardiac Enzymes: No results for input(s): "CKTOTAL", "CKMB", "CKMBINDEX", "TROPONINI" in the last 168 hours. BNP (last 3 results) No results for input(s): "PROBNP" in the last 8760 hours. HbA1C: No results for input(s): "HGBA1C" in the last 72 hours. CBG: No results for input(s): "GLUCAP" in the last 168 hours. Lipid Profile: No results for input(s): "CHOL",  "HDL", "LDLCALC", "TRIG", "CHOLHDL", "LDLDIRECT" in the last 72 hours. Thyroid Function Tests: No results for input(s): "TSH", "T4TOTAL", "FREET4", "T3FREE", "THYROIDAB" in the last 72 hours. Anemia Panel: No results for input(s): "VITAMINB12", "FOLATE", "FERRITIN", "TIBC", "IRON", "RETICCTPCT" in the last 72 hours. Sepsis Labs: No results for input(s): "PROCALCITON", "LATICACIDVEN" in the last 168 hours.  Recent Results (from the past 240 hour(s))  MRSA Next Gen by PCR, Nasal     Status: None   Collection Time: 11/22/22 12:23 AM   Specimen: Nasal Mucosa; Nasal Swab  Result Value Ref Range Status   MRSA by PCR Next Gen NOT DETECTED NOT DETECTED Final    Comment: (NOTE) The GeneXpert MRSA Assay (FDA approved for NASAL specimens only), is one component of a comprehensive MRSA colonization surveillance program. It is not intended to diagnose MRSA infection nor to guide or monitor treatment for MRSA infections. Test performance is not FDA approved in patients less than 51 years old. Performed at [facility], [address], [place], [place]          Radiology Studies: DG  Foot Complete Right  Result Date: 11/23/2022 CLINICAL DATA:  Pain EXAM: RIGHT FOOT COMPLETE - 3+ VIEW COMPARISON:  01/14/2022 FINDINGS: There is previous transmetatarsal amputation. There is slightly mottled appearance in the distal end of fifth metatarsal. Which was not seen in the previous examination. No recent fracture or dislocation is seen. Arterial calcifications are seen in soft tissues. IMPRESSION: Previous transmetatarsal amputation. There is interval appearance of slightly mottled appearance in the distal end of fifth metatarsal. Possibility of osteomyelitis should be considered. Follow-up MRI as clinically warranted should be considered. Electronically Signed   By: Elmer Picker M.D.   On: 11/23/2022 10:32   DG Chest Port 1 View  Result Date: 11/21/2022 CLINICAL DATA:   Hypoxia EXAM: PORTABLE CHEST 1 VIEW COMPARISON:  11/04/2022 FINDINGS: Large right apical bulla stable from 11/04/2022. A lesser degree of bullous lung disease is present at the left lung apex. Hazy perihilar and left infrahilar density noted, cannot exclude aspiration pneumonitis or early pneumonia. Heart size within normal limits for projection. IMPRESSION: 1. Hazy perihilar and left infrahilar density, cannot exclude aspiration pneumonitis or early pneumonia. 2. 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 3. Stable large right apical bulla. Electronically Signed   By: Van Clines M.D.   On: 11/21/2022 18:26        Scheduled Meds:  atorvastatin  40 mg Oral Daily   bictegravir-emtricitabine-tenofovir AF  1 tablet Oral Daily   Chlorhexidine Gluconate Cloth  6 each Topical Daily   enoxaparin (LOVENOX) injection  40 mg Subcutaneous Q24H   feeding supplement  237 mL Oral TID BM   multivitamin with minerals  1 tablet Oral Daily   mouth rinse  15 mL Mouth Rinse 4 times per day   sodium chloride flush  3 mL Intravenous Q12H   sulfamethoxazole-trimethoprim  1 tablet Oral Daily       LOS: 1 day    Time spent: 35 minutes    Lexa Coronado Darleen Crocker, DO [facility]  If 7PM-7AM, please contact night-coverage [email or web address] 11/23/2022, 11:14 AM
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
## 2022-11-24 ENCOUNTER — Encounter (HOSPITAL_COMMUNITY): Payer: Self-pay | Admitting: Internal Medicine

## 2022-11-24 ENCOUNTER — Inpatient Hospital Stay (HOSPITAL_COMMUNITY): Payer: Medicaid Other

## 2022-11-24 DIAGNOSIS — J439 Emphysema, unspecified: Secondary | ICD-10-CM

## 2022-11-24 DIAGNOSIS — G928 Other toxic encephalopathy: Secondary | ICD-10-CM

## 2022-11-24 DIAGNOSIS — M869 Osteomyelitis, unspecified: Secondary | ICD-10-CM | POA: Diagnosis not present

## 2022-11-24 DIAGNOSIS — J9601 Acute respiratory failure with hypoxia: Secondary | ICD-10-CM | POA: Diagnosis not present

## 2022-11-24 DIAGNOSIS — B20 Human immunodeficiency virus [HIV] disease: Secondary | ICD-10-CM | POA: Diagnosis not present

## 2022-11-24 DIAGNOSIS — G9341 Metabolic encephalopathy: Secondary | ICD-10-CM | POA: Diagnosis not present

## 2022-11-24 LAB — C-REACTIVE PROTEIN: CRP: 1.2 mg/dL — ABNORMAL HIGH (ref <1.0)

## 2022-11-24 LAB — SEDIMENTATION RATE: Sed Rate: 37 mm/hr — ABNORMAL HIGH (ref 0–16)

## 2022-11-24 MED ORDER — GADOBUTROL 1 MMOL/ML IV SOLN
6.0000 mL | Freq: Once | INTRAVENOUS | Status: AC | PRN
  Administered 2022-11-24: 6 mL via INTRAVENOUS

## 2022-11-24 NOTE — Plan of Care (Signed)
  Problem: Activity: Goal: Risk for activity intolerance will decrease Outcome: Progressing   Problem: Nutrition: Goal: Adequate nutrition will be maintained Outcome: Progressing   

## 2022-11-24 NOTE — Progress Notes (Signed)
PROGRESS NOTE    Curtis Hodge  [id number] DOB: 25-Dec-1965 DOA: 11/21/2022 PCP: Merryl Hacker, No   Brief Narrative:    Curtis Hodge is a 57 y.o. male with medical history significant for COPD, HIV, schizophrenia, substance use disorder who is admitted with acute hypoxic respiratory failure and toxic metabolic encephalopathy suspected due to unintentional opioid overdose with aspiration pneumonia.  He is now awake and alert and complaining of right foot pain with suspicion of fifth metatarsal head osteomyelitis and further evaluation is pending with MRI.  Dr. Sharol Given notified of patient requiring evaluation.  ID following.  1/7 he is also complaining of left foot pain and there appears to be a bony abnormality for which x-ray will be obtained as well.  Assessment & Plan:   Principal Problem:   Acute respiratory failure with hypoxia (HCC) Active Problems:   Toxic metabolic encephalopathy   Aspiration pneumonia (HCC)   Bullous emphysema (HCC)   HIV disease (HCC)   Substance abuse ([place])   Leukopenia   Acute metabolic encephalopathy  Assessment and Plan:  Acute respiratory failure with hypoxia (HCC)-resolved Toxic metabolic encephalopathy-resolved Aspiration pneumonia Suspicion is for unintentional opioid overdose brought in by EMS after becoming unresponsive.  Only briefly awakens after multiple rounds of Narcan now being started on continuous infusion due to persistent hypersomnolence.  CXR concerning for element of aspiration pneumonitis or pneumonia as well. -Now off naloxone infusion -Hold further Unasyn per ID, discontinued 1/6 -Continue supplemental O2 as needed -UDS with THC and cocaine  Right foot pain-concerning for acute osteomyelitis of fifth metatarsal head Prior transmetatarsal amputation, follows Dr. Sharol Given and will need further evaluation and patient, Dr. Sharol Given notified Foot x-ray 1/6 with findings concerning of osteomyelitis Obtaining MRI/ABI as well as ESR and  CRP Delay in obtaining MRI due to [facility] MRI not functioning this weekend Discontinue Unasyn and Bactrim per ID recommendations   Bullous emphysema ([place]) No obvious wheezing on admission.  Continue albuterol as needed and management as above.   Leukopenia In setting of HIV.  Differential without neutropenia.   Substance abuse ([place]) History of cocaine and THC abuse presenting with concern for unintentional opioid overdose after using what he thought was cocaine. -UDS -TOC consult   HIV disease ([place]) Continue Biktarvy and prophylactic Bactrim.  DVT prophylaxis:Lovenox Code Status: Full Family Communication: None at bedside Disposition Plan:  Status is: Inpatient Remains inpatient appropriate because: Need for IV medications/inpatient procedure.  Consultants:  ID Ortho Dr. Sharol Given  Procedures:  None  Antimicrobials:  Anti-infectives (From admission, onward)    Start     Dose/Rate Route Frequency Ordered Stop   11/22/22 1000  bictegravir-emtricitabine-tenofovir AF (BIKTARVY) 50-200-25 MG per tablet 1 tablet        1 tablet Oral Daily 11/22/22 0009     11/22/22 1000  sulfamethoxazole-trimethoprim (BACTRIM DS) 800-160 MG per tablet 1 tablet        1 tablet Oral Daily 11/22/22 0009     11/22/22 0600  Ampicillin-Sulbactam (UNASYN) 3 g in sodium chloride 0.9 % 100 mL IVPB  Status:  Discontinued        3 g 200 mL/hr over 30 Minutes Intravenous Every 6 hours 11/22/22 0003 11/23/22 1058   11/21/22 2245  Ampicillin-Sulbactam (UNASYN) 3 g in sodium chloride 0.9 % 100 mL IVPB        3 g 200 mL/hr over 30 Minutes Intravenous  Once 11/21/22 2240 11/21/22 2359       Subjective: Patient seen and evaluated today  and remains alert and awake and at baseline from a mentation standpoint.  He is complaining of some pain to his left foot today and identifies a bony abnormality that he was not previously aware of.  He denies any significant pain with ambulation.  Awaiting MRI of his right  foot today with delay as machine at [facility] long has been down.  Objective: Vitals:   11/23/22 0557 11/23/22 1257 11/23/22 2148 11/24/22 0545  BP: 136/79 (!) 144/89 139/76 (!) 146/84  Pulse: 65 73 66 70  Resp: 20 18 20 18   Temp: 98 F (36.7 C) 98.3 F (36.8 C) 98.5 F (36.9 C) 97.8 F (36.6 C)  TempSrc: Oral Oral Oral Oral  SpO2: 100% 100% 97% 99%  Weight:      Height:        Intake/Output Summary (Last 24 hours) at 11/24/2022 0951 Last data filed at 11/24/2022 0807 Gross per 24 hour  Intake 480 ml  Output --  Net 480 ml   Filed Weights   11/21/22 1754 11/22/22 0130  Weight: 62 kg 67.1 kg    Examination:  General exam: Appears calm and comfortable  Respiratory system: Clear to auscultation. Respiratory effort normal.  Nasal cannula Cardiovascular system: S1 & S2 heard, RRR.  Gastrointestinal system: Abdomen is soft Central nervous system: Alert and awake, intermittently somnolent Extremities: Right foot transmetatarsal amputation with no drainage, warmth, or erythema, left foot bony abnormality over the lateral aspect near lateral malleolus.  Nontender. Skin: No significant lesions noted Psychiatry: Flat affect.    Data Reviewed: I have personally reviewed following labs and imaging studies  CBC: Recent Labs  Lab 11/21/22 1749 11/22/22 0310 11/23/22 0531  WBC 3.1* 3.1* 2.4*  NEUTROABS 2.1  --   --   HGB 11.8* 11.3* 12.0*  HCT 36.6* 35.2* 37.8*  MCV 100.5* 100.6* 101.9*  PLT 162 143* 974*   Basic Metabolic Panel: Recent Labs  Lab 11/21/22 2023 11/22/22 0310 11/23/22 0531  NA 136 136 135  K 4.2 3.8 4.0  CL 104 103 103  CO2 26 28 26   GLUCOSE 101* 97 90  BUN 11 9 16   CREATININE 1.02 0.91 1.14  CALCIUM 8.4* 8.2* 8.5*  MG  --   --  2.0   GFR: Estimated Creatinine Clearance: 69.5 mL/min (by C-G formula based on SCr of 1.14 mg/dL). Liver Function Tests: No results for input(s): "AST", "ALT", "ALKPHOS", "BILITOT", "PROT", "ALBUMIN" in the last 168  hours. No results for input(s): "LIPASE", "AMYLASE" in the last 168 hours. No results for input(s): "AMMONIA" in the last 168 hours. Coagulation Profile: No results for input(s): "INR", "PROTIME" in the last 168 hours. Cardiac Enzymes: No results for input(s): "CKTOTAL", "CKMB", "CKMBINDEX", "TROPONINI" in the last 168 hours. BNP (last 3 results) No results for input(s): "PROBNP" in the last 8760 hours. HbA1C: No results for input(s): "HGBA1C" in the last 72 hours. CBG: No results for input(s): "GLUCAP" in the last 168 hours. Lipid Profile: No results for input(s): "CHOL", "HDL", "LDLCALC", "TRIG", "CHOLHDL", "LDLDIRECT" in the last 72 hours. Thyroid Function Tests: No results for input(s): "TSH", "T4TOTAL", "FREET4", "T3FREE", "THYROIDAB" in the last 72 hours. Anemia Panel: No results for input(s): "VITAMINB12", "FOLATE", "FERRITIN", "TIBC", "IRON", "RETICCTPCT" in the last 72 hours. Sepsis Labs: No results for input(s): "PROCALCITON", "LATICACIDVEN" in the last 168 hours.  Recent Results (from the past 240 hour(s))  MRSA Next Gen by PCR, Nasal     Status: None   Collection Time: 11/22/22 12:23 AM  Specimen: Nasal Mucosa; Nasal Swab  Result Value Ref Range Status   MRSA by PCR Next Gen NOT DETECTED NOT DETECTED Final    Comment: (NOTE) The GeneXpert MRSA Assay (FDA approved for NASAL specimens only), is one component of a comprehensive MRSA colonization surveillance program. It is not intended to diagnose MRSA infection nor to guide or monitor treatment for MRSA infections. Test performance is not FDA approved in patients less than 39 years old. Performed at [facility] [address] [place], [place] [address]          Radiology Studies: US Abdomen Limited RUQ (LIVER/GB)  Result Date: 11/23/2022 CLINICAL DATA:  Chronic viral hepatitis-B. EXAM: ULTRASOUND ABDOMEN LIMITED RIGHT UPPER QUADRANT COMPARISON:  CT abdomen and pelvis 06/26/2018 FINDINGS:  Gallbladder: No gallstones or wall thickening visualized. No sonographic Murphy sign noted by sonographer. Common bile duct: Diameter: 4 mm Liver: No focal lesion identified. Decreased in parenchymal echogenicity. There is nodular liver contour. Portal vein is patent on color Doppler imaging with normal direction of blood flow towards the liver. Other: None. IMPRESSION: 1. Cirrhotic liver morphology. No focal liver lesion identified. 2. No cholelithiasis or sonographic evidence of acute cholecystitis. Electronically Signed   By: Darliss Cheney M.D.   On: 11/23/2022 19:56   DG Foot Complete Right  Result Date: 11/23/2022 CLINICAL DATA:  Pain EXAM: RIGHT FOOT COMPLETE - 3+ VIEW COMPARISON:  01/14/2022 FINDINGS: There is previous transmetatarsal amputation. There is slightly mottled appearance in the distal end of fifth metatarsal. Which was not seen in the previous examination. No recent fracture or dislocation is seen. Arterial calcifications are seen in soft tissues. IMPRESSION: Previous transmetatarsal amputation. There is interval appearance of slightly mottled appearance in the distal end of fifth metatarsal. Possibility of osteomyelitis should be considered. Follow-up MRI as clinically warranted should be considered. Electronically Signed   By: Ernie Avena M.D.   On: 11/23/2022 10:32        Scheduled Meds:  atorvastatin  40 mg Oral Daily   bictegravir-emtricitabine-tenofovir AF  1 tablet Oral Daily   enoxaparin (LOVENOX) injection  40 mg Subcutaneous Q24H   feeding supplement  237 mL Oral TID BM   multivitamin with minerals  1 tablet Oral Daily   mouth rinse  15 mL Mouth Rinse 4 times per day   sodium chloride flush  3 mL Intravenous Q12H   sulfamethoxazole-trimethoprim  1 tablet Oral Daily       LOS: 2 days    Time spent: 35 minutes    Sherry Blackard Hoover Brunette, DO [facility]  If 7PM-7AM, please contact night-coverage [email or web address] 11/24/2022, 9:51 AM
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
## 2022-11-25 ENCOUNTER — Encounter (HOSPITAL_COMMUNITY): Payer: Medicaid Other

## 2022-11-25 DIAGNOSIS — M8618 Other acute osteomyelitis, other site: Secondary | ICD-10-CM

## 2022-11-25 DIAGNOSIS — M869 Osteomyelitis, unspecified: Secondary | ICD-10-CM | POA: Diagnosis not present

## 2022-11-25 LAB — CBC
HCT: 43.3 % (ref 39.0–52.0)
Hemoglobin: 13.7 g/dL (ref 13.0–17.0)
MCH: 32.5 pg (ref 26.0–34.0)
MCHC: 31.6 g/dL (ref 30.0–36.0)
MCV: 102.9 fL — ABNORMAL HIGH (ref 80.0–100.0)
Platelets: 127 10*3/uL — ABNORMAL LOW (ref 150–400)
RBC: 4.21 MIL/uL — ABNORMAL LOW (ref 4.22–5.81)
RDW: 13 % (ref 11.5–15.5)
WBC: 2.9 10*3/uL — ABNORMAL LOW (ref 4.0–10.5)
nRBC: 0 % (ref 0.0–0.2)

## 2022-11-25 LAB — BASIC METABOLIC PANEL
Anion gap: 4 — ABNORMAL LOW (ref 5–15)
BUN: 14 mg/dL (ref 6–20)
CO2: 26 mmol/L (ref 22–32)
Calcium: 8.6 mg/dL — ABNORMAL LOW (ref 8.9–10.3)
Chloride: 103 mmol/L (ref 98–111)
Creatinine, Ser: 1.06 mg/dL (ref 0.61–1.24)
GFR, Estimated: >60 mL/min (ref >60)
Glucose, Bld: 100 mg/dL — ABNORMAL HIGH (ref 70–99)
Potassium: 4.1 mmol/L (ref 3.5–5.1)
Sodium: 133 mmol/L — ABNORMAL LOW (ref 135–145)

## 2022-11-25 LAB — MAGNESIUM: Magnesium: 1.8 mg/dL (ref 1.7–2.4)

## 2022-11-25 LAB — SEDIMENTATION RATE: Sed Rate: 38 mm/hr — ABNORMAL HIGH (ref 0–16)

## 2022-11-25 LAB — HIV-1 RNA QUANT-NO REFLEX-BLD
HIV 1 RNA Quant: 13300 copies/mL
LOG10 HIV-1 RNA: 4.124 log10copy/mL

## 2022-11-25 LAB — C-REACTIVE PROTEIN: CRP: 1.2 mg/dL — ABNORMAL HIGH (ref <1.0)

## 2022-11-25 NOTE — Plan of Care (Signed)
  Problem: Education: Goal: Knowledge of General Education information will improve Description Including pain rating scale, medication(s)/side effects and non-pharmacologic comfort measures Outcome: Progressing   Problem: Health Behavior/Discharge Planning: Goal: Ability to manage health-related needs will improve Outcome: Progressing   
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
## 2022-11-25 NOTE — Progress Notes (Signed)
PROGRESS NOTE  Curtis Hodge  [id number] DOB: 1966/03/28 DOA: 11/21/2022 PCP: Pcp, No   Brief Narrative: Patient is a 57 year old male with history of COPD, HIV, schizophrenia, substance use disorder who presented to the emergency department on 11/21/2022 after he was found to be unresponsive at his friend's house.  Encephalopathic on presentation.  Found that he was using cocaine and subsequently became unresponsive, given Narcan on presentation, remained somnolent on ED.  Mental status significantly improved now, started having right foot pain.  Found to have fifth metatarsal head osteomyelitis.  ID following.  Orthopedics consulted, plan for fifth ray amputation on Wednesday.  Patient will be transferred to [facility].  ID also following.  Assessment & Plan:  Principal Problem:   Osteomyelitis of fifth toe of right foot (HCC) Active Problems:   Acute respiratory failure with hypoxia (HCC)   Toxic metabolic encephalopathy   Aspiration pneumonia (HCC)   Bullous emphysema (HCC)   HIV disease (HCC)   Substance abuse ([place])   Leukopenia   Acute metabolic encephalopathy   Acute respiratory failure with hypoxia/toxic metabolic encephalopathy from cocaine ingestion: Found to be unresponsive and brought to the ED.  Encephalopathic on presentation.  Likely secondary to opioid overdose, history of cocaine abuse.  Given multiple rounds of Narcan.  UDS positive for THC and cocaine.  Currently alert and oriented.  On room air  Aspiration pneumonia: Suspected on presentation, was given Unasyn.  Antibiotics has been discontinued.  On room air  Osteomyelitis of right fifth metatarsal head: History of prior transmetatarsal amputation.  Follows with Dr. Sharol Given.  Foot x-ray on 1/6 was concerning for osteomyelitis.  MRI was positive for osteomyelitis showing bone marrow edema of the distal aspect of the fifth metatarsal .  ID was consulted.  Antibiotics on hold.  Dr. Sharol Given planning for fifth ray  amputation on wednesday  History of emphysema/COPD: Currently respiratory status stable, continue bronchodilators as needed  Leukopenia/thrombocytopenia: In the setting of HIV.  Continue to monitor  History of HIV: On Biktarvy, prophylactic Bactrim  History of hyperlipidemia: On Lipitor  Substance abuse: Takes cocaine, THC.  TOC consulted     Nutrition Problem: Moderate Malnutrition Etiology: chronic illness (COPD, HIV, substance use disorder)    DVT prophylaxis:enoxaparin (LOVENOX) injection 40 mg Start: 11/22/22 0015     Code Status: Full Code  Family Communication: None at bedside  Patient status: Inpatient  Patient is from : Home  Anticipated discharge to: Home  Estimated DC date: Not sure   Consultants: ID, Ortho   Procedures: None yet  Antimicrobials:  Anti-infectives (From admission, onward)    Start     Dose/Rate Route Frequency Ordered Stop   11/22/22 1000  bictegravir-emtricitabine-tenofovir AF (BIKTARVY) 50-200-25 MG per tablet 1 tablet        1 tablet Oral Daily 11/22/22 0009     11/22/22 1000  sulfamethoxazole-trimethoprim (BACTRIM DS) 800-160 MG per tablet 1 tablet  Status:  Discontinued        1 tablet Oral Daily 11/22/22 0009 11/24/22 1316   11/22/22 0600  Ampicillin-Sulbactam (UNASYN) 3 g in sodium chloride 0.9 % 100 mL IVPB  Status:  Discontinued        3 g 200 mL/hr over 30 Minutes Intravenous Every 6 hours 11/22/22 0003 11/23/22 1058   11/21/22 2245  Ampicillin-Sulbactam (UNASYN) 3 g in sodium chloride 0.9 % 100 mL IVPB        3 g 200 mL/hr over 30 Minutes Intravenous  Once 11/21/22 2240 11/21/22 2359  Subjective: Patient seen and examined at bedside today.  Hemodynamically stable.  Comfortable.  Talking on the phone.  Not in any Distress.  Objective: Vitals:   11/24/22 1315 11/24/22 2058 11/25/22 0603 11/25/22 0820  BP: (!) 153/91 (!) 153/105 (!) 143/89 116/86  Pulse: 74 79 65 82  Resp: 20 20 20 20   Temp: 99.3 F (37.4 C)  98.8 F (37.1 C) 98.2 F (36.8 C) 98.2 F (36.8 C)  TempSrc: Oral   Oral  SpO2: 99% 100% 99% 99%  Weight:      Height:        Intake/Output Summary (Last 24 hours) at 11/25/2022 1109 Last data filed at 11/25/2022 01/24/2023 Gross per 24 hour  Intake 723 ml  Output 350 ml  Net 373 ml   Filed Weights   11/21/22 1754 11/22/22 0130  Weight: 62 kg 67.1 kg    Examination:  General exam: Overall comfortable, not in distress HEENT: PERRL Respiratory system:  no wheezes or crackles  Cardiovascular system: S1 & S2 heard, RRR.  Gastrointestinal system: Abdomen is nondistended, soft and nontender. Central nervous system: Alert and oriented Extremities: No edema, no clubbing ,no cyanosis, right transmetatarsal amputation Skin: No rashes, no ulcers,no icterus     Data Reviewed: I have personally reviewed following labs and imaging studies  CBC: Recent Labs  Lab 11/21/22 1749 11/22/22 0310 11/23/22 0531 11/25/22 0604  WBC 3.1* 3.1* 2.4* 2.9*  NEUTROABS 2.1  --   --   --   HGB 11.8* 11.3* 12.0* 13.7  HCT 36.6* 35.2* 37.8* 43.3  MCV 100.5* 100.6* 101.9* 102.9*  PLT 162 143* 116* 127*   Basic Metabolic Panel: Recent Labs  Lab 11/21/22 2023 11/22/22 0310 11/23/22 0531 11/25/22 0604  NA 136 136 135 133*  K 4.2 3.8 4.0 4.1  CL 104 103 103 103  CO2 26 28 26 26   GLUCOSE 101* 97 90 100*  BUN 11 9 16 14   CREATININE 1.02 0.91 1.14 1.06  CALCIUM 8.4* 8.2* 8.5* 8.6*  MG  --   --  2.0 1.8     Recent Results (from the past 240 hour(s))  MRSA Next Gen by PCR, Nasal     Status: None   Collection Time: 11/22/22 12:23 AM   Specimen: Nasal Mucosa; Nasal Swab  Result Value Ref Range Status   MRSA by PCR Next Gen NOT DETECTED NOT DETECTED Final    Comment: (NOTE) The GeneXpert MRSA Assay (FDA approved for NASAL specimens only), is one component of a comprehensive MRSA colonization surveillance program. It is not intended to diagnose MRSA infection nor to guide or monitor treatment for  MRSA infections. Test performance is not FDA approved in patients less than 42 years old. Performed at [facility], [address] [place], [place]      Radiology Studies: MR FOOT RIGHT W WO CONTRAST  Result Date: 11/24/2022 CLINICAL DATA:  Soft tissue infection, osteomyelitis suspected. EXAM: MRI OF THE RIGHT FOREFOOT WITHOUT AND WITH CONTRAST TECHNIQUE: Multiplanar, multisequence MR imaging of the right forefoot was performed before and after the administration of intravenous contrast. CONTRAST:  26mL GADAVIST GADOBUTROL 1 MMOL/ML IV SOLN COMPARISON:  Radiograph performed November 23, 2022 FINDINGS: Bones/Joint/Cartilage Bone marrow edema of the distal aspect of the fifth metatarsal about the prior amputation site with diffuse enhancement on post-contrast sequences. Status post transmetatarsal amputation. Ligaments Lisfranc ligament is intact. Muscles and Tendons No evidence of intramuscular fluid collection or abscess. Tendons appear intact. Soft tissues Skin  irregularity suggesting deep skin wound about the lateral aspect of the fifth digit with mild surrounding edema and inflammatory changes. No fluid collection or abscess. IMPRESSION: 1. Bone marrow edema of the distal aspect of the fifth metatarsal about the prior amputation site with diffuse enhancement on post-contrast sequences concerning for osteomyelitis. 2. Skin irregularity suggesting deep skin wound about the lateral aspect of the fifth digit with mild surrounding edema and inflammatory changes. No fluid collection or abscess. 3. Status post transmetatarsal amputation. Electronically Signed   By: Keane Police D.O.   On: 11/24/2022 22:24   DG Foot Complete Left  Result Date: 11/24/2022 CLINICAL DATA:  Foot pain EXAM: LEFT FOOT - COMPLETE 3+ VIEW COMPARISON:  None Available. FINDINGS: There is no evidence of fracture or dislocation. There is no evidence of arthropathy or other focal bone abnormality. Peripheral  vascular calcifications are present. Soft tissues are otherwise within normal limits. IMPRESSION: 1. No acute fracture or dislocation. 2. Peripheral vascular calcifications. Electronically Signed   By: Ronney Asters M.D.   On: 11/24/2022 17:16   US Abdomen Limited RUQ (LIVER/GB)  Result Date: 11/23/2022 CLINICAL DATA:  Chronic viral hepatitis-B. EXAM: ULTRASOUND ABDOMEN LIMITED RIGHT UPPER QUADRANT COMPARISON:  CT abdomen and pelvis 06/26/2018 FINDINGS: Gallbladder: No gallstones or wall thickening visualized. No sonographic Murphy sign noted by sonographer. Common bile duct: Diameter: 4 mm Liver: No focal lesion identified. Decreased in parenchymal echogenicity. There is nodular liver contour. Portal vein is patent on color Doppler imaging with normal direction of blood flow towards the liver. Other: None. IMPRESSION: 1. Cirrhotic liver morphology. No focal liver lesion identified. 2. No cholelithiasis or sonographic evidence of acute cholecystitis. Electronically Signed   By: Ronney Asters M.D.   On: 11/23/2022 19:56    Scheduled Meds:  atorvastatin  40 mg Oral Daily   bictegravir-emtricitabine-tenofovir AF  1 tablet Oral Daily   enoxaparin (LOVENOX) injection  40 mg Subcutaneous Q24H   feeding supplement  237 mL Oral TID BM   multivitamin with minerals  1 tablet Oral Daily   mouth rinse  15 mL Mouth Rinse 4 times per day   sodium chloride flush  3 mL Intravenous Q12H   Continuous Infusions:   LOS: 3 days   Shelly Coss, MD [facility] P1/06/2023, 11:09 AM
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
## 2022-11-25 NOTE — Progress Notes (Signed)
Mobility Specialist - Progress Note   11/25/22 1443  Mobility  Activity Ambulated with assistance in hallway  Level of Assistance Independent  Assistive Device Other (Comment) (Knee Scooter)  Distance Ambulated (ft) 1050 ft  Range of Motion/Exercises Active  Activity Response Tolerated well  Mobility Referral Yes  $Mobility charge 1 Mobility   Pt was found in bed and agreeable to ambulate. Had no complaints and at EOS returned to bed with all necessities in reach.  Ferd Hibbs Mobility Specialist
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
## 2022-11-26 ENCOUNTER — Encounter (HOSPITAL_COMMUNITY): Payer: Medicaid Other

## 2022-11-26 DIAGNOSIS — M869 Osteomyelitis, unspecified: Secondary | ICD-10-CM | POA: Diagnosis not present

## 2022-11-26 DIAGNOSIS — E440 Moderate protein-calorie malnutrition: Secondary | ICD-10-CM | POA: Insufficient documentation

## 2022-11-26 LAB — CBC
HCT: 41.8 % (ref 39.0–52.0)
Hemoglobin: 13.5 g/dL (ref 13.0–17.0)
MCH: 32.6 pg (ref 26.0–34.0)
MCHC: 32.3 g/dL (ref 30.0–36.0)
MCV: 101 fL — ABNORMAL HIGH (ref 80.0–100.0)
Platelets: 137 10*3/uL — ABNORMAL LOW (ref 150–400)
RBC: 4.14 MIL/uL — ABNORMAL LOW (ref 4.22–5.81)
RDW: 13 % (ref 11.5–15.5)
WBC: 3.8 10*3/uL — ABNORMAL LOW (ref 4.0–10.5)
nRBC: 0 % (ref 0.0–0.2)

## 2022-11-26 LAB — SURGICAL PCR SCREEN
MRSA, PCR: NEGATIVE
Staphylococcus aureus: NEGATIVE

## 2022-11-26 LAB — BASIC METABOLIC PANEL
Anion gap: 3 — ABNORMAL LOW (ref 5–15)
BUN: 21 mg/dL — ABNORMAL HIGH (ref 6–20)
CO2: 29 mmol/L (ref 22–32)
Calcium: 9 mg/dL (ref 8.9–10.3)
Chloride: 103 mmol/L (ref 98–111)
Creatinine, Ser: 1.26 mg/dL — ABNORMAL HIGH (ref 0.61–1.24)
GFR, Estimated: >60 mL/min (ref >60)
Glucose, Bld: 100 mg/dL — ABNORMAL HIGH (ref 70–99)
Potassium: 4.7 mmol/L (ref 3.5–5.1)
Sodium: 135 mmol/L (ref 135–145)

## 2022-11-26 LAB — MISC LABCORP TEST (SEND OUT)
LabCorp test name: 505271
Labcorp test code: 505271

## 2022-11-26 MED ORDER — POVIDONE-IODINE 10 % EX SWAB: 2.0000 | Freq: Once | CUTANEOUS | Status: DC

## 2022-11-26 MED ORDER — CEFAZOLIN SODIUM-DEXTROSE 2-4 GM/100ML-% IV SOLN
2.0000 g | INTRAVENOUS | Status: AC
  Administered 2022-11-27: 2 g via INTRAVENOUS
  Filled 2022-11-26: qty 100

## 2022-11-26 MED ORDER — CHLORHEXIDINE GLUCONATE 4 % EX LIQD
60.0000 mL | Freq: Once | CUTANEOUS | Status: AC
  Administered 2022-11-27: 4 via TOPICAL
  Filled 2022-11-26: qty 60

## 2022-11-26 NOTE — Progress Notes (Addendum)
PROGRESS NOTE  Curtis Hodge  JOA:[id number] DOB: 19-Jan-1966 DOA: 11/21/2022 PCP: Pcp, No   Brief Narrative: Patient is a 57 year old male with history of COPD, HIV, schizophrenia, substance use disorder who presented to the emergency department on 11/21/2022 after he was found to be unresponsive at his friend's house.  Encephalopathic on presentation.  Found that he was using cocaine and subsequently became unresponsive, given Narcan on presentation, remained somnolent on ED.  Mental status significantly improved now, started having right foot pain.  Found to have fifth metatarsal head osteomyelitis.  ID following.  Orthopedics consulted, plan for fifth ray amputation on Wednesday.  Patient will be transferred to [facility].  ID also following.  Assessment & Plan:  Principal Problem:   Osteomyelitis of fifth toe of right foot (HCC) Active Problems:   Acute respiratory failure with hypoxia (HCC)   Toxic metabolic encephalopathy   Aspiration pneumonia (HCC)   Bullous emphysema (HCC)   HIV disease (HCC)   Substance abuse (HCC)   Leukopenia   Acute metabolic encephalopathy   Malnutrition of moderate degree   Acute respiratory failure with hypoxia/toxic metabolic encephalopathy from cocaine ingestion: Found to be unresponsive and brought to the ED.  Encephalopathic on presentation.  Likely secondary to opioid overdose, history of cocaine abuse.  Given multiple rounds of Narcan.  UDS positive for THC and cocaine.  Currently alert and oriented.  On room air  Aspiration pneumonia: Suspected on presentation, was given Unasyn.  Antibiotics has been discontinued.  On room air  Osteomyelitis of right fifth metatarsal head: History of prior transmetatarsal amputation.  Follows with Dr. Lajoyce Corners.  Foot x-ray on 1/6 was concerning for osteomyelitis.  MRI was positive for osteomyelitis showing bone marrow edema of the distal aspect of the fifth metatarsal .  ID was consulted.  Antibiotics on hold.  Dr.  Lajoyce Corners planning for fifth ray amputation on wednesday  History of emphysema/COPD: Currently respiratory status stable, continue bronchodilators as needed  Leukopenia/thrombocytopenia: In the setting of HIV.  Continue to monitor  History of HIV: On Biktarvy, prophylactic Bactrim  History of hyperlipidemia: On Lipitor  Substance abuse: Takes cocaine, THC.  TOC consulted  Mild AKI: Creatinine up to 1.26 today.  Check BMP tomorrow     Nutrition Problem: Moderate Malnutrition Etiology: chronic illness (COPD, HIV, substance use disorder)    DVT prophylaxis:enoxaparin (LOVENOX) injection 40 mg Start: 11/22/22 0015     Code Status: Full Code  Family Communication: None at bedside  Patient status: Inpatient  Patient is from : Home  Anticipated discharge to: Home  Estimated DC date: Not sure   Consultants: ID, Ortho   Procedures: None yet  Antimicrobials:  Anti-infectives (From admission, onward)    Start     Dose/Rate Route Frequency Ordered Stop   11/22/22 1000  bictegravir-emtricitabine-tenofovir AF (BIKTARVY) 50-200-25 MG per tablet 1 tablet        1 tablet Oral Daily 11/22/22 0009     11/22/22 1000  sulfamethoxazole-trimethoprim (BACTRIM DS) 800-160 MG per tablet 1 tablet  Status:  Discontinued        1 tablet Oral Daily 11/22/22 0009 11/24/22 1316   11/22/22 0600  Ampicillin-Sulbactam (UNASYN) 3 g in sodium chloride 0.9 % 100 mL IVPB  Status:  Discontinued        3 g 200 mL/hr over 30 Minutes Intravenous Every 6 hours 11/22/22 0003 11/23/22 1058   11/21/22 2245  Ampicillin-Sulbactam (UNASYN) 3 g in sodium chloride 0.9 % 100 mL IVPB  3 g 200 mL/hr over 30 Minutes Intravenous  Once 11/21/22 2240 11/21/22 2359       Subjective: Patient seen and examined at bedside today.  Hemodynamically stable without any complaints.  Comfortable.  Waiting to be transferred to [facility].  He was asking for his toenails to be cut off during the  hospitalization  Objective: Vitals:   11/25/22 0603 11/25/22 0820 11/25/22 2000 11/26/22 0554  BP: (!) 143/89 116/86 (!) 146/91 (!) 147/75  Pulse: 65 82 86 66  Resp: 20 20 18 20   Temp: 98.2 F (36.8 C) 98.2 F (36.8 C) 98.9 F (37.2 C) 98.7 F (37.1 C)  TempSrc:  Oral Oral Oral  SpO2: 99% 99% 99% 100%  Weight:      Height:        Intake/Output Summary (Last 24 hours) at 11/26/2022 1139 Last data filed at 11/26/2022 1008 Gross per 24 hour  Intake 603 ml  Output --  Net 603 ml   Filed Weights   11/21/22 1754 11/22/22 0130  Weight: 62 kg 67.1 kg    Examination:   General exam: Overall comfortable, not in distress HEENT: PERRL Respiratory system:  no wheezes or crackles  Cardiovascular system: S1 & S2 heard, RRR.  Gastrointestinal system: Abdomen is nondistended, soft and nontender. Central nervous system: Alert and oriented Extremities: No edema, no clubbing ,no cyanosis, right transmetatarsal amputation Skin: No rashes, no ulcers,no icterus     Data Reviewed: I have personally reviewed following labs and imaging studies  CBC: Recent Labs  Lab 11/21/22 1749 11/22/22 0310 11/23/22 0531 11/25/22 0604 11/26/22 0520  WBC 3.1* 3.1* 2.4* 2.9* 3.8*  NEUTROABS 2.1  --   --   --   --   HGB 11.8* 11.3* 12.0* 13.7 13.5  HCT 36.6* 35.2* 37.8* 43.3 41.8  MCV 100.5* 100.6* 101.9* 102.9* 101.0*  PLT 162 143* 116* 127* 0000000*   Basic Metabolic Panel: Recent Labs  Lab 11/21/22 2023 11/22/22 0310 11/23/22 0531 11/25/22 0604 11/26/22 0520  NA 136 136 135 133* 135  K 4.2 3.8 4.0 4.1 4.7  CL 104 103 103 103 103  CO2 26 28 26 26 29   GLUCOSE 101* 97 90 100* 100*  BUN 11 9 16 14  21*  CREATININE 1.02 0.91 1.14 1.06 1.26*  CALCIUM 8.4* 8.2* 8.5* 8.6* 9.0  MG  --   --  2.0 1.8  --      Recent Results (from the past 240 hour(s))  MRSA Next Gen by PCR, Nasal     Status: None   Collection Time: 11/22/22 12:23 AM   Specimen: Nasal Mucosa; Nasal Swab  Result Value Ref  Range Status   MRSA by PCR Next Gen NOT DETECTED NOT DETECTED Final    Comment: (NOTE) The GeneXpert MRSA Assay (FDA approved for NASAL specimens only), is one component of a comprehensive MRSA colonization surveillance program. It is not intended to diagnose MRSA infection nor to guide or monitor treatment for MRSA infections. Test performance is not FDA approved in patients less than 64 years old. Performed at [facility], [place] [address], [place], [place] [address]      Radiology Studies: MR FOOT RIGHT W WO CONTRAST  Result Date: 11/24/2022 CLINICAL DATA:  Soft tissue infection, osteomyelitis suspected. EXAM: MRI OF THE RIGHT FOREFOOT WITHOUT AND WITH CONTRAST TECHNIQUE: Multiplanar, multisequence MR imaging of the right forefoot was performed before and after the administration of intravenous contrast. CONTRAST:  33mL GADAVIST GADOBUTROL 1 MMOL/ML IV  SOLN COMPARISON:  Radiograph performed November 23, 2022 FINDINGS: Bones/Joint/Cartilage Bone marrow edema of the distal aspect of the fifth metatarsal about the prior amputation site with diffuse enhancement on post-contrast sequences. Status post transmetatarsal amputation. Ligaments Lisfranc ligament is intact. Muscles and Tendons No evidence of intramuscular fluid collection or abscess. Tendons appear intact. Soft tissues Skin irregularity suggesting deep skin wound about the lateral aspect of the fifth digit with mild surrounding edema and inflammatory changes. No fluid collection or abscess. IMPRESSION: 1. Bone marrow edema of the distal aspect of the fifth metatarsal about the prior amputation site with diffuse enhancement on post-contrast sequences concerning for osteomyelitis. 2. Skin irregularity suggesting deep skin wound about the lateral aspect of the fifth digit with mild surrounding edema and inflammatory changes. No fluid collection or abscess. 3. Status post transmetatarsal amputation. Electronically Signed   By: Keane Police D.O.   On: 11/24/2022 22:24    Scheduled Meds:  atorvastatin  40 mg Oral Daily   bictegravir-emtricitabine-tenofovir AF  1 tablet Oral Daily   enoxaparin (LOVENOX) injection  40 mg Subcutaneous Q24H   feeding supplement  237 mL Oral TID BM   multivitamin with minerals  1 tablet Oral Daily   sodium chloride flush  3 mL Intravenous Q12H   Continuous Infusions:   LOS: 4 days   Shelly Coss, MD [facility] P1/07/2023, 11:39 AM

## 2022-11-26 NOTE — Progress Notes (Signed)
Mobility Specialist - Progress Note   11/26/22 1339  Mobility  Activity Ambulated with assistance in hallway  Level of Assistance Modified independent, requires aide device or extra time  Assistive Device Other (Comment) (Knee Scooter)  Distance Ambulated (ft) 1050 ft  Range of Motion/Exercises Active  Activity Response Tolerated well  Mobility Referral Yes  $Mobility charge 1 Mobility   Pt was found in bed and agreeable to ambulate. Had no complaints and at EOS returned to bed with all necessities in reach.  Ferd Hibbs Mobility Specialist

## 2022-11-26 NOTE — Plan of Care (Signed)
  Problem: Education: Goal: Knowledge of General Education information will improve Description Including pain rating scale, medication(s)/side effects and non-pharmacologic comfort measures Outcome: Progressing   Problem: Health Behavior/Discharge Planning: Goal: Ability to manage health-related needs will improve Outcome: Progressing   

## 2022-11-27 ENCOUNTER — Encounter (HOSPITAL_COMMUNITY): Payer: Self-pay | Admitting: Internal Medicine

## 2022-11-27 ENCOUNTER — Other Ambulatory Visit (HOSPITAL_COMMUNITY): Payer: Self-pay

## 2022-11-27 ENCOUNTER — Encounter (HOSPITAL_COMMUNITY)
Admission: EM | Disposition: A | Discharge status: Home / Self Care | Payer: Self-pay | Source: Home / Self Care | Attending: Internal Medicine

## 2022-11-27 ENCOUNTER — Other Ambulatory Visit: Payer: Self-pay

## 2022-11-27 ENCOUNTER — Inpatient Hospital Stay (HOSPITAL_COMMUNITY): Payer: Medicaid Other | Admitting: Certified Registered Nurse Anesthetist

## 2022-11-27 DIAGNOSIS — F609 Personality disorder, unspecified: Secondary | ICD-10-CM

## 2022-11-27 DIAGNOSIS — I10 Essential (primary) hypertension: Secondary | ICD-10-CM

## 2022-11-27 DIAGNOSIS — F17210 Nicotine dependence, cigarettes, uncomplicated: Secondary | ICD-10-CM | POA: Diagnosis not present

## 2022-11-27 DIAGNOSIS — F418 Other specified anxiety disorders: Secondary | ICD-10-CM

## 2022-11-27 DIAGNOSIS — B181 Chronic viral hepatitis B without delta-agent: Secondary | ICD-10-CM

## 2022-11-27 DIAGNOSIS — G9341 Metabolic encephalopathy: Secondary | ICD-10-CM | POA: Diagnosis not present

## 2022-11-27 DIAGNOSIS — M869 Osteomyelitis, unspecified: Secondary | ICD-10-CM | POA: Diagnosis not present

## 2022-11-27 DIAGNOSIS — T7611XA Adult physical abuse, suspected, initial encounter: Secondary | ICD-10-CM

## 2022-11-27 DIAGNOSIS — B20 Human immunodeficiency virus [HIV] disease: Secondary | ICD-10-CM | POA: Diagnosis not present

## 2022-11-27 DIAGNOSIS — J439 Emphysema, unspecified: Secondary | ICD-10-CM | POA: Diagnosis not present

## 2022-11-27 DIAGNOSIS — R45850 Homicidal ideations: Secondary | ICD-10-CM | POA: Insufficient documentation

## 2022-11-27 HISTORY — PX: AMPUTATION: SHX166

## 2022-11-27 LAB — BASIC METABOLIC PANEL
Anion gap: 5 (ref 5–15)
BUN: 15 mg/dL (ref 6–20)
CO2: 24 mmol/L (ref 22–32)
Calcium: 8.6 mg/dL — ABNORMAL LOW (ref 8.9–10.3)
Chloride: 105 mmol/L (ref 98–111)
Creatinine, Ser: 0.99 mg/dL (ref 0.61–1.24)
GFR, Estimated: >60 mL/min (ref >60)
Glucose, Bld: 109 mg/dL — ABNORMAL HIGH (ref 70–99)
Potassium: 4.1 mmol/L (ref 3.5–5.1)
Sodium: 134 mmol/L — ABNORMAL LOW (ref 135–145)

## 2022-11-27 LAB — GLUCOSE, CAPILLARY: Glucose-Capillary: 107 mg/dL — ABNORMAL HIGH (ref 70–99)

## 2022-11-27 SURGERY — AMPUTATION BELOW KNEE
Anesthesia: General | Site: Knee | Laterality: Right

## 2022-11-27 MED ORDER — MIDAZOLAM HCL 2 MG/2ML IJ SOLN
INTRAMUSCULAR | Status: AC
  Filled 2022-11-27: qty 2

## 2022-11-27 MED ORDER — FENTANYL CITRATE (PF) 250 MCG/5ML IJ SOLN
INTRAMUSCULAR | Status: AC
  Filled 2022-11-27: qty 5

## 2022-11-27 MED ORDER — ZINC SULFATE 220 (50 ZN) MG PO CAPS
220.0000 mg | ORAL_CAPSULE | Freq: Every day | ORAL | Status: DC
  Administered 2022-11-27 – 2022-12-03 (×7): 220 mg via ORAL
  Filled 2022-11-27 (×7): qty 1

## 2022-11-27 MED ORDER — MAGNESIUM CITRATE PO SOLN: 1.0000 | Freq: Once | ORAL | Status: DC | PRN

## 2022-11-27 MED ORDER — BIKTARVY 50-200-25 MG PO TABS
1.0000 | ORAL_TABLET | Freq: Every day | ORAL | 11 refills | Status: DC
  Filled 2022-11-27: qty 30, 30d supply, fill #0

## 2022-11-27 MED ORDER — BISACODYL 5 MG PO TBEC: 5.0000 mg | DELAYED_RELEASE_TABLET | Freq: Every day | ORAL | Status: DC | PRN

## 2022-11-27 MED ORDER — FENTANYL CITRATE (PF) 100 MCG/2ML IJ SOLN: 100.0000 ug | Freq: Once | INTRAMUSCULAR | Status: AC

## 2022-11-27 MED ORDER — PROPOFOL 10 MG/ML IV BOLUS
INTRAVENOUS | Status: AC
  Filled 2022-11-27: qty 20

## 2022-11-27 MED ORDER — CHLORHEXIDINE GLUCONATE 4 % EX LIQD: 60.0000 mL | Freq: Once | CUTANEOUS | Status: DC

## 2022-11-27 MED ORDER — OXYCODONE HCL 5 MG/5ML PO SOLN: 5.0000 mg | Freq: Once | ORAL | Status: DC | PRN

## 2022-11-27 MED ORDER — CEFAZOLIN SODIUM-DEXTROSE 2-4 GM/100ML-% IV SOLN
2.0000 g | Freq: Three times a day (TID) | INTRAVENOUS | Status: AC
  Administered 2022-11-27 – 2022-11-28 (×2): 2 g via INTRAVENOUS
  Filled 2022-11-27 (×2): qty 100

## 2022-11-27 MED ORDER — LACTATED RINGERS IV SOLN: INTRAVENOUS | Status: DC

## 2022-11-27 MED ORDER — EPHEDRINE SULFATE-NACL 50-0.9 MG/10ML-% IV SOSY
PREFILLED_SYRINGE | INTRAVENOUS | Status: DC | PRN
  Administered 2022-11-27: 5 mg via INTRAVENOUS

## 2022-11-27 MED ORDER — LIDOCAINE 2% (20 MG/ML) 5 ML SYRINGE
INTRAMUSCULAR | Status: DC | PRN
  Administered 2022-11-27: 50 mg via INTRAVENOUS

## 2022-11-27 MED ORDER — 0.9 % SODIUM CHLORIDE (POUR BTL) OPTIME
TOPICAL | Status: DC | PRN
  Administered 2022-11-27: 1000 mL

## 2022-11-27 MED ORDER — BUPIVACAINE HCL (PF) 0.5 % IJ SOLN
INTRAMUSCULAR | Status: DC | PRN
  Administered 2022-11-27: 20 mL via PERINEURAL

## 2022-11-27 MED ORDER — PROPOFOL 10 MG/ML IV BOLUS
INTRAVENOUS | Status: DC | PRN
  Administered 2022-11-27: 160 mg via INTRAVENOUS

## 2022-11-27 MED ORDER — OXYCODONE HCL 5 MG PO TABS
5.0000 mg | ORAL_TABLET | ORAL | Status: DC | PRN
  Administered 2022-12-03: 10 mg via ORAL
  Filled 2022-11-27 (×5): qty 2

## 2022-11-27 MED ORDER — POVIDONE-IODINE 10 % EX SWAB: 2.0000 | Freq: Once | CUTANEOUS | Status: DC

## 2022-11-27 MED ORDER — MAGNESIUM SULFATE 2 GM/50ML IV SOLN: 2.0000 g | Freq: Every day | INTRAVENOUS | Status: DC | PRN

## 2022-11-27 MED ORDER — SODIUM CHLORIDE 0.9 % IV SOLN: INTRAVENOUS | Status: DC

## 2022-11-27 MED ORDER — ONDANSETRON HCL 4 MG/2ML IJ SOLN: 4.0000 mg | Freq: Four times a day (QID) | INTRAMUSCULAR | Status: DC | PRN

## 2022-11-27 MED ORDER — ACETAMINOPHEN 325 MG PO TABS: 325.0000 mg | ORAL_TABLET | ORAL | Status: DC | PRN

## 2022-11-27 MED ORDER — CEFAZOLIN SODIUM-DEXTROSE 2-4 GM/100ML-% IV SOLN: 2.0000 g | INTRAVENOUS | Status: DC

## 2022-11-27 MED ORDER — CHLORHEXIDINE GLUCONATE 0.12 % MT SOLN
15.0000 mL | Freq: Once | OROMUCOSAL | Status: AC
  Administered 2022-11-27: 15 mL via OROMUCOSAL

## 2022-11-27 MED ORDER — PANTOPRAZOLE SODIUM 40 MG PO TBEC
40.0000 mg | DELAYED_RELEASE_TABLET | Freq: Every day | ORAL | Status: DC
  Administered 2022-11-27 – 2022-12-03 (×7): 40 mg via ORAL
  Filled 2022-11-27 (×7): qty 1

## 2022-11-27 MED ORDER — PHENYLEPHRINE 80 MCG/ML (10ML) SYRINGE FOR IV PUSH (FOR BLOOD PRESSURE SUPPORT)
PREFILLED_SYRINGE | INTRAVENOUS | Status: DC | PRN
  Administered 2022-11-27 (×4): 160 ug via INTRAVENOUS

## 2022-11-27 MED ORDER — ACETAMINOPHEN 10 MG/ML IV SOLN: 1000.0000 mg | Freq: Once | INTRAVENOUS | Status: DC | PRN

## 2022-11-27 MED ORDER — POLYETHYLENE GLYCOL 3350 17 G PO PACK: 17.0000 g | PACK | Freq: Every day | ORAL | Status: DC | PRN

## 2022-11-27 MED ORDER — OXYCODONE HCL 5 MG PO TABS
10.0000 mg | ORAL_TABLET | ORAL | Status: DC | PRN
  Administered 2022-11-27 (×2): 10 mg via ORAL
  Administered 2022-11-28 – 2022-12-01 (×14): 15 mg via ORAL
  Administered 2022-12-01: 10 mg via ORAL
  Administered 2022-12-01 – 2022-12-02 (×9): 15 mg via ORAL
  Administered 2022-12-03: 10 mg via ORAL
  Filled 2022-11-27 (×24): qty 3

## 2022-11-27 MED ORDER — ACETAMINOPHEN 160 MG/5ML PO SOLN: 325.0000 mg | ORAL | Status: DC | PRN

## 2022-11-27 MED ORDER — AMISULPRIDE (ANTIEMETIC) 5 MG/2ML IV SOLN: 10.0000 mg | Freq: Once | INTRAVENOUS | Status: DC | PRN

## 2022-11-27 MED ORDER — MIDAZOLAM HCL 2 MG/2ML IJ SOLN: 2.0000 mg | Freq: Once | INTRAMUSCULAR | Status: AC

## 2022-11-27 MED ORDER — BUPIVACAINE LIPOSOME 1.3 % IJ SUSP
INTRAMUSCULAR | Status: DC | PRN
  Administered 2022-11-27: 10 mL via PERINEURAL

## 2022-11-27 MED ORDER — FENTANYL CITRATE (PF) 100 MCG/2ML IJ SOLN
INTRAMUSCULAR | Status: AC
  Administered 2022-11-27: 100 ug via INTRAVENOUS
  Filled 2022-11-27: qty 2

## 2022-11-27 MED ORDER — METOPROLOL TARTRATE 5 MG/5ML IV SOLN: 2.0000 mg | INTRAVENOUS | Status: DC | PRN

## 2022-11-27 MED ORDER — POTASSIUM CHLORIDE CRYS ER 20 MEQ PO TBCR: 20.0000 meq | EXTENDED_RELEASE_TABLET | Freq: Every day | ORAL | Status: DC | PRN

## 2022-11-27 MED ORDER — DOCUSATE SODIUM 100 MG PO CAPS
100.0000 mg | ORAL_CAPSULE | Freq: Every day | ORAL | Status: DC
  Administered 2022-11-28 – 2022-12-03 (×6): 100 mg via ORAL
  Filled 2022-11-27 (×6): qty 1

## 2022-11-27 MED ORDER — GUAIFENESIN-DM 100-10 MG/5ML PO SYRP: 15.0000 mL | ORAL_SOLUTION | ORAL | Status: DC | PRN

## 2022-11-27 MED ORDER — MIDAZOLAM HCL 2 MG/2ML IJ SOLN
INTRAMUSCULAR | Status: AC
  Administered 2022-11-27: 2 mg via INTRAVENOUS
  Filled 2022-11-27: qty 2

## 2022-11-27 MED ORDER — PHENOL 1.4 % MT LIQD: 1.0000 | OROMUCOSAL | Status: DC | PRN

## 2022-11-27 MED ORDER — VITAMIN C 500 MG PO TABS
1000.0000 mg | ORAL_TABLET | Freq: Every day | ORAL | Status: DC
  Administered 2022-11-27 – 2022-12-03 (×7): 1000 mg via ORAL
  Filled 2022-11-27 (×7): qty 2

## 2022-11-27 MED ORDER — DEXAMETHASONE SODIUM PHOSPHATE 10 MG/ML IJ SOLN
INTRAMUSCULAR | Status: DC | PRN
  Administered 2022-11-27: 5 mg via INTRAVENOUS

## 2022-11-27 MED ORDER — HYDROMORPHONE HCL 1 MG/ML IJ SOLN
0.5000 mg | INTRAMUSCULAR | Status: DC | PRN
  Administered 2022-11-28 – 2022-11-29 (×6): 1 mg via INTRAVENOUS
  Filled 2022-11-27 (×6): qty 1

## 2022-11-27 MED ORDER — ROPIVACAINE HCL 5 MG/ML IJ SOLN
INTRAMUSCULAR | Status: DC | PRN
  Administered 2022-11-27: 15 mL via PERINEURAL

## 2022-11-27 MED ORDER — PROMETHAZINE HCL 25 MG/ML IJ SOLN: 6.2500 mg | INTRAMUSCULAR | Status: DC | PRN

## 2022-11-27 MED ORDER — ALUM & MAG HYDROXIDE-SIMETH 200-200-20 MG/5ML PO SUSP
15.0000 mL | ORAL | Status: DC | PRN
  Administered 2022-11-27: 30 mL via ORAL
  Filled 2022-11-27: qty 30

## 2022-11-27 MED ORDER — HYDRALAZINE HCL 20 MG/ML IJ SOLN: 5.0000 mg | INTRAMUSCULAR | Status: DC | PRN

## 2022-11-27 MED ORDER — LABETALOL HCL 5 MG/ML IV SOLN: 10.0000 mg | INTRAVENOUS | Status: DC | PRN

## 2022-11-27 MED ORDER — JUVEN PO PACK
1.0000 | PACK | Freq: Two times a day (BID) | ORAL | Status: DC
  Administered 2022-11-27 – 2022-12-03 (×12): 1 via ORAL
  Filled 2022-11-27 (×12): qty 1

## 2022-11-27 MED ORDER — OXYCODONE HCL 5 MG PO TABS: 5.0000 mg | ORAL_TABLET | Freq: Once | ORAL | Status: DC | PRN

## 2022-11-27 MED ORDER — ACETAMINOPHEN 325 MG PO TABS: 325.0000 mg | ORAL_TABLET | Freq: Four times a day (QID) | ORAL | Status: DC | PRN

## 2022-11-27 MED ORDER — ORAL CARE MOUTH RINSE: 15.0000 mL | Freq: Once | OROMUCOSAL | Status: AC

## 2022-11-27 MED ORDER — FENTANYL CITRATE (PF) 100 MCG/2ML IJ SOLN: 25.0000 ug | INTRAMUSCULAR | Status: DC | PRN

## 2022-11-27 MED ORDER — ONDANSETRON HCL 4 MG/2ML IJ SOLN
INTRAMUSCULAR | Status: DC | PRN
  Administered 2022-11-27: 4 mg via INTRAVENOUS

## 2022-11-27 SURGICAL SUPPLY — 44 items
BAG COUNTER SPONGE SURGICOUNT (BAG) IMPLANT
BAG SPNG CNTER NS LX DISP (BAG)
BIT DRILL 3.2XOCPTL (BIT) ×1 IMPLANT
BIT DRL 3.2XOCPTL (BIT)
BLADE SAW RECIP 87.9 MT (BLADE) ×1 IMPLANT
BLADE SURG 21 STRL SS (BLADE) ×1 IMPLANT
BNDG COHESIVE 6X5 TAN STRL LF (GAUZE/BANDAGES/DRESSINGS) IMPLANT
CANISTER WOUND CARE 500ML ATS (WOUND CARE) ×1 IMPLANT
CANISTER WOUNDNEG PRESSURE 500 (CANNISTER) IMPLANT
COVER SURGICAL LIGHT HANDLE (MISCELLANEOUS) ×1 IMPLANT
CUFF TOURN SGL QUICK 34 (TOURNIQUET CUFF) ×1
CUFF TRNQT CYL 34X4.125X (TOURNIQUET CUFF) ×1 IMPLANT
DRAPE DERMATAC (DRAPES) IMPLANT
DRAPE INCISE IOBAN 66X45 STRL (DRAPES) ×1 IMPLANT
DRAPE U-SHAPE 47X51 STRL (DRAPES) ×1 IMPLANT
DRESSING PREVENA PLUS CUSTOM (GAUZE/BANDAGES/DRESSINGS) ×1 IMPLANT
DRILL BIT (BIT)
DRSG PREVENA PLUS CUSTOM (GAUZE/BANDAGES/DRESSINGS) ×1
DURAPREP 26ML APPLICATOR (WOUND CARE) ×1 IMPLANT
ELECT REM PT RETURN 9FT ADLT (ELECTROSURGICAL) ×1
ELECTRODE REM PT RTRN 9FT ADLT (ELECTROSURGICAL) ×1 IMPLANT
GLOVE BIOGEL PI IND STRL 9 (GLOVE) ×1 IMPLANT
GLOVE SURG ORTHO 9.0 STRL STRW (GLOVE) ×1 IMPLANT
GOWN STRL REUS W/ TWL XL LVL3 (GOWN DISPOSABLE) ×2 IMPLANT
GOWN STRL REUS W/TWL XL LVL3 (GOWN DISPOSABLE) ×2
GRAFT SKIN WND MICRO 38 (Tissue) IMPLANT
GRAFT SKIN WND OMEGA3 SB 7X10 (Tissue) IMPLANT
KIT BASIN OR (CUSTOM PROCEDURE TRAY) ×1 IMPLANT
KIT TURNOVER KIT B (KITS) ×1 IMPLANT
MANIFOLD NEPTUNE II (INSTRUMENTS) ×1 IMPLANT
NS IRRIG 1000ML POUR BTL (IV SOLUTION) ×1 IMPLANT
PACK ORTHO EXTREMITY (CUSTOM PROCEDURE TRAY) ×1 IMPLANT
PAD ARMBOARD 7.5X6 YLW CONV (MISCELLANEOUS) ×1 IMPLANT
PREVENA RESTOR ARTHOFORM 46X30 (CANNISTER) ×1 IMPLANT
SPONGE T-LAP 18X18 ~~LOC~~+RFID (SPONGE) IMPLANT
STAPLER VISISTAT 35W (STAPLE) IMPLANT
STOCKINETTE IMPERVIOUS LG (DRAPES) ×1 IMPLANT
SUT ETHILON 2 0 PSLX (SUTURE) IMPLANT
SUT SILK 2 0 (SUTURE) ×1
SUT SILK 2-0 18XBRD TIE 12 (SUTURE) ×1 IMPLANT
SUT VIC AB 1 CTX 27 (SUTURE) ×2 IMPLANT
TOWEL GREEN STERILE (TOWEL DISPOSABLE) ×1 IMPLANT
TUBE CONNECTING 12X1/4 (SUCTIONS) ×1 IMPLANT
YANKAUER SUCT BULB TIP NO VENT (SUCTIONS) ×1 IMPLANT

## 2022-11-27 NOTE — Anesthesia Procedure Notes (Signed)
Procedure Name: LMA Insertion Date/Time: 11/27/2022 10:58 AM  Performed by: Darletta Moll, CRNAPre-anesthesia Checklist: Patient identified, Emergency Drugs available, Suction available and Patient being monitored Patient Re-evaluated:Patient Re-evaluated prior to induction Oxygen Delivery Method: Circle system utilized Preoxygenation: Pre-oxygenation with 100% oxygen Induction Type: IV induction Ventilation: Mask ventilation without difficulty LMA: LMA inserted LMA Size: 4.0 Number of attempts: 1 Placement Confirmation: positive ETCO2, breath sounds checked- equal and bilateral and CO2 detector Tube secured with: Tape Dental Injury: Teeth and Oropharynx as per pre-operative assessment

## 2022-11-27 NOTE — Progress Notes (Signed)
[facility] for [facility]ious Disease  Date of Admission:  11/21/2022     Total days of antibiotics 2         ASSESSMENT:  Mr. Blandon is having below knee amputation today which will obtain source control. In the absence of bacteremia/systemic infection no additional antibiotics are required post-amputation. Continue with current dose of Biktarvy for HIV and Hepatitis B. Check CD4 count and current Hepatitis B status. Emphasized importance of taking medications daily and stopping/starting medication could result in Hepatitis B flare. [place] with no findings concerning for [place]. Social situation is challenging and will likely complicate assistance that is available for him. Disposition to be determined. Recommend social work evaluation to help navigate available resources if able. [facility] to arrange for Biktarvy to be refilled prior to discharge. Bactrim dependent upon CD4 count. Will arrange follow up in the ID clinic for continuity of care.  PLAN:  Continue current dose of Biktarvy. No additional antibiotics needed for osteomyelitis.  Post surgical wound care per Dr. Sharol Given Obtain CD4 count and Hepatitis B lab work. [facility] pharmacy to bring meds prior to discharge. Recommend social work to Medical illustrator.  Follow up in ID clinic.   Principal Problem:   Osteomyelitis of fifth toe of right foot (HCC) Active Problems:   HIV disease ([place])   Bullous emphysema (HCC)   Substance abuse (HCC)   Leukopenia   Acute respiratory failure with hypoxia (HCC)   Toxic metabolic encephalopathy   Aspiration pneumonia (HCC)   Acute metabolic encephalopathy   Malnutrition of moderate degree    atorvastatin  40 mg Oral Daily   bictegravir-emtricitabine-tenofovir AF  1 tablet Oral Daily   enoxaparin (LOVENOX) injection  40 mg Subcutaneous Q24H   feeding supplement  237 mL Oral TID BM   multivitamin with minerals  1 tablet Oral Daily   povidone-iodine  2 Application Topical Once   sodium  chloride flush  3 mL Intravenous Q12H    SUBJECTIVE:  Afebrile overnight with no acute events. BKA today. Has concerns about housing, his service animal, prosthetic. Previous housing was not safe.   No Known Allergies   Review of Systems: Review of Systems  Constitutional:  Negative for chills, fever and weight loss.  Respiratory:  Negative for cough, shortness of breath and wheezing.   Cardiovascular:  Negative for chest pain and leg swelling.  Gastrointestinal:  Negative for abdominal pain, constipation, diarrhea, nausea and vomiting.  Skin:  Negative for rash.      OBJECTIVE: Vitals:   11/26/22 2023 11/27/22 0307 11/27/22 0342 11/27/22 0842  BP: 126/70 134/77 (!) 141/87 132/71  Pulse: 80 77 87 65  Resp: 20  18 16   Temp: 98.4 F (36.9 C) 99 F (37.2 C) 98.7 F (37.1 C) 98.4 F (36.9 C)  TempSrc: Oral Oral Oral Oral  SpO2: 96% 99% 98% 99%  Weight:      Height:       Body mass index is 21.23 kg/m.  Physical Exam Constitutional:      General: He is not in acute distress.    Appearance: He is well-developed.  Cardiovascular:     Rate and Rhythm: Normal rate and regular rhythm.     Heart sounds: Normal heart sounds.  Pulmonary:     Effort: Pulmonary effort is normal.     Breath sounds: Normal breath sounds.  Skin:    General: Skin is warm and dry.  Neurological:     Mental Status: He is alert  and oriented to person, place, and time.  Psychiatric:        Behavior: Behavior normal.        Thought Content: Thought content normal.        Judgment: Judgment normal.     Lab Results Lab Results  Component Value Date   WBC 3.8 (L) 11/26/2022   HGB 13.5 11/26/2022   HCT 41.8 11/26/2022   MCV 101.0 (H) 11/26/2022   PLT 137 (L) 11/26/2022    Lab Results  Component Value Date   CREATININE 0.99 11/27/2022   BUN 15 11/27/2022   NA 134 (L) 11/27/2022   K 4.1 11/27/2022   CL 105 11/27/2022   CO2 24 11/27/2022    Lab Results  Component Value Date   ALT 67  (H) 11/01/2022   AST 82 (H) 11/01/2022   ALKPHOS 82 11/01/2022   BILITOT 0.4 11/01/2022     Microbiology: Recent Results (from the past 240 hour(s))  MRSA Next Gen by PCR, Nasal     Status: None   Collection Time: 11/22/22 12:23 AM   Specimen: Nasal Mucosa; Nasal Swab  Result Value Ref Range Status   MRSA by PCR Next Gen NOT DETECTED NOT DETECTED Final    Comment: (NOTE) The GeneXpert MRSA Assay (FDA approved for NASAL specimens only), is one component of a comprehensive MRSA colonization surveillance program. It is not intended to diagnose MRSA infection nor to guide or monitor treatment for MRSA infections. Test performance is not FDA approved in patients less than 16 years old. Performed at [facility], [place] [address], [place], [place] [address]   Surgical pcr screen     Status: None   Collection Time: 11/26/22  9:51 PM   Specimen: Nasal Mucosa; Nasal Swab  Result Value Ref Range Status   MRSA, PCR NEGATIVE NEGATIVE Final   Staphylococcus aureus NEGATIVE NEGATIVE Final    Comment: (NOTE) The Xpert SA Assay (FDA approved for NASAL specimens in patients 38 years of age and older), is one component of a comprehensive surveillance program. It is not intended to diagnose infection nor to guide or monitor treatment. Performed at [facility], [place] [address], [place], [place] [address]      Terri Piedra, [facility]  11/27/2022  9:19 AM

## 2022-11-27 NOTE — Plan of Care (Signed)

## 2022-11-27 NOTE — TOC Benefit Eligibility Note (Signed)
Patient Teacher, English as a foreign language completed.    The patient is currently admitted and upon discharge could be taking Biktarvy.  The current 30 day co-pay is $0.00.   The patient is insured through [place], [place] Patient Advocate Specialist [place] Patient Advocate Team Direct Number: [phone]  Fax: [phone]

## 2022-11-27 NOTE — Progress Notes (Signed)
PT Cancellation Note  Patient Details Name: Curtis Hodge MRN: [id number] DOB: 16-Jul-1966   Cancelled Treatment:    Reason Eval/Treat Not Completed: Other (comment)  Pt with R BKA today.  Will f/u tomorrow.  Abran Richard, PT Acute Rehab [facility] [phone]  Karlton Lemon 11/27/2022, 2:23 PM

## 2022-11-27 NOTE — Anesthesia Procedure Notes (Signed)
Anesthesia Regional Block: Popliteal block   Pre-Anesthetic Checklist: , timeout performed,  Correct Patient, Correct Site, Correct Laterality,  Correct Procedure, Correct Position, site marked,  Risks and benefits discussed,  Surgical consent,  Pre-op evaluation,  At surgeon's request and post-op pain management  Laterality: Right  Prep: chloraprep       Needles:  Injection technique: Single-shot  Needle Type: Echogenic Stimulator Needle     Needle Length: 9cm  Needle Gauge: 21     Additional Needles:   Procedures:,,,, ultrasound used (permanent image in chart),,    Narrative:  Start time: 11/27/2022 10:15 AM End time: 11/27/2022 10:20 AM Injection made incrementally with aspirations every 5 mL.  Performed by: Personally  Anesthesiologist: Effie Berkshire, MD  Additional Notes: Discussed risks and benefits of the nerve block in detail, including but not limited vascular injury, permanent nerve damage and infection.   Patient tolerated the procedure well. Local anesthetic introduced in an incremental fashion under minimal resistance after negative aspirations. No paresthesias were elicited. After completion of the procedure, no acute issues were identified and patient continued to be monitored by RN.

## 2022-11-27 NOTE — Progress Notes (Signed)
PROGRESS NOTE  Curtis Hodge  NTI:[id number] DOB: [birth year] DOA: 11/21/2022 PCP: Pcp, No   Brief Narrative: Patient is a 57 year old male with history of COPD, HIV, schizophrenia, substance use disorder who presented to the emergency department on 11/21/2022 after he was found to be unresponsive at his friend's house.  Encephalopathic on presentation.  Found that he was using cocaine and subsequently became unresponsive, given Narcan on presentation, remained somnolent on ED.  Mental status significantly improved and then started having right foot pain.  Found to have fifth metatarsal head osteomyelitis.  ID following.  Orthopedics consulted.  Assessment & Plan:  Toxic metabolic encephalopathy from cocaine ingestion Found to be unresponsive and brought to the ED.  Encephalopathic on presentation.  Likely secondary to opioid overdose, history of cocaine abuse.  Given multiple rounds of Narcan.  UDS positive for THC and cocaine.   Resolved.  Mentation is back to baseline.    Aspiration pneumonia/acute respiratory failure with hypoxia Suspected on presentation, was given Unasyn.  Antibiotics has been discontinued.  Respiratory status is stable.  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  Follows with Dr. Sharol Given.  Foot x-ray on 1/6 was concerning for osteomyelitis.  MRI was positive for osteomyelitis showing bone marrow edema of the distal aspect of the fifth metatarsal .  ID was consulted.  Antibiotics on hold.  Dr. Sharol Given is planning surgical intervention this morning.  Mild AKI Creatinine noted to be up to 1.26 yesterday.  Improved to 0.99 today.  Monitor urine output.  History of emphysema/COPD Stable  Leukopenia/thrombocytopenia In the setting of HIV.  Continue to monitor  History of HIV On Biktarvy. Prophylactic Bactrim to be resumed postoperatively  History of hyperlipidemia On Lipitor  Substance abuse Uses cocaine, THC.  TOC  consulted  Moderate protein calorie malnutrition Nutrition Problem: Moderate Malnutrition Etiology: chronic illness (COPD, HIV, substance use disorder)    DVT prophylaxis:enoxaparin (LOVENOX) injection 40 mg Start: 11/22/22 0015 CODE STATUS: Full code Family Communication: None at bedside Disposition: To be determined  Status is: Inpatient Remains inpatient appropriate because: Osteomyelitis    Consultants: ID, Ortho   Procedures: None yet  Antimicrobials:  Anti-infectives (From admission, onward)    Start     Dose/Rate Route Frequency Ordered Stop   11/27/22 0600  ceFAZolin (ANCEF) IVPB 2g/100 mL premix        2 g 200 mL/hr over 30 Minutes Intravenous On call to O.R. 11/26/22 1943 11/28/22 0559   11/22/22 1000  bictegravir-emtricitabine-tenofovir AF (BIKTARVY) 50-200-25 MG per tablet 1 tablet        1 tablet Oral Daily 11/22/22 0009     11/22/22 1000  sulfamethoxazole-trimethoprim (BACTRIM DS) 800-160 MG per tablet 1 tablet  Status:  Discontinued        1 tablet Oral Daily 11/22/22 0009 11/24/22 1316   11/22/22 0600  Ampicillin-Sulbactam (UNASYN) 3 g in sodium chloride 0.9 % 100 mL IVPB  Status:  Discontinued        3 g 200 mL/hr over 30 Minutes Intravenous Every 6 hours 11/22/22 0003 11/23/22 1058   11/21/22 2245  Ampicillin-Sulbactam (UNASYN) 3 g in sodium chloride 0.9 % 100 mL IVPB        3 g 200 mL/hr over 30 Minutes Intravenous  Once 11/21/22 2240 11/21/22 2359       Subjective: Patient complains of pain in the right foot.  Denies any chest pain shortness of breath.  No nausea or vomiting.  Objective: Vitals:  11/26/22 2023 11/27/22 0307 11/27/22 0342 11/27/22 0842  BP: 126/70 134/77 (!) 141/87 132/71  Pulse: 80 77 87 65  Resp: 20  18 16   Temp: 98.4 F (36.9 C) 99 F (37.2 C) 98.7 F (37.1 C) 98.4 F (36.9 C)  TempSrc: Oral Oral Oral Oral  SpO2: 96% 99% 98% 99%  Weight:      Height:        Intake/Output Summary (Last 24 hours) at 11/27/2022  0855 Last data filed at 11/27/2022 0300 Gross per 24 hour  Intake 243 ml  Output 300 ml  Net -57 ml    Filed Weights   11/21/22 1754 11/22/22 0130  Weight: 62 kg 67.1 kg    Examination:  General appearance: Awake alert.  In no distress Resp: Clear to auscultation bilaterally.  Normal effort Cardio: S1-S2 is normal regular.  No S3-S4.  No rubs murmurs or bruit GI: Abdomen is soft.  Nontender nondistended.  Bowel sounds are present normal.  No masses organomegaly Extremities: Tender in the lateral aspect of the right foot no obvious drainage noted.  He is status post transmetatarsal amputation previously. Neurologic: Alert and oriented x3.  No focal neurological deficits.     Data Reviewed: I have personally reviewed following labs and imaging studies  CBC: Recent Labs  Lab 11/21/22 1749 11/22/22 0310 11/23/22 0531 11/25/22 0604 11/26/22 0520  WBC 3.1* 3.1* 2.4* 2.9* 3.8*  NEUTROABS 2.1  --   --   --   --   HGB 11.8* 11.3* 12.0* 13.7 13.5  HCT 36.6* 35.2* 37.8* 43.3 41.8  MCV 100.5* 100.6* 101.9* 102.9* 101.0*  PLT 162 143* 116* 127* 137*    Basic Metabolic Panel: Recent Labs  Lab 11/22/22 0310 11/23/22 0531 11/25/22 0604 11/26/22 0520 11/27/22 0350  NA 136 135 133* 135 134*  K 3.8 4.0 4.1 4.7 4.1  CL 103 103 103 103 105  CO2 28 26 26 29 24   GLUCOSE 97 90 100* 100* 109*  BUN 9 16 14  21* 15  CREATININE 0.91 1.14 1.06 1.26* 0.99  CALCIUM 8.2* 8.5* 8.6* 9.0 8.6*  MG  --  2.0 1.8  --   --       Recent Results (from the past 240 hour(s))  MRSA Next Gen by PCR, Nasal     Status: None   Collection Time: 11/22/22 12:23 AM   Specimen: Nasal Mucosa; Nasal Swab  Result Value Ref Range Status   MRSA by PCR Next Gen NOT DETECTED NOT DETECTED Final    Comment: (NOTE) The GeneXpert MRSA Assay (FDA approved for NASAL specimens only), is one component of a comprehensive MRSA colonization surveillance program. It is not intended to diagnose MRSA infection nor to  guide or monitor treatment for MRSA infections. Test performance is not FDA approved in patients less than 70 years old. Performed at [facility], [place] [address] [place],  [address]   Surgical pcr screen     Status: None   Collection Time: 11/26/22  9:51 PM   Specimen: Nasal Mucosa; Nasal Swab  Result Value Ref Range Status   MRSA, PCR NEGATIVE NEGATIVE Final   Staphylococcus aureus NEGATIVE NEGATIVE Final    Comment: (NOTE) The Xpert SA Assay (FDA approved for NASAL specimens in patients 62 years of age and older), is one component of a comprehensive surveillance program. It is not intended to diagnose infection nor to guide or monitor treatment. Performed at [facility], [place] [address] [place],  [place] [address]      Radiology Studies: No results found.  Scheduled Meds:  atorvastatin  40 mg Oral Daily   bictegravir-emtricitabine-tenofovir AF  1 tablet Oral Daily   enoxaparin (LOVENOX) injection  40 mg Subcutaneous Q24H   feeding supplement  237 mL Oral TID BM   multivitamin with minerals  1 tablet Oral Daily   povidone-iodine  2 Application Topical Once   sodium chloride flush  3 mL Intravenous Q12H   Continuous Infusions:   ceFAZolin (ANCEF) IV       LOS: 5 days   Osvaldo Shipper, MD [facility] P1/08/2023, 8:55 AM

## 2022-11-27 NOTE — Interval H&P Note (Signed)
History and Physical Interval Note:  11/27/2022 6:43 AM  Curtis Hodge  has presented today for surgery, with the diagnosis of osteomyelitis 5th metatarsal right foot.  The various methods of treatment have been discussed with the patient and family. After consideration of risks, benefits and other options for treatment, the patient has consented to  Procedure(s): RIGHT 5TH RAY AMPUTATION (Right) as a surgical intervention.  The patient's history has been reviewed, patient examined, no change in status, stable for surgery.  I have reviewed the patient's chart and labs.  Questions were answered to the patient's satisfaction.     Newt Minion

## 2022-11-27 NOTE — TOC Initial Note (Signed)
Transition of Care [facility]) - Initial/Assessment Note    Patient Details  Name: Curtis Hodge MRN: [id number] Date of Birth: 08-10-1966  Transition of Care [facility]) CM/SW Contact:    Curtis Mons, RN Phone Number: 11/27/2022, 3:27 PM  Clinical Narrative:                     - S/P  RIGHT AMPUTATION BELOW KNEE  Readmitted with acute hypoxic respiratory failure and toxic metabolic encephalopathy NCM spoke with pt regarding d/c planning. Pt states resides with friend, Curtis Hodge. States he doesn't  want to return to friend's residence once d/c. States conditions aren't good. States his  service animal  and important papers are there. States he has to figure out how to get belongings. NCM encourage him to solicit family help.  Pt states PTA independent with ADL's.  States outpatient PT/OT referral made with previous hospitalization however pt  readmitted to hospital before services initiated.   PT/OT evaluations pending...  TOC team will continue to monitor and assist with needs.  Expected Discharge Plan: [place] (vs SNF vs CIR;  resides with friend Curtis Hodge) Barriers to Discharge: Continued Medical Work up   Patient Goals and CMS Choice Patient states their goals for this hospitalization and ongoing recovery are:: Go home with family   Choice offered to / list presented to : Patient      Expected Discharge Plan and Services In-house Referral: NA Discharge Planning Services: CM Consult   Living arrangements for the past 2 months: Single Family Home                 DME Arranged: N/A DME Agency: NA       HH Arranged: NA HH Agency: NA        Prior Living Arrangements/Services Living arrangements for the past 2 months: Single Family Home Lives with:: Siblings Patient language and need for interpreter reviewed:: Yes Do you feel safe going back to the place where you live?: Yes      Need for Family Participation in Patient Care: Yes (Comment) Care  giver support system in place?: Yes (comment) Current home services: Other (comment) Criminal Activity/Legal Involvement Pertinent to Current Situation/Hospitalization: No - Comment as needed  Activities of Daily Living Home Assistive Devices/Equipment: Cane (specify quad or straight) ADL Screening (condition at time of admission) Patient's cognitive ability adequate to safely complete daily activities?: No Is the patient deaf or have difficulty hearing?: No Does the patient have difficulty seeing, even when wearing glasses/contacts?: No Does the patient have difficulty concentrating, remembering, or making decisions?: Yes Patient able to express need for assistance with ADLs?: Yes Does the patient have difficulty dressing or bathing?: Yes Independently performs ADLs?: No Communication: Independent Is this a change from baseline?: Change from baseline, expected to last <3 days Does the patient have difficulty walking or climbing stairs?: Yes Weakness of Legs: Both Weakness of Arms/Hands: Both  Permission Sought/Granted Permission sought to share information with : Case Manager Permission granted to share information with : Yes, Release of Information Signed  Share Information with NAME: Case Manager  Permission granted to share info w AGENCY: OP PT/OT at [facility]        Emotional Assessment Appearance:: Appears stated age Attitude/Demeanor/Rapport: Engaged, Gracious Affect (typically observed): Accepting Orientation: : Oriented to Self, Oriented to Place, Oriented to  Time Alcohol / Substance Use: Illicit Drugs (positive cocaine, THC on admission) Psych Involvement: No (comment)  Admission diagnosis:  Acute respiratory failure with hypoxia (HCC) [J96.01] Opiate overdose, accidental or unintentional, initial encounter ([place]) [V40.086P] Acute metabolic encephalopathy [Y19.50] Patient Active Problem List   Diagnosis Date Noted   Malnutrition of moderate degree 93/26/7124   Acute  metabolic encephalopathy 58/07/9832   Acute respiratory failure with hypoxia ([place]) 82/50/5397   Toxic metabolic encephalopathy 67/34/1937   Aspiration pneumonia ([place]) 11/05/2022   AMS (altered mental status) 11/02/2022   Essential hypertension 05/29/2022   Hyperlipidemia 05/29/2022   Schizophrenia ([place]) 05/29/2022   PTSD (post-traumatic stress disorder) 05/29/2022   Methamphetamine abuse ([place]) 05/29/2022   Cocaine abuse ([place]) 05/29/2022   Syncope and collapse 01/15/2022   Leukopenia 01/15/2022   History of transmetatarsal amputation of right foot ([place]) 11/25/2018   Snoring 08/20/2018   Substance abuse ([place]) 07/22/2018   Osteomyelitis of fifth toe of right foot ([place]) 07/21/2018   Homelessness 07/14/2018   Healthcare maintenance 07/14/2018   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   MDD (major depressive disorder) 10/25/2017   Chest pain 08/07/2016   HIV disease ([place]) 02/09/2015   Bullous emphysema ([place]) 02/09/2015   Sciatic pain 08/30/2013   Incarceration    Chronic hepatitis B without delta agent without hepatic coma ([place]) 07/11/2010   CANNABIS ABUSE, EPISODIC 07/11/2010   SMOKER 07/11/2010   PCP:  Pcp, No Pharmacy:   [facility] [address], [place] [address] Phone: [phone] Fax: [phone]  [place], [address] [place] [address] Phone: [phone] Fax: [phone]  [facility] [place], [address] [place] [address] Phone: [phone] Fax: [phone]  Curtis [facility] [address] [place] [address] Phone: [phone] Fax: [phone]     Social Determinants of Health (SDOH) Social History: SDOH Screenings   Food Insecurity: Unknown (11/22/2022)  Transportation Needs: Unknown  (11/22/2022)  Utilities: Unknown (11/22/2022)  Alcohol Screen: Low Risk  (10/25/2017)  Depression (PHQ2-9): High Risk (02/07/2022)  Tobacco Use: High Risk (11/27/2022)   SDOH Interventions:     Readmission Risk Interventions     No data to display

## 2022-11-27 NOTE — Anesthesia Preprocedure Evaluation (Signed)
Anesthesia Evaluation  Patient identified by MRN, date of birth, ID band Patient awake    Reviewed: Allergy & Precautions, NPO status , Patient's Chart, lab work & pertinent test results, reviewed documented beta blocker date and time   Airway Mallampati: I  TM Distance: >3 FB Neck ROM: Full    Dental  (+) Poor Dentition   Pulmonary COPD, Current Smoker and Patient abstained from smoking.   breath sounds clear to auscultation       Cardiovascular hypertension, Pt. on home beta blockers  Rhythm:Regular Rate:Normal     Neuro/Psych  PSYCHIATRIC DISORDERS Anxiety Depression  Schizophrenia   Neuromuscular disease    GI/Hepatic negative GI ROS,,,(+) Hepatitis -  Endo/Other  negative endocrine ROS    Renal/GU Renal disease     Musculoskeletal  (+) Arthritis ,    Abdominal   Peds  Hematology  (+) HIV  Anesthesia Other Findings   Reproductive/Obstetrics                             Anesthesia Physical Anesthesia Plan  ASA: 3  Anesthesia Plan: General   Post-op Pain Management: Regional block*   Induction: Intravenous  PONV Risk Score and Plan: 2 and Ondansetron, Dexamethasone and Midazolam  Airway Management Planned: LMA  Additional Equipment: None  Intra-op Plan:   Post-operative Plan: Extubation in OR  Informed Consent: I have reviewed the patients History and Physical, chart, labs and discussed the procedure including the risks, benefits and alternatives for the proposed anesthesia with the patient or authorized representative who has indicated his/her understanding and acceptance.     Dental advisory given  Plan Discussed with: CRNA  Anesthesia Plan Comments:        Anesthesia Quick Evaluation

## 2022-11-27 NOTE — Plan of Care (Signed)

## 2022-11-27 NOTE — Op Note (Signed)
11/27/2022  11:28 AM  PATIENT:  Curtis Hodge    PRE-OPERATIVE DIAGNOSIS:  osteomyelitis 5th metatarsal right foot  POST-OPERATIVE DIAGNOSIS:  Same  PROCEDURE:  RIGHT AMPUTATION BELOW KNEE Application of Kerecis micro graft 38 cm and Kerecis sheet 7 x 10 cm. Application of Prevena customizable and Prevena arthroform wound VAC dressings Application of Vive Wear stump shrinker and the [employer] limb protector  SURGEON:  Newt Minion, MD  ANESTHESIA:   General  PREOPERATIVE INDICATIONS:  Curtis Hodge is a  57 y.o. male with a diagnosis of osteomyelitis 5th metatarsal right foot who failed conservative measures and elected for surgical management.    The risks benefits and alternatives were discussed with the patient preoperatively including but not limited to the risks of infection, bleeding, nerve injury, cardiopulmonary complications, the need for revision surgery, among others, and the patient was willing to proceed.  OPERATIVE IMPLANTS: Kerecis micro graft 38 cm and Kerecis sheet 7 x 10 cm.   OPERATIVE FINDINGS: osteomyelitis right foot  OPERATIVE PROCEDURE: Patient was brought to the operating room after undergoing a regional anesthetic.  After adequate levels anesthesia were obtained a thigh tourniquet was placed and the lower extremity was prepped using DuraPrep draped into a sterile field. The foot was draped out of the sterile field with impervious stockinette.  A timeout was called and the tourniquet inflated.  A transverse skin incision was made 12 cm distal to the tibial tubercle, the incision curved proximally, and a large posterior flap was created.  The tibia was transected just proximal to the skin incision and beveled anteriorly.  The fibula was transected just proximal to the tibial incision.  The sciatic nerve was pulled cut and allowed to retract.  The vascular bundles were suture ligated with 2-0 silk.  The tourniquet was deflated and hemostasis obtained.     Drill holes were placed through the tibia and fibula to secure the [facility] tissue graft and the gastrocnemius fascia.    The Kerecis micro powder 38 cm was applied to the open wound that has a 200 cm surface area.  The 7 x 10 cm Kerecis sheet was then folded and secured to the distal tibia and fibula with #1 Vicryl.  A separate drill hole was then used to secure the [facility] tissue graft and the gastrocnemius fascia to the dorsum of the tibia.    The deep and superficial fascial layers were closed using #1 Vicryl.  The skin was closed using staples.    The Prevena customizable dressing was applied this was overwrapped with the arthroform sponge.  Charlie Pitter was used to secure the sponges and the circumferential compression was secured to the skin with Dermatac.  This was connected to the wound VAC pump and had a good suction fit this was covered with a stump shrinker and a limb protector.  Patient was taken to the PACU in stable condition.   DISCHARGE PLANNING:  Antibiotic duration: 24-hour antibiotics  Weightbearing: Nonweightbearing on the operative extremity  Pain medication: Opioid pathway  Dressing care/ Wound VAC: Continue wound VAC with the Prevena plus pump at discharge for 1 week  Ambulatory devices: Walker or kneeling scooter  Discharge to: Discharge planning based on recommendations per physical therapy  Follow-up: In the office 1 week after discharge.

## 2022-11-27 NOTE — Transfer of Care (Signed)
Immediate Anesthesia Transfer of Care Note  Patient: Curtis Hodge  Procedure(s) Performed: RIGHT AMPUTATION BELOW KNEE (Right: Knee)  Patient Location: PACU  Anesthesia Type:General and Regional  Level of Consciousness: drowsy and patient cooperative  Airway & Oxygen Therapy: Patient Spontanous Breathing  Post-op Assessment: Report given to RN, Post -op Vital signs reviewed and stable, and Patient moving all extremities X 4  Post vital signs: Reviewed and stable  Last Vitals:  Vitals Value Taken Time  BP 115/56   Temp    Pulse 76 11/27/22 1128  Resp 11   SpO2 99 % 11/27/22 1128  Vitals shown include unvalidated device data.  Last Pain:  Vitals:   11/27/22 1030  TempSrc:   PainSc: 0-No pain      Patients Stated Pain Goal: 0 (65/99/35 7017)  Complications: No notable events documented.

## 2022-11-27 NOTE — Anesthesia Procedure Notes (Signed)
Anesthesia Regional Block: Adductor canal block   Pre-Anesthetic Checklist: , timeout performed,  Correct Patient, Correct Site, Correct Laterality,  Correct Procedure, Correct Position, site marked,  Risks and benefits discussed,  Surgical consent,  Pre-op evaluation,  At surgeon's request and post-op pain management  Laterality: Right  Prep: chloraprep       Needles:  Injection technique: Single-shot  Needle Type: Echogenic Stimulator Needle     Needle Length: 9cm  Needle Gauge: 21     Additional Needles:   Procedures:,,,, ultrasound used (permanent image in chart),,    Narrative:  Start time: 11/27/2022 10:15 AM End time: 11/27/2022 10:20 AM Injection made incrementally with aspirations every 5 mL.  Performed by: Personally  Anesthesiologist: Effie Berkshire, MD  Additional Notes: Discussed risks and benefits of the nerve block in detail, including but not limited vascular injury, permanent nerve damage and infection.   Patient tolerated the procedure well. Local anesthetic introduced in an incremental fashion under minimal resistance after negative aspirations. No paresthesias were elicited. After completion of the procedure, no acute issues were identified and patient continued to be monitored by RN.

## 2022-11-27 NOTE — Progress Notes (Signed)
Patient ID: Curtis Hodge, male   DOB: 05/10/1966, 57 y.o.   MRN: [id number] Reviewed patient's finding of the fifth ray infection and his transmetatarsal amputation.  Patient states that he feels unstable on his transmetatarsal amputation and feels like additional surgery would make him more unstable.  Patient states he would like to proceed with a below-knee amputation.  Will plan for a below-knee amputation today.

## 2022-11-27 NOTE — Anesthesia Postprocedure Evaluation (Signed)
Anesthesia Post Note  Patient: Tobin Witucki  Procedure(s) Performed: RIGHT AMPUTATION BELOW KNEE (Right: Knee)     Patient location during evaluation: PACU Anesthesia Type: General Level of consciousness: awake and alert Pain management: pain level controlled Vital Signs Assessment: post-procedure vital signs reviewed and stable Respiratory status: spontaneous breathing, nonlabored ventilation, respiratory function stable and patient connected to nasal cannula oxygen Cardiovascular status: blood pressure returned to baseline and stable Postop Assessment: no apparent nausea or vomiting Anesthetic complications: no  No notable events documented.  Last Vitals:  Vitals:   11/27/22 1200 11/27/22 1217  BP: 122/70 130/79  Pulse: 70 68  Resp: 12 15  Temp: 36.8 C 36.7 C  SpO2: 96% 98%    Last Pain:  Vitals:   11/27/22 1217  TempSrc: Oral  PainSc: 0-No pain                 Effie Berkshire

## 2022-11-27 NOTE — Progress Notes (Signed)
Orthopedic Tech Progress Note Patient Details:  Curtis Hodge [birth year] [id number]  Called in order to [employer] for a [place] SHR[employer]ER  Patient ID: Curtis Hodge, male   DOB: May 12, 1966, 57 y.o.   MRN: [id number]  [name] 11/27/2022, 10:54 AM

## 2022-11-28 ENCOUNTER — Encounter (HOSPITAL_COMMUNITY): Payer: Self-pay | Admitting: Orthopedic Surgery

## 2022-11-28 ENCOUNTER — Inpatient Hospital Stay (HOSPITAL_COMMUNITY): Payer: Medicaid Other

## 2022-11-28 DIAGNOSIS — M869 Osteomyelitis, unspecified: Secondary | ICD-10-CM | POA: Diagnosis not present

## 2022-11-28 DIAGNOSIS — B20 Human immunodeficiency virus [HIV] disease: Secondary | ICD-10-CM | POA: Diagnosis not present

## 2022-11-28 DIAGNOSIS — R4182 Altered mental status, unspecified: Secondary | ICD-10-CM

## 2022-11-28 DIAGNOSIS — R404 Transient alteration of awareness: Secondary | ICD-10-CM | POA: Diagnosis not present

## 2022-11-28 LAB — CBC
HCT: 36.3 % — ABNORMAL LOW (ref 39.0–52.0)
Hemoglobin: 12.2 g/dL — ABNORMAL LOW (ref 13.0–17.0)
MCH: 32.6 pg (ref 26.0–34.0)
MCHC: 33.6 g/dL (ref 30.0–36.0)
MCV: 97.1 fL (ref 80.0–100.0)
Platelets: 126 10*3/uL — ABNORMAL LOW (ref 150–400)
RBC: 3.74 MIL/uL — ABNORMAL LOW (ref 4.22–5.81)
RDW: 12.7 % (ref 11.5–15.5)
WBC: 9.4 10*3/uL (ref 4.0–10.5)
nRBC: 0 % (ref 0.0–0.2)

## 2022-11-28 LAB — BASIC METABOLIC PANEL
Anion gap: 8 (ref 5–15)
BUN: 12 mg/dL (ref 6–20)
CO2: 27 mmol/L (ref 22–32)
Calcium: 8.7 mg/dL — ABNORMAL LOW (ref 8.9–10.3)
Chloride: 100 mmol/L (ref 98–111)
Creatinine, Ser: 0.84 mg/dL (ref 0.61–1.24)
GFR, Estimated: >60 mL/min (ref >60)
Glucose, Bld: 123 mg/dL — ABNORMAL HIGH (ref 70–99)
Potassium: 4.2 mmol/L (ref 3.5–5.1)
Sodium: 135 mmol/L (ref 135–145)

## 2022-11-28 LAB — T-HELPER CELLS (CD4) COUNT (NOT AT ARMC)
CD4 % Helper T Cell: 15 % — ABNORMAL LOW (ref 33–65)
CD4 T Cell Abs: 202 /uL — ABNORMAL LOW (ref 400–1790)

## 2022-11-28 LAB — MAGNESIUM: Magnesium: 1.8 mg/dL (ref 1.7–2.4)

## 2022-11-28 LAB — PHOSPHORUS: Phosphorus: 4.4 mg/dL (ref 2.5–4.6)

## 2022-11-28 MED ORDER — SULFAMETHOXAZOLE-TRIMETHOPRIM 800-160 MG PO TABS
1.0000 | ORAL_TABLET | Freq: Every day | ORAL | Status: DC
  Administered 2022-11-29 – 2022-12-03 (×5): 1 via ORAL
  Filled 2022-11-28 (×5): qty 1

## 2022-11-28 MED ORDER — SENNOSIDES-DOCUSATE SODIUM 8.6-50 MG PO TABS
2.0000 | ORAL_TABLET | Freq: Two times a day (BID) | ORAL | Status: DC
  Administered 2022-11-28 – 2022-12-02 (×7): 2 via ORAL
  Filled 2022-11-28 (×10): qty 2

## 2022-11-28 NOTE — Evaluation (Signed)
Occupational Therapy Evaluation Patient Details Name: Curtis Hodge MRN: [id number] DOB: February 08, 1966 Today's Date: 11/28/2022   History of Present Illness Patient is a 57 year old male with history of COPD, HIV, schizophrenia, substance use disorder who presented to the emergency department on 11/21/2022 after he was found to be unresponsive at his friend's house.  Encephalopathic on presentation.  Found that he was using cocaine and subsequently became unresponsive, given Narcan on presentation, remained somnolent on ED.  Mental status significantly improved and then started having right foot pain.  Found to have fifth metatarsal head osteomyelitis. Underwent Right BKA on 11/27/21.   Clinical Impression   Pt in bed upon therapy arrival and agreeable to participate in OT evaluation. Prior to admit, pt was living with a friend  and was independent with all ADL tasks and functional transfers. Pt reports that he is not wanting to return to prior living situation as it is not safe. Pt states he has a service dog for his COPD. Currently, pt is presenting near baseline for BADL tasks with SBA-Mod I. He is able to complete bathing/dressing tasks while either seated EOB or at bed level. Reviewed post op R BKA precautions with pt verbalizing understanding also additional teaching may be needed due to attention deficits. Once seated in recliner pt had several episodes of  "seizure-like" activity, stating the same word over and over, shaking head, and not responding to verbal stimuli. RN called and and witnessed episode as well. When episodes stopped, pt was unaware that it had happened and was of baseline mentation. Recommend SNF at discharge to further increase pt's independence with BADL tasks and functional transfers. No further acute OT needs at this time. Pt able to receive additional OT services at next venue of care.      Recommendations for follow up therapy are one component of a multi-disciplinary  discharge planning process, led by the attending physician.  Recommendations may be updated based on patient status, additional functional criteria and insurance authorization.   Follow Up Recommendations  Skilled nursing-short term rehab (<3 hours/day)     Assistance Recommended at Discharge PRN  Patient can return home with the following Help with stairs or ramp for entrance;Assist for transportation;A little help with walking and/or transfers    Functional Status Assessment  Patient has had a recent decline in their functional status and demonstrates the ability to make significant improvements in function in a reasonable and predictable amount of time.  Equipment Recommendations  Other (comment) (defer to next venue of care)       Precautions / Restrictions Precautions Precautions: Fall Precaution Comments: R BKA with wound vac; Hx of HIV, Hep B Required Braces or Orthoses: Other Brace Other Brace: RLE limb protector Restrictions Weight Bearing Restrictions: Yes RLE Weight Bearing: Non weight bearing      Mobility Bed Mobility Overal bed mobility: Modified Independent Bed Mobility: Supine to Sit     Supine to sit: Modified independent (Device/Increase time), HOB elevated          Transfers Overall transfer level: Needs assistance Equipment used: Rolling walker (2 wheels) Transfers: Sit to/from Stand, Bed to chair/wheelchair/BSC Sit to Stand: Min guard, From elevated surface Stand pivot transfers: Min guard                Balance Overall balance assessment: Needs assistance Sitting-balance support: No upper extremity supported, Feet supported Sitting balance-Leahy Scale: Good Sitting balance - Comments: sitting EOB   Standing balance support: Bilateral upper extremity supported, During  functional activity, Reliant on assistive device for balance Standing balance-Leahy Scale: Fair Standing balance comment: Standing with RW.         ADL either  performed or assessed with clinical judgement   ADL Overall ADL's : Needs assistance/impaired     Toilet Transfer: Min guard;BSC/3in1;Stand-pivot;Rolling walker (2 wheels);Cueing for safety;Cueing for sequencing   Toileting- Clothing Manipulation and Hygiene: Min guard;Sit to/from stand;Sitting/lateral lean         General ADL Comments: When performing basic ADL tasks such as bathing/dressing at bed level and/or seated on EOB Pt will require only set up of supplies.     Vision Baseline Vision/History: 0 No visual deficits Ability to See in Adequate Light: 0 Adequate Patient Visual Report: No change from baseline Vision Assessment?: No apparent visual deficits Additional Comments: No reports of visual deficits. No visual deficits noted during evaluation.            Pertinent Vitals/Pain Pain Assessment Pain Assessment: Faces Faces Pain Scale: Hurts even more Pain Location: Right residual limb with movement Pain Descriptors / Indicators: Grimacing, Pressure Pain Intervention(s): Monitored during session, Repositioned, Ice applied     Hand Dominance Right   Extremity/Trunk Assessment Upper Extremity Assessment Upper Extremity Assessment: Overall WFL for tasks assessed RUE Deficits / Details: History of 5th digit amputation. Pt demonstrated BUE strength required to complete bed mobility, sit to stand transitions, and functional mobility while using RW.   Lower Extremity Assessment Lower Extremity Assessment: Defer to PT evaluation   Cervical / Trunk Assessment Cervical / Trunk Assessment: Normal   Communication Communication Communication: No difficulties   Cognition Arousal/Alertness: Awake/alert Behavior During Therapy: WFL for tasks assessed/performed Overall Cognitive Status: Within Functional Limits for tasks assessed       General Comments: Pt has a history of Schizophrenia and PTSD.     General Comments  VSS            Home Living Family/patient  expects to be discharged to:: Skilled nursing facility Living Arrangements: Other (Comment) (Pt was living with friend) Available Help at Discharge:  (Unknown at this time. Pt reports that he will not be returning to previous housing arrangement) Type of Home: House       Home Equipment: None   Additional Comments: Pt reports having a Service dog for COPD      Prior Functioning/Environment Prior Level of Function : Independent/Modified Independent            OT Problem List: Impaired balance (sitting and/or standing)      OT Treatment/Interventions: Self-care/ADL training    OT Goals(Current goals can be found in the care plan section) Acute Rehab OT Goals Patient Stated Goal: none stated  OT Frequency:         AM-PAC OT "6 Clicks" Daily Activity     Outcome Measure Help from another person eating meals?: None Help from another person taking care of personal grooming?: None Help from another person toileting, which includes using toliet, bedpan, or urinal?: None Help from another person bathing (including washing, rinsing, drying)?: A Little Help from another person to put on and taking off regular upper body clothing?: None Help from another person to put on and taking off regular lower body clothing?: None 6 Click Score: 23   End of Session Equipment Utilized During Treatment: Gait belt;Rolling walker (2 wheels) Nurse Communication: Other (comment);Mobility status (Occurance of seizure-like episode.)  Activity Tolerance: Patient tolerated treatment well Patient left: in chair;with call bell/phone within reach;with chair alarm  set;with nursing/sitter in room  OT Visit Diagnosis: Unsteadiness on feet (R26.81);Muscle weakness (generalized) (M62.81)                Time: 6160-7371 OT Time Calculation (min): 28 min Charges:  OT General Charges $OT Visit: 1 Visit OT Evaluation $OT Eval Moderate Complexity: 1 Mod  [employer], OTR/L,CBIS  Supplemental OT - [facility]  and [facility] Secure Chat Preferred    Zakkery Dorian, Charisse March 11/28/2022, 11:19 AM

## 2022-11-28 NOTE — Progress Notes (Signed)
Physical Therapy Treatment Patient Details Name: Curtis Hodge MRN: [id number] DOB: 1966-04-06 Today's Date: 11/28/2022   History of Present Illness 57 yo male presents to [facility] on 11/23/22 with respiratory failure likely due to unintentional opioid overdose. Pt also with R foot pain and finally underwent R BKA at [facility] on 1/10.  PMH includes: HIV noncompliant with medications, chronic hepatitis B, COPD/emphysema, anxiety/depression, R transmet amputation, schizophrenia, homelessness.    PT Comments    Pt received standing up EOB unassisted, pt with decreased apparent safety awareness and attempting to manage wound vac, RW and urinal without staff assist. PTA entered room for pt safety and pt receptive to instruction on fall risk prevention, safe use of RW and navigating at bedside with wound vac lines, pt needing mostly min guard for safety. Pt up in recliner with chair alarm on and distal RLE elevated with ice applied for pain/edema mgmt at end of session, RN notified pt was up without bed alarm sounding, per RN he may have turned it off himself. Pt re-instructed on importance of call bell use and was able to demo back. Pt becoming more lethargic at end of session, closing eyes but responding to direct verbal questions. Pt continues to benefit from PT services to progress toward functional mobility goals.   Recommendations for follow up therapy are one component of a multi-disciplinary discharge planning process, led by the attending physician.  Recommendations may be updated based on patient status, additional functional criteria and insurance authorization.  Follow Up Recommendations  Skilled nursing-short term rehab (<3 hours/day) Can patient physically be transported by private vehicle: Yes   Assistance Recommended at Discharge Frequent or constant Supervision/Assistance  Patient can return home with the following Help with stairs or ramp for entrance;A little help with walking and/or  transfers;A little help with bathing/dressing/bathroom;Assistance with cooking/housework   Equipment Recommendations  Rolling walker (2 wheels)    Recommendations for Other Services       Precautions / Restrictions Precautions Precautions: Fall Precaution Comments: R BKA with wound vac; Hx of HIV, Hep B Required Braces or Orthoses: Other Brace Other Brace: RLE limb protector Restrictions Weight Bearing Restrictions: Yes RLE Weight Bearing: Non weight bearing     Mobility  Bed Mobility               General bed mobility comments: pt already EOB and impulsively standing prior to PTA entering room    Transfers Overall transfer level: Needs assistance Equipment used: Rolling walker (2 wheels) Transfers: Sit to/from Stand, Bed to chair/wheelchair/BSC Sit to Stand: Supervision, Min guard   Step pivot transfers: Min guard       General transfer comment: Pt already standing unassisted as PTA entering the room so pt given Supervision for safety. vc's for hand placement, safety, how to alert staff with call bell, also reminder he needs assist due to wound vac line and with recent history of neuro consult/"buffering", he is a high fall risk. May need reinforcement. Per RN, she thinks he turned bed alarm off on his own.    Ambulation/Gait Ambulation/Gait assistance: Min guard Gait Distance (Feet): 5 Feet Assistive device: Rolling walker (2 wheels) (knee scooter) Gait Pattern/deviations:  (hop to) Gait velocity: reduced     General Gait Details: Pt was able to safely hop with RW. Discussed the importance of using RW and not hopping without UE support, esp given lines and recent neuro episode earlier in the day. Distance limited due to pt fatigue/pain and requesting to sit in  chair to eat dinner.   Stairs             Wheelchair Mobility    Modified Rankin (Stroke Patients Only)       Balance Overall balance assessment: Needs assistance Sitting-balance  support: No upper extremity supported, Feet supported Sitting balance-Leahy Scale: Good Sitting balance - Comments: sitting EOB   Standing balance support: Bilateral upper extremity supported, During functional activity, Reliant on assistive device for balance Standing balance-Leahy Scale: Fair Standing balance comment: able to stand with only one hand on RW and no LOB using urinal, min guard for safety when hopping provided given pt decreased line awareness                            Cognition Arousal/Alertness: Awake/alert Behavior During Therapy: Impulsive, Restless Overall Cognitive Status: No family/caregiver present to determine baseline cognitive functioning Area of Impairment: Attention                   Current Attention Level: Selective Memory: Decreased recall of precautions Following Commands: Follows multi-step commands inconsistently, Follows one step commands consistently Safety/Judgement: Decreased awareness of safety, Decreased awareness of deficits   Problem Solving: Requires verbal cues General Comments: Pt has a history of Schizophrenia and PTSD. Is very impulsive and needs short, concise vc's. Pt received standing up from bedside, no alarm on but pt with wound vac and urinal in his hand, standing at RW. PTA entering room to assist him due to high fall risk, RN also notified pt got up unassisted. Pt up in chair and instructed on call bell use, alarm on for pt safety at end of session        Exercises Amputee Exercises Chair Push Up: AROM, Both, 5 reps, Seated (while repositioning)    General Comments General comments (skin integrity, edema, etc.): VSS; RN notified of pt impulsivity. Pt agreeable to RLE elevation/ice in chair, chair alarm on for pt safety; geomat placed in chair under pt hips for comfort as pt reports he may get uncomfortable and try to get himself back; reinforced call bell use      Pertinent Vitals/Pain Pain Assessment Pain  Assessment: Faces Faces Pain Scale: Hurts even more Pain Location: Right residual limb with movement Pain Descriptors / Indicators: Grimacing, Pressure Pain Intervention(s): Monitored during session, Repositioned, Limited activity within patient's tolerance, Ice applied           PT Goals (current goals can now be found in the care plan section) Acute Rehab PT Goals Patient Stated Goal: find a new place to live PT Goal Formulation: With patient Time For Goal Achievement: 12/12/22 Progress towards PT goals: Progressing toward goals    Frequency    Min 3X/week      PT Plan Current plan remains appropriate       AM-PAC PT "6 Clicks" Mobility   Outcome Measure  Help needed turning from your back to your side while in a flat bed without using bedrails?: None Help needed moving from lying on your back to sitting on the side of a flat bed without using bedrails?: None Help needed moving to and from a bed to a chair (including a wheelchair)?: A Little Help needed standing up from a chair using your arms (e.g., wheelchair or bedside chair)?: A Little Help needed to walk in hospital room?: A Little Help needed climbing 3-5 steps with a railing? : Total 6 Click Score: 18  End of Session Equipment Utilized During Treatment:  (RLE limb guard) Activity Tolerance: Patient tolerated treatment well;Patient limited by pain Patient left: with call bell/phone within reach;in chair;with chair alarm set (RLE elevated, ice pack over limb) Nurse Communication: Mobility status;Precautions;Other (comment) (pt getting up unassisted, he may have turned off his bed alarm; chair alarm on in chair) PT Visit Diagnosis: Other abnormalities of gait and mobility (R26.89);Difficulty in walking, not elsewhere classified (R26.2);Pain Pain - Right/Left: Right Pain - part of body: Leg     Time: 0865-7846 PT Time Calculation (min) (ACUTE ONLY): 17 min  Charges:  $Therapeutic Activity: 8-22 mins                      Bertine Schlottman P., PTA Acute Rehabilitation Services Secure Chat Preferred 9a-5:30pm Office: [phone]    [facility] 11/28/2022, 6:06 PM

## 2022-11-28 NOTE — Progress Notes (Signed)
Patient ID: Curtis Hodge, male   DOB: 1966/10/11, 57 y.o.   MRN: [id number] Patient is postoperative day 1 right below-knee amputation.  There is no drainage in the wound VAC canister there is a good suction fit.  Patient expressed interest in discharge to skilled nursing.

## 2022-11-28 NOTE — Progress Notes (Signed)
STAT EEG complete - results pending. ? ?

## 2022-11-28 NOTE — Progress Notes (Signed)
Inpatient Rehab Admissions Coordinator:   Note therapy recommending SNF for rehab.  Will sign off for CIR at this time.   Shann Medal, PT, DPT Admissions Coordinator [phone] 11/28/22  12:29 PM

## 2022-11-28 NOTE — Evaluation (Signed)
Physical Therapy Evaluation Patient Details Name: Maliek Schellhorn MRN: [id number] DOB: 06/04/1966 Today's Date: 11/28/2022  History of Present Illness  57 yo male presents to [facility] on 11/23/22 with respiratory failure likely due to unintentional opioid overdose. Pt also with R foot pain and finally underwent R BKA at [facility] on 1/10.  PMH includes: HIV noncompliant with medications, chronic hepatitis B, COPD/emphysema, anxiety/depression, R transmet amputation, schizophrenia, homelessness.  Clinical Impression  Pt admitted with above diagnosis. Pt received in bed. Education given on proper positioning and there ex for R BKA, handout left with pt. Pt quite impulsive and with attention deficits so will need repeat teaching. Pt was able to hop 5' with RW and min A. Did very well with mobility. VSS throughout. However, once in chair pt had several episodes of stating same word over and over and shaking head and not responding to therapist. RN called and present and witnessed as well. When episodes stopped pt was unaware that it had happened and was of baseline mentation. Pt does not appear to have come from a safe environment, recommend SNF for rehab. Pt has a service dog for his COPD that he is hoping can go with him.   Pt currently with functional limitations due to the deficits listed below (see PT Problem List). Pt will benefit from skilled PT to increase their independence and safety with mobility to allow discharge to the venue listed below.          Recommendations for follow up therapy are one component of a multi-disciplinary discharge planning process, led by the attending physician.  Recommendations may be updated based on patient status, additional functional criteria and insurance authorization.  Follow Up Recommendations Skilled nursing-short term rehab (<3 hours/day) Can patient physically be transported by private vehicle: Yes    Assistance Recommended at Discharge Frequent or constant  Supervision/Assistance  Patient can return home with the following  Help with stairs or ramp for entrance;A little help with walking and/or transfers;A little help with bathing/dressing/bathroom;Assistance with cooking/housework    Equipment Recommendations Rolling walker (2 wheels)  Recommendations for Other Services       Functional Status Assessment Patient has had a recent decline in their functional status and demonstrates the ability to make significant improvements in function in a reasonable and predictable amount of time.     Precautions / Restrictions Precautions Precautions: Fall Precaution Comments: R BKA with wound vac; Hx of HIV, Hep B Required Braces or Orthoses: Other Brace Other Brace: RLE limb protector Restrictions Weight Bearing Restrictions: Yes RLE Weight Bearing: Non weight bearing      Mobility  Bed Mobility Overal bed mobility: Modified Independent Bed Mobility: Supine to Sit     Supine to sit: Modified independent (Device/Increase time), HOB elevated     General bed mobility comments: pt able to sit straight up in bed    Transfers Overall transfer level: Needs assistance Equipment used: Rolling walker (2 wheels) Transfers: Sit to/from Stand, Bed to chair/wheelchair/BSC Sit to Stand: Min assist Stand pivot transfers: Min guard         General transfer comment: vc's for hand placement.    Ambulation/Gait Ambulation/Gait assistance: Min assist Gait Distance (Feet): 5 Feet Assistive device: Rolling walker (2 wheels) (knee scooter) Gait Pattern/deviations: Staggering right (hop to) Gait velocity: reduced Gait velocity interpretation: <1.8 ft/sec, indicate of risk for recurrent falls   General Gait Details: pt used knee scooter PTA. Was able to safely hop with RW. Discussed the importance of using  RW and not hopping without UE support  Stairs            Wheelchair Mobility    Modified Rankin (Stroke Patients Only)        Balance Overall balance assessment: Needs assistance Sitting-balance support: No upper extremity supported, Feet supported Sitting balance-Leahy Scale: Good Sitting balance - Comments: sitting EOB   Standing balance support: Bilateral upper extremity supported, During functional activity, Reliant on assistive device for balance Standing balance-Leahy Scale: Fair Standing balance comment: able to stand with only one hand on RW and no LOB                             Pertinent Vitals/Pain Pain Assessment Pain Assessment: Faces Faces Pain Scale: Hurts even more Pain Location: Right residual limb with movement Pain Descriptors / Indicators: Grimacing, Pressure Pain Intervention(s): Limited activity within patient's tolerance, Monitored during session, Premedicated before session    Home Living Family/patient expects to be discharged to:: Skilled nursing facility Living Arrangements: Other (Comment) (Pt was living with friend) Available Help at Discharge:  (Unknown at this time. Pt reports that he will not be returning to previous housing arrangement) Type of Home: House Home Access: Stairs to enter Entrance Stairs-Rails: Right Entrance Stairs-Number of Steps: 5   Home Layout: One level Home Equipment: None Additional Comments: Pt reports having a Service dog for COPD    Prior Function Prior Level of Function : Independent/Modified Independent                     Hand Dominance   Dominant Hand: Right    Extremity/Trunk Assessment   Upper Extremity Assessment Upper Extremity Assessment: Defer to OT evaluation RUE Deficits / Details: History of 5th digit amputation. Pt demonstrated BUE strength required to complete bed mobility, sit to stand transitions, and functional mobility while using RW.    Lower Extremity Assessment Lower Extremity Assessment: RLE deficits/detail RLE Deficits / Details: R hip strength and ROM WFL, discussed proper positioning of  RLE to prepare for prosthesis. Pt able to achieve full R knee extension and 90 deg R knee flex. RLE Sensation: decreased proprioception;decreased light touch RLE Coordination: decreased gross motor    Cervical / Trunk Assessment Cervical / Trunk Assessment: Normal  Communication   Communication: No difficulties  Cognition Arousal/Alertness: Awake/alert Behavior During Therapy: Impulsive Overall Cognitive Status: No family/caregiver present to determine baseline cognitive functioning Area of Impairment: Attention                   Current Attention Level: Selective   Following Commands: Follows multi-step commands inconsistently Safety/Judgement: Decreased awareness of safety, Decreased awareness of deficits     General Comments: Pt has a history of Schizophrenia and PTSD. Is very impulsive and needs short, concise vc's.        General Comments General comments (skin integrity, edema, etc.): VSS. End of session once in chair pt was talking and then started saying the same word over and over and not respoding to therapist. RN called into room and witnessed the same thing happen multiple times. Pt would "snap out of it" with passive, vigorous mvmt of LLE. Pt unaware of episodes and does not know if they happened PTA.    Exercises Amputee Exercises Quad Sets: AROM, Right, 10 reps Hip Extension: AROM, Right, 10 reps, Standing Hip Flexion/Marching: AROM, Right, 10 reps, Standing Knee Flexion: AROM, 20 reps, Right Knee Extension: AROM, 20  reps, Right Straight Leg Raises: AROM, Right, 10 reps, Supine   Assessment/Plan    PT Assessment Patient needs continued PT services  PT Problem List Decreased mobility;Pain;Decreased knowledge of precautions;Decreased knowledge of use of DME;Decreased cognition;Decreased coordination;Decreased balance       PT Treatment Interventions DME instruction;Therapeutic activities;Gait training;Therapeutic exercise;Patient/family  education;Balance training;Stair training;Functional mobility training;Neuromuscular re-education    PT Goals (Current goals can be found in the Care Plan section)  Acute Rehab PT Goals Patient Stated Goal: find a new place to live PT Goal Formulation: With patient Time For Goal Achievement: 12/12/22 Potential to Achieve Goals: Good    Frequency Min 3X/week     Co-evaluation PT/OT/SLP Co-Evaluation/Treatment: Yes Reason for Co-Treatment: Necessary to address cognition/behavior during functional activity;For patient/therapist safety PT goals addressed during session: Mobility/safety with mobility;Balance;Proper use of DME;Strengthening/ROM         AM-PAC PT "6 Clicks" Mobility  Outcome Measure Help needed turning from your back to your side while in a flat bed without using bedrails?: None Help needed moving from lying on your back to sitting on the side of a flat bed without using bedrails?: None Help needed moving to and from a bed to a chair (including a wheelchair)?: A Little Help needed standing up from a chair using your arms (e.g., wheelchair or bedside chair)?: A Little Help needed to walk in hospital room?: A Little Help needed climbing 3-5 steps with a railing? : Total 6 Click Score: 18    End of Session Equipment Utilized During Treatment: Gait belt Activity Tolerance: Patient tolerated treatment well Patient left: with call bell/phone within reach;in chair;with chair alarm set;with nursing/sitter in room Nurse Communication: Mobility status PT Visit Diagnosis: Other abnormalities of gait and mobility (R26.89);Difficulty in walking, not elsewhere classified (R26.2);Pain Pain - Right/Left: Right Pain - part of body: Leg    Time: 0923-0952 PT Time Calculation (min) (ACUTE ONLY): 29 min   Charges:   PT Evaluation $PT Eval Moderate Complexity: [place] chat preferred Office [place] 11/28/2022, 11:58 AM

## 2022-11-28 NOTE — Progress Notes (Signed)
PT reported pt OOB unassisted pt was educated on calling for assistance and not to turn off bed alarms when they are set

## 2022-11-28 NOTE — Progress Notes (Signed)
Nutrition Follow-up  DOCUMENTATION CODES:   Non-severe (moderate) malnutrition in context of chronic illness  INTERVENTION:  Continue Ensure Plus High Protein po BID, each supplement provides 350 kcal and 20 grams of protein Continue Juven BID to support wound healing Continue MVI with minerals daily  NUTRITION DIAGNOSIS:   Moderate Malnutrition related to chronic illness (COPD, HIV, substance use disorder) as evidenced by moderate muscle depletion, moderate fat depletion.  Ongoing  GOAL:   Patient will meet greater than or equal to 90% of their needs  progressing  MONITOR:   PO intake, Supplement acceptance, Weight trends  REASON FOR ASSESSMENT:   Malnutrition Screening Tool    ASSESSMENT:   57 y.o. male with medical history significant for COPD, HIV, schizophrenia, substance use disorder who was admitted with acute hypoxic respiratory failure and toxic metabolic encephalopathy suspected due to unintentional opioid overdose with aspiration pneumonia  01/10: s/p R BKA + wound VAC  S/p stat EEG today- findings WNL  Attempted to speak with pt at bedside. He was attempting to adjust his R leg brace. He was non-responsive and didn't notice RD despite name calling and standing beside him. He would intermittently respond then stare off at the wall. In that time, he mentioned enjoying the nutrition supplements and understands the importance of consuming them but unable to provide other detailed nutrition related information at this time. Discussed concerns of pt's staring off to RN. At that time RN mentions she was aware and were pending CT scan to assess.  Meal completions: 01/05: 100% breakfast, 75% lunch 01/06: 100% breakfast 01/07: 100% breakfast 01/08: 25% breakfast  Medications: vitamin C 1000mg  daily, biktarvy, colace, MVI, protonix, senna, zinc daily (01/10-01/24)  Labs: reviewed  Diet Order:   Diet Order             Diet regular Room service appropriate? Yes;  Fluid consistency: Thin  Diet effective now                   EDUCATION NEEDS:   Education needs have been addressed  Skin:  Skin Assessment: Skin Integrity Issues: Skin Integrity Issues:: Incisions Incisions: R leg (closed incision)  Last BM:  1/9  Height:   Ht Readings from Last 1 Encounters:  11/22/22 5\' 10"  (1.778 m)    Weight:   Wt Readings from Last 1 Encounters:  11/22/22 67.1 kg    Ideal Body Weight:  71.8 kg (adj for R BKA)  BMI:  Body mass index is 22.71 kg/m. (Adj for amp)  Estimated Nutritional Needs:   Kcal:  2000-2150 kcals  Protein:  90-110 grams  Fluid:  >/= 2L  Clayborne Dana, RDN, LDN Clinical Nutrition

## 2022-11-28 NOTE — Progress Notes (Addendum)
PROGRESS NOTE  Curtis Hodge  EHM:[id number] DOB: 1966/10/16 DOA: 11/21/2022 PCP: Pcp, No   Brief Narrative: Patient is a 57 year old male with history of COPD, HIV, schizophrenia, substance use disorder who presented to the emergency department on 11/21/2022 after he was found to be unresponsive at his friend's house.  Encephalopathic on presentation.  Found that he was using cocaine and subsequently became unresponsive, given Narcan on presentation, remained somnolent on ED.  Mental status significantly improved and then started having right foot pain.  Found to have fifth metatarsal head osteomyelitis.  ID following.  Orthopedics consulted.  Assessment & Plan:  ADDENDUM Called by RN that the patient was behaving strangely.  He was repeating his words and then would stare off without speaking for several seconds.  Patient seen at bedside.  He had a similar episode which I witnessed.  He would not respond to me while he was in that episode.  Subsequently he would follow my commands.  No facial asymmetry noted.  No pronator drift.  Motor strength equal.  Tongue is midline.  Following this he again had a similar episode.  He was also noted to be rhythmically tapping with his right hand on the armrest of the chair.  Vital signs are noted to be stable.  No known history of seizure disorder.  CT of the head done recently in December did not show any acute findings.  I have asked the RN to put him back in the bed.  Discussed with neurology who will consult.  CT head ordered.  He did receive oxycodone within the last 45 to 50 minutes of this episode.  Blood work from this morning was unremarkable.  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  Follows with Dr. Sharol Given.  Foot x-ray on 1/6 was concerning for osteomyelitis.  MRI was positive for osteomyelitis showing bone marrow edema of the distal aspect of the fifth metatarsal .  ID was consulted.  Antibiotics on  hold.   Patient was seen by Dr. Sharol Given.  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 PT and OT evaluation pending.  May need short-term rehab. Pain is poorly controlled.  Will adjust his pain medications. Per ID he no longer requires antibiotics.  Toxic metabolic encephalopathy from cocaine ingestion Found to be unresponsive and brought to the ED.  Encephalopathic on presentation.  Likely secondary to opioid overdose, history of cocaine abuse.  Given multiple rounds of Narcan.  UDS positive for THC and cocaine.   Resolved.  Mentation is back to baseline.    Aspiration pneumonia/acute respiratory failure with hypoxia Suspected on presentation, was given Unasyn.  Antibiotics has been discontinued.  Respiratory status is stable.  Mild AKI Creatinine noted to be up to 1.26 on 1/9.  Has improved.  Monitor urine output.  History of emphysema/COPD Stable  Leukopenia/thrombocytopenia In the setting of HIV.  Continue to monitor. Counts are stable.  History of HIV On Biktarvy. Prophylactic Bactrim to be resumed postoperatively.  Will resume from tomorrow.  Normocytic anemia Mild drop in hemoglobin is noted.  Could be dilutional versus surgical loss.  Will recheck tomorrow.  Anemia panel.  History of hyperlipidemia On Lipitor  Substance abuse Uses cocaine, THC.  TOC consulted  Social situation Patient mentions that he does not have a place to live.  He was living with a friend but is apparently a safe environment with drug use etc.  He fears for his safety especially in the setting of his recent amputation.  Social worker is following. He denies any suicidal ideation.  No homicidal ideation either.  He is just worried about one particular individual who apparently is a Armed forces technical officer.  Moderate protein calorie malnutrition Nutrition Problem: Moderate Malnutrition Etiology: chronic illness (COPD, HIV, substance use disorder)    DVT prophylaxis: Lovenox CODE STATUS: Full code Family  Communication: None at bedside Disposition: To be determined  Status is: Inpatient Remains inpatient appropriate because: Osteomyelitis    Consultants: ID, Ortho   Procedures: Right below-knee amputation on 1/10  Antimicrobials:  Anti-infectives (From admission, onward)    Start     Dose/Rate Route Frequency Ordered Stop   11/27/22 1900  ceFAZolin (ANCEF) IVPB 2g/100 mL premix        2 g 200 mL/hr over 30 Minutes Intravenous Every 8 hours 11/27/22 1213 11/28/22 0212   11/27/22 0945  ceFAZolin (ANCEF) IVPB 2g/100 mL premix  Status:  Discontinued        2 g 200 mL/hr over 30 Minutes Intravenous On call to O.R. 11/27/22 0936 11/27/22 0940   11/27/22 0600  ceFAZolin (ANCEF) IVPB 2g/100 mL premix        2 g 200 mL/hr over 30 Minutes Intravenous On call to O.R. 11/26/22 1943 11/27/22 1120   11/27/22 0000  bictegravir-emtricitabine-tenofovir AF (BIKTARVY) 50-200-25 MG TABS tablet        1 tablet Oral Daily 11/27/22 0955     11/22/22 1000  bictegravir-emtricitabine-tenofovir AF (BIKTARVY) 50-200-25 MG per tablet 1 tablet        1 tablet Oral Daily 11/22/22 0009     11/22/22 1000  sulfamethoxazole-trimethoprim (BACTRIM DS) 800-160 MG per tablet 1 tablet  Status:  Discontinued        1 tablet Oral Daily 11/22/22 0009 11/24/22 1316   11/22/22 0600  Ampicillin-Sulbactam (UNASYN) 3 g in sodium chloride 0.9 % 100 mL IVPB  Status:  Discontinued        3 g 200 mL/hr over 30 Minutes Intravenous Every 6 hours 11/22/22 0003 11/23/22 1058   11/21/22 2245  Ampicillin-Sulbactam (UNASYN) 3 g in sodium chloride 0.9 % 100 mL IVPB        3 g 200 mL/hr over 30 Minutes Intravenous  Once 11/21/22 2240 11/21/22 2359       Subjective: Complains of 7 out of 10 pain in the right lower extremity.  Denies any nausea or vomiting.  Concerned about his living situation.  Objective: Vitals:   11/27/22 1629 11/27/22 1921 11/28/22 0023 11/28/22 0356  BP: (!) 141/88 (!) 144/78 (!) 143/81 (!) 154/82  Pulse: 87  88 78 70  Resp: 17 17 19 19   Temp: 97.8 F (36.6 C) 98.1 F (36.7 C) 98.1 F (36.7 C) 97.9 F (36.6 C)  TempSrc: Oral Oral Oral Oral  SpO2: 96% 96% 95% 100%  Weight:      Height:        Intake/Output Summary (Last 24 hours) at 11/28/2022 0853 Last data filed at 11/28/2022 0500 Gross per 24 hour  Intake 803 ml  Output 1150 ml  Net -347 ml    Filed Weights   11/21/22 1754 11/22/22 0130  Weight: 62 kg 67.1 kg    Examination:  General appearance: Awake alert.  In no distress Resp: Clear to auscultation bilaterally.  Normal effort Cardio: S1-S2 is normal regular.  No S3-S4.  No rubs murmurs or bruit GI: Abdomen is soft.  Nontender nondistended.  Bowel sounds are present normal.  No masses organomegaly Extremities: Right lower extremity stump  covered with dressing Neurologic: Alert and oriented x3.  No focal neurological deficits.      Data Reviewed: I have personally reviewed following labs and imaging studies  CBC: Recent Labs  Lab 11/21/22 1749 11/22/22 0310 11/23/22 0531 11/25/22 0604 11/26/22 0520 11/28/22 0348  WBC 3.1* 3.1* 2.4* 2.9* 3.8* 9.4  NEUTROABS 2.1  --   --   --   --   --   HGB 11.8* 11.3* 12.0* 13.7 13.5 12.2*  HCT 36.6* 35.2* 37.8* 43.3 41.8 36.3*  MCV 100.5* 100.6* 101.9* 102.9* 101.0* 97.1  PLT 162 143* 116* 127* 137* 126*    Basic Metabolic Panel: Recent Labs  Lab 11/23/22 0531 11/25/22 0604 11/26/22 0520 11/27/22 0350 11/28/22 0348  NA 135 133* 135 134* 135  K 4.0 4.1 4.7 4.1 4.2  CL 103 103 103 105 100  CO2 26 26 29 24 27   GLUCOSE 90 100* 100* 109* 123*  BUN 16 14 21* 15 12  CREATININE 1.14 1.06 1.26* 0.99 0.84  CALCIUM 8.5* 8.6* 9.0 8.6* 8.7*  MG 2.0 1.8  --   --  1.8  PHOS  --   --   --   --  4.4      Recent Results (from the past 240 hour(s))  MRSA Next Gen by PCR, Nasal     Status: None   Collection Time: 11/22/22 12:23 AM   Specimen: Nasal Mucosa; Nasal Swab  Result Value Ref Range Status   MRSA by PCR Next Gen  NOT DETECTED NOT DETECTED Final    Comment: (NOTE) The GeneXpert MRSA Assay (FDA approved for NASAL specimens only), is one component of a comprehensive MRSA colonization surveillance program. It is not intended to diagnose MRSA infection nor to guide or monitor treatment for MRSA infections. Test performance is not FDA approved in patients less than 45 years old. Performed at [facility], [address], [place], [place]   Surgical pcr screen     Status: None   Collection Time: 11/26/22  9:51 PM   Specimen: Nasal Mucosa; Nasal Swab  Result Value Ref Range Status   MRSA, PCR NEGATIVE NEGATIVE Final   Staphylococcus aureus NEGATIVE NEGATIVE Final    Comment: (NOTE) The Xpert SA Assay (FDA approved for NASAL specimens in patients 33 years of age and older), is one component of a comprehensive surveillance program. It is not intended to diagnose infection nor to guide or monitor treatment. Performed at [facility], [address] [place], [place]      Radiology Studies: No results found.  Scheduled Meds:  vitamin C  1,000 mg Oral Daily   atorvastatin  40 mg Oral Daily   bictegravir-emtricitabine-tenofovir AF  1 tablet Oral Daily   docusate sodium  100 mg Oral Daily   enoxaparin (LOVENOX) injection  40 mg Subcutaneous Q24H   feeding supplement  237 mL Oral TID BM   multivitamin with minerals  1 tablet Oral Daily   nutrition supplement (JUVEN)  1 packet Oral BID BM   pantoprazole  40 mg Oral Daily   sodium chloride flush  3 mL Intravenous Q12H   zinc sulfate  220 mg Oral Daily   Continuous Infusions:  sodium chloride 75 mL/hr at 11/27/22 1720   magnesium sulfate bolus IVPB       LOS: 6 days   01/26/23, MD [facility] P1/09/2023, 8:53 AM

## 2022-11-28 NOTE — Procedures (Signed)
Patient Name: Curtis Hodge  MRN: [id number]  Epilepsy Attending: Lora Havens  Referring Physician/Provider: August Albino, NP  Date: 11/28/2022 Duration: 2 hour 58 mins  Patient history: 57 year old male with altered mental status.  EEG to evaluate for seizure.  Level of alertness: Awake, asleep  AEDs during EEG study: None  Technical aspects: This EEG study was done with scalp electrodes positioned according to the 10-20 International system of electrode placement. Electrical activity was reviewed with band pass filter of 1-70Hz , sensitivity of 7 uV/mm, display speed of 46mm/sec with a 60Hz  notched filter applied as appropriate. EEG data were recorded continuously and digitally stored.  Video monitoring was available and reviewed as appropriate.  Description: The posterior dominant rhythm consists of 9-10 Hz activity of moderate voltage (25-35 uV) seen predominantly in posterior head regions, symmetric and reactive to eye opening and eye closing. Sleep was characterized by vertex waves, sleep spindles (12 to 14 Hz), maximal frontocentral region.    Patient was noted to be staring at 1149. Concomitant EEG showed normal posterior dominant rhythm.  Hyperventilation and photic stimulation were not performed.     IMPRESSION: This study is within normal limits. No seizures or epileptiform discharges were seen throughout the recording.  Patient was noted to be staring at 1149 without concomitant EEG change.  This was not an epileptic event.  Curtis Hodge

## 2022-11-28 NOTE — Progress Notes (Signed)
pt had several episodes of stating same word over and over and shaking head and not responding to therapist or Probation officer. When episodes stopped pt was unaware that it had happened and was at his baseline mentation level. MD notified and came to evaluate pt. Neurology consulted CT head and EEG was ordered with results pending

## 2022-11-28 NOTE — Progress Notes (Signed)
Mobility Specialist Progress Note   11/28/22 1700  Mobility  Activity Dangled on edge of bed  Level of Assistance Standby assist, set-up cues, supervision of patient - no hands on  Assistive Device None  RLE Weight Bearing NWB  Activity Response Tolerated well  Mobility Referral Yes  $Mobility charge 1 Mobility   Received pt in bed having no complaints and agreeable to eat dinner at EOB. Stand by A to get supine>EOB w/ min cues on weight shifting to accommodate R limb. No faults on transition, left w/ call bell in reach and needs met.   Holland Falling Mobility Specialist Please contact via SecureChat or  Rehab office at [phone]

## 2022-11-28 NOTE — Plan of Care (Signed)
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
## 2022-11-29 ENCOUNTER — Other Ambulatory Visit (HOSPITAL_COMMUNITY): Payer: Self-pay

## 2022-11-29 DIAGNOSIS — M869 Osteomyelitis, unspecified: Secondary | ICD-10-CM | POA: Diagnosis not present

## 2022-11-29 LAB — BASIC METABOLIC PANEL
Anion gap: 9 (ref 5–15)
BUN: 12 mg/dL (ref 6–20)
CO2: 26 mmol/L (ref 22–32)
Calcium: 8.6 mg/dL — ABNORMAL LOW (ref 8.9–10.3)
Chloride: 98 mmol/L (ref 98–111)
Creatinine, Ser: 1.05 mg/dL (ref 0.61–1.24)
GFR, Estimated: >60 mL/min (ref >60)
Glucose, Bld: 128 mg/dL — ABNORMAL HIGH (ref 70–99)
Potassium: 3.7 mmol/L (ref 3.5–5.1)
Sodium: 133 mmol/L — ABNORMAL LOW (ref 135–145)

## 2022-11-29 LAB — CBC
HCT: 36.8 % — ABNORMAL LOW (ref 39.0–52.0)
Hemoglobin: 12.5 g/dL — ABNORMAL LOW (ref 13.0–17.0)
MCH: 32.7 pg (ref 26.0–34.0)
MCHC: 34 g/dL (ref 30.0–36.0)
MCV: 96.3 fL (ref 80.0–100.0)
Platelets: 122 10*3/uL — ABNORMAL LOW (ref 150–400)
RBC: 3.82 MIL/uL — ABNORMAL LOW (ref 4.22–5.81)
RDW: 12.7 % (ref 11.5–15.5)
WBC: 5.6 10*3/uL (ref 4.0–10.5)
nRBC: 0 % (ref 0.0–0.2)

## 2022-11-29 LAB — IRON AND TIBC
Iron: 44 ug/dL — ABNORMAL LOW (ref 45–182)
Saturation Ratios: 13 % — ABNORMAL LOW (ref 17.9–39.5)
TIBC: 350 ug/dL (ref 250–450)
UIBC: 306 ug/dL

## 2022-11-29 LAB — VITAMIN B12: Vitamin B-12: 495 pg/mL (ref 180–914)

## 2022-11-29 LAB — HEPATITIS B E ANTIGEN: Hep B E Ag: POSITIVE — AB

## 2022-11-29 LAB — HEPATITIS B DNA, ULTRAQUANTITATIVE, PCR
HBV DNA SERPL PCR-ACNC: 1360000 IU/mL
HBV DNA SERPL PCR-LOG IU: 6.134 log10 IU/mL

## 2022-11-29 LAB — RETICULOCYTES
Immature Retic Fract: 4.9 % (ref 2.3–15.9)
RBC.: 3.77 MIL/uL — ABNORMAL LOW (ref 4.22–5.81)
Retic Count, Absolute: 52.4 10*3/uL (ref 19.0–186.0)
Retic Ct Pct: 1.4 % (ref 0.4–3.1)

## 2022-11-29 LAB — SURGICAL PATHOLOGY

## 2022-11-29 LAB — HEPATITIS B E ANTIBODY: Hep B E Ab: NEGATIVE

## 2022-11-29 LAB — FERRITIN: Ferritin: 148 ng/mL (ref 24–336)

## 2022-11-29 LAB — FOLATE: Folate: 15.4 ng/mL (ref >5.9)

## 2022-11-29 NOTE — Progress Notes (Signed)
went to check on pt and noted he was on the prevena wound vac instead of the one we supply. Asked pt who switched him to the other VAC and he stated he did. Educated him not to switch equipment and touch things they are required to have certain settings. He is also been noted to OOB unassisted and educated to get assistance but non compliant. asked him not to turn off the bed alarms by himself on yesterday and today.

## 2022-11-29 NOTE — Progress Notes (Signed)
RE: Curtis Hodge Date of Birth: [birth year]   To Whom It May Concern:  Please be advised that the above-named patient will require a short-term nursing home stay - anticipated 30 days or less for rehabilitation and strengthening.  The plan is for return home.
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
## 2022-11-29 NOTE — Progress Notes (Signed)
Patient ID: Curtis Hodge, male   DOB: 1966-11-05, 57 y.o.   MRN: [id number] Patient is status post right below-knee amputation.  Patient was changed from the hospital VAC to [facility] and there was not a good suction fit.  I placed patient back on the hospital VAC.  Plan for discontinuation of the wound VAC dressing and apply a dry dressing at the time of discharge.

## 2022-11-29 NOTE — Progress Notes (Signed)
PROGRESS NOTE  Curtis Hodge  IDP:[id number] DOB: 01-08-1966 DOA: 11/21/2022 PCP: Pcp, No   Brief Narrative: Patient is a 57 year old male with history of COPD, HIV, schizophrenia, substance use disorder who presented to the emergency department on 11/21/2022 after he was found to be unresponsive at his friend's house.  Encephalopathic on presentation.  Found that he was using cocaine and subsequently became unresponsive, given Narcan on presentation, remained somnolent on ED.  Mental status significantly improved and then started having right foot pain.  Found to have fifth metatarsal head osteomyelitis. 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  Follows with Dr. Sharol Given.  Foot x-ray on 1/6 was concerning for osteomyelitis.  MRI was positive for osteomyelitis showing bone marrow edema of the distal aspect of the fifth metatarsal .  ID was consulted.   Patient was seen by Dr. Sharol Given.  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 PT and OT evaluation pending.  May need short-term rehab. Per ID he no longer requires antibiotics.  Toxic metabolic encephalopathy from cocaine ingestion Found to be unresponsive and brought to the ED.  Encephalopathic on presentation.  Likely secondary to opioid overdose, history of cocaine abuse.  Given multiple rounds of Narcan.  UDS positive for THC and cocaine.   Resolved.  Mentation is back to baseline.    Aspiration pneumonia/acute respiratory failure with hypoxia Suspected on presentation, was given Unasyn.  Antibiotics has been discontinued.  Respiratory status is stable.  Mild AKI Creatinine noted to be up to 1.26 on 1/9.  Has improved.  Monitor urine output.  History of emphysema/COPD Stable  Leukopenia/thrombocytopenia In the setting of HIV.  Continue to monitor. Counts are  stable.  History of HIV On Biktarvy. Prophylactic Bactrim to be resumed postoperatively.  Will resume from tomorrow.  Normocytic anemia Mild drop in hemoglobin is noted.  Could be dilutional versus surgical loss.  Will recheck tomorrow.  Anemia panel.  History of hyperlipidemia On Lipitor  Substance abuse Uses cocaine, THC.  TOC consulted    Moderate protein calorie malnutrition Nutrition Problem: Moderate Malnutrition Etiology: chronic illness (COPD, HIV, substance use disorder)    DVT prophylaxis: Lovenox CODE STATUS: Full code Family Communication: None at bedside Disposition: SNF?  Status is: Inpatient Remains inpatient appropriate because: no safe d/c plan    Consultants: ID, Ortho   Procedures: Right below-knee amputation on 1/10     Subjective: Still having pain on and off.  Objective: Vitals:   11/28/22 1948 11/29/22 0434 11/29/22 0756 11/29/22 1255  BP: (!) 154/74 (!) 154/81 (!) 161/88 (!) 143/80  Pulse: 90 79 85 95  Resp: 16 13 17 18   Temp: 98.2 F (36.8 C) 98 F (36.7 C) 98 F (36.7 C) 97.9 F (36.6 C)  TempSrc: Oral Oral Oral Oral  SpO2: 91% 100% 100% 94%  Weight:      Height:        Intake/Output Summary (Last 24 hours) at 11/29/2022 1256 Last data filed at 11/29/2022 0900 Gross per 24 hour  Intake 240 ml  Output 1350 ml  Net -1110 ml   Filed Weights   11/21/22 1754 11/22/22 0130  Weight: 62 kg 67.1 kg    Examination:   General: Appearance:    Well developed, well nourished male in no acute distress     Lungs:     Clear to auscultation bilaterally,  respirations unlabored  Heart:    Normal heart rate. Normal rhythm. No murmurs, rubs, or gallops.   MS:   Amputation of right foot leg-BK.   Neurologic:   Awake, alert,        Data Reviewed: I have personally reviewed following labs and imaging studies  CBC: Recent Labs  Lab 11/23/22 0531 11/25/22 0604 11/26/22 0520 11/28/22 0348 11/29/22 0408  WBC 2.4* 2.9* 3.8* 9.4  5.6  HGB 12.0* 13.7 13.5 12.2* 12.5*  HCT 37.8* 43.3 41.8 36.3* 36.8*  MCV 101.9* 102.9* 101.0* 97.1 96.3  PLT 116* 127* 137* 126* 160*   Basic Metabolic Panel: Recent Labs  Lab 11/23/22 0531 11/25/22 0604 11/26/22 0520 11/27/22 0350 11/28/22 0348 11/29/22 0408  NA 135 133* 135 134* 135 133*  K 4.0 4.1 4.7 4.1 4.2 3.7  CL 103 103 103 105 100 98  CO2 26 26 29 24 27 26   GLUCOSE 90 100* 100* 109* 123* 128*  BUN 16 14 21* 15 12 12   CREATININE 1.14 1.06 1.26* 0.99 0.84 1.05  CALCIUM 8.5* 8.6* 9.0 8.6* 8.7* 8.6*  MG 2.0 1.8  --   --  1.8  --   PHOS  --   --   --   --  4.4  --      Recent Results (from the past 240 hour(s))  MRSA Next Gen by PCR, Nasal     Status: None   Collection Time: 11/22/22 12:23 AM   Specimen: Nasal Mucosa; Nasal Swab  Result Value Ref Range Status   MRSA by PCR Next Gen NOT DETECTED NOT DETECTED Final    Comment: (NOTE) The GeneXpert MRSA Assay (FDA approved for NASAL specimens only), is one component of a comprehensive MRSA colonization surveillance program. It is not intended to diagnose MRSA infection nor to guide or monitor treatment for MRSA infections. Test performance is not FDA approved in patients less than 65 years old. Performed at [facility], [place] [address], [place], [place] [address]   Surgical pcr screen     Status: None   Collection Time: 11/26/22  9:51 PM   Specimen: Nasal Mucosa; Nasal Swab  Result Value Ref Range Status   MRSA, PCR NEGATIVE NEGATIVE Final   Staphylococcus aureus NEGATIVE NEGATIVE Final    Comment: (NOTE) The Xpert SA Assay (FDA approved for NASAL specimens in patients 55 years of age and older), is one component of a comprehensive surveillance program. It is not intended to diagnose infection nor to guide or monitor treatment. Performed at [facility], [place] [address] [place], [place] [address]      Radiology Studies: CT HEAD WO CONTRAST (5MM)  Result Date:  11/28/2022 CLINICAL DATA:  Mental status change EXAM: CT HEAD WITHOUT CONTRAST TECHNIQUE: Contiguous axial images were obtained from the base of the skull through the vertex without intravenous contrast. RADIATION DOSE REDUCTION: This exam was performed according to the departmental dose-optimization program which includes automated exposure control, adjustment of the mA and/or kV according to patient size and/or use of iterative reconstruction technique. COMPARISON:  CT head 11/02/2022 FINDINGS: Brain: No evidence of acute infarction, hemorrhage, hydrocephalus, extra-axial collection or mass lesion/mass effect. Vascular: No hyperdense vessel or unexpected calcification. Skull: Normal. Negative for fracture or focal lesion. Sinuses/Orbits: No acute finding. Other: None. IMPRESSION: No acute intracranial abnormality. Electronically Signed   By: Ronney Asters M.D.   On: 11/28/2022 20:33   EEG adult  Result Date: 11/28/2022 Lora Havens, MD  11/28/2022  5:32 PM Patient Name: Justino Boze MRN: [id number] Epilepsy Attending: Charlsie Quest Referring Physician/Provider: Mathews Argyle, NP Date: 11/28/2022 Duration: 2 hour 58 mins Patient history: 57 year old male with altered mental status.  EEG to evaluate for seizure. Level of alertness: Awake, asleep AEDs during EEG study: None Technical aspects: This EEG study was done with scalp electrodes positioned according to the 10-20 International system of electrode placement. Electrical activity was reviewed with band pass filter of 1-70Hz , sensitivity of 7 uV/mm, display speed of 40mm/sec with a 60Hz  notched filter applied as appropriate. EEG data were recorded continuously and digitally stored.  Video monitoring was available and reviewed as appropriate. Description: The posterior dominant rhythm consists of 9-10 Hz activity of moderate voltage (25-35 uV) seen predominantly in posterior head regions, symmetric and reactive to eye opening and eye closing.  Sleep was characterized by vertex waves, sleep spindles (12 to 14 Hz), maximal frontocentral region.  Patient was noted to be staring at 1149. Concomitant EEG showed normal posterior dominant rhythm. Hyperventilation and photic stimulation were not performed.   IMPRESSION: This study is within normal limits. No seizures or epileptiform discharges were seen throughout the recording. Patient was noted to be staring at 1149 without concomitant EEG change.  This was not an epileptic event. Priyanka    Scheduled Meds:  vitamin C  1,000 mg Oral Daily   atorvastatin  40 mg Oral Daily   bictegravir-emtricitabine-tenofovir AF  1 tablet Oral Daily   docusate sodium  100 mg Oral Daily   enoxaparin (LOVENOX) injection  40 mg Subcutaneous Q24H   feeding supplement  237 mL Oral TID BM   multivitamin with minerals  1 tablet Oral Daily   nutrition supplement (JUVEN)  1 packet Oral BID BM   pantoprazole  40 mg Oral Daily   senna-docusate  2 tablet Oral BID   sodium chloride flush  3 mL Intravenous Q12H   sulfamethoxazole-trimethoprim  1 tablet Oral Daily   zinc sulfate  220 mg Oral Daily   Continuous Infusions:  magnesium sulfate bolus IVPB       LOS: 7 days   Annabelle Harman, DO [facility] P1/10/2023, 12:56 PM

## 2022-11-29 NOTE — Progress Notes (Signed)
Level 2 PASRR received: [id number] E, valid until 12/29/22. Added to [place], MSW, LCSW [phone] (coverage)

## 2022-11-29 NOTE — Progress Notes (Signed)
Spoke to pt re PT recommendation for SNF. Pt reports agreeable to SNF, no preferred facility indicated. Reviewed SNF placement process and answered questions. Will begin SNF search and f/u with offers as available. Pt will require a level 2 PASRR review due to psych and substance use history.   Wandra Feinstein, MSW, LCSW [phone] (coverage)

## 2022-11-29 NOTE — Progress Notes (Signed)
Mobility Specialist Progress Note   11/29/22 1024  Mobility  Activity Ambulated with assistance in hallway  Level of Assistance Contact guard assist, steadying assist  Assistive Device Front wheel walker  Distance Ambulated (ft) 340 ft  RLE Weight Bearing NWB  Activity Response Tolerated well  Mobility Referral Yes  $Mobility charge 1 Mobility   Received pt in bed having no complaints and agreeable. CGA during ambulation w/ min cues for foot placement during hop gait. x2 standing rest breaks d/t increased pain in RLE but able to make it back to room w/o incident. Left in bed w/ all needs met and RN notified about pain.    Holland Falling Mobility Specialist Please contact via SecureChat or  Rehab office at [phone]

## 2022-11-29 NOTE — Progress Notes (Signed)
Pt home med provided to him from [place]

## 2022-11-30 DIAGNOSIS — M869 Osteomyelitis, unspecified: Secondary | ICD-10-CM | POA: Diagnosis not present

## 2022-11-30 LAB — BASIC METABOLIC PANEL
Anion gap: 8 (ref 5–15)
BUN: 17 mg/dL (ref 6–20)
CO2: 27 mmol/L (ref 22–32)
Calcium: 8.9 mg/dL (ref 8.9–10.3)
Chloride: 97 mmol/L — ABNORMAL LOW (ref 98–111)
Creatinine, Ser: 1.02 mg/dL (ref 0.61–1.24)
GFR, Estimated: >60 mL/min (ref >60)
Glucose, Bld: 107 mg/dL — ABNORMAL HIGH (ref 70–99)
Potassium: 4.1 mmol/L (ref 3.5–5.1)
Sodium: 132 mmol/L — ABNORMAL LOW (ref 135–145)

## 2022-11-30 LAB — CBC
HCT: 37.5 % — ABNORMAL LOW (ref 39.0–52.0)
Hemoglobin: 13 g/dL (ref 13.0–17.0)
MCH: 32.9 pg (ref 26.0–34.0)
MCHC: 34.7 g/dL (ref 30.0–36.0)
MCV: 94.9 fL (ref 80.0–100.0)
Platelets: 148 10*3/uL — ABNORMAL LOW (ref 150–400)
RBC: 3.95 MIL/uL — ABNORMAL LOW (ref 4.22–5.81)
RDW: 12.6 % (ref 11.5–15.5)
WBC: 5.8 10*3/uL (ref 4.0–10.5)
nRBC: 0 % (ref 0.0–0.2)

## 2022-11-30 NOTE — Plan of Care (Signed)
  Problem: Education: Goal: Knowledge of General Education information will improve Description: Including pain rating scale, medication(s)/side effects and non-pharmacologic comfort measures Outcome: Progressing   Problem: Activity: Goal: Risk for activity intolerance will decrease Outcome: Progressing   Problem: Pain Managment: Goal: General experience of comfort will improve Outcome: Progressing   Problem: Pain Management: Goal: Pain level will decrease with appropriate interventions Outcome: Progressing   

## 2022-11-30 NOTE — Progress Notes (Signed)
PROGRESS NOTE  Curtis Hodge  IDP:[id number] DOB: 27-Sep-1966 DOA: 11/21/2022 PCP: Pcp, No   Brief Narrative: Patient is a 57 year old male with history of COPD, HIV, schizophrenia, substance use disorder who presented to the emergency department on 11/21/2022 after he was found to be unresponsive at his friend's house.  Encephalopathic on presentation.  Found that he was using cocaine and subsequently became unresponsive, given Narcan on presentation, remained somnolent on ED.  Mental status significantly improved and then started having right foot pain.  Found to have fifth metatarsal head osteomyelitis. 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  Follows with Dr. Sharol Given.  Foot x-ray on 1/6 was concerning for osteomyelitis.  MRI was positive for osteomyelitis showing bone marrow edema of the distal aspect of the fifth metatarsal .  ID was consulted.   Patient was seen by Dr. Sharol Given.  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 PT and OT evaluation pending.  May need short-term rehab. Per ID he no longer requires antibiotics. -plan to remove wound vac PRIOR to d/c and apply dry dressing  Toxic metabolic encephalopathy from cocaine ingestion Found to be unresponsive and brought to the ED.  Encephalopathic on presentation.  Likely secondary to opioid overdose, history of cocaine abuse.  Given multiple rounds of Narcan.  UDS positive for THC and cocaine.   Resolved.  Mentation is back to baseline.    Aspiration pneumonia/acute respiratory failure with hypoxia Suspected on presentation, was given Unasyn.  Antibiotics has been discontinued.  Respiratory status is stable.  Mild AKI Creatinine noted to be up to 1.26 on 1/9.  Has improved.  Monitor urine output.  History of emphysema/COPD Stable  Leukopenia/thrombocytopenia In the  setting of HIV.  Continue to monitor. Counts are stable.  History of HIV On Biktarvy. Prophylactic Bactrim to be resumed postoperatively.  Will resume from tomorrow.  Normocytic anemia Mild drop in hemoglobin is noted.  Could be dilutional versus surgical loss.  Will recheck tomorrow.  Anemia panel.  History of hyperlipidemia On Lipitor  Substance abuse Uses cocaine, THC.  TOC consulted    Moderate protein calorie malnutrition Nutrition Problem: Moderate Malnutrition Etiology: chronic illness (COPD, HIV, substance use disorder)    DVT prophylaxis: Lovenox CODE STATUS: Full code Family Communication: None at bedside Disposition: SNF?  Status is: Inpatient Remains inpatient appropriate because: no safe d/c plan    Consultants: ID, Ortho   Procedures: Right below-knee amputation on 1/10     Subjective: Still having pain on and off.  Objective: Vitals:   11/29/22 0756 11/29/22 1255 11/29/22 2057 11/30/22 0518  BP: (!) 161/88 (!) 143/80 (!) 150/84 121/79  Pulse: 85 95 90 86  Resp: 17 18 16 13   Temp: 98 F (36.7 C) 97.9 F (36.6 C) 99.4 F (37.4 C) 98.3 F (36.8 C)  TempSrc: Oral Oral Oral Oral  SpO2: 100% 94% 92%   Weight:      Height:        Intake/Output Summary (Last 24 hours) at 11/30/2022 1134 Last data filed at 11/30/2022 0355 Gross per 24 hour  Intake 240 ml  Output 1050 ml  Net -810 ml   Filed Weights   11/21/22 1754 11/22/22 0130  Weight: 62 kg 67.1 kg    Examination:   General: Appearance:    Well developed, well nourished male in no acute distress  Lungs:     Clear to auscultation bilaterally, respirations unlabored  Heart:    Normal heart rate. Normal rhythm. No murmurs, rubs, or gallops.   MS:   Amputation of right foot leg-BK.   Neurologic:   sleeping        Data Reviewed: I have personally reviewed following labs and imaging studies  CBC: Recent Labs  Lab 11/25/22 0604 11/26/22 0520 11/28/22 0348 11/29/22 0408  11/30/22 0307  WBC 2.9* 3.8* 9.4 5.6 5.8  HGB 13.7 13.5 12.2* 12.5* 13.0  HCT 43.3 41.8 36.3* 36.8* 37.5*  MCV 102.9* 101.0* 97.1 96.3 94.9  PLT 127* 137* 126* 122* 948*   Basic Metabolic Panel: Recent Labs  Lab 11/25/22 0604 11/26/22 0520 11/27/22 0350 11/28/22 0348 11/29/22 0408 11/30/22 0307  NA 133* 135 134* 135 133* 132*  K 4.1 4.7 4.1 4.2 3.7 4.1  CL 103 103 105 100 98 97*  CO2 26 29 24 27 26 27   GLUCOSE 100* 100* 109* 123* 128* 107*  BUN 14 21* 15 12 12 17   CREATININE 1.06 1.26* 0.99 0.84 1.05 1.02  CALCIUM 8.6* 9.0 8.6* 8.7* 8.6* 8.9  MG 1.8  --   --  1.8  --   --   PHOS  --   --   --  4.4  --   --      Recent Results (from the past 240 hour(s))  MRSA Next Gen by PCR, Nasal     Status: None   Collection Time: 11/22/22 12:23 AM   Specimen: Nasal Mucosa; Nasal Swab  Result Value Ref Range Status   MRSA by PCR Next Gen NOT DETECTED NOT DETECTED Final    Comment: (NOTE) The GeneXpert MRSA Assay (FDA approved for NASAL specimens only), is one component of a comprehensive MRSA colonization surveillance program. It is not intended to diagnose MRSA infection nor to guide or monitor treatment for MRSA infections. Test performance is not FDA approved in patients less than 3 years old. Performed at [facility], [place] [address], [place], [place] [address]   Surgical pcr screen     Status: None   Collection Time: 11/26/22  9:51 PM   Specimen: Nasal Mucosa; Nasal Swab  Result Value Ref Range Status   MRSA, PCR NEGATIVE NEGATIVE Final   Staphylococcus aureus NEGATIVE NEGATIVE Final    Comment: (NOTE) The Xpert SA Assay (FDA approved for NASAL specimens in patients 56 years of age and older), is one component of a comprehensive surveillance program. It is not intended to diagnose infection nor to guide or monitor treatment. Performed at [facility], [place] [address], [place], [place] [address]      Radiology Studies: CT HEAD  WO CONTRAST (5MM)  Result Date: 11/28/2022 CLINICAL DATA:  Mental status change EXAM: CT HEAD WITHOUT CONTRAST TECHNIQUE: Contiguous axial images were obtained from the base of the skull through the vertex without intravenous contrast. RADIATION DOSE REDUCTION: This exam was performed according to the departmental dose-optimization program which includes automated exposure control, adjustment of the mA and/or kV according to patient size and/or use of iterative reconstruction technique. COMPARISON:  CT head 11/02/2022 FINDINGS: Brain: No evidence of acute infarction, hemorrhage, hydrocephalus, extra-axial collection or mass lesion/mass effect. Vascular: No hyperdense vessel or unexpected calcification. Skull: Normal. Negative for fracture or focal lesion. Sinuses/Orbits: No acute finding. Other: None. IMPRESSION: No acute intracranial abnormality. Electronically Signed   By: Ronney Asters M.D.   On: 11/28/2022 20:33   EEG  adult  Result Date: 11/28/2022 Charlsie Quest, MD     11/28/2022  5:32 PM Patient Name: Rohit Deloria MRN: [id number] Epilepsy Attending: Charlsie Quest Referring Physician/Provider: Mathews Argyle, NP Date: 11/28/2022 Duration: 2 hour 58 mins Patient history: 57 year old male with altered mental status.  EEG to evaluate for seizure. Level of alertness: Awake, asleep AEDs during EEG study: None Technical aspects: This EEG study was done with scalp electrodes positioned according to the 10-20 International system of electrode placement. Electrical activity was reviewed with band pass filter of 1-70Hz , sensitivity of 7 uV/mm, display speed of 78mm/sec with a 60Hz  notched filter applied as appropriate. EEG data were recorded continuously and digitally stored.  Video monitoring was available and reviewed as appropriate. Description: The posterior dominant rhythm consists of 9-10 Hz activity of moderate voltage (25-35 uV) seen predominantly in posterior head regions, symmetric and reactive  to eye opening and eye closing. Sleep was characterized by vertex waves, sleep spindles (12 to 14 Hz), maximal frontocentral region.  Patient was noted to be staring at 1149. Concomitant EEG showed normal posterior dominant rhythm. Hyperventilation and photic stimulation were not performed.   IMPRESSION: This study is within normal limits. No seizures or epileptiform discharges were seen throughout the recording. Patient was noted to be staring at 1149 without concomitant EEG change.  This was not an epileptic event. Priyanka    Scheduled Meds:  vitamin C  1,000 mg Oral Daily   atorvastatin  40 mg Oral Daily   bictegravir-emtricitabine-tenofovir AF  1 tablet Oral Daily   docusate sodium  100 mg Oral Daily   enoxaparin (LOVENOX) injection  40 mg Subcutaneous Q24H   feeding supplement  237 mL Oral TID BM   multivitamin with minerals  1 tablet Oral Daily   nutrition supplement (JUVEN)  1 packet Oral BID BM   pantoprazole  40 mg Oral Daily   senna-docusate  2 tablet Oral BID   sodium chloride flush  3 mL Intravenous Q12H   sulfamethoxazole-trimethoprim  1 tablet Oral Daily   zinc sulfate  220 mg Oral Daily   Continuous Infusions:  magnesium sulfate bolus IVPB       LOS: 8 days   Annabelle Harman, DO [facility] P1/13/2024, 11:34 AM

## 2022-12-01 DIAGNOSIS — M869 Osteomyelitis, unspecified: Secondary | ICD-10-CM | POA: Diagnosis not present

## 2022-12-01 LAB — CBC
HCT: 37.3 % — ABNORMAL LOW (ref 39.0–52.0)
Hemoglobin: 12.6 g/dL — ABNORMAL LOW (ref 13.0–17.0)
MCH: 32.4 pg (ref 26.0–34.0)
MCHC: 33.8 g/dL (ref 30.0–36.0)
MCV: 95.9 fL (ref 80.0–100.0)
Platelets: 156 10*3/uL (ref 150–400)
RBC: 3.89 MIL/uL — ABNORMAL LOW (ref 4.22–5.81)
RDW: 12.6 % (ref 11.5–15.5)
WBC: 5.6 10*3/uL (ref 4.0–10.5)
nRBC: 0 % (ref 0.0–0.2)

## 2022-12-01 LAB — BASIC METABOLIC PANEL
Anion gap: 7 (ref 5–15)
BUN: 18 mg/dL (ref 6–20)
CO2: 28 mmol/L (ref 22–32)
Calcium: 8.9 mg/dL (ref 8.9–10.3)
Chloride: 98 mmol/L (ref 98–111)
Creatinine, Ser: 1.13 mg/dL (ref 0.61–1.24)
GFR, Estimated: >60 mL/min (ref >60)
Glucose, Bld: 103 mg/dL — ABNORMAL HIGH (ref 70–99)
Potassium: 4.5 mmol/L (ref 3.5–5.1)
Sodium: 133 mmol/L — ABNORMAL LOW (ref 135–145)

## 2022-12-01 MED ORDER — PREGABALIN 25 MG PO CAPS
25.0000 mg | ORAL_CAPSULE | Freq: Every day | ORAL | Status: DC
  Administered 2022-12-01 – 2022-12-03 (×3): 25 mg via ORAL
  Filled 2022-12-01 (×3): qty 1

## 2022-12-01 NOTE — Progress Notes (Signed)
Mobility Specialist Progress Note    12/01/22 1144  Mobility  Activity Ambulated with assistance in hallway  Level of Assistance Contact guard assist, steadying assist  Assistive Device Front wheel walker  Distance Ambulated (ft) 270 ft  RLE Weight Bearing NWB  Activity Response Tolerated well  Mobility Referral Yes  $Mobility charge 1 Mobility   Pre-Mobility: 90 HR During Mobility: 120 HR Post-Mobility: 103 HR  Pt received sitting EOB and agreeable. No complaints on walk. Returned to bed with call bell in reach.    Hildred Alamin Mobility Specialist  Please Psychologist, sport and exercise or Rehab Office at [phone]

## 2022-12-01 NOTE — Progress Notes (Signed)
PROGRESS NOTE  Curtis Hodge  ZOX:[id number] DOB: [birth year] DOA: 11/21/2022 PCP: Pcp, No   Brief Narrative: Patient is a 57 year old male with history of COPD, HIV, schizophrenia, substance use disorder who presented to the emergency department on 11/21/2022 after he was found to be unresponsive at his friend's house.  Encephalopathic on presentation.  Found that he was using cocaine and subsequently became unresponsive, given Narcan on presentation, remained somnolent on ED.  Mental status significantly improved and then started having right foot pain.  Found to have fifth metatarsal head osteomyelitis. 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  Follows with Dr. Lajoyce Corners.  Foot x-ray on 1/6 was concerning for osteomyelitis.  MRI was positive for osteomyelitis showing bone marrow edema of the distal aspect of the fifth metatarsal .  ID was consulted.   Patient was seen by Dr. Lajoyce Corners.  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 PT and OT evaluation pending.  May need short-term rehab. Per ID he no longer requires antibiotics. -plan to remove wound vac PRIOR to d/c and apply dry dressing  Toxic metabolic encephalopathy from cocaine ingestion Found to be unresponsive and brought to the ED.  Encephalopathic on presentation.  Likely secondary to opioid overdose, history of cocaine abuse.  Given multiple rounds of Narcan.  UDS positive for THC and cocaine.   Resolved.  Mentation is back to baseline.    Aspiration pneumonia/acute respiratory failure with hypoxia Suspected on presentation, was given Unasyn.  Antibiotics has been discontinued.  Respiratory status is stable.  Mild AKI Creatinine noted to be up to 1.26 on 1/9.  Has improved.  Monitor urine output.  History of emphysema/COPD Stable  Leukopenia/thrombocytopenia In the  setting of HIV.  Continue to monitor. Counts are stable.  History of HIV On Biktarvy. Prophylactic Bactrim to be resumed postoperatively.  Will resume from tomorrow.  Normocytic anemia Mild drop in hemoglobin is noted.  Could be dilutional versus surgical loss.  Will recheck tomorrow.  Anemia panel.  History of hyperlipidemia On Lipitor  Substance abuse Uses cocaine, THC.  TOC consulted    Moderate protein calorie malnutrition Nutrition Problem: Moderate Malnutrition Etiology: chronic illness (COPD, HIV, substance use disorder)    DVT prophylaxis: Lovenox CODE STATUS: Full code Family Communication: None at bedside Disposition: SNF?  Status is: Inpatient Remains inpatient appropriate because: no safe d/c plan    Consultants: ID, Ortho   Procedures: Right below-knee amputation on 1/10     Subjective: Unfortunately has un-reasonable expectations in regards to pain control and d/c plan  Objective: Vitals:   11/30/22 0518 11/30/22 2114 12/01/22 0559 12/01/22 0826  BP: 121/79 132/77 122/77 132/79  Pulse: 86 92 83 98  Resp: 13 16 12 14   Temp: 98.3 F (36.8 C) 97.9 F (36.6 C) 98.3 F (36.8 C) 98.6 F (37 C)  TempSrc: Oral Oral Oral Oral  SpO2:  97% 97% 98%  Weight:      Height:        Intake/Output Summary (Last 24 hours) at 12/01/2022 1102 Last data filed at 12/01/2022 0600 Gross per 24 hour  Intake 480 ml  Output 800 ml  Net -320 ml   Filed Weights   11/21/22 1754 11/22/22 0130  Weight: 62 kg 67.1 kg    Examination:    General: Appearance:    Well developed, well nourished male in no  acute distress  Eyes:    PERRL, conjunctiva/corneas clear, EOM's intact       Lungs:     Clear to auscultation bilaterally, respirations unlabored  Heart:    Normal heart rate. Normal rhythm. No murmurs, rubs, or gallops.   MS:   Amputation of right leg BKA  Neurologic:   Awake, alert, oriented x 3. No apparent focal neurological           defect.          Data Reviewed: I have personally reviewed following labs and imaging studies  CBC: Recent Labs  Lab 11/26/22 0520 11/28/22 0348 11/29/22 0408 11/30/22 0307 12/01/22 0314  WBC 3.8* 9.4 5.6 5.8 5.6  HGB 13.5 12.2* 12.5* 13.0 12.6*  HCT 41.8 36.3* 36.8* 37.5* 37.3*  MCV 101.0* 97.1 96.3 94.9 95.9  PLT 137* 126* 122* 148* 683   Basic Metabolic Panel: Recent Labs  Lab 11/25/22 0604 11/26/22 0520 11/27/22 0350 11/28/22 0348 11/29/22 0408 11/30/22 0307 12/01/22 0314  NA 133*   < > 134* 135 133* 132* 133*  K 4.1   < > 4.1 4.2 3.7 4.1 4.5  CL 103   < > 105 100 98 97* 98  CO2 26   < > 24 27 26 27 28   GLUCOSE 100*   < > 109* 123* 128* 107* 103*  BUN 14   < > 15 12 12 17 18   CREATININE 1.06   < > 0.99 0.84 1.05 1.02 1.13  CALCIUM 8.6*   < > 8.6* 8.7* 8.6* 8.9 8.9  MG 1.8  --   --  1.8  --   --   --   PHOS  --   --   --  4.4  --   --   --    < > = values in this interval not displayed.     Recent Results (from the past 240 hour(s))  MRSA Next Gen by PCR, Nasal     Status: None   Collection Time: 11/22/22 12:23 AM   Specimen: Nasal Mucosa; Nasal Swab  Result Value Ref Range Status   MRSA by PCR Next Gen NOT DETECTED NOT DETECTED Final    Comment: (NOTE) The GeneXpert MRSA Assay (FDA approved for NASAL specimens only), is one component of a comprehensive MRSA colonization surveillance program. It is not intended to diagnose MRSA infection nor to guide or monitor treatment for MRSA infections. Test performance is not FDA approved in patients less than 69 years old. Performed at [facility], [place] [address], [place], [place] [address]   Surgical pcr screen     Status: None   Collection Time: 11/26/22  9:51 PM   Specimen: Nasal Mucosa; Nasal Swab  Result Value Ref Range Status   MRSA, PCR NEGATIVE NEGATIVE Final   Staphylococcus aureus NEGATIVE NEGATIVE Final    Comment: (NOTE) The Xpert SA Assay (FDA approved for NASAL specimens in patients  46 years of age and older), is one component of a comprehensive surveillance program. It is not intended to diagnose infection nor to guide or monitor treatment. Performed at [facility], [place] [address] [place], [place] [address]      Radiology Studies: No results found.  Scheduled Meds:  vitamin C  1,000 mg Oral Daily   atorvastatin  40 mg Oral Daily   bictegravir-emtricitabine-tenofovir AF  1 tablet Oral Daily   docusate sodium  100 mg Oral Daily   enoxaparin (LOVENOX) injection  40  mg Subcutaneous Q24H   feeding supplement  237 mL Oral TID BM   multivitamin with minerals  1 tablet Oral Daily   nutrition supplement (JUVEN)  1 packet Oral BID BM   pantoprazole  40 mg Oral Daily   senna-docusate  2 tablet Oral BID   sodium chloride flush  3 mL Intravenous Q12H   sulfamethoxazole-trimethoprim  1 tablet Oral Daily   zinc sulfate  220 mg Oral Daily   Continuous Infusions:  magnesium sulfate bolus IVPB       LOS: 9 days   Geradine Girt, DO [facility] P1/14/2024, 11:02 AM

## 2022-12-02 DIAGNOSIS — M869 Osteomyelitis, unspecified: Secondary | ICD-10-CM | POA: Diagnosis not present

## 2022-12-02 LAB — BASIC METABOLIC PANEL
Anion gap: 9 (ref 5–15)
BUN: 21 mg/dL — ABNORMAL HIGH (ref 6–20)
CO2: 25 mmol/L (ref 22–32)
Calcium: 9 mg/dL (ref 8.9–10.3)
Chloride: 96 mmol/L — ABNORMAL LOW (ref 98–111)
Creatinine, Ser: 1.06 mg/dL (ref 0.61–1.24)
GFR, Estimated: >60 mL/min (ref >60)
Glucose, Bld: 108 mg/dL — ABNORMAL HIGH (ref 70–99)
Potassium: 4.4 mmol/L (ref 3.5–5.1)
Sodium: 130 mmol/L — ABNORMAL LOW (ref 135–145)

## 2022-12-02 LAB — CBC
HCT: 39.1 % (ref 39.0–52.0)
Hemoglobin: 13.2 g/dL (ref 13.0–17.0)
MCH: 32.6 pg (ref 26.0–34.0)
MCHC: 33.8 g/dL (ref 30.0–36.0)
MCV: 96.5 fL (ref 80.0–100.0)
Platelets: 182 10*3/uL (ref 150–400)
RBC: 4.05 MIL/uL — ABNORMAL LOW (ref 4.22–5.81)
RDW: 12.4 % (ref 11.5–15.5)
WBC: 5.6 10*3/uL (ref 4.0–10.5)
nRBC: 0 % (ref 0.0–0.2)

## 2022-12-02 MED ORDER — ORAL CARE MOUTH RINSE: 15.0000 mL | OROMUCOSAL | Status: DC | PRN

## 2022-12-02 NOTE — Progress Notes (Signed)
PROGRESS NOTE  Curtis Hodge  MEQ:[id number] DOB: 08-08-1966 DOA: 11/21/2022 PCP: Pcp, No   Brief Narrative: Patient is a 57 year old male with history of COPD, HIV, schizophrenia, substance use disorder who presented to the emergency department on 11/21/2022 after he was found to be unresponsive at his friend's house.  Encephalopathic on presentation.  Found that he was using cocaine and subsequently became unresponsive, given Narcan on presentation, remained somnolent on ED.  Mental status significantly improved and then started having right foot pain.  Found to have fifth metatarsal head osteomyelitis. 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  Follows with Dr. Sharol Given.  Foot x-ray on 1/6 was concerning for osteomyelitis.  MRI was positive for osteomyelitis showing bone marrow edema of the distal aspect of the fifth metatarsal .  ID was consulted.   Patient was seen by Dr. Sharol Given.  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 PT and OT evaluation pending.  May need short-term rehab. Per ID he no longer requires antibiotics. -plan to remove wound vac PRIOR to d/c and apply dry dressing  Toxic metabolic encephalopathy from cocaine ingestion Found to be unresponsive and brought to the ED.  Encephalopathic on presentation.  Likely secondary to opioid overdose, history of cocaine abuse.  Given multiple rounds of Narcan.  UDS positive for THC and cocaine.   Resolved.  Mentation is back to baseline.    Aspiration pneumonia/acute respiratory failure with hypoxia Suspected on presentation, was given Unasyn.  Antibiotics has been discontinued.  Respiratory status is stable.  Mild AKI Creatinine noted to be up to 1.26 on 1/9.  Has improved.  Monitor urine output.  History of  emphysema/COPD Stable  Leukopenia/thrombocytopenia In the setting of HIV.  Continue to monitor. Counts are stable.  History of HIV On Biktarvy. Prophylactic Bactrim to be resumed postoperatively.  Will resume from tomorrow.  Normocytic anemia -stable  History of hyperlipidemia On Lipitor  Substance abuse Uses cocaine, THC.  TOC consulted  Hyponatremia -trend -work up if continues to drop   Moderate protein calorie malnutrition Nutrition Problem: Moderate Malnutrition Etiology: chronic illness (COPD, HIV, substance use disorder)    DVT prophylaxis: Lovenox CODE STATUS: Full code Family Communication: None at bedside Disposition: SNF?  Status is: Inpatient Remains inpatient appropriate because: no safe d/c plan- ? SNF vs home    Consultants: ID, Ortho   Procedures: Right below-knee amputation on 1/10     Subjective: Pain better with low dose lyrica  Objective: Vitals:   12/01/22 1728 12/01/22 2027 12/02/22 0509 12/02/22 0743  BP: 130/70 136/76 (!) 121/107 125/77  Pulse: 85 97 93 86  Resp: 18 18 17    Temp: 98 F (36.7 C) 98.5 F (36.9 C) 98 F (36.7 C) 97.7 F (36.5 C)  TempSrc:  Oral Oral Oral  SpO2: 100% 100% 100% 92%  Weight:      Height:       No intake or output data in the 24 hours ending 12/02/22 1137  Filed Weights   11/21/22 1754 11/22/22 0130  Weight: 62 kg 67.1 kg    Examination:    General: Appearance:    Well developed, well nourished male in no acute distress     Lungs:      respirations unlabored  Heart:    Normal heart rate.   MS:  Amputation of right leg BKA  Neurologic:   Awake, alert, oriented x 3. No apparent focal neurological           defect.         Data Reviewed: I have personally reviewed following labs and imaging studies  CBC: Recent Labs  Lab 11/28/22 0348 11/29/22 0408 11/30/22 0307 12/01/22 0314 12/02/22 0326  WBC 9.4 5.6 5.8 5.6 5.6  HGB 12.2* 12.5* 13.0 12.6* 13.2  HCT 36.3* 36.8* 37.5*  37.3* 39.1  MCV 97.1 96.3 94.9 95.9 96.5  PLT 126* 122* 148* 156 268   Basic Metabolic Panel: Recent Labs  Lab 11/28/22 0348 11/29/22 0408 11/30/22 0307 12/01/22 0314 12/02/22 0326  NA 135 133* 132* 133* 130*  K 4.2 3.7 4.1 4.5 4.4  CL 100 98 97* 98 96*  CO2 27 26 27 28 25   GLUCOSE 123* 128* 107* 103* 108*  BUN 12 12 17 18  21*  CREATININE 0.84 1.05 1.02 1.13 1.06  CALCIUM 8.7* 8.6* 8.9 8.9 9.0  MG 1.8  --   --   --   --   PHOS 4.4  --   --   --   --      Recent Results (from the past 240 hour(s))  Surgical pcr screen     Status: None   Collection Time: 11/26/22  9:51 PM   Specimen: Nasal Mucosa; Nasal Swab  Result Value Ref Range Status   MRSA, PCR NEGATIVE NEGATIVE Final   Staphylococcus aureus NEGATIVE NEGATIVE Final    Comment: (NOTE) The Xpert SA Assay (FDA approved for NASAL specimens in patients 58 years of age and older), is one component of a comprehensive surveillance program. It is not intended to diagnose infection nor to guide or monitor treatment. Performed at [facility], [place] [address] [place],  [address]      Radiology Studies: No results found.  Scheduled Meds:  vitamin C  1,000 mg Oral Daily   atorvastatin  40 mg Oral Daily   bictegravir-emtricitabine-tenofovir AF  1 tablet Oral Daily   docusate sodium  100 mg Oral Daily   enoxaparin (LOVENOX) injection  40 mg Subcutaneous Q24H   feeding supplement  237 mL Oral TID BM   multivitamin with minerals  1 tablet Oral Daily   nutrition supplement (JUVEN)  1 packet Oral BID BM   pantoprazole  40 mg Oral Daily   pregabalin  25 mg Oral Daily   senna-docusate  2 tablet Oral BID   sodium chloride flush  3 mL Intravenous Q12H   sulfamethoxazole-trimethoprim  1 tablet Oral Daily   zinc sulfate  220 mg Oral Daily   Continuous Infusions:  magnesium sulfate bolus IVPB       LOS: 10 days   Geradine Girt, DO [facility] P1/15/2024, 11:37 AM

## 2022-12-02 NOTE — Consult Note (Signed)
[facility] Nurse Consult Note: Reason for Consult: Right BKA 11/27/22.  Orders to remove VAC dressing.  Patient states that this is because it is painful and there is no drainage in canister. Plans to discharge soon.  Wound type: surgical staple line with NPWT (VAC) dressing in place.  Prevena incisional VAC was alarming so hospital unit was placed onto original dressing.  Was showing seal of 100 mmHg when I came into room and gurgling at times.  Pressure Injury POA: NA Measurement: Staple line  2 cm x 13 cm intact with some bleeding when dressing removed.  Wound bed: staples approximated, not visible Drainage (amount, consistency, odor) minimal bleeding around staples noted.  VAC sponge has noted bloody effluent.   Periwound: intact  skin is pale consistent with some maceration, but intact  photo in chart.  Dressing procedure/placement/frequency: Skin cleansed with soap and water.  Dry gauze applied to staple line.  Covered with kerlix and gauze. Change daily.  Will not follow at this time.  Please re-consult if needed.  Estrellita Ludwig MSN, RN, FNP-BC CWON Wound, Ostomy, Continence Nurse [facility] [phone] Pager [phone]

## 2022-12-02 NOTE — TOC Progression Note (Signed)
Transition of Care [facility]) - Progression Note    Patient Details  Name: Curtis Hodge MRN: [id number] Date of Birth: [birth year]  [facility] CM/SW Contact  Joanne Chars, LCSW Phone Number: 12/02/2022, 3:15 PM  Clinical Narrative:  Pt does not have SNF bed offers.  CSW met with pt to discuss.  Pt would like to go to SNF, barriers to this were discussed.  Pt gives permission to send out referral to wider area, which was done.  Pt was living with some friends, prefers to not return to that situation.  He has been calling some other people to see if something else can be arranged.   Pt reports that he has a case worker, Delana Meyer, with [employer], [phone].  Permission given to speak to her.    CSW spoke to [place] and she has been working with pt on more stable housing for some time, so far unsuccessfully.  She is planning to refer hi to a Transitional Community Living program and she will continue to follow up with him after he leaves the hospital.      Expected Discharge Plan: [place] (vs SNF vs CIR;  resides with friend [name]) Barriers to Discharge: Continued Medical Work up  Expected Discharge Plan and Services In-house Referral: NA Discharge Planning Services: CM Consult   Living arrangements for the past 2 months: Single Family Home                 DME Arranged: N/A DME Agency: NA       HH Arranged: NA [place] Agency: NA         Social Determinants of Health (SDOH) Interventions SDOH Screenings   Food Insecurity: Unknown (11/22/2022)  Transportation Needs: Unknown (11/22/2022)  Utilities: Unknown (11/22/2022)  Alcohol Screen: Low Risk  (10/25/2017)  Depression (PHQ2-9): High Risk (02/07/2022)  Tobacco Use: High Risk (11/28/2022)    Readmission Risk Interventions     No data to display

## 2022-12-02 NOTE — Progress Notes (Signed)
Physical Therapy Treatment Patient Details Name: Curtis Hodge MRN: [id number] DOB: [birth year] Today's Date: 12/02/2022   History of Present Illness 57 yo male presents to [facility] on 11/23/22 with respiratory failure likely due to unintentional opioid overdose. Pt also with R foot pain and finally underwent R BKA at [facility] on 1/10. PMH includes: HIV noncompliant with medications, chronic hepatitis B, COPD/emphysema, anxiety/depression, R transmet amputation, schizophrenia, homelessness.    PT Comments    Pt received in chair, eager to participate in therapy session, pt A&O today with improved activity tolerance. Pt making good progress toward goals, still with mildly decreased safety with transfers but pt has met gait goal and with good RW management. Pt instruction on phantom pain reduction strategies and likely progression of therapies (OPPT, prosthetic consult only once RLE begins to heal and MD approves RLE WB), neutral hip posture and importance of limb guard/knee extension at rest. Pt receptive to all instruction. Pt asking for resources for hotels or places he can stay with his therapy dog. Pt continues to benefit from PT services to progress toward functional mobility goals. Disposition updated below per pt progress, discussed with supervising PT Vicky M. Pt needs goal update next session.    Recommendations for follow up therapy are one component of a multi-disciplinary discharge planning process, led by the attending physician.  Recommendations may be updated based on patient status, additional functional criteria and insurance authorization.  Follow Up Recommendations  Outpatient PT Can patient physically be transported by private vehicle: Yes   Assistance Recommended at Discharge Frequent or constant Supervision/Assistance  Patient can return home with the following Help with stairs or ramp for entrance;A little help with walking and/or transfers;A little help with  bathing/dressing/bathroom;Assistance with cooking/housework   Equipment Recommendations  Rolling walker (2 wheels) (consult to prosthetist once RLE appropriately healed)    Recommendations for Other Services       Precautions / Restrictions Precautions Precautions: Fall Precaution Comments: R BKA with wound vac; Hx of HIV, Hep B Required Braces or Orthoses: Other Brace Other Brace: RLE limb protector and limb shrinker sock Restrictions Weight Bearing Restrictions: Yes RLE Weight Bearing: Non weight bearing     Mobility  Bed Mobility               General bed mobility comments: pt up in chair    Transfers Overall transfer level: Needs assistance Equipment used: Rolling walker (2 wheels) Transfers: Sit to/from Stand Sit to Stand: Supervision, Min guard           General transfer comment: cues for safer UE placement, especially to chair without arm rests, pt not following cues to reach back at times/to push from chair and prefers to press down into RW. Discussion on why that's not as safe.    Ambulation/Gait Ambulation/Gait assistance: Supervision Gait Distance (Feet): 150 Feet (x2 with seated break while RW adjusted) Assistive device: Rolling walker (2 wheels) Gait Pattern/deviations:  (hop to) Gait velocity: reduced     General Gait Details: Pt was able to safely hop with RW. Discussed the importance of using RW and not hopping without UE support. Pt now without wound vac and pt able to don shrinker/limb guard with verbal cues and good RW management during gait; RW adjusted 1 click lower mid-session and pt reports improved comfort at shoulders.   Stairs             Wheelchair Mobility    Modified Rankin (Stroke Patients Only)  Balance Overall balance assessment: Needs assistance Sitting-balance support: No upper extremity supported, Feet supported Sitting balance-Leahy Scale: Good Sitting balance - Comments: sitting EOB   Standing  balance support: Bilateral upper extremity supported, During functional activity, Reliant on assistive device for balance Standing balance-Leahy Scale: Fair Standing balance comment: able to stand with only one hand on RW briefly and no LOB; BUE support for dynamic tasks needed                            Cognition Arousal/Alertness: Awake/alert Behavior During Therapy: Impulsive, Restless Overall Cognitive Status: No family/caregiver present to determine baseline cognitive functioning Area of Impairment: Attention                   Current Attention Level: Selective Memory: Decreased recall of precautions Following Commands: Follows one step commands consistently, Follows multi-step commands consistently Safety/Judgement: Decreased awareness of safety   Problem Solving: Requires verbal cues General Comments: Pt has a history of Schizophrenia and PTSD. Pt with improved alertness and command following today, using RW better and with improved standing/gait tolerance. Pt does not follow some safety cues (to reach back for chair when sitting/standing) but this appears to be more volitional than due to not understanding. Reinforced safety with him. Chair alarm on for safety also.        Exercises      General Comments General comments (skin integrity, edema, etc.): RLE elevated on distal end and ice applied post-ambulation in recliner      Pertinent Vitals/Pain Pain Assessment Pain Assessment: 0-10 Pain Score: 7  Pain Location: Right residual limb with movement and dependent position Pain Descriptors / Indicators: Grimacing, Pressure (c/o phantom pain) Pain Intervention(s): Monitored during session, Repositioned, Patient requesting pain meds-RN notified, Ice applied (reviewed phantom pain relief strategies (tapping, wash cloth/piece of cotton circles, gentle))     PT Goals (current goals can now be found in the care plan section) Acute Rehab PT Goals Patient Stated  Goal: find a new place to live PT Goal Formulation: With patient Time For Goal Achievement: 12/12/22 Progress towards PT goals: Progressing toward goals    Frequency    Min 3X/week      PT Plan Discharge plan needs to be updated       AM-PAC PT "6 Clicks" Mobility   Outcome Measure  Help needed turning from your back to your side while in a flat bed without using bedrails?: None Help needed moving from lying on your back to sitting on the side of a flat bed without using bedrails?: None Help needed moving to and from a bed to a chair (including a wheelchair)?: A Little Help needed standing up from a chair using your arms (e.g., wheelchair or bedside chair)?: A Little Help needed to walk in hospital room?: A Little Help needed climbing 3-5 steps with a railing? : Total 6 Click Score: 18    End of Session Equipment Utilized During Treatment: Gait belt (RLE limb guard) Activity Tolerance: Patient tolerated treatment well Patient left: with call bell/phone within reach;in chair;with chair alarm set (RLE elevated, ice pack over limb) Nurse Communication: Mobility status;Precautions;Other (comment);Patient requests pain meds (chair alarm on) PT Visit Diagnosis: Other abnormalities of gait and mobility (R26.89);Difficulty in walking, not elsewhere classified (R26.2);Pain Pain - Right/Left: Right Pain - part of body: Leg     Time: 1610-9604 PT Time Calculation (min) (ACUTE ONLY): 25 min  Charges:  $Gait Training: 8-22 mins $  Therapeutic Activity: 8-22 mins                     Keynan Heffern P., PTA Acute Rehabilitation Services Secure Chat Preferred 9a-5:30pm Office: [place] 12/02/2022, 7:17 PM

## 2022-12-03 ENCOUNTER — Other Ambulatory Visit (HOSPITAL_COMMUNITY): Payer: Self-pay

## 2022-12-03 DIAGNOSIS — M869 Osteomyelitis, unspecified: Secondary | ICD-10-CM | POA: Diagnosis not present

## 2022-12-03 LAB — BASIC METABOLIC PANEL
Anion gap: 7 (ref 5–15)
BUN: 19 mg/dL (ref 6–20)
CO2: 29 mmol/L (ref 22–32)
Calcium: 9.3 mg/dL (ref 8.9–10.3)
Chloride: 96 mmol/L — ABNORMAL LOW (ref 98–111)
Creatinine, Ser: 1.06 mg/dL (ref 0.61–1.24)
GFR, Estimated: >60 mL/min (ref >60)
Glucose, Bld: 91 mg/dL (ref 70–99)
Potassium: 5.1 mmol/L (ref 3.5–5.1)
Sodium: 132 mmol/L — ABNORMAL LOW (ref 135–145)

## 2022-12-03 MED ORDER — PREGABALIN 25 MG PO CAPS
25.0000 mg | ORAL_CAPSULE | Freq: Every day | ORAL | 0 refills | Status: DC
  Filled 2022-12-03: qty 30, 30d supply, fill #0

## 2022-12-03 MED ORDER — OXYCODONE HCL 10 MG PO TABS
10.0000 mg | ORAL_TABLET | ORAL | 0 refills | Status: AC | PRN
  Filled 2022-12-03: qty 30, 5d supply, fill #0

## 2022-12-03 MED ORDER — BIKTARVY 50-200-25 MG PO TABS
1.0000 | ORAL_TABLET | Freq: Every day | ORAL | 1 refills | Status: DC
  Filled 2022-12-03: qty 30, 30d supply, fill #0

## 2022-12-03 MED ORDER — ZINC SULFATE 220 (50 ZN) MG PO TABS
220.0000 mg | ORAL_TABLET | Freq: Every day | ORAL | 0 refills | Status: DC
  Filled 2022-12-03: qty 14, 14d supply, fill #0

## 2022-12-03 MED ORDER — SULFAMETHOXAZOLE-TRIMETHOPRIM 800-160 MG PO TABS
1.0000 | ORAL_TABLET | Freq: Every day | ORAL | 1 refills | Status: DC
  Filled 2022-12-03: qty 30, 30d supply, fill #0

## 2022-12-03 NOTE — TOC Transition Note (Signed)
Transition of Care [facility]) - CM/SW Discharge Note   Patient Details  Name: Curtis Hodge MRN: [id number] Date of Birth: 09-02-1966  Transition of Care [facility]) CM/SW Contact:  Curtis Mons, RN Phone Number: 12/03/2022, 12:09 PM   Clinical Narrative:    Patient will DC to: home Anticipated DC date: 12/03/2022 Family notified: yes Transport by: TAXI         - respiratory failure ? 2/2  unintentional opioid overdose        - S/P R BKA at [facility] on 1/10   Per MD patient ready for DC today . RN, and patient aware of DC. Pt states willing tp transition back to  friend Curtis Hodge's residence, [address] [place]. States unable to reside with family. States has spoken with Medicaid SW Curtis Hodge and she is assisting him with his housing concerns.  NCM provided pt with Medicaid Transportation # [phone]) for transportation assessment screening. Pt can use services for transportation for therapy and MD visits. Pt agreeable to outpatient PT services. Referral made with [facility]. and noted on AVS.  Post hospital f/u noted on AVS.  [facility] information placed on AVS if needed.  [place] pharmacy to deliver RX meds to bedside prior to d/c.  Pt without transportation to home.  RNCM will sign off for now as intervention is no longer needed. Please consult [place] again if new needs arise.     Final next level of care: Home/Self Care Barriers to Discharge: No Barriers Identified   Patient Goals and CMS Choice   Choice offered to / list presented to : Patient  Discharge Placement    Discharge Plan and Services Additional resources added to the After Visit Summary for   In-house Referral: NA Discharge Planning Services: CM Consult            DME Arranged: Gilford Rile rolling DME Agency: [employer] Date DME Agency Contacted: 12/03/22 Time DME Agency Contacted: [phone] Representative spoke with at DME Agency: [place]: NA [place] Agency: NA        Social  Determinants of Health ([place]) Interventions SDOH Screenings   Food Insecurity: Unknown (11/22/2022)  Transportation Needs: Unknown (11/22/2022)  Utilities: Unknown (11/22/2022)  Alcohol Screen: Low Risk  (10/25/2017)  Depression (PHQ2-9): High Risk (02/07/2022)  Tobacco Use: High Risk (11/28/2022)     Readmission Risk Interventions     No data to display
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
## 2022-12-03 NOTE — Plan of Care (Signed)
  Problem: Education: Goal: Knowledge of General Education information will improve Description Including pain rating scale, medication(s)/side effects and non-pharmacologic comfort measures Outcome: Adequate for Discharge   Problem: Health Behavior/Discharge Planning: Goal: Ability to manage health-related needs will improve Outcome: Adequate for Discharge   Problem: Clinical Measurements: Goal: Ability to maintain clinical measurements within normal limits will improve Outcome: Adequate for Discharge Goal: Will remain free from infection Outcome: Adequate for Discharge Goal: Diagnostic test results will improve Outcome: Adequate for Discharge Goal: Respiratory complications will improve Outcome: Adequate for Discharge Goal: Cardiovascular complication will be avoided Outcome: Adequate for Discharge   Problem: Activity: Goal: Risk for activity intolerance will decrease Outcome: Adequate for Discharge   Problem: Nutrition: Goal: Adequate nutrition will be maintained Outcome: Adequate for Discharge   Problem: Coping: Goal: Level of anxiety will decrease Outcome: Adequate for Discharge   Problem: Elimination: Goal: Will not experience complications related to bowel motility Outcome: Adequate for Discharge Goal: Will not experience complications related to urinary retention Outcome: Adequate for Discharge   Problem: Pain Managment: Goal: General experience of comfort will improve Outcome: Adequate for Discharge   Problem: Safety: Goal: Ability to remain free from injury will improve Outcome: Adequate for Discharge   Problem: Skin Integrity: Goal: Risk for impaired skin integrity will decrease Outcome: Adequate for Discharge   Problem: Education: Goal: Knowledge of the prescribed therapeutic regimen will improve Outcome: Adequate for Discharge Goal: Ability to verbalize activity precautions or restrictions will improve Outcome: Adequate for Discharge Goal:  Understanding of discharge needs will improve Outcome: Adequate for Discharge   Problem: Activity: Goal: Ability to perform//tolerate increased activity and mobilize with assistive devices will improve Outcome: Adequate for Discharge   Problem: Clinical Measurements: Goal: Postoperative complications will be avoided or minimized Outcome: Adequate for Discharge   Problem: Self-Care: Goal: Ability to meet self-care needs will improve Outcome: Adequate for Discharge   Problem: Self-Concept: Goal: Ability to maintain and perform role responsibilities to the fullest extent possible will improve Outcome: Adequate for Discharge   Problem: Pain Management: Goal: Pain level will decrease with appropriate interventions Outcome: Adequate for Discharge   Problem: Skin Integrity: Goal: Demonstration of wound healing without infection will improve Outcome: Adequate for Discharge   

## 2022-12-03 NOTE — Progress Notes (Addendum)
Mobility Specialist Progress Note   12/03/22 1117  Mobility  Activity Ambulated with assistance in room  Level of Assistance Contact guard assist, steadying assist  Assistive Device Front wheel walker  Distance Ambulated (ft) 12 ft  RLE Weight Bearing NWB  Activity Response Tolerated well  Mobility Referral Yes  $Mobility charge 1 Mobility   Received pt in room agitated prepping for d/c w/o an AD. Advised and encouraged pt when standing to always use an AD for safety and support. Assisted pt back to bed w/o fault. Reiterated WB precautions and educated on mobility tips for when at home. Pt still irritated about d/c upon dismissal.  Holland Falling Mobility Specialist Please contact via SecureChat or  Rehab office at [phone]

## 2022-12-04 ENCOUNTER — Telehealth: Payer: Self-pay | Admitting: Orthopedic Surgery

## 2022-12-04 NOTE — Telephone Encounter (Signed)
The pt was d/c from the hospital yesterday and can follow up in 1 week with Erin. She has openings for Tuesday and Wednesday of next week can you please call to sch?

## 2022-12-04 NOTE — Telephone Encounter (Signed)
Patient need to be seen for P/O from the 10th of this month, I dont have any openings till feb. Please advise.[phone].

## 2022-12-09 ENCOUNTER — Other Ambulatory Visit: Payer: Self-pay | Admitting: Orthopedic Surgery

## 2022-12-09 ENCOUNTER — Telehealth: Payer: Self-pay | Admitting: Orthopedic Surgery

## 2022-12-09 ENCOUNTER — Other Ambulatory Visit: Payer: Self-pay

## 2022-12-09 MED ORDER — OXYCODONE-ACETAMINOPHEN 5-325 MG PO TABS
1.0000 | ORAL_TABLET | ORAL | 0 refills | Status: DC | PRN
  Filled 2022-12-09: qty 20, 4d supply, fill #0

## 2022-12-09 NOTE — Telephone Encounter (Signed)
Called and notified patient
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
## 2022-12-10 ENCOUNTER — Encounter: Payer: Medicaid Other | Admitting: Family

## 2022-12-12 ENCOUNTER — Inpatient Hospital Stay: Payer: Medicaid Other | Admitting: Family

## 2022-12-13 ENCOUNTER — Inpatient Hospital Stay: Payer: Medicaid Other | Admitting: Family

## 2022-12-13 ENCOUNTER — Encounter: Payer: Medicaid Other | Admitting: Family

## 2022-12-16 ENCOUNTER — Emergency Department (HOSPITAL_COMMUNITY)
Admission: EM | Admit: 2022-12-16 | Discharge: 2022-12-17 | Disposition: A | Discharge status: Home / Self Care | Payer: Medicaid Other | Attending: Emergency Medicine | Admitting: Emergency Medicine

## 2022-12-16 ENCOUNTER — Other Ambulatory Visit: Payer: Self-pay

## 2022-12-16 ENCOUNTER — Emergency Department (HOSPITAL_COMMUNITY): Payer: Medicaid Other

## 2022-12-16 DIAGNOSIS — Z79899 Other long term (current) drug therapy: Secondary | ICD-10-CM | POA: Insufficient documentation

## 2022-12-16 DIAGNOSIS — M9689 Other intraoperative and postprocedural complications and disorders of the musculoskeletal system: Secondary | ICD-10-CM | POA: Insufficient documentation

## 2022-12-16 DIAGNOSIS — I10 Essential (primary) hypertension: Secondary | ICD-10-CM | POA: Insufficient documentation

## 2022-12-16 DIAGNOSIS — Z21 Asymptomatic human immunodeficiency virus [HIV] infection status: Secondary | ICD-10-CM | POA: Diagnosis not present

## 2022-12-16 LAB — COMPREHENSIVE METABOLIC PANEL
ALT: 47 U/L — ABNORMAL HIGH (ref 0–44)
AST: 42 U/L — ABNORMAL HIGH (ref 15–41)
Albumin: 3.2 g/dL — ABNORMAL LOW (ref 3.5–5.0)
Alkaline Phosphatase: 75 U/L (ref 38–126)
Anion gap: 8 (ref 5–15)
BUN: 13 mg/dL (ref 6–20)
CO2: 25 mmol/L (ref 22–32)
Calcium: 8.7 mg/dL — ABNORMAL LOW (ref 8.9–10.3)
Chloride: 100 mmol/L (ref 98–111)
Creatinine, Ser: 0.98 mg/dL (ref 0.61–1.24)
GFR, Estimated: >60 mL/min (ref >60)
Glucose, Bld: 92 mg/dL (ref 70–99)
Potassium: 3.9 mmol/L (ref 3.5–5.1)
Sodium: 133 mmol/L — ABNORMAL LOW (ref 135–145)
Total Bilirubin: 0.4 mg/dL (ref 0.3–1.2)
Total Protein: 7.3 g/dL (ref 6.5–8.1)

## 2022-12-16 LAB — CBC WITH DIFFERENTIAL/PLATELET
Abs Immature Granulocytes: 0.01 10*3/uL (ref 0.00–0.07)
Basophils Absolute: 0 10*3/uL (ref 0.0–0.1)
Basophils Relative: 1 %
Eosinophils Absolute: 0.1 10*3/uL (ref 0.0–0.5)
Eosinophils Relative: 2 %
HCT: 37.5 % — ABNORMAL LOW (ref 39.0–52.0)
Hemoglobin: 12.3 g/dL — ABNORMAL LOW (ref 13.0–17.0)
Immature Granulocytes: 0 %
Lymphocytes Relative: 39 %
Lymphs Abs: 1.7 10*3/uL (ref 0.7–4.0)
MCH: 32.5 pg (ref 26.0–34.0)
MCHC: 32.8 g/dL (ref 30.0–36.0)
MCV: 98.9 fL (ref 80.0–100.0)
Monocytes Absolute: 0.3 10*3/uL (ref 0.1–1.0)
Monocytes Relative: 6 %
Neutro Abs: 2.4 10*3/uL (ref 1.7–7.7)
Neutrophils Relative %: 52 %
Platelets: 190 10*3/uL (ref 150–400)
RBC: 3.79 MIL/uL — ABNORMAL LOW (ref 4.22–5.81)
RDW: 12.8 % (ref 11.5–15.5)
WBC: 4.4 10*3/uL (ref 4.0–10.5)
nRBC: 0 % (ref 0.0–0.2)

## 2022-12-16 MED ORDER — OXYCODONE HCL 5 MG PO TABS
10.0000 mg | ORAL_TABLET | Freq: Once | ORAL | Status: AC
  Administered 2022-12-16: 10 mg via ORAL
  Filled 2022-12-16: qty 2

## 2022-12-16 NOTE — Discharge Instructions (Signed)
Keep dressing in place for tonight.  Call Dr. Gavin Potters office in the morning-- they will give you a time frame to come in and have incision re-checked. Return here for new concerns.

## 2022-12-16 NOTE — ED Provider Notes (Signed)
[facility] Provider Note   CSN: [id number] Arrival date & time: 12/16/22  2038     History {Add pertinent medical, surgical, social history, OB history to HPI:1} Chief Complaint  Patient presents with   Post-op Problem    Curtis Hodge is a 57 y.o. male.  The history is provided by the patient and medical records.   57 y.o. M with hx of Hep B, HTN, HIV, depression, schizophrenia, presenting to the ED for post-op problem.  Patient states right BKA by Dr. Sharol Given on 11/27/22.  States procedure went as planned without complications.  States he has since run out of his oxycodone 10mg  and is now on oxycodone 5mg  which is not doing anything for his pain.  Also states he has been haivng some bleeding from his stump that started today and now it feels swollen.  He denies fever, purulent drainage, etc.  He reports he does not have upcoming appts with Dr. Sharol Given scheduled.  Home Medications Prior to Admission medications   Medication Sig Start Date End Date Taking? Authorizing Provider  albuterol (VENTOLIN HFA) 108 (90 Base) MCG/ACT inhaler Inhale 2 puffs into the lungs every 6 (six) hours as needed for wheezing or shortness of breath. 05/28/22   Mayers, Cari S, PA-C  atorvastatin (LIPITOR) 40 MG tablet Take 1 tablet (40 mg total) by mouth daily. Patient not taking: Reported on 11/22/2022 05/28/22   Mayers, Cari S, PA-C  bictegravir-emtricitabine-tenofovir AF (BIKTARVY) 50-200-25 MG TABS tablet Take 1 tablet by mouth daily. 12/03/22   Geradine Girt, DO  feeding supplement (ENSURE ENLIVE / ENSURE PLUS) LIQD Take 237 mLs by mouth 2 (two) times daily between meals. Patient not taking: Reported on 11/22/2022 11/09/22   Dana Allan I, MD  oxyCODONE-acetaminophen (PERCOCET/ROXICET) 5-325 MG tablet Take 1 tablet by mouth every 4 (four) hours as needed for severe pain. 12/09/22   Newt Minion, MD  pregabalin (LYRICA) 25 MG capsule Take 1 capsule (25 mg  total) by mouth daily. 12/04/22   Geradine Girt, DO  sulfamethoxazole-trimethoprim (BACTRIM DS) 800-160 MG tablet Take 1 tablet by mouth daily. 12/03/22   Geradine Girt, DO  Zinc Sulfate 220 (50 Zn) MG TABS Take 1 tablet (220 mg total) by mouth daily. 12/04/22   Geradine Girt, DO      Allergies    Patient has no known allergies.    Review of Systems   Review of Systems  Skin:  Positive for wound.  All other systems reviewed and are negative.   Physical Exam Updated Vital Signs BP (!) 132/97 (BP Location: Right Arm)   Pulse (!) 101   Temp 98.4 F (36.9 C) (Oral)   Resp (!) 22   Ht 5\' 10"  (1.778 m)   Wt 68 kg   SpO2 100%   BMI 21.51 kg/m   Physical Exam Vitals and nursing note reviewed.  Constitutional:      Appearance: He is well-developed.  HENT:     Head: Normocephalic and atraumatic.  Eyes:     Conjunctiva/sclera: Conjunctivae normal.     Pupils: Pupils are equal, round, and reactive to light.  Cardiovascular:     Rate and Rhythm: Normal rate and regular rhythm.     Heart sounds: Normal heart sounds.  Pulmonary:     Effort: Pulmonary effort is normal. No respiratory distress.     Breath sounds: Normal breath sounds. No rhonchi.  Abdominal:     Tenderness: There  is no abdominal tenderness.  Musculoskeletal:        General: Normal range of motion.     Cervical back: Normal range of motion.     Comments: Right BKA, some mild edema throughout stump without overlying skin changes or erythema, there is no tissue crepitus, staples remain intact, there does appear to be some bleeding from central portion of incision, there is no pulsatile bleeding  Skin:    General: Skin is warm and dry.  Neurological:     Mental Status: He is alert and oriented to person, place, and time.      ED Results / Procedures / Treatments   Labs (all labs ordered are listed, but only abnormal results are displayed) Labs Reviewed  CBC WITH DIFFERENTIAL/PLATELET  COMPREHENSIVE  METABOLIC PANEL    EKG None  Radiology DG Tibia/Fibula Right  Result Date: 12/16/2022 CLINICAL DATA:  Status post BKA. Bleeding and pain. Worsening swelling. EXAM: RIGHT TIBIA AND FIBULA - 2 VIEW COMPARISON:  None Available. FINDINGS: Right BKA performed 11/27/2022. Mild swelling about the amputation stump. Cutaneous staples. No acute osseous abnormality. IMPRESSION: Nonspecific swelling about the stump of the right BKA. No acute osseous abnormality. Electronically Signed   By: Placido Sou M.D.   On: 12/16/2022 21:53    Procedures Procedures  {Document cardiac monitor, telemetry assessment procedure when appropriate:1}  Medications Ordered in ED Medications  oxyCODONE (Oxy IR/ROXICODONE) immediate release tablet 10 mg (has no administration in time range)    ED Course/ Medical Decision Making/ A&P   {   Click here for ABCD2, HEART and other calculatorsREFRESH Note before signing :1}                          Medical Decision Making Amount and/or Complexity of Data Reviewed Labs: ordered.   57 year old male here with postop problem.  Had right BKA on 11/27/2022 with Dr. Sharol Given.  States today started having some bleeding and has noticed some swelling of the stump.  He denies any fever or purulent drainage.  He is nontoxic in appearance here.  Incision overall appears clean, staples intact.  He does have scant amount of bleeding from central portion of wound but this is easily controlled with gauze and pressure bandage.  He does not have any tissue crepitus, overlying erythema, induration, or other physical exam findings concerning for infection.  Labs were obtained, no leukocytosis, no electrolyte derangement.  X-ray with soft tissue edema but no soft tissue gas or other bony findings.  11:11 PM Spoke with Dr. Laurance Flatten on call for Dr. Sharol Given-- recommends to obtain CRP and ESR for Dr. Sharol Given to follow-up but does not need to wait here for results, call office in the morning and can squeeze him  into the clinic for follow-up.  Final Clinical Impression(s) / ED Diagnoses Final diagnoses:  None    Rx / DC Orders ED Discharge Orders     None

## 2022-12-16 NOTE — ED Notes (Signed)
Placed bandage on wound seems to have controled bleeding

## 2022-12-16 NOTE — ED Triage Notes (Signed)
Pt had right lower leg amputation 01/10 states noticed bleeding this afternoon, c/o pain to incision.

## 2022-12-16 NOTE — ED Provider Triage Note (Signed)
Emergency Medicine Provider Triage Evaluation Note  Curtis Hodge , a 57 y.o. male  was evaluated in triage.  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  Presents for evaluation of right lower extremity swelling, bleeding.  Patient has had increasing pain and swelling over the past several days and bleeding through bandaging starting today.  Taking Percocet for pain.  Review of Systems  Positive: Lower extremity swelling and pain, bleeding Negative: Fever  Physical Exam  BP (!) 132/97 (BP Location: Right Arm)   Pulse (!) 101   Temp 98.4 F (36.9 C) (Oral)   Resp (!) 22   Ht 5\' 10"  (1.778 m)   Wt 68 kg   SpO2 100%   BMI 21.51 kg/m  Gen:   Awake, no distress   Resp:  Normal effort  MSK:   Moves extremities without difficulty  Other:  Generalized edema of the right lower leg stump, bleedthrough noted of the bandaging that is in place, patient very tender to palpation.  Medical Decision Making  Medically screening exam initiated at 9:30 PM.  Appropriate orders placed.  Curtis Hodge was informed that the remainder of the evaluation will be completed by another provider, this initial triage assessment does not replace that evaluation, and the importance of remaining in the ED until their evaluation is complete.     Carlisle Cater, PA-C 12/16/22 [id number]

## 2022-12-18 ENCOUNTER — Encounter: Payer: Self-pay | Admitting: Family

## 2022-12-18 ENCOUNTER — Other Ambulatory Visit: Payer: Self-pay

## 2022-12-18 ENCOUNTER — Ambulatory Visit (INDEPENDENT_AMBULATORY_CARE_PROVIDER_SITE_OTHER): Payer: Medicaid Other | Admitting: Family

## 2022-12-18 DIAGNOSIS — S88111A Complete traumatic amputation at level between knee and ankle, right lower leg, initial encounter: Secondary | ICD-10-CM

## 2022-12-18 DIAGNOSIS — Z89511 Acquired absence of right leg below knee: Secondary | ICD-10-CM

## 2022-12-18 MED ORDER — OXYCODONE-ACETAMINOPHEN 5-325 MG PO TABS
1.0000 | ORAL_TABLET | ORAL | 0 refills | Status: DC | PRN
  Filled 2022-12-18: qty 42, 7d supply, fill #0
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
## 2022-12-19 ENCOUNTER — Inpatient Hospital Stay (INDEPENDENT_AMBULATORY_CARE_PROVIDER_SITE_OTHER): Payer: Medicaid Other | Admitting: Primary Care

## 2022-12-27 ENCOUNTER — Encounter: Payer: Medicaid Other | Admitting: Family

## 2022-12-30 ENCOUNTER — Ambulatory Visit: Payer: Medicaid Other | Attending: Orthopedic Surgery

## 2023-01-06 ENCOUNTER — Telehealth: Payer: Self-pay | Admitting: Orthopedic Surgery

## 2023-01-06 NOTE — Telephone Encounter (Signed)
Can you please call and make an appt for sometime next week with Erin or Dr. Sharol Given please

## 2023-01-06 NOTE — Telephone Encounter (Signed)
Patient called. Says he needs an appointment with Dr. Sharol Given or Junie Panning this week. He was out of town last week and could not make it to his appointment. His call back number is [phone]

## 2023-01-15 ENCOUNTER — Encounter: Payer: Medicaid Other | Admitting: Family

## 2023-01-22 ENCOUNTER — Ambulatory Visit (INDEPENDENT_AMBULATORY_CARE_PROVIDER_SITE_OTHER): Payer: Medicaid Other | Admitting: Family

## 2023-01-22 ENCOUNTER — Encounter: Payer: Self-pay | Admitting: Family

## 2023-01-22 ENCOUNTER — Ambulatory Visit: Payer: Medicaid Other

## 2023-01-22 ENCOUNTER — Other Ambulatory Visit: Payer: Self-pay

## 2023-01-22 ENCOUNTER — Telehealth: Payer: Self-pay | Admitting: Orthopedic Surgery

## 2023-01-22 DIAGNOSIS — Z89511 Acquired absence of right leg below knee: Secondary | ICD-10-CM

## 2023-01-22 DIAGNOSIS — S88111A Complete traumatic amputation at level between knee and ankle, right lower leg, initial encounter: Secondary | ICD-10-CM

## 2023-01-22 MED ORDER — OXYCODONE-ACETAMINOPHEN 5-325 MG PO TABS
1.0000 | ORAL_TABLET | Freq: Four times a day (QID) | ORAL | 0 refills | Status: DC | PRN
  Filled 2023-01-22: qty 30, 7d supply, fill #0
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
## 2023-01-30 ENCOUNTER — Other Ambulatory Visit: Payer: Self-pay | Admitting: Orthopedic Surgery

## 2023-01-30 ENCOUNTER — Other Ambulatory Visit: Payer: Self-pay

## 2023-01-30 ENCOUNTER — Telehealth: Payer: Self-pay | Admitting: Family

## 2023-01-30 MED ORDER — OXYCODONE-ACETAMINOPHEN 5-325 MG PO TABS
1.0000 | ORAL_TABLET | Freq: Four times a day (QID) | ORAL | 0 refills | Status: DC | PRN
  Filled 2023-01-30: qty 30, 8d supply, fill #0

## 2023-01-30 NOTE — Telephone Encounter (Signed)
S/p right BKA 11/27/22 Oxycodone filled 01/22/23 #30

## 2023-01-30 NOTE — Telephone Encounter (Signed)
Called pt advised on VM Dr. Sharol Given sent in pain med refill

## 2023-01-30 NOTE — Telephone Encounter (Signed)
Patient wants to get refill on oxycodone

## 2023-01-31 ENCOUNTER — Other Ambulatory Visit: Payer: Self-pay

## 2023-02-10 ENCOUNTER — Other Ambulatory Visit: Payer: Self-pay

## 2023-02-10 ENCOUNTER — Other Ambulatory Visit: Payer: Self-pay | Admitting: Orthopedic Surgery

## 2023-02-10 ENCOUNTER — Telehealth: Payer: Self-pay | Admitting: Orthopedic Surgery

## 2023-02-10 MED ORDER — OXYCODONE-ACETAMINOPHEN 5-325 MG PO TABS
1.0000 | ORAL_TABLET | Freq: Four times a day (QID) | ORAL | 0 refills | Status: DC | PRN
  Filled 2023-02-10: qty 30, 7d supply, fill #0

## 2023-02-10 NOTE — Telephone Encounter (Signed)
Lm on VM that medication has been sent to pharmacy

## 2023-02-10 NOTE — Telephone Encounter (Signed)
Pt s/p right BKA 11/27/22 and asking for refill on Oxycodone 3/25. Last refill 01/30/2023 #30 please advise.

## 2023-02-10 NOTE — Telephone Encounter (Signed)
Pt called requesting refill of pain medication. Please send to [facility]. Pt phone number is [phone]. Pt is asking for a call when medication has been sent due to him using medical transportation

## 2023-02-13 ENCOUNTER — Other Ambulatory Visit (HOSPITAL_COMMUNITY): Payer: Self-pay

## 2023-02-13 ENCOUNTER — Other Ambulatory Visit: Payer: Self-pay

## 2023-02-13 ENCOUNTER — Ambulatory Visit (INDEPENDENT_AMBULATORY_CARE_PROVIDER_SITE_OTHER): Payer: Medicaid Other

## 2023-02-13 ENCOUNTER — Other Ambulatory Visit: Payer: Self-pay | Admitting: Pharmacist

## 2023-02-13 ENCOUNTER — Ambulatory Visit (INDEPENDENT_AMBULATORY_CARE_PROVIDER_SITE_OTHER): Payer: Medicaid Other | Admitting: Infectious Disease

## 2023-02-13 ENCOUNTER — Encounter: Payer: Self-pay | Admitting: Infectious Disease

## 2023-02-13 VITALS — BP 148/79 | HR 95 | Temp 98.2°F

## 2023-02-13 DIAGNOSIS — F17200 Nicotine dependence, unspecified, uncomplicated: Secondary | ICD-10-CM

## 2023-02-13 DIAGNOSIS — J439 Emphysema, unspecified: Secondary | ICD-10-CM | POA: Diagnosis not present

## 2023-02-13 DIAGNOSIS — B181 Chronic viral hepatitis B without delta-agent: Secondary | ICD-10-CM | POA: Diagnosis not present

## 2023-02-13 DIAGNOSIS — F17210 Nicotine dependence, cigarettes, uncomplicated: Secondary | ICD-10-CM | POA: Diagnosis not present

## 2023-02-13 DIAGNOSIS — Z9189 Other specified personal risk factors, not elsewhere classified: Secondary | ICD-10-CM | POA: Diagnosis not present

## 2023-02-13 DIAGNOSIS — Z7185 Encounter for immunization safety counseling: Secondary | ICD-10-CM | POA: Diagnosis not present

## 2023-02-13 DIAGNOSIS — Z599 Problem related to housing and economic circumstances, unspecified: Secondary | ICD-10-CM | POA: Diagnosis not present

## 2023-02-13 DIAGNOSIS — Z23 Encounter for immunization: Secondary | ICD-10-CM | POA: Diagnosis present

## 2023-02-13 DIAGNOSIS — E785 Hyperlipidemia, unspecified: Secondary | ICD-10-CM

## 2023-02-13 DIAGNOSIS — B20 Human immunodeficiency virus [HIV] disease: Secondary | ICD-10-CM

## 2023-02-13 DIAGNOSIS — F4310 Post-traumatic stress disorder, unspecified: Secondary | ICD-10-CM

## 2023-02-13 DIAGNOSIS — Z89511 Acquired absence of right leg below knee: Secondary | ICD-10-CM | POA: Diagnosis not present

## 2023-02-13 DIAGNOSIS — Z113 Encounter for screening for infections with a predominantly sexual mode of transmission: Secondary | ICD-10-CM

## 2023-02-13 HISTORY — DX: Problem related to housing and economic circumstances, unspecified: Z59.9

## 2023-02-13 MED ORDER — BIKTARVY 50-200-25 MG PO TABS
1.0000 | ORAL_TABLET | Freq: Every day | ORAL | 11 refills | Status: DC
  Filled 2023-02-13 – 2023-02-26 (×2): qty 30, 30d supply, fill #0

## 2023-02-13 MED ORDER — BICTEGRAVIR-EMTRICITAB-TENOFOV 50-200-25 MG PO TABS: 1.0000 | ORAL_TABLET | Freq: Every day | ORAL | 0 refills | Status: DC

## 2023-02-13 NOTE — Progress Notes (Signed)
Medication Samples have been provided to the patient.  Drug name: Biktarvy        Strength: 50/200/25 mg       Qty: 28 tablets (4 bottles) LOT: [id number]   Exp.Date: 8/26  Dosing instructions: Take one tablet by mouth once daily  The patient has been instructed regarding the correct time, dose, and frequency of taking this medication, including desired effects and most common side effects.   Alfonse Spruce, PharmD, CPP, BCIDP, [place] Clinical Pharmacist Practitioner [facility]
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
## 2023-02-17 ENCOUNTER — Other Ambulatory Visit: Payer: Self-pay | Admitting: Infectious Disease

## 2023-02-18 ENCOUNTER — Telehealth: Payer: Self-pay

## 2023-02-18 NOTE — Telephone Encounter (Signed)
Community message also sent to [facility] for additional contact information  Eugenia Mcalpine, LPN

## 2023-02-18 NOTE — Telephone Encounter (Signed)
-----   Message from Truman Hayward, MD sent at 02/17/2023  5:10 PM EDT ----- Regarding: clearly NOT taking his medications YET again, when is he coming back and can we investigate more what is going on  ----- Message ----- From: Interface, [facility] Lab Results In Sent: 02/13/2023  11:25 PM EDT To: Truman Hayward, MD

## 2023-02-18 NOTE — Telephone Encounter (Signed)
Detectable Viral Load Intervention   Most recent VL:  HIV 1 RNA Quant  Date Value Ref Range Status  02/13/2023 76,000 (H) copies/mL Final  11/24/2022 13,300 copies/mL Corrected    Comment:    (NOTE) The reportable range for this assay is 20 to 10,000,000 copies HIV-1 RNA/mL.   06/06/2022 360 (H) Copies/mL Final    Current ART regimen: Biktarvy  Appointment status: patient has future appointment scheduled  Called patient to discuss medication adherence and possible barriers to care.   Medication last dispensed (per chart review): 11/29/2022  Staff unable to reach patient at this time to identify any medication barriers.   Staff will also reach out via [place].  Eugenia Mcalpine, LPN

## 2023-02-24 ENCOUNTER — Other Ambulatory Visit: Payer: Self-pay

## 2023-02-24 ENCOUNTER — Telehealth: Payer: Self-pay | Admitting: Family

## 2023-02-24 ENCOUNTER — Other Ambulatory Visit: Payer: Self-pay | Admitting: Orthopedic Surgery

## 2023-02-24 MED ORDER — OXYCODONE-ACETAMINOPHEN 5-325 MG PO TABS
1.0000 | ORAL_TABLET | Freq: Four times a day (QID) | ORAL | 0 refills | Status: DC | PRN
  Filled 2023-02-24: qty 20, 5d supply, fill #0

## 2023-02-24 NOTE — Telephone Encounter (Signed)
Patient called needing Rx refilled Oxycodone. Patient said he do not have a phone for contacting him.

## 2023-02-24 NOTE — Telephone Encounter (Signed)
S/p 11/27/22 right BKA Oxycodone last filled 02/10/23 #30

## 2023-02-26 ENCOUNTER — Other Ambulatory Visit: Payer: Self-pay

## 2023-02-26 ENCOUNTER — Other Ambulatory Visit (HOSPITAL_COMMUNITY): Payer: Self-pay

## 2023-02-26 ENCOUNTER — Telehealth: Payer: Self-pay

## 2023-02-26 ENCOUNTER — Telehealth: Payer: Self-pay | Admitting: Orthopedic Surgery

## 2023-02-26 ENCOUNTER — Ambulatory Visit: Payer: Medicaid Other | Attending: Physician Assistant | Admitting: Physician Assistant

## 2023-02-26 ENCOUNTER — Encounter: Payer: Self-pay | Admitting: Physician Assistant

## 2023-02-26 VITALS — BP 170/91 | HR 85

## 2023-02-26 DIAGNOSIS — J439 Emphysema, unspecified: Secondary | ICD-10-CM | POA: Diagnosis not present

## 2023-02-26 DIAGNOSIS — Z713 Dietary counseling and surveillance: Secondary | ICD-10-CM | POA: Diagnosis not present

## 2023-02-26 DIAGNOSIS — R030 Elevated blood-pressure reading, without diagnosis of hypertension: Secondary | ICD-10-CM | POA: Diagnosis not present

## 2023-02-26 DIAGNOSIS — Z760 Encounter for issue of repeat prescription: Secondary | ICD-10-CM | POA: Diagnosis not present

## 2023-02-26 DIAGNOSIS — Z79899 Other long term (current) drug therapy: Secondary | ICD-10-CM | POA: Diagnosis not present

## 2023-02-26 DIAGNOSIS — B20 Human immunodeficiency virus [HIV] disease: Secondary | ICD-10-CM | POA: Insufficient documentation

## 2023-02-26 DIAGNOSIS — E785 Hyperlipidemia, unspecified: Secondary | ICD-10-CM | POA: Insufficient documentation

## 2023-02-26 MED ORDER — ALBUTEROL SULFATE HFA 108 (90 BASE) MCG/ACT IN AERS
2.0000 | INHALATION_SPRAY | Freq: Four times a day (QID) | RESPIRATORY_TRACT | 1 refills | Status: AC | PRN
  Filled 2023-02-26 – 2023-05-08 (×3): qty 18, 25d supply, fill #0

## 2023-02-26 MED ORDER — TIOTROPIUM BROMIDE MONOHYDRATE 18 MCG IN CAPS
18.0000 ug | ORAL_CAPSULE | Freq: Every day | RESPIRATORY_TRACT | 12 refills | Status: DC
Start: 2023-02-26 — End: 2023-05-16
  Filled 2023-02-26 – 2023-05-08 (×2): qty 30, 30d supply, fill #0

## 2023-02-26 MED ORDER — ATORVASTATIN CALCIUM 40 MG PO TABS
40.0000 mg | ORAL_TABLET | Freq: Every day | ORAL | 1 refills | Status: AC
  Filled 2023-02-26 – 2023-05-08 (×2): qty 30, 30d supply, fill #0

## 2023-02-26 MED ORDER — SULFAMETHOXAZOLE-TRIMETHOPRIM 800-160 MG PO TABS
1.0000 | ORAL_TABLET | Freq: Every day | ORAL | 1 refills | Status: DC
  Filled 2023-02-26 – 2023-05-08 (×2): qty 30, 30d supply, fill #0

## 2023-02-26 MED ORDER — BIKTARVY 50-200-25 MG PO TABS
1.0000 | ORAL_TABLET | Freq: Every day | ORAL | 11 refills | Status: DC
Start: 2023-02-26 — End: 2023-09-19
  Filled 2023-02-26 – 2023-03-19 (×2): qty 30, 30d supply, fill #0
  Filled 2023-05-08 – 2023-05-21 (×2): qty 30, 30d supply, fill #1
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
## 2023-02-26 NOTE — Telephone Encounter (Signed)
I called the number in the pt's chart there was not one in message. We can not refill medication. I was not able to reach the pt to advise. I will hold and try again.
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
## 2023-02-26 NOTE — Patient Instructions (Signed)
PLEASE call Dr Lajoyce Corners for the oxycodone

## 2023-02-26 NOTE — Telephone Encounter (Signed)
Unable to reach and unable to refill
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
## 2023-02-27 ENCOUNTER — Telehealth: Payer: Self-pay

## 2023-02-27 NOTE — Telephone Encounter (Signed)
[facility] Patient Advocate Encounter  Patient's medications have been couriered to [facility] from [facility] and will be picked up 03/04/23.  Biktarvy  Clearance Coots , CPhT Specialty Pharmacy Patient [facility] Phone: [phone] Fax:  [phone]

## 2023-03-01 LAB — COMPLETE METABOLIC PANEL WITH GFR
AG Ratio: 1 (calc) (ref 1.0–2.5)
ALT: 51 U/L — ABNORMAL HIGH (ref 9–46)
AST: 37 U/L — ABNORMAL HIGH (ref 10–35)
Albumin: 3.6 g/dL (ref 3.6–5.1)
Alkaline phosphatase (APISO): 95 U/L (ref 35–144)
BUN: 10 mg/dL (ref 7–25)
CO2: 27 mmol/L (ref 20–32)
Calcium: 8.9 mg/dL (ref 8.6–10.3)
Chloride: 107 mmol/L (ref 98–110)
Creat: 1.04 mg/dL (ref 0.70–1.30)
Globulin: 3.7 g/dL (calc) (ref 1.9–3.7)
Glucose, Bld: 91 mg/dL (ref 65–99)
Potassium: 4.1 mmol/L (ref 3.5–5.3)
Sodium: 140 mmol/L (ref 135–146)
Total Bilirubin: 0.3 mg/dL (ref 0.2–1.2)
Total Protein: 7.3 g/dL (ref 6.1–8.1)
eGFR: 84 mL/min/{1.73_m2} (ref >=60)

## 2023-03-01 LAB — HIV-1 INTEGRASE GENOTYPE

## 2023-03-01 LAB — CBC WITH DIFFERENTIAL/PLATELET
Absolute Monocytes: 240 cells/uL (ref 200–950)
Basophils Absolute: 10 cells/uL (ref 0–200)
Basophils Relative: 0.4 %
Eosinophils Absolute: 20 cells/uL (ref 15–500)
Eosinophils Relative: 0.8 %
HCT: 37.9 % — ABNORMAL LOW (ref 38.5–50.0)
Hemoglobin: 13.1 g/dL — ABNORMAL LOW (ref 13.2–17.1)
Lymphs Abs: 1100 cells/uL (ref 850–3900)
MCH: 32.1 pg (ref 27.0–33.0)
MCHC: 34.6 g/dL (ref 32.0–36.0)
MCV: 92.9 fL (ref 80.0–100.0)
MPV: 11.2 fL (ref 7.5–12.5)
Monocytes Relative: 9.6 %
Neutro Abs: 1130 cells/uL — ABNORMAL LOW (ref 1500–7800)
Neutrophils Relative %: 45.2 %
Platelets: 156 10*3/uL (ref 140–400)
RBC: 4.08 10*6/uL — ABNORMAL LOW (ref 4.20–5.80)
RDW: 12.1 % (ref 11.0–15.0)
Total Lymphocyte: 44 %
WBC: 2.5 10*3/uL — ABNORMAL LOW (ref 3.8–10.8)

## 2023-03-01 LAB — LIPID PANEL
Cholesterol: 147 mg/dL (ref <200)
HDL: 46 mg/dL (ref >=40)
LDL Cholesterol (Calc): 85 mg/dL (calc)
Non-HDL Cholesterol (Calc): 101 mg/dL (calc) (ref <130)
Total CHOL/HDL Ratio: 3.2 (calc) (ref <5.0)
Triglycerides: 69 mg/dL (ref <150)

## 2023-03-01 LAB — T-HELPER CELLS (CD4) COUNT (NOT AT ARMC)
Absolute CD4: 195 cells/uL — ABNORMAL LOW (ref 490–1740)
CD4 T Helper %: 18 % — ABNORMAL LOW (ref 30–61)
Total lymphocyte count: 1083 cells/uL (ref 850–3900)

## 2023-03-01 LAB — HIV RNA, RTPCR W/R GT (RTI, PI,INT)
HIV 1 RNA Quant: 76000 copies/mL — ABNORMAL HIGH
HIV-1 RNA Quant, Log: 4.88 Log copies/mL — ABNORMAL HIGH

## 2023-03-01 LAB — RPR: RPR Ser Ql: NONREACTIVE

## 2023-03-01 LAB — HIV-1 GENOTYPE: HIV-1 Genotype: DETECTED — AB

## 2023-03-04 ENCOUNTER — Other Ambulatory Visit: Payer: Self-pay

## 2023-03-04 ENCOUNTER — Ambulatory Visit: Payer: Medicaid Other | Admitting: Infectious Disease

## 2023-03-05 ENCOUNTER — Telehealth: Payer: Self-pay | Admitting: Orthopedic Surgery

## 2023-03-05 ENCOUNTER — Other Ambulatory Visit: Payer: Self-pay

## 2023-03-05 MED ORDER — OXYCODONE-ACETAMINOPHEN 5-325 MG PO TABS
1.0000 | ORAL_TABLET | Freq: Three times a day (TID) | ORAL | 0 refills | Status: DC | PRN
  Filled 2023-03-05: qty 21, 7d supply, fill #0

## 2023-03-05 NOTE — Telephone Encounter (Signed)
Patient called. He would like some pain medication called in for him. I advised that Dr. Lajoyce Corners is not refilling the hydrocodone at this time. He says he needs something. His call back number is [phone]
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
## 2023-03-12 ENCOUNTER — Ambulatory Visit: Payer: Medicaid Other | Admitting: Physician Assistant

## 2023-03-14 ENCOUNTER — Telehealth: Payer: Self-pay | Admitting: Orthopedic Surgery

## 2023-03-14 ENCOUNTER — Other Ambulatory Visit: Payer: Self-pay | Admitting: Family

## 2023-03-14 ENCOUNTER — Telehealth: Payer: Self-pay | Admitting: Family

## 2023-03-14 ENCOUNTER — Other Ambulatory Visit: Payer: Self-pay

## 2023-03-14 NOTE — Telephone Encounter (Signed)
Pt called requesting refill of pain medication. Pt is asking if meds can be sent this morning due to his transportation. Please send by 10 am he is asking. Please send to pharmacy on file. Pt phone number is [phone] before 10 am.

## 2023-03-14 NOTE — Telephone Encounter (Signed)
Pt is scheduled with Erin next Wednesday, will close out this message.

## 2023-03-14 NOTE — Telephone Encounter (Signed)
Pt called requesting first thing Monday morning to discuss his medication. Please call pt at last phone number on last message

## 2023-03-14 NOTE — Telephone Encounter (Signed)
Lmtcb, told him on VM that I can put him down at 8:30 am this Monday to see Dr. Lajoyce Corners. He is to call back and confirm.

## 2023-03-14 NOTE — Telephone Encounter (Signed)
Oxycodone last filled 03/05/23 #21

## 2023-03-19 ENCOUNTER — Telehealth: Payer: Self-pay | Admitting: Family

## 2023-03-19 ENCOUNTER — Other Ambulatory Visit: Payer: Self-pay

## 2023-03-19 ENCOUNTER — Other Ambulatory Visit (HOSPITAL_COMMUNITY): Payer: Self-pay

## 2023-03-19 ENCOUNTER — Ambulatory Visit: Payer: Medicaid Other | Admitting: Family

## 2023-03-19 DIAGNOSIS — S88111D Complete traumatic amputation at level between knee and ankle, right lower leg, subsequent encounter: Secondary | ICD-10-CM

## 2023-03-19 NOTE — Addendum Note (Signed)
Addended by: Barnie Del R on: 03/19/2023 03:29 PM   Modules accepted: Orders

## 2023-03-19 NOTE — Telephone Encounter (Signed)
Patient would like to know if he can be worked in time this week he stated he could not make it to his appt being he was the only person with a house key and he could not leave or no one would be able to get in while he was gone. He would like a refill on his pain medication and a referral to PT for rehab please advise stated to call the number given  [phone]

## 2023-03-19 NOTE — Telephone Encounter (Signed)
Worked patient in to your schedule for Friday morning. He has appt at 2pm to get prosthetic at [facility]. Advised that we cannot refill medication until seen in the office.

## 2023-03-21 ENCOUNTER — Encounter: Payer: Medicaid Other | Admitting: Family

## 2023-03-21 ENCOUNTER — Other Ambulatory Visit (HOSPITAL_COMMUNITY): Payer: Self-pay

## 2023-03-21 ENCOUNTER — Telehealth: Payer: Self-pay | Admitting: Family

## 2023-03-21 DIAGNOSIS — Z89511 Acquired absence of right leg below knee: Secondary | ICD-10-CM | POA: Diagnosis not present

## 2023-03-21 NOTE — Telephone Encounter (Signed)
Has not been seen in about 2 months and had surgery more than 3 months ago.  I do not think he should take any opioid medication and just stick to over-the-counter medications for pain control.

## 2023-03-21 NOTE — Telephone Encounter (Signed)
Patient called. Says he needs some pain medication called in. He was in [place] all morning. He can be reached at [phone] for now.

## 2023-03-21 NOTE — Telephone Encounter (Signed)
Attempted to call pt to follow up in 1-2 weeks with a provider for re-engaging care.

## 2023-03-24 ENCOUNTER — Ambulatory Visit (INDEPENDENT_AMBULATORY_CARE_PROVIDER_SITE_OTHER): Payer: 59 | Admitting: Orthopedic Surgery

## 2023-03-24 ENCOUNTER — Other Ambulatory Visit: Payer: Self-pay

## 2023-03-24 DIAGNOSIS — S88111D Complete traumatic amputation at level between knee and ankle, right lower leg, subsequent encounter: Secondary | ICD-10-CM

## 2023-03-24 DIAGNOSIS — Z89511 Acquired absence of right leg below knee: Secondary | ICD-10-CM | POA: Diagnosis not present

## 2023-03-24 MED ORDER — OXYCODONE-ACETAMINOPHEN 5-325 MG PO TABS
1.0000 | ORAL_TABLET | Freq: Three times a day (TID) | ORAL | 0 refills | Status: DC | PRN
  Filled 2023-03-24: qty 21, 7d supply, fill #0
  Filled 2023-03-24: qty 30, 10d supply, fill #0

## 2023-03-24 MED ORDER — GABAPENTIN 100 MG PO CAPS
100.0000 mg | ORAL_CAPSULE | Freq: Three times a day (TID) | ORAL | 3 refills | Status: DC
  Filled 2023-03-24: qty 90, 30d supply, fill #0
  Filled 2023-05-08 (×2): qty 90, 30d supply, fill #1

## 2023-03-24 NOTE — Telephone Encounter (Signed)
Patient saw Dr. Lajoyce Corners today

## 2023-03-25 ENCOUNTER — Ambulatory Visit: Payer: Medicaid Other | Admitting: Family

## 2023-03-26 ENCOUNTER — Encounter: Payer: 59 | Admitting: Family

## 2023-03-31 ENCOUNTER — Telehealth: Payer: Self-pay

## 2023-03-31 DIAGNOSIS — F331 Major depressive disorder, recurrent, moderate: Secondary | ICD-10-CM | POA: Diagnosis not present

## 2023-03-31 NOTE — Telephone Encounter (Signed)
[facility] Patient Advocate Encounter ° °Patient's medications have been couriered to [facility] from [facility] and will be picked up . ° °Annalisia Ingber , CPhT °Specialty Pharmacy Patient Advocate °[facility] °Phone: [phone] °Fax:  [phone]  °

## 2023-04-01 ENCOUNTER — Other Ambulatory Visit: Payer: Self-pay | Admitting: Orthopedic Surgery

## 2023-04-01 ENCOUNTER — Telehealth: Payer: Self-pay | Admitting: Orthopedic Surgery

## 2023-04-01 MED ORDER — OXYCODONE-ACETAMINOPHEN 5-325 MG PO TABS
1.0000 | ORAL_TABLET | Freq: Three times a day (TID) | ORAL | 0 refills | Status: DC | PRN
  Filled 2023-04-01: qty 30, 10d supply, fill #0

## 2023-04-01 NOTE — Telephone Encounter (Signed)
Oxycodone last filled 03/24/23 #30 S/p right BKA 11/27/22

## 2023-04-01 NOTE — Telephone Encounter (Signed)
Patient called needing Rx refilled Oxycodone. Patient advised he can pick the Rx up from the pharmacy tomorrow, The number to contact patient is [phone]

## 2023-04-02 ENCOUNTER — Other Ambulatory Visit: Payer: Self-pay

## 2023-04-07 ENCOUNTER — Other Ambulatory Visit: Payer: Self-pay

## 2023-04-07 NOTE — Progress Notes (Signed)
Patient attempted to be outreached by Bing Plume, PharmD Candidate on 04/07/23 to discuss hypertension. Could not leave a voicemail. Will re-attempt at PM.   Winnifred Friar, Student-PharmD

## 2023-04-09 ENCOUNTER — Ambulatory Visit: Payer: 59 | Admitting: Family Medicine

## 2023-04-09 ENCOUNTER — Telehealth: Payer: Self-pay | Admitting: Orthopedic Surgery

## 2023-04-09 NOTE — Telephone Encounter (Signed)
I called number again and unable to reach pt.

## 2023-04-09 NOTE — Telephone Encounter (Signed)
Patient asking if he can get his Oxycodone 5mg  please advise when done

## 2023-04-09 NOTE — Telephone Encounter (Signed)
I called the pt and there was no answer or vm option. The pt received a refill of pain medication on 04/02/2023 not able to refill at this time. Will hold and try again.

## 2023-04-10 ENCOUNTER — Encounter: Payer: Self-pay | Admitting: Orthopedic Surgery

## 2023-04-10 ENCOUNTER — Other Ambulatory Visit: Payer: Self-pay
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
## 2023-04-11 ENCOUNTER — Other Ambulatory Visit: Payer: Self-pay

## 2023-04-11 ENCOUNTER — Telehealth: Payer: Self-pay | Admitting: Orthopedic Surgery

## 2023-04-11 ENCOUNTER — Other Ambulatory Visit: Payer: Self-pay | Admitting: Orthopedic Surgery

## 2023-04-11 MED ORDER — OXYCODONE-ACETAMINOPHEN 5-325 MG PO TABS
1.0000 | ORAL_TABLET | Freq: Three times a day (TID) | ORAL | 0 refills | Status: DC | PRN
  Filled 2023-04-11: qty 30, 10d supply, fill #0

## 2023-04-11 NOTE — Telephone Encounter (Signed)
PT CALLED STATING DR DUDA TOLD HIM TO CALL IN MEDS EARLY SO HE WONT RUN OUT. PT STATES HE DONT HAVE A PROBLEM WAIT WHEN HE IS ALLOWED TO GO TO PHARMACY AND GET THEM JUST WANT TO MAKE SURE THEY ARE SENT IN. PT PHONE NUMBER IS [phone]

## 2023-04-11 NOTE — Telephone Encounter (Signed)
Faxed refill request from pharmacy approved by Dr. Lajoyce Corners today.

## 2023-04-15 ENCOUNTER — Other Ambulatory Visit (HOSPITAL_COMMUNITY): Payer: Self-pay

## 2023-04-17 ENCOUNTER — Other Ambulatory Visit: Payer: Self-pay | Admitting: Orthopedic Surgery

## 2023-04-17 ENCOUNTER — Other Ambulatory Visit: Payer: Self-pay

## 2023-04-17 ENCOUNTER — Telehealth: Payer: Self-pay | Admitting: Orthopedic Surgery

## 2023-04-17 NOTE — Telephone Encounter (Signed)
Patient asking to have his medication ready to be refilled by Sunday. Please advise

## 2023-04-17 NOTE — Telephone Encounter (Signed)
Pt called about an update on refill of pain medication. Please send to pharmacy on file. Pt phone number is [phone].

## 2023-04-17 NOTE — Telephone Encounter (Signed)
Patient asking for a refill on his Oxycodone 5mg .. please advise

## 2023-04-17 NOTE — Telephone Encounter (Signed)
Pt has already called twice this morning in reference to oxycodone refill.  He is s/p right bka 11/27/22. Last oxycodone refill was 04/11/23 #30 10 d/s.

## 2023-04-17 NOTE — Telephone Encounter (Signed)
Called to SW pt about this, no answer and cannot leave a VM.

## 2023-04-18 ENCOUNTER — Other Ambulatory Visit (HOSPITAL_COMMUNITY): Payer: Self-pay

## 2023-04-18 ENCOUNTER — Encounter: Payer: Self-pay | Admitting: Family

## 2023-04-18 ENCOUNTER — Other Ambulatory Visit: Payer: Self-pay

## 2023-04-18 ENCOUNTER — Ambulatory Visit (INDEPENDENT_AMBULATORY_CARE_PROVIDER_SITE_OTHER): Payer: 59 | Admitting: Family

## 2023-04-18 DIAGNOSIS — Z89511 Acquired absence of right leg below knee: Secondary | ICD-10-CM | POA: Diagnosis not present

## 2023-04-18 DIAGNOSIS — S88111S Complete traumatic amputation at level between knee and ankle, right lower leg, sequela: Secondary | ICD-10-CM

## 2023-04-18 MED ORDER — OXYCODONE-ACETAMINOPHEN 5-325 MG PO TABS
1.0000 | ORAL_TABLET | Freq: Two times a day (BID) | ORAL | 0 refills | Status: DC | PRN
  Filled 2023-04-18 – 2023-04-21 (×3): qty 20, 10d supply, fill #0

## 2023-04-18 MED ORDER — GABAPENTIN 300 MG PO CAPS
300.0000 mg | ORAL_CAPSULE | Freq: Three times a day (TID) | ORAL | 2 refills | Status: AC
  Filled 2023-04-18: qty 90, 30d supply, fill #0
  Filled 2023-05-08: qty 90, 30d supply, fill #1

## 2023-04-18 NOTE — Telephone Encounter (Signed)
Pt called this morning and he is scheduled this morning to see Erin.
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
## 2023-04-21 ENCOUNTER — Other Ambulatory Visit (HOSPITAL_COMMUNITY): Payer: Self-pay

## 2023-04-21 ENCOUNTER — Other Ambulatory Visit: Payer: Self-pay

## 2023-04-28 ENCOUNTER — Other Ambulatory Visit: Payer: Self-pay

## 2023-04-28 ENCOUNTER — Other Ambulatory Visit: Payer: Self-pay | Admitting: Family

## 2023-04-28 ENCOUNTER — Telehealth: Payer: Self-pay | Admitting: Orthopedic Surgery

## 2023-04-28 MED ORDER — OXYCODONE-ACETAMINOPHEN 5-325 MG PO TABS
1.0000 | ORAL_TABLET | Freq: Two times a day (BID) | ORAL | 0 refills | Status: DC | PRN
  Filled 2023-04-28 – 2023-04-30 (×3): qty 20, 10d supply, fill #0

## 2023-04-28 NOTE — Telephone Encounter (Signed)
Patient called needing Rx refilled Oxycodone. The number to contact patient is [phone]

## 2023-04-28 NOTE — Telephone Encounter (Signed)
Pt just received #20 Oxycodone refill on 04/18/2023 do you want to refill?

## 2023-04-29 ENCOUNTER — Other Ambulatory Visit: Payer: Self-pay

## 2023-04-29 ENCOUNTER — Telehealth: Payer: Self-pay | Admitting: Pharmacy Technician

## 2023-04-29 ENCOUNTER — Other Ambulatory Visit (HOSPITAL_COMMUNITY): Payer: Self-pay

## 2023-04-29 NOTE — Telephone Encounter (Signed)
Sent Mychart msg to pt.

## 2023-04-30 ENCOUNTER — Other Ambulatory Visit: Payer: Self-pay

## 2023-05-05 ENCOUNTER — Other Ambulatory Visit: Payer: Self-pay

## 2023-05-06 ENCOUNTER — Telehealth: Payer: Self-pay | Admitting: Orthopedic Surgery

## 2023-05-06 NOTE — Telephone Encounter (Signed)
Patient requesting Oxycodone refill sent to [facility] pharmacy

## 2023-05-06 NOTE — Telephone Encounter (Signed)
Denied. Pt just received #20 04/22/2023. Has appt 05/13/2023 and will discuss at that time.

## 2023-05-08 ENCOUNTER — Other Ambulatory Visit (HOSPITAL_COMMUNITY): Payer: Self-pay

## 2023-05-08 ENCOUNTER — Other Ambulatory Visit: Payer: Self-pay | Admitting: Orthopedic Surgery

## 2023-05-08 ENCOUNTER — Other Ambulatory Visit: Payer: Self-pay

## 2023-05-09 ENCOUNTER — Telehealth: Payer: Self-pay | Admitting: Orthopedic Surgery

## 2023-05-09 ENCOUNTER — Other Ambulatory Visit: Payer: Self-pay

## 2023-05-09 NOTE — Telephone Encounter (Signed)
[name] called requesting his meds again for pain. State his leg is swollen. Explained to pt that Dr Lajoyce Corners is in surgery and per Atumn F note Dr Lajoyce Corners denied his pain pills until upcoming visit. Pt states he is not trying to get medication to soon that the pharmacy on release to him on the days it can be file. Informed pt that if leg is swollen to please go to an Urgent care or nearest hospital. Pt understood and asked to confirm his appt with [facility] 6/25 @ 10am

## 2023-05-09 NOTE — Telephone Encounter (Signed)
Noted  

## 2023-05-12 ENCOUNTER — Other Ambulatory Visit (HOSPITAL_COMMUNITY): Payer: Self-pay

## 2023-05-13 ENCOUNTER — Encounter: Payer: Self-pay | Admitting: Family

## 2023-05-13 ENCOUNTER — Other Ambulatory Visit: Payer: Self-pay

## 2023-05-13 ENCOUNTER — Telehealth: Payer: Self-pay | Admitting: Pharmacist

## 2023-05-13 ENCOUNTER — Ambulatory Visit (INDEPENDENT_AMBULATORY_CARE_PROVIDER_SITE_OTHER): Payer: 59 | Admitting: Family

## 2023-05-13 DIAGNOSIS — Z89511 Acquired absence of right leg below knee: Secondary | ICD-10-CM | POA: Diagnosis not present

## 2023-05-13 DIAGNOSIS — S88111S Complete traumatic amputation at level between knee and ankle, right lower leg, sequela: Secondary | ICD-10-CM

## 2023-05-13 MED ORDER — OXYCODONE-ACETAMINOPHEN 5-325 MG PO TABS
1.0000 | ORAL_TABLET | Freq: Two times a day (BID) | ORAL | 0 refills | Status: DC | PRN
  Filled 2023-05-13: qty 14, 7d supply, fill #0

## 2023-05-13 MED ORDER — CEPHALEXIN 500 MG PO CAPS
500.0000 mg | ORAL_CAPSULE | Freq: Three times a day (TID) | ORAL | 0 refills | Status: DC
  Filled 2023-05-13: qty 30, 10d supply, fill #0
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
## 2023-05-13 NOTE — Telephone Encounter (Signed)
When patient theoretically comes this week to pick up his medications from the clinic, can someone from the front staff or Lupita Leash schedule him with Dr. Daiva Eves whenever he is available?  Thank you!  Margarite Gouge, PharmD, CPP, BCIDP, AAHIVP Clinical Pharmacist Practitioner Infectious Diseases Clinical Pharmacist [facility]

## 2023-05-14 ENCOUNTER — Other Ambulatory Visit: Payer: Self-pay

## 2023-05-15 ENCOUNTER — Inpatient Hospital Stay (HOSPITAL_COMMUNITY)
Admission: EM | Admit: 2023-05-15 | Discharge: 2023-05-26 | DRG: 982 | Disposition: A | Discharge status: Home / Self Care | Payer: 59 | Attending: Family Medicine | Admitting: Family Medicine

## 2023-05-15 ENCOUNTER — Inpatient Hospital Stay (HOSPITAL_COMMUNITY): Payer: 59

## 2023-05-15 ENCOUNTER — Emergency Department (HOSPITAL_COMMUNITY): Payer: 59

## 2023-05-15 ENCOUNTER — Encounter (HOSPITAL_COMMUNITY): Payer: Self-pay

## 2023-05-15 ENCOUNTER — Other Ambulatory Visit: Payer: Self-pay

## 2023-05-15 DIAGNOSIS — K7460 Unspecified cirrhosis of liver: Secondary | ICD-10-CM | POA: Diagnosis present

## 2023-05-15 DIAGNOSIS — L0390 Cellulitis, unspecified: Secondary | ICD-10-CM | POA: Diagnosis not present

## 2023-05-15 DIAGNOSIS — L02419 Cutaneous abscess of limb, unspecified: Secondary | ICD-10-CM | POA: Diagnosis not present

## 2023-05-15 DIAGNOSIS — S80261A Insect bite (nonvenomous), right knee, initial encounter: Secondary | ICD-10-CM | POA: Diagnosis present

## 2023-05-15 DIAGNOSIS — F1210 Cannabis abuse, uncomplicated: Secondary | ICD-10-CM | POA: Diagnosis not present

## 2023-05-15 DIAGNOSIS — M25461 Effusion, right knee: Secondary | ICD-10-CM | POA: Diagnosis not present

## 2023-05-15 DIAGNOSIS — R251 Tremor, unspecified: Secondary | ICD-10-CM | POA: Diagnosis present

## 2023-05-15 DIAGNOSIS — M25561 Pain in right knee: Secondary | ICD-10-CM | POA: Diagnosis not present

## 2023-05-15 DIAGNOSIS — Z89511 Acquired absence of right leg below knee: Secondary | ICD-10-CM

## 2023-05-15 DIAGNOSIS — M71161 Other infective bursitis, right knee: Secondary | ICD-10-CM

## 2023-05-15 DIAGNOSIS — F1910 Other psychoactive substance abuse, uncomplicated: Secondary | ICD-10-CM | POA: Diagnosis not present

## 2023-05-15 DIAGNOSIS — D696 Thrombocytopenia, unspecified: Secondary | ICD-10-CM

## 2023-05-15 DIAGNOSIS — Z89431 Acquired absence of right foot: Secondary | ICD-10-CM

## 2023-05-15 DIAGNOSIS — M85861 Other specified disorders of bone density and structure, right lower leg: Secondary | ICD-10-CM | POA: Diagnosis not present

## 2023-05-15 DIAGNOSIS — R509 Fever, unspecified: Secondary | ICD-10-CM

## 2023-05-15 DIAGNOSIS — Z1152 Encounter for screening for COVID-19: Secondary | ICD-10-CM

## 2023-05-15 DIAGNOSIS — E785 Hyperlipidemia, unspecified: Secondary | ICD-10-CM | POA: Diagnosis present

## 2023-05-15 DIAGNOSIS — R197 Diarrhea, unspecified: Secondary | ICD-10-CM

## 2023-05-15 DIAGNOSIS — R609 Edema, unspecified: Secondary | ICD-10-CM | POA: Diagnosis not present

## 2023-05-15 DIAGNOSIS — A419 Sepsis, unspecified organism: Secondary | ICD-10-CM | POA: Diagnosis present

## 2023-05-15 DIAGNOSIS — M7989 Other specified soft tissue disorders: Secondary | ICD-10-CM | POA: Diagnosis not present

## 2023-05-15 DIAGNOSIS — E871 Hypo-osmolality and hyponatremia: Secondary | ICD-10-CM | POA: Diagnosis not present

## 2023-05-15 DIAGNOSIS — W57XXXA Bitten or stung by nonvenomous insect and other nonvenomous arthropods, initial encounter: Secondary | ICD-10-CM | POA: Diagnosis not present

## 2023-05-15 DIAGNOSIS — Z0389 Encounter for observation for other suspected diseases and conditions ruled out: Secondary | ICD-10-CM | POA: Diagnosis not present

## 2023-05-15 DIAGNOSIS — I10 Essential (primary) hypertension: Secondary | ICD-10-CM | POA: Diagnosis not present

## 2023-05-15 DIAGNOSIS — Z89021 Acquired absence of right finger(s): Secondary | ICD-10-CM

## 2023-05-15 DIAGNOSIS — B9562 Methicillin resistant Staphylococcus aureus infection as the cause of diseases classified elsewhere: Secondary | ICD-10-CM | POA: Diagnosis present

## 2023-05-15 DIAGNOSIS — J439 Emphysema, unspecified: Secondary | ICD-10-CM | POA: Diagnosis not present

## 2023-05-15 DIAGNOSIS — M009 Pyogenic arthritis, unspecified: Secondary | ICD-10-CM | POA: Diagnosis present

## 2023-05-15 DIAGNOSIS — T8743 Infection of amputation stump, right lower extremity: Secondary | ICD-10-CM | POA: Diagnosis not present

## 2023-05-15 DIAGNOSIS — Z91199 Patient's noncompliance with other medical treatment and regimen due to unspecified reason: Secondary | ICD-10-CM

## 2023-05-15 DIAGNOSIS — Y835 Amputation of limb(s) as the cause of abnormal reaction of the patient, or of later complication, without mention of misadventure at the time of the procedure: Secondary | ICD-10-CM | POA: Diagnosis present

## 2023-05-15 DIAGNOSIS — F17220 Nicotine dependence, chewing tobacco, uncomplicated: Secondary | ICD-10-CM | POA: Diagnosis not present

## 2023-05-15 DIAGNOSIS — D6959 Other secondary thrombocytopenia: Secondary | ICD-10-CM | POA: Diagnosis not present

## 2023-05-15 DIAGNOSIS — B20 Human immunodeficiency virus [HIV] disease: Secondary | ICD-10-CM | POA: Diagnosis present

## 2023-05-15 DIAGNOSIS — M869 Osteomyelitis, unspecified: Secondary | ICD-10-CM

## 2023-05-15 DIAGNOSIS — Z743 Need for continuous supervision: Secondary | ICD-10-CM | POA: Diagnosis not present

## 2023-05-15 DIAGNOSIS — R000 Tachycardia, unspecified: Secondary | ICD-10-CM | POA: Diagnosis not present

## 2023-05-15 DIAGNOSIS — M5432 Sciatica, left side: Secondary | ICD-10-CM

## 2023-05-15 DIAGNOSIS — F329 Major depressive disorder, single episode, unspecified: Secondary | ICD-10-CM | POA: Diagnosis present

## 2023-05-15 DIAGNOSIS — T8149XA Infection following a procedure, other surgical site, initial encounter: Secondary | ICD-10-CM | POA: Diagnosis not present

## 2023-05-15 DIAGNOSIS — B181 Chronic viral hepatitis B without delta-agent: Secondary | ICD-10-CM | POA: Diagnosis not present

## 2023-05-15 DIAGNOSIS — L02415 Cutaneous abscess of right lower limb: Secondary | ICD-10-CM | POA: Diagnosis not present

## 2023-05-15 DIAGNOSIS — M7041 Prepatellar bursitis, right knee: Secondary | ICD-10-CM | POA: Diagnosis not present

## 2023-05-15 DIAGNOSIS — D649 Anemia, unspecified: Secondary | ICD-10-CM | POA: Diagnosis present

## 2023-05-15 DIAGNOSIS — Z79899 Other long term (current) drug therapy: Secondary | ICD-10-CM

## 2023-05-15 DIAGNOSIS — F209 Schizophrenia, unspecified: Secondary | ICD-10-CM | POA: Diagnosis not present

## 2023-05-15 DIAGNOSIS — F1410 Cocaine abuse, uncomplicated: Secondary | ICD-10-CM | POA: Diagnosis not present

## 2023-05-15 DIAGNOSIS — L03115 Cellulitis of right lower limb: Secondary | ICD-10-CM | POA: Diagnosis not present

## 2023-05-15 DIAGNOSIS — T63484A Toxic effect of venom of other arthropod, undetermined, initial encounter: Secondary | ICD-10-CM | POA: Diagnosis not present

## 2023-05-15 DIAGNOSIS — Z87442 Personal history of urinary calculi: Secondary | ICD-10-CM

## 2023-05-15 DIAGNOSIS — F17210 Nicotine dependence, cigarettes, uncomplicated: Secondary | ICD-10-CM | POA: Diagnosis present

## 2023-05-15 DIAGNOSIS — L03116 Cellulitis of left lower limb: Secondary | ICD-10-CM | POA: Diagnosis not present

## 2023-05-15 DIAGNOSIS — R600 Localized edema: Secondary | ICD-10-CM | POA: Diagnosis not present

## 2023-05-15 DIAGNOSIS — B1910 Unspecified viral hepatitis B without hepatic coma: Secondary | ICD-10-CM | POA: Diagnosis not present

## 2023-05-15 LAB — URINALYSIS, W/ REFLEX TO CULTURE (INFECTION SUSPECTED)
Bacteria, UA: NONE SEEN
Bilirubin Urine: NEGATIVE
Glucose, UA: NEGATIVE mg/dL
Hgb urine dipstick: NEGATIVE
Ketones, ur: NEGATIVE mg/dL
Leukocytes,Ua: NEGATIVE
Nitrite: NEGATIVE
Protein, ur: NEGATIVE mg/dL
Specific Gravity, Urine: 1.012 (ref 1.005–1.030)
pH: 7 (ref 5.0–8.0)

## 2023-05-15 LAB — CBC WITH DIFFERENTIAL/PLATELET
Abs Immature Granulocytes: 0.03 10*3/uL (ref 0.00–0.07)
Basophils Absolute: 0 10*3/uL (ref 0.0–0.1)
Basophils Relative: 0 %
Eosinophils Absolute: 0 10*3/uL (ref 0.0–0.5)
Eosinophils Relative: 0 %
HCT: 37.7 % — ABNORMAL LOW (ref 39.0–52.0)
Hemoglobin: 12.5 g/dL — ABNORMAL LOW (ref 13.0–17.0)
Immature Granulocytes: 0 %
Lymphocytes Relative: 11 %
Lymphs Abs: 0.9 10*3/uL (ref 0.7–4.0)
MCH: 32.2 pg (ref 26.0–34.0)
MCHC: 33.2 g/dL (ref 30.0–36.0)
MCV: 97.2 fL (ref 80.0–100.0)
Monocytes Absolute: 0.5 10*3/uL (ref 0.1–1.0)
Monocytes Relative: 7 %
Neutro Abs: 6.4 10*3/uL (ref 1.7–7.7)
Neutrophils Relative %: 82 %
Platelets: 101 10*3/uL — ABNORMAL LOW (ref 150–400)
RBC: 3.88 MIL/uL — ABNORMAL LOW (ref 4.22–5.81)
RDW: 13.1 % (ref 11.5–15.5)
WBC: 7.9 10*3/uL (ref 4.0–10.5)
nRBC: 0 % (ref 0.0–0.2)

## 2023-05-15 LAB — CULTURE, BLOOD (ROUTINE X 2)

## 2023-05-15 LAB — PROTIME-INR
INR: 1.2 (ref 0.8–1.2)
Prothrombin Time: 15.4 seconds — ABNORMAL HIGH (ref 11.4–15.2)

## 2023-05-15 LAB — COMPREHENSIVE METABOLIC PANEL
ALT: 68 U/L — ABNORMAL HIGH (ref 0–44)
AST: 71 U/L — ABNORMAL HIGH (ref 15–41)
Albumin: 3 g/dL — ABNORMAL LOW (ref 3.5–5.0)
Alkaline Phosphatase: 88 U/L (ref 38–126)
Anion gap: 6 (ref 5–15)
BUN: 10 mg/dL (ref 6–20)
CO2: 25 mmol/L (ref 22–32)
Calcium: 8.2 mg/dL — ABNORMAL LOW (ref 8.9–10.3)
Chloride: 101 mmol/L (ref 98–111)
Creatinine, Ser: 0.97 mg/dL (ref 0.61–1.24)
GFR, Estimated: >60 mL/min (ref >60)
Glucose, Bld: 97 mg/dL (ref 70–99)
Potassium: 3.5 mmol/L (ref 3.5–5.1)
Sodium: 132 mmol/L — ABNORMAL LOW (ref 135–145)
Total Bilirubin: 1.5 mg/dL — ABNORMAL HIGH (ref 0.3–1.2)
Total Protein: 7.9 g/dL (ref 6.5–8.1)

## 2023-05-15 LAB — RESP PANEL BY RT-PCR (RSV, FLU A&B, COVID)  RVPGX2
Influenza A by PCR: NEGATIVE
Influenza B by PCR: NEGATIVE
Resp Syncytial Virus by PCR: NEGATIVE
SARS Coronavirus 2 by RT PCR: NEGATIVE

## 2023-05-15 LAB — SEDIMENTATION RATE: Sed Rate: 39 mm/hr — ABNORMAL HIGH (ref 0–16)

## 2023-05-15 LAB — APTT: aPTT: 32 seconds (ref 24–36)

## 2023-05-15 LAB — LACTIC ACID, PLASMA: Lactic Acid, Venous: 1.7 mmol/L (ref 0.5–1.9)

## 2023-05-15 LAB — URIC ACID: Uric Acid, Serum: 2.5 mg/dL — ABNORMAL LOW (ref 3.7–8.6)

## 2023-05-15 MED ORDER — LACTATED RINGERS IV BOLUS (SEPSIS)
1000.0000 mL | Freq: Once | INTRAVENOUS | Status: AC
  Administered 2023-05-15: 1000 mL via INTRAVENOUS

## 2023-05-15 MED ORDER — SULFAMETHOXAZOLE-TRIMETHOPRIM 800-160 MG PO TABS
1.0000 | ORAL_TABLET | Freq: Every day | ORAL | Status: DC
  Administered 2023-05-16 – 2023-05-26 (×11): 1 via ORAL
  Filled 2023-05-15 (×11): qty 1

## 2023-05-15 MED ORDER — ACETAMINOPHEN 325 MG PO TABS
650.0000 mg | ORAL_TABLET | Freq: Four times a day (QID) | ORAL | Status: DC | PRN
  Administered 2023-05-16 – 2023-05-17 (×2): 650 mg via ORAL
  Filled 2023-05-15 (×2): qty 2

## 2023-05-15 MED ORDER — HYDROMORPHONE HCL 1 MG/ML IJ SOLN
1.0000 mg | Freq: Once | INTRAMUSCULAR | Status: AC
  Administered 2023-05-15: 1 mg via INTRAVENOUS
  Filled 2023-05-15: qty 1

## 2023-05-15 MED ORDER — VANCOMYCIN HCL IN DEXTROSE 1-5 GM/200ML-% IV SOLN: 1000.0000 mg | Freq: Once | INTRAVENOUS | Status: DC

## 2023-05-15 MED ORDER — MORPHINE SULFATE (PF) 2 MG/ML IV SOLN
2.0000 mg | INTRAVENOUS | Status: DC | PRN
  Administered 2023-05-15: 2 mg via INTRAVENOUS
  Filled 2023-05-15: qty 1

## 2023-05-15 MED ORDER — METRONIDAZOLE 500 MG/100ML IV SOLN
500.0000 mg | Freq: Two times a day (BID) | INTRAVENOUS | Status: DC
  Administered 2023-05-16: 500 mg via INTRAVENOUS
  Filled 2023-05-15: qty 100

## 2023-05-15 MED ORDER — SODIUM CHLORIDE 0.9 % IV SOLN
2.0000 g | Freq: Once | INTRAVENOUS | Status: AC
  Administered 2023-05-15: 2 g via INTRAVENOUS
  Filled 2023-05-15: qty 12.5

## 2023-05-15 MED ORDER — ACETAMINOPHEN 650 MG RE SUPP: 650.0000 mg | Freq: Four times a day (QID) | RECTAL | Status: DC | PRN

## 2023-05-15 MED ORDER — ONDANSETRON HCL 4 MG/2ML IJ SOLN: 4.0000 mg | Freq: Four times a day (QID) | INTRAMUSCULAR | Status: DC | PRN

## 2023-05-15 MED ORDER — GABAPENTIN 300 MG PO CAPS
300.0000 mg | ORAL_CAPSULE | Freq: Three times a day (TID) | ORAL | Status: DC
  Administered 2023-05-15 – 2023-05-26 (×31): 300 mg via ORAL
  Filled 2023-05-15 (×31): qty 1

## 2023-05-15 MED ORDER — SODIUM CHLORIDE 0.9 % IV SOLN
1.0000 g | Freq: Three times a day (TID) | INTRAVENOUS | Status: DC
  Filled 2023-05-15 (×2): qty 10

## 2023-05-15 MED ORDER — ATORVASTATIN CALCIUM 40 MG PO TABS
40.0000 mg | ORAL_TABLET | Freq: Every day | ORAL | Status: DC
  Administered 2023-05-16 – 2023-05-22 (×7): 40 mg via ORAL
  Filled 2023-05-15 (×7): qty 1

## 2023-05-15 MED ORDER — IOHEXOL 300 MG/ML  SOLN
100.0000 mL | Freq: Once | INTRAMUSCULAR | Status: AC | PRN
  Administered 2023-05-15: 100 mL via INTRAVENOUS

## 2023-05-15 MED ORDER — ONDANSETRON HCL 4 MG PO TABS: 4.0000 mg | ORAL_TABLET | Freq: Four times a day (QID) | ORAL | Status: DC | PRN

## 2023-05-15 MED ORDER — LACTATED RINGERS IV SOLN: INTRAVENOUS | Status: AC

## 2023-05-15 MED ORDER — METRONIDAZOLE 500 MG/100ML IV SOLN
500.0000 mg | Freq: Once | INTRAVENOUS | Status: AC
  Administered 2023-05-15: 500 mg via INTRAVENOUS
  Filled 2023-05-15: qty 100

## 2023-05-15 MED ORDER — OXYCODONE HCL 5 MG PO TABS
5.0000 mg | ORAL_TABLET | ORAL | Status: DC | PRN
  Administered 2023-05-16 – 2023-05-17 (×4): 5 mg via ORAL
  Filled 2023-05-15 (×4): qty 1

## 2023-05-15 MED ORDER — SODIUM CHLORIDE 0.9 % IV SOLN
2.0000 g | Freq: Three times a day (TID) | INTRAVENOUS | Status: DC
  Administered 2023-05-16 (×2): 2 g via INTRAVENOUS
  Filled 2023-05-15 (×2): qty 12.5

## 2023-05-15 MED ORDER — VANCOMYCIN HCL 1750 MG/350ML IV SOLN
1750.0000 mg | Freq: Once | INTRAVENOUS | Status: AC
  Administered 2023-05-15: 1750 mg via INTRAVENOUS
  Filled 2023-05-15: qty 350

## 2023-05-15 MED ORDER — ACETAMINOPHEN 325 MG PO TABS
650.0000 mg | ORAL_TABLET | Freq: Once | ORAL | Status: AC
  Administered 2023-05-15: 650 mg via ORAL
  Filled 2023-05-15: qty 2

## 2023-05-15 MED ORDER — VANCOMYCIN HCL IN DEXTROSE 1-5 GM/200ML-% IV SOLN
1000.0000 mg | Freq: Two times a day (BID) | INTRAVENOUS | Status: DC
  Administered 2023-05-16 – 2023-05-20 (×9): 1000 mg via INTRAVENOUS
  Filled 2023-05-15 (×9): qty 200

## 2023-05-15 MED ORDER — BICTEGRAVIR-EMTRICITAB-TENOFOV 50-200-25 MG PO TABS
1.0000 | ORAL_TABLET | Freq: Every day | ORAL | Status: DC
  Administered 2023-05-16 – 2023-05-26 (×11): 1 via ORAL
  Filled 2023-05-15 (×12): qty 1
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
## 2023-05-15 NOTE — Assessment & Plan Note (Addendum)
Liver enzymes stable at baseline.   Platelet low likely in setting of sepsis/infection  INR wnl

## 2023-05-15 NOTE — Assessment & Plan Note (Addendum)
57 year old presenting with five day history of worsening swelling, pain and redness to knee and stump of his right BKA after getting bit by insect meeting sepsis criteria with fever to 102, tachycardia and leukocytosis  -admit to progressive -continue broad spectrum abx -knee xray shows no definitive evidence of OM -check CT knee to rule out abscess in stump  -TTP over knee, but edema Is generalized with induration more consistent with skin infection than joint infection, but continue to monitor closely  -continue IVF -anti-pyretics -BC pending -lactic acid wnl  -check inflammatory markers/uric acid  -pain control with oxycodone and PRN morphine for breakthrough pain

## 2023-05-15 NOTE — Assessment & Plan Note (Addendum)
Followed by ID Decreased CD4, increased viral load on labs in march  Continue Biktarvy and bactrim  Unsure if compliant with bactrim

## 2023-05-15 NOTE — Assessment & Plan Note (Signed)
Continue crestor, LFTs stable

## 2023-05-15 NOTE — Assessment & Plan Note (Addendum)
Check UDS  Declines nicotine patch

## 2023-05-15 NOTE — Progress Notes (Signed)
PHARMACY -  BRIEF ANTIBIOTIC NOTE   Pharmacy has received consult(s) for vancomycin and cefepime from an ED provider.  The patient's profile has been reviewed for ht/wt/allergies/indication/available labs.    One time order(s) placed for vancomycin 1750 mg + cefepime 2 g  Further antibiotics/pharmacy consults should be ordered by admitting physician if indicated.                       Thank you,  Pricilla Riffle, PharmD, BCPS Clinical Pharmacist 05/15/2023 4:51 PM

## 2023-05-15 NOTE — Progress Notes (Signed)
Pharmacy Antibiotic Note  Curtis Hodge is a 58 y.o. male admitted on 05/15/2023 with  sepsis + cellulits .  Pharmacy has been consulted for Vanco, Cefepime dosing.  Active Problem(s): Insect bite on R knee above stump, swollen with pus.   PMH: Pt has history of infections in stump, HIV, bullous emphysema, s/p BKA (January 2024), tobacco use   ID: Sepsis with Cellulitis/wound infection Tmax 102.2, WBC 7.9 WNL, Scr 0.97  Vanco 6/27>> Cefepime 6/27>> Flagyl 6/27>>  Plan: Flagyl 500mg  IV q12 Cefepime 2g IV q8hr  Vanco 1750mg  IV in ED Vancomycin 1000 mg IV Q 12 hrs. Goal AUC 400-550. Expected AUC:  SCr used: 4660.97     Height: 5\' 10"  (177.8 cm) Weight: 77.1 kg (170 lb) IBW/kg (Calculated) : 73  Temp (24hrs), Avg:102.2 F (39 C), Min:102.2 F (39 C), Max:102.2 F (39 C)  Recent Labs  Lab 05/15/23 1648  WBC 7.9  CREATININE 0.97  LATICACIDVEN 1.7    Estimated Creatinine Clearance: 87.8 mL/min (by C-G formula based on SCr of 0.97 mg/dL).    No Known Allergies  Ether Goebel S. Merilynn Finland, PharmD, BCPS Clinical Staff Pharmacist [email or web address] Pasty Spillers 05/15/2023 8:35 PM

## 2023-05-15 NOTE — Sepsis Progress Note (Signed)
Elink will follow per sepsis protocol  

## 2023-05-15 NOTE — Assessment & Plan Note (Addendum)
Stable. No compliant with inhalers
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
## 2023-05-15 NOTE — ED Triage Notes (Signed)
Patient arrived via [facility] from home. Patient states he was bit by some sort of insect on Sunday. He went to see his PCP on Monday for it where he got some antibiotics for it. Patient bit on right knee. Patient has stump on right side which is hot below the bite and warm above it. Stump appears swollen and he states pus will come out when scab comes off. Pt has history of infections in stump. Temp 103.3 by EMS, now 102.67F other vitals stable.

## 2023-05-15 NOTE — Assessment & Plan Note (Addendum)
Stool PCR and c.diff pending No abdominal pain, N/V  Continue gentle IVF Enteric precautions

## 2023-05-15 NOTE — ED Provider Notes (Signed)
[place] EMERGENCY DEPARTMENT AT [facility] Provider Note   CSN: [id number] Arrival date & time: 05/15/23  1536     History  Chief Complaint  Patient presents with   Insect Bite    Curtis Hodge is a 57 y.o. 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  Reports he was bit by an insect on Sunday evening, and started feeling unwell couple days later.  He has had a poor appetite, last meal was 24 hours ago which was a bowl of cereal. Says the pain in his right leg is so bad he was barely able to stand and reports he has been dizzy with standing. Reports large amounts of diarrhea since yesterday, every "5 minutes."  Denies cough, sore throat.  Reports shortness of breath, which is his baseline for COPD.  Smokes half pack cigarettes per day.      Home Medications Prior to Admission medications   Medication Sig Start Date End Date Taking? Authorizing Provider  albuterol (VENTOLIN HFA) 108 (90 Base) MCG/ACT inhaler Inhale 2 puffs into the lungs every 6 (six) hours as needed for wheezing or shortness of breath. 02/26/23   Anders Simmonds, PA-C  atorvastatin (LIPITOR) 40 MG tablet Take 1 tablet (40 mg total) by mouth daily. 02/26/23   Anders Simmonds, PA-C  bictegravir-emtricitabine-tenofovir AF (BIKTARVY) 50-200-25 MG TABS tablet Take 1 tablet by mouth daily. 02/26/23   Anders Simmonds, PA-C  cephALEXin (KEFLEX) 500 MG capsule Take 1 capsule (500 mg total) by mouth 3 (three) times daily. 05/13/23   Adonis Huguenin, NP  gabapentin (NEURONTIN) 100 MG capsule Take 1 capsule (100 mg total) by mouth 3 (three) times daily. When necessary for neuropathy pain 03/24/23   Nadara Mustard, MD  gabapentin (NEURONTIN) 300 MG capsule Take 1 capsule (300 mg total) by mouth 3 (three) times daily. 04/18/23   Adonis Huguenin, NP  oxyCODONE-acetaminophen (PERCOCET/ROXICET) 5-325 MG tablet Take 1  tablet by mouth every 12 (twelve) hours as needed for severe pain. 05/13/23   Adonis Huguenin, NP  sulfamethoxazole-trimethoprim (BACTRIM DS) 800-160 MG tablet Take 1 tablet by mouth daily. 02/26/23   Anders Simmonds, PA-C  tiotropium (SPIRIVA HANDIHALER) 18 MCG inhalation capsule Place 1 capsule (18 mcg total) into inhaler and inhale daily. 02/26/23   Anders Simmonds, PA-C      Allergies    Patient has no known allergies.    Review of Systems   Review of Systems  Constitutional:  Positive for chills and fever.  Respiratory:  Positive for shortness of breath. Negative for cough.   Gastrointestinal:  Positive for diarrhea. Negative for nausea and vomiting.    Physical Exam Updated Vital Signs BP (!) 143/85 (BP Location: Right Arm)   Pulse 96   Temp (!) 102.2 F (39 C) (Oral)   Ht 5\' 10"  (1.778 m)   Wt 77.1 kg   SpO2 100%   BMI 24.39 kg/m  Physical Exam Constitutional:      Appearance: Normal appearance. He is not toxic-appearing.  HENT:     Mouth/Throat:     Mouth: Mucous membranes are moist.     Pharynx: Oropharynx is clear.  Eyes:     Extraocular Movements: Extraocular movements intact.     Pupils: Pupils are equal, round, and reactive to light.     Comments: Bilateral conjunctival injection  Cardiovascular:  Rate and Rhythm: Regular rhythm. Tachycardia present.  Pulmonary:     Effort: Pulmonary effort is normal.     Breath sounds: Normal breath sounds.  Abdominal:     General: Abdomen is flat.     Palpations: Abdomen is soft.     Tenderness: There is abdominal tenderness (generalized).  Musculoskeletal:        General: Normal range of motion.     Cervical back: Normal range of motion.     Comments: Left leg stump swelling, erythema and tenderness  Skin:    General: Skin is warm and dry.  Neurological:     General: No focal deficit present.     Mental Status: He is alert and oriented to person, place, and time.  Psychiatric:        Mood and Affect: Mood  normal.        Behavior: Behavior normal.     ED Results / Procedures / Treatments   Labs (all labs ordered are listed, but only abnormal results are displayed) Labs Reviewed  RESP PANEL BY RT-PCR (RSV, FLU A&B, COVID)  [id number]  CULTURE, BLOOD (ROUTINE X 2)  CULTURE, BLOOD (ROUTINE X 2)  C DIFFICILE QUICK SCREEN W PCR REFLEX    LACTIC ACID, PLASMA  LACTIC ACID, PLASMA  COMPREHENSIVE METABOLIC PANEL  CBC WITH DIFFERENTIAL/PLATELET  PROTIME-INR  APTT  URINALYSIS, W/ REFLEX TO CULTURE (INFECTION SUSPECTED)    EKG None  Radiology No results found.  Procedures Procedures    Medications Ordered in ED Medications  lactated ringers infusion (has no administration in time range)  lactated ringers bolus 1,000 mL (has no administration in time range)  ceFEPIme (MAXIPIME) 2 g in sodium chloride 0.9 % 100 mL IVPB (has no administration in time range)  metroNIDAZOLE (FLAGYL) IVPB 500 mg (has no administration in time range)  HYDROmorphone (DILAUDID) injection 1 mg (has no administration in time range)  acetaminophen (TYLENOL) tablet 650 mg (has no administration in time range)  vancomycin (VANCOREADY) IVPB 1750 mg/350 mL (has no administration in time range)    ED Course/ Medical Decision Making/ A&P                             Medical Decision Making 57 year old male with right BKA cellulitis.  Stump does not have any appreciable purulence, but is warm, tender and erythematous with mild swelling. Reassuringly, has good ROM which lowers concern for septic joint.  Vitals with concern for evolving sepsis with fever to 102.2 Fahrenheit, tachycardia with heart rate in low 100s. Patient is immunosuppressed with uncontrolled HIV, per most recent labs 3 months ago, CD4 count 18 with viral load 76,000.  Also question underlying gastrointestinal infection given reported profuse diarrhea.  Labs showed hyponatremia to 132, transaminitis which appears to be chronic, no leukocytosis  (although patient is immunosuppressed) and thrombocytopenia to 101K ordered blood cultures, urine culture and C. difficile PCR.  Initiated fluid resuscitation with LR bolus x 1, MIVF, and started broad-spectrum antibiotics (vancomycin, cefepime, metronidazole).  Pain treated with Dilaudid x 1.  Plan to admit to hospitalist for further IV antibiotics and evaluation/management of cellulitis/diarrheal illness in the setting of meeting sepsis criteria.   Amount and/or Complexity of Data Reviewed Labs: ordered. Radiology: ordered. ECG/medicine tests: ordered.  Risk OTC drugs. Prescription drug management. Decision regarding hospitalization.          Final Clinical Impression(s) / ED Diagnoses Final diagnoses:  None    Rx /  DC Orders ED Discharge Orders     None         Darral Dash, DO 05/15/23 1933    Maia Plan, MD 05/20/23 [id number]

## 2023-05-15 NOTE — Assessment & Plan Note (Signed)
Likely due to sepsis vs. Liver disease Typically wnl at baseline Continue to monitor

## 2023-05-16 DIAGNOSIS — L03115 Cellulitis of right lower limb: Secondary | ICD-10-CM | POA: Diagnosis not present

## 2023-05-16 DIAGNOSIS — A419 Sepsis, unspecified organism: Secondary | ICD-10-CM | POA: Diagnosis not present

## 2023-05-16 DIAGNOSIS — F17220 Nicotine dependence, chewing tobacco, uncomplicated: Secondary | ICD-10-CM

## 2023-05-16 DIAGNOSIS — B20 Human immunodeficiency virus [HIV] disease: Secondary | ICD-10-CM | POA: Diagnosis not present

## 2023-05-16 DIAGNOSIS — L0390 Cellulitis, unspecified: Secondary | ICD-10-CM | POA: Diagnosis not present

## 2023-05-16 DIAGNOSIS — T8743 Infection of amputation stump, right lower extremity: Secondary | ICD-10-CM | POA: Diagnosis not present

## 2023-05-16 LAB — COMPREHENSIVE METABOLIC PANEL
ALT: 53 U/L — ABNORMAL HIGH (ref 0–44)
AST: 58 U/L — ABNORMAL HIGH (ref 15–41)
Albumin: 2.5 g/dL — ABNORMAL LOW (ref 3.5–5.0)
Alkaline Phosphatase: 74 U/L (ref 38–126)
Anion gap: 4 — ABNORMAL LOW (ref 5–15)
BUN: 11 mg/dL (ref 6–20)
CO2: 27 mmol/L (ref 22–32)
Calcium: 8.2 mg/dL — ABNORMAL LOW (ref 8.9–10.3)
Chloride: 101 mmol/L (ref 98–111)
Creatinine, Ser: 1.01 mg/dL (ref 0.61–1.24)
GFR, Estimated: >60 mL/min (ref >60)
Glucose, Bld: 167 mg/dL — ABNORMAL HIGH (ref 70–99)
Potassium: 4 mmol/L (ref 3.5–5.1)
Sodium: 132 mmol/L — ABNORMAL LOW (ref 135–145)
Total Bilirubin: 1.1 mg/dL (ref 0.3–1.2)
Total Protein: 6.7 g/dL (ref 6.5–8.1)

## 2023-05-16 LAB — MAGNESIUM: Magnesium: 1.9 mg/dL (ref 1.7–2.4)

## 2023-05-16 LAB — RAPID URINE DRUG SCREEN, HOSP PERFORMED
Amphetamines: NOT DETECTED
Barbiturates: NOT DETECTED
Benzodiazepines: NOT DETECTED
Cocaine: POSITIVE — AB
Opiates: NOT DETECTED
Tetrahydrocannabinol: POSITIVE — AB

## 2023-05-16 LAB — CBC
HCT: 34.4 % — ABNORMAL LOW (ref 39.0–52.0)
Hemoglobin: 11.5 g/dL — ABNORMAL LOW (ref 13.0–17.0)
MCH: 32.7 pg (ref 26.0–34.0)
MCHC: 33.4 g/dL (ref 30.0–36.0)
MCV: 97.7 fL (ref 80.0–100.0)
Platelets: 86 10*3/uL — ABNORMAL LOW (ref 150–400)
RBC: 3.52 MIL/uL — ABNORMAL LOW (ref 4.22–5.81)
RDW: 13 % (ref 11.5–15.5)
WBC: 7.1 10*3/uL (ref 4.0–10.5)
nRBC: 0 % (ref 0.0–0.2)

## 2023-05-16 LAB — CULTURE, BLOOD (ROUTINE X 2): Culture: NO GROWTH

## 2023-05-16 LAB — C-REACTIVE PROTEIN: CRP: 10.7 mg/dL — ABNORMAL HIGH (ref <1.0)

## 2023-05-16 MED ORDER — MORPHINE SULFATE (PF) 4 MG/ML IV SOLN
4.0000 mg | INTRAVENOUS | Status: DC | PRN
  Administered 2023-05-16 – 2023-05-22 (×8): 4 mg via INTRAVENOUS
  Filled 2023-05-16 (×9): qty 1

## 2023-05-16 MED ORDER — METHOCARBAMOL 1000 MG/10ML IJ SOLN
500.0000 mg | Freq: Three times a day (TID) | INTRAVENOUS | Status: DC | PRN
  Administered 2023-05-18: 500 mg via INTRAVENOUS
  Filled 2023-05-16: qty 500
  Filled 2023-05-16: qty 5

## 2023-05-16 MED ORDER — MELATONIN 5 MG PO TABS
5.0000 mg | ORAL_TABLET | Freq: Every evening | ORAL | Status: AC | PRN
  Administered 2023-05-16 – 2023-05-17 (×2): 5 mg via ORAL
  Filled 2023-05-16 (×2): qty 1

## 2023-05-16 MED ORDER — SODIUM CHLORIDE 0.9 % IV SOLN
2.0000 g | INTRAVENOUS | Status: DC
  Administered 2023-05-16 – 2023-05-22 (×7): 2 g via INTRAVENOUS
  Filled 2023-05-16 (×8): qty 20

## 2023-05-16 MED ORDER — PANTOPRAZOLE SODIUM 40 MG PO TBEC
40.0000 mg | DELAYED_RELEASE_TABLET | Freq: Every day | ORAL | Status: DC
  Administered 2023-05-16 – 2023-05-26 (×11): 40 mg via ORAL
  Filled 2023-05-16 (×11): qty 1

## 2023-05-16 MED ORDER — KETOROLAC TROMETHAMINE 15 MG/ML IJ SOLN
15.0000 mg | Freq: Four times a day (QID) | INTRAMUSCULAR | Status: AC
  Administered 2023-05-16 – 2023-05-21 (×19): 15 mg via INTRAVENOUS
  Filled 2023-05-16 (×19): qty 1

## 2023-05-16 NOTE — Plan of Care (Signed)

## 2023-05-16 NOTE — Progress Notes (Signed)
PROGRESS NOTE    Curtis Hodge  WJX:[id number] DOB: 12-Nov-1966 DOA: 05/15/2023 PCP: Hoy Register, MD  Chief Complaint  Patient presents with   Insect Bite    Brief Narrative:   Curtis Hodge is Curtis Hodge 57 y.o. male with medical history significant of COPD, hepatitis, HIV, schizophrenia,  substance use disorder, hx of OM s/p right BKA in 11/2022 who presented with concerns for infection in his BKA stump after he had an insect bite on Sunday. He got bit by something on his knee and noticed his stump started to swell that night. It progressively got worse, more red and painful.  He had some drainage from the bite, but none recently.  He decided to call EMS b/c he felt so bad and the pain was bad.  He had no idea he had Gia Lusher fever until he got here. No fevers at home, but he never took his temperature. He denies any chills. No N/V, but has had diarrhea that started two days ago. He denies any blood in his stool. No stomach pain. He has had about 3 episodes of diarrhea/day. It is loose, not watery. No other sick contacts.    Denies any fever/chills, vision change+headaches, chest pain or palpitations, shortness of breath or cough, abdominal pain, N/V, dysuria or leg swelling.    Saw ortho on 6/25. Note not done, looks like started on keflex. Unsure if he is taking.      He smokes 1/2 PPD. Does not drink alcohol. Occasional MJ, denies drug use.   Assessment & Plan:   Principal Problem:   Sepsis due to cellulitis [facility]) Active Problems:   Diarrhea   Thrombocytopenia (HCC)   Chronic hepatitis B without delta agent without hepatic coma (HCC)   HIV disease (HCC)   Bullous emphysema (HCC)   Substance abuse (HCC)   Hyperlipidemia  Sepsis due to cellulitis of right lower extremity 57 year old presenting with five day history of worsening swelling, pain and redness to knee and stump of his right BKA after getting bit by insect meeting sepsis criteria with fever to 102, tachycardia and  leukocytosis  - saw ortho 6/25, got rx for keflex - failed keflex - CT R knee with diffuse subcutaneous edema c/w cellulitis (no fluid collection or abscess) - no evidence osteomyelitis - CRP 10.7, sed rate 39 - uric acid low - no evidence osteo on imaging, no reported fluid in joint in imaging  - follow blood cultures  - continue broad spectrum abx with vanc given report purulent drainage - scheduled toradol, prn oxy/morphine - bowel regimen - appreciate ID assistance in setting of HIV infection  Tremor - due to pain, follow    Diarrhea Stool PCR and c.diff pending No abdominal pain, N/V  Continue gentle IVF Enteric precautions    Thrombocytopenia (HCC) Likely due to sepsis vs. Liver disease Typically wnl at baseline Continue to monitor    Chronic hepatitis B without delta agent without hepatic coma (HCC) Liver enzymes stable at baseline.   Platelet low likely in setting of sepsis/infection  INR wnl    HIV disease (HCC) Followed by ID Decreased CD4 (195 in March 2024) Continue Biktarvy and bactrim  Unsure if compliant with bactrim    Bullous emphysema (HCC) Stable. No compliant with inhalers    Substance abuse (HCC) Check UDS (cocaine, THC) Declines nicotine patch    Hyperlipidemia Continue crestor, LFTs stable      DVT prophylaxis: SCD Code Status: full Family Communication: none Disposition:  Status is: Inpatient Remains inpatient appropriate because: need for IV abx   Consultants:  ID  Procedures:  none  Antimicrobials:  Anti-infectives (From admission, onward)    Start     Dose/Rate Route Frequency Ordered Stop   05/16/23 1000  sulfamethoxazole-trimethoprim (BACTRIM DS) 800-160 MG per tablet 1 tablet        1 tablet Oral Daily 05/15/23 2059     05/16/23 0600  metroNIDAZOLE (FLAGYL) IVPB 500 mg  Status:  Discontinued        500 mg 100 mL/hr over 60 Minutes Intravenous Every 12 hours 05/15/23 2026 05/16/23 0746   05/16/23 0600  vancomycin  (VANCOCIN) IVPB 1000 mg/200 mL premix        1,000 mg 200 mL/hr over 60 Minutes Intravenous Every 12 hours 05/15/23 2035     05/16/23 0130  ceFEPIme (MAXIPIME) 1 g in sodium chloride 0.9 % 100 mL IVPB  Status:  Discontinued        1 g 200 mL/hr over 30 Minutes Intravenous Every 8 hours 05/15/23 2035 05/15/23 2037   05/16/23 0130  ceFEPIme (MAXIPIME) 2 g in sodium chloride 0.9 % 100 mL IVPB  Status:  Discontinued        2 g 200 mL/hr over 30 Minutes Intravenous Every 8 hours 05/15/23 2037 05/16/23 1532   05/15/23 2145  bictegravir-emtricitabine-tenofovir AF (BIKTARVY) 50-200-25 MG per tablet 1 tablet        1 tablet Oral Daily 05/15/23 2059     05/15/23 1700  vancomycin (VANCOREADY) IVPB 1750 mg/350 mL        1,750 mg 175 mL/hr over 120 Minutes Intravenous  Once 05/15/23 1650 05/15/23 2021   05/15/23 1645  ceFEPIme (MAXIPIME) 2 g in sodium chloride 0.9 % 100 mL IVPB        2 g 200 mL/hr over 30 Minutes Intravenous  Once 05/15/23 1644 05/15/23 1741   05/15/23 1645  metroNIDAZOLE (FLAGYL) IVPB 500 mg        500 mg 100 mL/hr over 60 Minutes Intravenous  Once 05/15/23 1644 05/15/23 1857   05/15/23 1645  vancomycin (VANCOCIN) IVPB 1000 mg/200 mL premix  Status:  Discontinued        1,000 mg 200 mL/hr over 60 Minutes Intravenous  Once 05/15/23 1644 05/15/23 1650       Subjective: C/o R stump pain Worried about his leg  Objective: Vitals:   05/15/23 1815 05/16/23 0257 05/16/23 0506 05/16/23 1419  BP: (!) 142/97 (!) 143/82 139/74 (!) 151/86  Pulse: 97 83 83 89  Resp: 13  18 19   Temp:  98.9 F (37.2 C) 98.7 F (37.1 C) 98.7 F (37.1 C)  TempSrc:  Oral Oral Oral  SpO2: 98% 95% 98% 100%  Weight:      Height:        Intake/Output Summary (Last 24 hours) at 05/16/2023 1600 Last data filed at 05/16/2023 1259 Gross per 24 hour  Intake 2125 ml  Output 2625 ml  Net -500 ml   Filed Weights   05/15/23 1552 05/15/23 1702  Weight: 77.1 kg 77.1 kg    Examination:  General exam:  Appears anxious and in distress regarding his situation Respiratory system: unlabored Central nervous system: Alert and oriented. No focal neurological deficits. Extremities: Erythema and edema to RLE, R BKA, has good ROM to knee despite swelling    Data Reviewed: I have personally reviewed following labs and imaging studies  CBC: Recent Labs  Lab 05/15/23 1648 05/16/23 0416  WBC  7.9 7.1  NEUTROABS 6.4  --   HGB 12.5* 11.5*  HCT 37.7* 34.4*  MCV 97.2 97.7  PLT 101* 86*    Basic Metabolic Panel: Recent Labs  Lab 05/15/23 1648 05/16/23 0416  NA 132* 132*  K 3.5 4.0  CL 101 101  CO2 25 27  GLUCOSE 97 167*  BUN 10 11  CREATININE 0.97 1.01  CALCIUM 8.2* 8.2*  MG  --  1.9    GFR: Estimated Creatinine Clearance: 84.3 mL/min (by C-G formula based on SCr of 1.01 mg/dL).  Liver Function Tests: Recent Labs  Lab 05/15/23 1648 05/16/23 0416  AST 71* 58*  ALT 68* 53*  ALKPHOS 88 74  BILITOT 1.5* 1.1  PROT 7.9 6.7  ALBUMIN 3.0* 2.5*    CBG: No results for input(s): "GLUCAP" in the last 168 hours.   Recent Results (from the past 240 hour(s))  Blood Culture (routine x 2)     Status: None (Preliminary result)   Collection Time: 05/15/23  4:40 PM   Specimen: BLOOD LEFT ARM  Result Value Ref Range Status   Specimen Description   Final    BLOOD LEFT ARM Performed at [facility], [address], [place], [place] [address]    Special Requests   Final    BOTTLES DRAWN AEROBIC AND ANAEROBIC Blood Culture results may not be optimal due to an inadequate volume of blood received in culture bottles Performed at [facility], [address] [place], [place] [address]    Culture   Final    NO GROWTH < 24 HOURS Performed at [facility], [address], [place], [place] [address]    Report Status PENDING  Incomplete  Blood Culture (routine x 2)     Status: None (Preliminary result)   Collection Time: 05/15/23  4:48 PM   Specimen: BLOOD  RIGHT ARM  Result Value Ref Range Status   Specimen Description   Final    BLOOD RIGHT ARM Performed at [facility], [address], [place], [place] [address]    Special Requests   Final    BOTTLES DRAWN AEROBIC AND ANAEROBIC Blood Culture results may not be optimal due to an excessive volume of blood received in culture bottles Performed at [facility], [address], [place], [place] [address]    Culture   Final    NO GROWTH < 24 HOURS Performed at [facility], [address] [place], [place] [address]    Report Status PENDING  Incomplete  Resp panel by RT-PCR (RSV, Flu Lux Skilton&B, Covid) Urine, Clean Catch     Status: None   Collection Time: 05/15/23  5:42 PM   Specimen: Urine, Clean Catch; Nasal Swab  Result Value Ref Range Status   SARS Coronavirus 2 by RT PCR NEGATIVE NEGATIVE Final    Comment: (NOTE) SARS-CoV-2 target nucleic acids are NOT DETECTED.  The SARS-CoV-2 RNA is generally detectable in upper respiratory specimens during the acute phase of infection. The lowest concentration of SARS-CoV-2 viral copies this assay can detect is 138 copies/mL. Zannie Locastro negative result does not preclude SARS-Cov-2 infection and should not be used as the sole basis for treatment or other patient management decisions. Lindsie Simar negative result may occur with  improper specimen collection/handling, submission of specimen other than nasopharyngeal swab, presence of viral mutation(s) within the areas targeted by this assay, and inadequate number of viral copies(<138 copies/mL). Deontrey Massi negative result must be combined with  clinical observations, patient history, and epidemiological information. The expected result is Negative.  Fact Sheet for Patients:  BloggerCourse.com  Fact Sheet for Healthcare Providers:  SeriousBroker.it  This test is no t yet approved or cleared by the [place] FDA and  has been authorized for  detection and/or diagnosis of SARS-CoV-2 by FDA under an Emergency Use Authorization (EUA). This EUA will remain  in effect (meaning this test can be used) for the duration of the COVID-19 declaration under Section 564(b)(1) of the Act, 21 U.S.C.section 360bbb-3(b)(1), unless the authorization is terminated  or revoked sooner.       Influenza Shawnette Augello by PCR NEGATIVE NEGATIVE Final   Influenza B by PCR NEGATIVE NEGATIVE Final    Comment: (NOTE) The Xpert Xpress SARS-CoV-2/FLU/RSV plus assay is intended as an aid in the diagnosis of influenza from Nasopharyngeal swab specimens and should not be used as Ethlyn Alto sole basis for treatment. Nasal washings and aspirates are unacceptable for Xpert Xpress SARS-CoV-2/FLU/RSV testing.  Fact Sheet for Patients: BloggerCourse.com  Fact Sheet for Healthcare Providers: SeriousBroker.it  This test is not yet approved or cleared by the [place] FDA and has been authorized for detection and/or diagnosis of SARS-CoV-2 by FDA under an Emergency Use Authorization (EUA). This EUA will remain in effect (meaning this test can be used) for the duration of the COVID-19 declaration under Section 564(b)(1) of the Act, 21 U.S.C. section 360bbb-3(b)(1), unless the authorization is terminated or revoked.     Resp Syncytial Virus by PCR NEGATIVE NEGATIVE Final    Comment: (NOTE) Fact Sheet for Patients: BloggerCourse.com  Fact Sheet for Healthcare Providers: SeriousBroker.it  This test is not yet approved or cleared by the [place] FDA and has been authorized for detection and/or diagnosis of SARS-CoV-2 by FDA under an Emergency Use Authorization (EUA). This EUA will remain in effect (meaning this test can be used) for the duration of the COVID-19 declaration under Section 564(b)(1) of the Act, 21 U.S.C. section 360bbb-3(b)(1), unless the authorization is  terminated or revoked.  Performed at [facility], [address] [place], [place] [address]          Radiology Studies: CT KNEE RIGHT W CONTRAST  Result Date: 05/15/2023 CLINICAL DATA:  Cellulitis at right BKA stump, insect bite, swelling EXAM: CT OF THE RIGHT KNEE WITH CONTRAST TECHNIQUE: Multidetector CT imaging was performed following the standard protocol during bolus administration of intravenous contrast. RADIATION DOSE REDUCTION: This exam was performed according to the departmental dose-optimization program which includes automated exposure control, adjustment of the mA and/or kV according to patient size and/or use of iterative reconstruction technique. CONTRAST:  OMNIPAQUE IOHEXOL 300 MG/ML  SOLN COMPARISON:  05/15/2023 FINDINGS: Bones/Joint/Cartilage The bones are diffusely osteopenic. There are no areas of bony destruction or periosteal reaction to suggest osteomyelitis. Alignment of the right knee is anatomic. No right knee effusion. Ligaments Suboptimally assessed by CT. Muscles and Tendons No acute muscular abnormalities. No intramuscular fluid collection or abnormal enhancement. Soft tissues There is diffuse subcutaneous edema consistent with given history of cellulitis. No evidence of subcutaneous gas or fluid collection. No evidence of abscess. There is diffuse atherosclerosis. Reconstructed images demonstrate no additional findings. IMPRESSION: 1. Diffuse subcutaneous edema consistent with cellulitis. No fluid collection or abscess. 2. Diffuse osteopenia. No focal bony destruction or periosteal reaction to suggest osteomyelitis. Electronically Signed   By: Sharlet Salina M.D.   On: 05/15/2023 21:55   DG Knee Complete 4 Views Right  Result Date: 05/15/2023  CLINICAL DATA:  Right stump cellulitis. Patient reports he was bit by some sort of insect 4 days ago. Currently on antibiotics. Soft tissue is hot below the bite and warmth above it. Stump swelling.  History of stump infections. EXAM: RIGHT KNEE - COMPLETE 4+ VIEW COMPARISON:  Right tibia and fibula radiographs 12/16/2022 FINDINGS: Postsurgical changes are again seen of amputation to the level of the proximal tibial and fibular diaphyses. Interval removal of the prior distal surgical skin staples. No definitive cortical erosion is seen, within limitation of distal postsurgical change. No subcutaneous air is seen. No acute fracture or dislocation. Mild chronic enthesopathic change at the quadriceps insertion on the patella. IMPRESSION: Postsurgical changes of amputation to the level of the proximal tibial and fibular diaphyses. The distal bone amputation sites appear fairly sharp, and no definitive radiographic evidence of osteomyelitis is seen. Electronically Signed   By: Neita Garnet M.D.   On: 05/15/2023 18:15   DG Chest Port 1 View  Result Date: 05/15/2023 CLINICAL DATA:  Questionable sepsis EXAM: PORTABLE CHEST 1 VIEW COMPARISON:  11/21/2022 FINDINGS: Cardiac and mediastinal contours are within normal limits. No focal pulmonary opacity. No pleural effusion or pneumothorax. No acute osseous abnormality. IMPRESSION: No acute cardiopulmonary process. Electronically Signed   By: Wiliam Ke M.D.   On: 05/15/2023 17:07        Scheduled Meds:  atorvastatin  40 mg Oral Daily   bictegravir-emtricitabine-tenofovir AF  1 tablet Oral Daily   gabapentin  300 mg Oral TID   ketorolac  15 mg Intravenous Q6H   sulfamethoxazole-trimethoprim  1 tablet Oral Daily   Continuous Infusions:  methocarbamol (ROBAXIN) IV     vancomycin 1,000 mg (05/16/23 0727)     LOS: 1 day    Time spent: over 30 min    Lacretia Nicks, MD [facility]   To contact the attending provider between 7A-7P or the covering provider during after hours 7P-7A, please log into the web site [email or web address] and access using universal [place] password for that web site. If you do not have the password, please call the  hospital operator.  05/16/2023, 4:00 PM
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
## 2023-05-17 DIAGNOSIS — L03116 Cellulitis of left lower limb: Secondary | ICD-10-CM

## 2023-05-17 DIAGNOSIS — B20 Human immunodeficiency virus [HIV] disease: Secondary | ICD-10-CM | POA: Diagnosis not present

## 2023-05-17 DIAGNOSIS — T8743 Infection of amputation stump, right lower extremity: Secondary | ICD-10-CM | POA: Diagnosis not present

## 2023-05-17 DIAGNOSIS — L0390 Cellulitis, unspecified: Secondary | ICD-10-CM | POA: Diagnosis not present

## 2023-05-17 DIAGNOSIS — K7460 Unspecified cirrhosis of liver: Secondary | ICD-10-CM

## 2023-05-17 DIAGNOSIS — A419 Sepsis, unspecified organism: Secondary | ICD-10-CM | POA: Diagnosis not present

## 2023-05-17 DIAGNOSIS — B1910 Unspecified viral hepatitis B without hepatic coma: Secondary | ICD-10-CM

## 2023-05-17 LAB — HIV-1 RNA QUANT-NO REFLEX-BLD
HIV 1 RNA Quant: 21300 copies/mL
LOG10 HIV-1 RNA: 4.328 log10copy/mL

## 2023-05-17 LAB — CRYPTOCOCCAL ANTIGEN: Crypto Ag: NEGATIVE

## 2023-05-17 LAB — CULTURE, BLOOD (ROUTINE X 2)

## 2023-05-17 LAB — MRSA NEXT GEN BY PCR, NASAL: MRSA by PCR Next Gen: DETECTED — AB

## 2023-05-17 NOTE — Progress Notes (Signed)
PROGRESS NOTE    Curtis Hodge  WUJ:[id number] DOB: 1966/01/05 DOA: 05/15/2023 PCP: Hoy Register, MD  Chief Complaint  Patient presents with   Insect Bite    Brief Narrative:   Curtis Hodge is Curtis Hodge 57 y.o. male with medical history significant of COPD, hepatitis, HIV, schizophrenia,  substance use disorder, hx of OM s/p right BKA in 11/2022 who presented with concerns for infection in his BKA stump after he had an insect bite on Sunday. He got bit by something on his knee and noticed his stump started to swell that night. It progressively got worse, more red and painful.  He had some drainage from the bite, but none recently.  He decided to call EMS b/c he felt so bad and the pain was bad.  He had no idea he had Curtis Hodge fever until he got here. No fevers at home, but he never took his temperature. He denies any chills. No N/V, but has had diarrhea that started two days ago. He denies any blood in his stool. No stomach pain. He has had about 3 episodes of diarrhea/day. It is loose, not watery. No other sick contacts.    Denies any fever/chills, vision change+headaches, chest pain or palpitations, shortness of breath or cough, abdominal pain, N/V, dysuria or leg swelling.    Saw ortho on 6/25. Note not done, looks like started on keflex. Unsure if he is taking.      He smokes 1/2 PPD. Does not drink alcohol. Occasional MJ, denies drug use.   Assessment & Plan:   Principal Problem:   Sepsis due to cellulitis [facility]) Active Problems:   Diarrhea   Thrombocytopenia (HCC)   Chronic hepatitis B without delta agent without hepatic coma (HCC)   HIV disease (HCC)   Bullous emphysema (HCC)   Substance abuse (HCC)   Hyperlipidemia  Sepsis due to cellulitis of right lower extremity 57 year old presenting with five day history of worsening swelling, pain and redness to knee and stump of his right BKA after getting bit by insect meeting sepsis criteria with fever to 102, tachycardia and  leukocytosis  - saw ortho 6/25, got rx for keflex - failed keflex - CT R knee with diffuse subcutaneous edema c/w cellulitis (no fluid collection or abscess) - no evidence osteomyelitis - CRP 10.7, sed rate 39 - uric acid low - more knee swelling today on my exam, appreciate orthopedics evaluation -> low suspicion for septic arthritis  - follow blood cultures  - continue broad spectrum abx with vanc given report purulent drainage - scheduled toradol, prn oxy/morphine - bowel regimen - appreciate ID assistance in setting of HIV infection  Tremor - due to pain, follow  - seems improved   Diarrhea Resolved, will hold GI path and c diff for now   Thrombocytopenia (HCC) Likely due to sepsis vs. Liver disease Typically wnl at baseline Continue to monitor    Chronic hepatitis B without delta agent without hepatic coma (HCC) Liver enzymes stable at baseline.   Platelet low likely in setting of sepsis/infection  INR wnl    HIV disease (HCC) Followed by ID Decreased CD4 (195 in March 2024) Continue Biktarvy and bactrim  Unsure if compliant with bactrim    Bullous emphysema (HCC) Stable. No compliant with inhalers    Substance abuse (HCC) Check UDS (cocaine, THC) Declines nicotine patch    Hyperlipidemia Continue crestor, LFTs stable      DVT prophylaxis: SCD Code Status: full Family Communication: none Disposition:  Status is: Inpatient Remains inpatient appropriate because: need for IV abx   Consultants:  ID  Procedures:  none  Antimicrobials:  Anti-infectives (From admission, onward)    Start     Dose/Rate Route Frequency Ordered Stop   05/16/23 1000  sulfamethoxazole-trimethoprim (BACTRIM DS) 800-160 MG per tablet 1 tablet        1 tablet Oral Daily 05/15/23 2059     05/16/23 0600  metroNIDAZOLE (FLAGYL) IVPB 500 mg  Status:  Discontinued        500 mg 100 mL/hr over 60 Minutes Intravenous Every 12 hours 05/15/23 2026 05/16/23 0746   05/16/23 0600   vancomycin (VANCOCIN) IVPB 1000 mg/200 mL premix        1,000 mg 200 mL/hr over 60 Minutes Intravenous Every 12 hours 05/15/23 2035     05/16/23 0130  ceFEPIme (MAXIPIME) 1 g in sodium chloride 0.9 % 100 mL IVPB  Status:  Discontinued        1 g 200 mL/hr over 30 Minutes Intravenous Every 8 hours 05/15/23 2035 05/15/23 2037   05/16/23 0130  ceFEPIme (MAXIPIME) 2 g in sodium chloride 0.9 % 100 mL IVPB  Status:  Discontinued        2 g 200 mL/hr over 30 Minutes Intravenous Every 8 hours 05/15/23 2037 05/16/23 1532   05/15/23 2145  bictegravir-emtricitabine-tenofovir AF (BIKTARVY) 50-200-25 MG per tablet 1 tablet        1 tablet Oral Daily 05/15/23 2059     05/15/23 1700  vancomycin (VANCOREADY) IVPB 1750 mg/350 mL        1,750 mg 175 mL/hr over 120 Minutes Intravenous  Once 05/15/23 1650 05/15/23 2021   05/15/23 1645  ceFEPIme (MAXIPIME) 2 g in sodium chloride 0.9 % 100 mL IVPB        2 g 200 mL/hr over 30 Minutes Intravenous  Once 05/15/23 1644 05/15/23 1741   05/15/23 1645  metroNIDAZOLE (FLAGYL) IVPB 500 mg        500 mg 100 mL/hr over 60 Minutes Intravenous  Once 05/15/23 1644 05/15/23 1857   05/15/23 1645  vancomycin (VANCOCIN) IVPB 1000 mg/200 mL premix  Status:  Discontinued        1,000 mg 200 mL/hr over 60 Minutes Intravenous  Once 05/15/23 1644 05/15/23 1650       Subjective: Continued R knee pain  Objective: Vitals:   05/15/23 1815 05/16/23 0257 05/16/23 0506 05/16/23 1419  BP: (!) 142/97 (!) 143/82 139/74 (!) 151/86  Pulse: 97 83 83 89  Resp: 13  18 19   Temp:  98.9 F (37.2 C) 98.7 F (37.1 C) 98.7 F (37.1 C)  TempSrc:  Oral Oral Oral  SpO2: 98% 95% 98% 100%  Weight:      Height:        Intake/Output Summary (Last 24 hours) at 05/16/2023 1600 Last data filed at 05/16/2023 1259 Gross per 24 hour  Intake 2125 ml  Output 2625 ml  Net -500 ml   Filed Weights   05/15/23 1552 05/15/23 1702  Weight: 77.1 kg 77.1 kg    Examination:  General: No acute  distress. Cardiovascular: RRR Lungs: unlabored Neurological: Alert and oriented 3. Moves all extremities 4. Cranial nerves II through XII grossly intact. Extremities: R BKA, continued swelling, improved erythema - more swelling to R knee noted today   Data Reviewed: I have personally reviewed following labs and imaging studies  CBC: Recent Labs  Lab 05/15/23 1648 05/16/23 0416  WBC 7.9 7.1  NEUTROABS  6.4  --   HGB 12.5* 11.5*  HCT 37.7* 34.4*  MCV 97.2 97.7  PLT 101* 86*    Basic Metabolic Panel: Recent Labs  Lab 05/15/23 1648 05/16/23 0416  NA 132* 132*  K 3.5 4.0  CL 101 101  CO2 25 27  GLUCOSE 97 167*  BUN 10 11  CREATININE 0.97 1.01  CALCIUM 8.2* 8.2*  MG  --  1.9    GFR: Estimated Creatinine Clearance: 84.3 mL/min (by C-G formula based on SCr of 1.01 mg/dL).  Liver Function Tests: Recent Labs  Lab 05/15/23 1648 05/16/23 0416  AST 71* 58*  ALT 68* 53*  ALKPHOS 88 74  BILITOT 1.5* 1.1  PROT 7.9 6.7  ALBUMIN 3.0* 2.5*    CBG: No results for input(s): "GLUCAP" in the last 168 hours.   Recent Results (from the past 240 hour(s))  Blood Culture (routine x 2)     Status: None (Preliminary result)   Collection Time: 05/15/23  4:40 PM   Specimen: BLOOD LEFT ARM  Result Value Ref Range Status   Specimen Description   Final    BLOOD LEFT ARM Performed at [facility], [address], [place], [place] [address]    Special Requests   Final    BOTTLES DRAWN AEROBIC AND ANAEROBIC Blood Culture results may not be optimal due to an inadequate volume of blood received in culture bottles Performed at [facility], [address], [place], [place] [address]    Culture   Final    NO GROWTH < 24 HOURS Performed at [facility], [address], [place], [place] [address]    Report Status PENDING  Incomplete  Blood Culture (routine x 2)     Status: None (Preliminary result)   Collection Time: 05/15/23  4:48 PM   Specimen:  BLOOD RIGHT ARM  Result Value Ref Range Status   Specimen Description   Final    BLOOD RIGHT ARM Performed at [facility], [address], [place], [place] [address]    Special Requests   Final    BOTTLES DRAWN AEROBIC AND ANAEROBIC Blood Culture results may not be optimal due to an excessive volume of blood received in culture bottles Performed at [facility], [address], [place], [place] [address]    Culture   Final    NO GROWTH < 24 HOURS Performed at [facility], [address], [place], [place] [address]    Report Status PENDING  Incomplete  Resp panel by RT-PCR (RSV, Flu Jasira Robinson&B, Covid) Urine, Clean Catch     Status: None   Collection Time: 05/15/23  5:42 PM   Specimen: Urine, Clean Catch; Nasal Swab  Result Value Ref Range Status   SARS Coronavirus 2 by RT PCR NEGATIVE NEGATIVE Final    Comment: (NOTE) SARS-CoV-2 target nucleic acids are NOT DETECTED.  The SARS-CoV-2 RNA is generally detectable in upper respiratory specimens during the acute phase of infection. The lowest concentration of SARS-CoV-2 viral copies this assay can detect is 138 copies/mL. Malory Spurr negative result does not preclude SARS-Cov-2 infection and should not be used as the sole basis for treatment or other patient management decisions. Leoncio Hansen negative result may occur with  improper specimen collection/handling, submission of specimen other than nasopharyngeal swab, presence of viral mutation(s) within the areas targeted by this assay, and inadequate number of viral copies(<138 copies/mL). Meryle Pugmire negative result must be combined with clinical observations, patient history,  and epidemiological information. The expected result is Negative.  Fact Sheet for Patients:  BloggerCourse.com  Fact Sheet for Healthcare Providers:  SeriousBroker.it  This test is no t yet approved or cleared by the [place] FDA and  has been authorized for  detection and/or diagnosis of SARS-CoV-2 by FDA under an Emergency Use Authorization (EUA). This EUA will remain  in effect (meaning this test can be used) for the duration of the COVID-19 declaration under Section 564(b)(1) of the Act, 21 U.S.C.section 360bbb-3(b)(1), unless the authorization is terminated  or revoked sooner.       Influenza Kalee Mcclenathan by PCR NEGATIVE NEGATIVE Final   Influenza B by PCR NEGATIVE NEGATIVE Final    Comment: (NOTE) The Xpert Xpress SARS-CoV-2/FLU/RSV plus assay is intended as an aid in the diagnosis of influenza from Nasopharyngeal swab specimens and should not be used as Laylah Riga sole basis for treatment. Nasal washings and aspirates are unacceptable for Xpert Xpress SARS-CoV-2/FLU/RSV testing.  Fact Sheet for Patients: BloggerCourse.com  Fact Sheet for Healthcare Providers: SeriousBroker.it  This test is not yet approved or cleared by the [place] FDA and has been authorized for detection and/or diagnosis of SARS-CoV-2 by FDA under an Emergency Use Authorization (EUA). This EUA will remain in effect (meaning this test can be used) for the duration of the COVID-19 declaration under Section 564(b)(1) of the Act, 21 U.S.C. section 360bbb-3(b)(1), unless the authorization is terminated or revoked.     Resp Syncytial Virus by PCR NEGATIVE NEGATIVE Final    Comment: (NOTE) Fact Sheet for Patients: BloggerCourse.com  Fact Sheet for Healthcare Providers: SeriousBroker.it  This test is not yet approved or cleared by the [place] FDA and has been authorized for detection and/or diagnosis of SARS-CoV-2 by FDA under an Emergency Use Authorization (EUA). This EUA will remain in effect (meaning this test can be used) for the duration of the COVID-19 declaration under Section 564(b)(1) of the Act, 21 U.S.C. section 360bbb-3(b)(1), unless the authorization is  terminated or revoked.  Performed at [facility], [address] [place], [place] [address]          Radiology Studies: CT KNEE RIGHT W CONTRAST  Result Date: 05/15/2023 CLINICAL DATA:  Cellulitis at right BKA stump, insect bite, swelling EXAM: CT OF THE RIGHT KNEE WITH CONTRAST TECHNIQUE: Multidetector CT imaging was performed following the standard protocol during bolus administration of intravenous contrast. RADIATION DOSE REDUCTION: This exam was performed according to the departmental dose-optimization program which includes automated exposure control, adjustment of the mA and/or kV according to patient size and/or use of iterative reconstruction technique. CONTRAST:  OMNIPAQUE IOHEXOL 300 MG/ML  SOLN COMPARISON:  05/15/2023 FINDINGS: Bones/Joint/Cartilage The bones are diffusely osteopenic. There are no areas of bony destruction or periosteal reaction to suggest osteomyelitis. Alignment of the right knee is anatomic. No right knee effusion. Ligaments Suboptimally assessed by CT. Muscles and Tendons No acute muscular abnormalities. No intramuscular fluid collection or abnormal enhancement. Soft tissues There is diffuse subcutaneous edema consistent with given history of cellulitis. No evidence of subcutaneous gas or fluid collection. No evidence of abscess. There is diffuse atherosclerosis. Reconstructed images demonstrate no additional findings. IMPRESSION: 1. Diffuse subcutaneous edema consistent with cellulitis. No fluid collection or abscess. 2. Diffuse osteopenia. No focal bony destruction or periosteal reaction to suggest osteomyelitis. Electronically Signed   By: Sharlet Salina M.D.   On: 05/15/2023 21:55   DG Knee Complete 4 Views Right  Result Date: 05/15/2023 CLINICAL DATA:  Right  stump cellulitis. Patient reports he was bit by some sort of insect 4 days ago. Currently on antibiotics. Soft tissue is hot below the bite and warmth above it. Stump swelling.  History of stump infections. EXAM: RIGHT KNEE - COMPLETE 4+ VIEW COMPARISON:  Right tibia and fibula radiographs 12/16/2022 FINDINGS: Postsurgical changes are again seen of amputation to the level of the proximal tibial and fibular diaphyses. Interval removal of the prior distal surgical skin staples. No definitive cortical erosion is seen, within limitation of distal postsurgical change. No subcutaneous air is seen. No acute fracture or dislocation. Mild chronic enthesopathic change at the quadriceps insertion on the patella. IMPRESSION: Postsurgical changes of amputation to the level of the proximal tibial and fibular diaphyses. The distal bone amputation sites appear fairly sharp, and no definitive radiographic evidence of osteomyelitis is seen. Electronically Signed   By: Neita Garnet M.D.   On: 05/15/2023 18:15   DG Chest Port 1 View  Result Date: 05/15/2023 CLINICAL DATA:  Questionable sepsis EXAM: PORTABLE CHEST 1 VIEW COMPARISON:  11/21/2022 FINDINGS: Cardiac and mediastinal contours are within normal limits. No focal pulmonary opacity. No pleural effusion or pneumothorax. No acute osseous abnormality. IMPRESSION: No acute cardiopulmonary process. Electronically Signed   By: Wiliam Ke M.D.   On: 05/15/2023 17:07        Scheduled Meds:  atorvastatin  40 mg Oral Daily   bictegravir-emtricitabine-tenofovir AF  1 tablet Oral Daily   gabapentin  300 mg Oral TID   ketorolac  15 mg Intravenous Q6H   sulfamethoxazole-trimethoprim  1 tablet Oral Daily   Continuous Infusions:  methocarbamol (ROBAXIN) IV     vancomycin 1,000 mg (05/16/23 0727)     LOS: 1 day    Time spent: over 30 min    Lacretia Nicks, MD [facility]   To contact the attending provider between 7A-7P or the covering provider during after hours 7P-7A, please log into the web site [email or web address] and access using universal [place] password for that web site. If you do not have the password, please call the  hospital operator.  05/16/2023, 4:00 PM
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
## 2023-05-18 DIAGNOSIS — A419 Sepsis, unspecified organism: Secondary | ICD-10-CM | POA: Diagnosis not present

## 2023-05-18 DIAGNOSIS — L0390 Cellulitis, unspecified: Secondary | ICD-10-CM | POA: Diagnosis not present

## 2023-05-18 LAB — CULTURE, BLOOD (ROUTINE X 2)

## 2023-05-18 LAB — COMPREHENSIVE METABOLIC PANEL
ALT: 57 U/L — ABNORMAL HIGH (ref 0–44)
AST: 70 U/L — ABNORMAL HIGH (ref 15–41)
Albumin: 2.3 g/dL — ABNORMAL LOW (ref 3.5–5.0)
Alkaline Phosphatase: 87 U/L (ref 38–126)
Anion gap: 7 (ref 5–15)
BUN: 10 mg/dL (ref 6–20)
CO2: 25 mmol/L (ref 22–32)
Calcium: 7.8 mg/dL — ABNORMAL LOW (ref 8.9–10.3)
Chloride: 101 mmol/L (ref 98–111)
Creatinine, Ser: 0.96 mg/dL (ref 0.61–1.24)
GFR, Estimated: >60 mL/min (ref >60)
Glucose, Bld: 114 mg/dL — ABNORMAL HIGH (ref 70–99)
Potassium: 3.5 mmol/L (ref 3.5–5.1)
Sodium: 133 mmol/L — ABNORMAL LOW (ref 135–145)
Total Bilirubin: 0.5 mg/dL (ref 0.3–1.2)
Total Protein: 6.7 g/dL (ref 6.5–8.1)

## 2023-05-18 LAB — CBC WITH DIFFERENTIAL/PLATELET
Abs Immature Granulocytes: 0.01 10*3/uL (ref 0.00–0.07)
Basophils Absolute: 0 10*3/uL (ref 0.0–0.1)
Basophils Relative: 0 %
Eosinophils Absolute: 0 10*3/uL (ref 0.0–0.5)
Eosinophils Relative: 1 %
HCT: 33.6 % — ABNORMAL LOW (ref 39.0–52.0)
Hemoglobin: 11.3 g/dL — ABNORMAL LOW (ref 13.0–17.0)
Immature Granulocytes: 0 %
Lymphocytes Relative: 25 %
Lymphs Abs: 1 10*3/uL (ref 0.7–4.0)
MCH: 32.2 pg (ref 26.0–34.0)
MCHC: 33.6 g/dL (ref 30.0–36.0)
MCV: 95.7 fL (ref 80.0–100.0)
Monocytes Absolute: 0.3 10*3/uL (ref 0.1–1.0)
Monocytes Relative: 8 %
Neutro Abs: 2.6 10*3/uL (ref 1.7–7.7)
Neutrophils Relative %: 66 %
Platelets: 118 10*3/uL — ABNORMAL LOW (ref 150–400)
RBC: 3.51 MIL/uL — ABNORMAL LOW (ref 4.22–5.81)
RDW: 12.8 % (ref 11.5–15.5)
WBC: 4 10*3/uL (ref 4.0–10.5)
nRBC: 0 % (ref 0.0–0.2)

## 2023-05-18 LAB — PHOSPHORUS: Phosphorus: 3.3 mg/dL (ref 2.5–4.6)

## 2023-05-18 LAB — MAGNESIUM: Magnesium: 1.8 mg/dL (ref 1.7–2.4)

## 2023-05-18 NOTE — Progress Notes (Signed)
Pharmacy Antibiotic Note  Curtis Hodge is a 57 y.o. male admitted on 05/15/2023 with  sepsis + cellulits .  Pharmacy was consulted for vancomycin dosing.  Also now on empiric ceftriaxone  Active Problems: sepsis with cellulitis, wound infection R BKA stump.  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 Goal AUC 400-550. Continue ceftriaxone 2 grams IV q 24 h Follow renal function, clinical course     Height: 5\' 10"  (177.8 cm) Weight: 77.1 kg (170 lb) IBW/kg (Calculated) : 73  Temp (24hrs), Avg:100 F (37.8 C), Min:99.7 F (37.6 C), Max:100.3 F (37.9 C)  Recent Labs  Lab 05/15/23 1648 05/16/23 0416 05/18/23 0458  WBC 7.9 7.1 4.0  CREATININE 0.97 1.01 0.96  LATICACIDVEN 1.7  --   --      Estimated Creatinine Clearance: 88.7 mL/min (by C-G formula based on SCr of 0.96 mg/dL).    No Known Allergies  Elie Goody, PharmD, BCPS Clinical Pharmacist 05/18/2023  11:38 AM

## 2023-05-18 NOTE — Progress Notes (Signed)
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   # RT knee BKA site cellulitis  In the setting of insect bite  No symptoms and signs of nec. Fasc CT 6/27 Diffuse subcutaneous edema consistent with cellulitis. No fluid collection or abscess. No focal bony destruction or periosteal reaction to suggest osteomyelitis. Initially on cefepime+ metro + vanc->ctx+ vanc on 6/28 Plan: Called today due to c/f worsening swelling at knee. Ortho engaged and not concerned for septic arthritis. F/U with Dr. Lajoyce Corners I looked at pt's knee today and compared to media images looks about the same. Continue vanc+ ctx and monitor clinically for now   #HIV , hXof non-adherence -On biktarvy and bactrim -CL 12.3k on 6/29 -Will get CD4  # Hepatitis B/Liver cirrhosis On Biktarvy  Needs HCC surveillance as well as Variceal screening   #Dirrhea-resolved  #HA -Denies today  #Mild transmaitinis 2/2 sepsis -stable Microbiology:   Antibiotics: Biktarvy and bactrim Ctx 6/28-  vanc  6/26- Cefepime 6/27-6/26 Cultures: Blood 6/27 NG    SUBJECTIVE: Pt is shaking right stump Interval: Tmax 100, wbc 4k  Review of Systems: Review of Systems  All other systems reviewed and are negative.    Scheduled Meds:  atorvastatin  40 mg Oral Daily   bictegravir-emtricitabine-tenofovir AF  1 tablet Oral Daily    gabapentin  300 mg Oral TID   ketorolac  15 mg Intravenous Q6H   pantoprazole  40 mg Oral Daily   sulfamethoxazole-trimethoprim  1 tablet Oral Daily   Continuous Infusions:  cefTRIAXone (ROCEPHIN)  IV 2 g (05/17/23 2055)   methocarbamol (ROBAXIN) IV     vancomycin 1,000 mg (05/18/23 0518)   PRN Meds:.acetaminophen **OR** acetaminophen, methocarbamol (ROBAXIN) IV, morphine injection, ondansetron **OR** ondansetron (ZOFRAN) IV, oxyCODONE No Known Allergies  OBJECTIVE: Vitals:   05/17/23 1305 05/17/23 1348 05/17/23 2021 05/18/23 0510  BP:  (!) 146/70 (!) 169/93 (!) 148/87  Pulse:  94 80 81  Resp:  18 18 17   Temp: 100 F (37.8 C) 100 F (37.8 C) 99.7 F (37.6 C) 100.3 F (37.9 C)  TempSrc: Oral Oral Oral Oral  SpO2:  99% 100% 97%  Weight:      Height:       Body mass index is 24.39 kg/m.  Physical Exam Constitutional:      General: He is not in acute distress.    Appearance: He is normal weight. He is not toxic-appearing.  HENT:     Head: Normocephalic and atraumatic.     Right Ear: External ear normal.     Left Ear: External ear normal.     Nose: No congestion or rhinorrhea.     Mouth/Throat:     Mouth: Mucous membranes are moist.     Pharynx: Oropharynx is clear.  Eyes:     Extraocular Movements:  Extraocular movements intact.     Conjunctiva/sclera: Conjunctivae normal.     Pupils: Pupils are equal, round, and reactive to light.  Cardiovascular:     Rate and Rhythm: Normal rate and regular rhythm.     Heart sounds: No murmur heard.    No friction rub. No gallop.  Pulmonary:     Effort: Pulmonary effort is normal.     Breath sounds: Normal breath sounds.  Abdominal:     General: Abdomen is flat. Bowel sounds are normal.     Palpations: Abdomen is soft.  Musculoskeletal:        General: No swelling.     Cervical back: Normal range of motion and neck supple.     Comments: Shaking right stump  Skin:    General: Skin is warm and dry.  Neurological:      General: No focal deficit present.     Mental Status: He is oriented to person, place, and time.  Psychiatric:        Mood and Affect: Mood normal.       Lab Results Lab Results  Component Value Date   WBC 4.0 05/18/2023   HGB 11.3 (L) 05/18/2023   HCT 33.6 (L) 05/18/2023   MCV 95.7 05/18/2023   PLT 118 (L) 05/18/2023    Lab Results  Component Value Date   CREATININE 0.96 05/18/2023   BUN 10 05/18/2023   NA 133 (L) 05/18/2023   K 3.5 05/18/2023   CL 101 05/18/2023   CO2 25 05/18/2023    Lab Results  Component Value Date   ALT 57 (H) 05/18/2023   AST 70 (H) 05/18/2023   ALKPHOS 87 05/18/2023   BILITOT 0.5 05/18/2023        Danelle Earthly, MD [facility] 05/18/2023, 10:36 AM   I have personally spent 54 minutes involved in face-to-face and non-face-to-face activities for this patient on the day of the visit. Professional time spent includes the following activities: Preparing to see the patient (review of tests), Obtaining and/or reviewing separately obtained history (admission/discharge record), Performing a medically appropriate examination and/or evaluation , Ordering medications/tests/procedures, referring and communicating with other health care professionals, Documenting clinical information in the EMR, Independently interpreting results (not separately reported), Communicating results to the patient/family/caregiver, Counseling and educating the patient/family/caregiver and Care coordination (not separately reported).

## 2023-05-18 NOTE — Progress Notes (Signed)
PROGRESS NOTE    Curtis Hodge  ZOX:[id number] DOB: [birth year] DOA: 05/15/2023 PCP: Hoy Register, MD  Chief Complaint  Patient presents with   Insect Bite    Brief Narrative:   Curtis Hodge is Curtis Hodge 57 y.o. male with medical history significant of COPD, hepatitis, HIV, schizophrenia,  substance use disorder, hx of OM s/p right BKA in 11/2022 who presented with concerns for infection in his BKA stump after he had an insect bite on Sunday. He got bit by something on his knee and noticed his stump started to swell that night. It progressively got worse, more red and painful.  He had some drainage from the bite, but none recently.  He decided to call EMS b/c he felt so bad and the pain was bad.  He had no idea he had Curtis Hodge fever until he got here. No fevers at home, but he never took his temperature. He denies any chills. No N/V, but has had diarrhea that started two days ago. He denies any blood in his stool. No stomach pain. He has had about 3 episodes of diarrhea/day. It is loose, not watery. No other sick contacts.    Denies any fever/chills, vision change+headaches, chest pain or palpitations, shortness of breath or cough, abdominal pain, N/V, dysuria or leg swelling.    Saw ortho on 6/25. Note not done, looks like started on keflex. Unsure if he is taking.      He smokes 1/2 PPD. Does not drink alcohol. Occasional MJ, denies drug use.   Assessment & Plan:   Principal Problem:   Sepsis due to cellulitis [facility]) Active Problems:   Diarrhea   Thrombocytopenia (HCC)   Chronic hepatitis B without delta agent without hepatic coma (HCC)   HIV disease (HCC)   Bullous emphysema (HCC)   Substance abuse (HCC)   Hyperlipidemia  Sepsis due to cellulitis of right lower extremity 57 year old presenting with five day history of worsening swelling, pain and redness to knee and stump of his right BKA after getting bit by insect meeting sepsis criteria with fever to 102, tachycardia and  leukocytosis  - saw ortho 6/25, got rx for keflex - failed keflex - CT R knee with diffuse subcutaneous edema c/w cellulitis (no fluid collection or abscess) - no evidence osteomyelitis - CRP 10.7, sed rate 39 - uric acid low - more knee swelling today on my exam, appreciate orthopedics evaluation -> low suspicion for septic arthritis  - follow blood cultures - Ngx3 days - continue broad spectrum abx with vanc given reported purulent drainage - scheduled toradol, prn oxy/morphine - bowel regimen - appreciate ID assistance in setting of HIV infection  Tremor - due to pain, follow  - seems improved   Diarrhea Resolved, will hold GI path and c diff for now   Thrombocytopenia (HCC) Likely due to sepsis vs. Liver disease Typically wnl at baseline Continue to monitor    Chronic hepatitis B without delta agent without hepatic coma (HCC) Liver enzymes stable at baseline.   Platelet low likely in setting of sepsis/infection  INR wnl    HIV disease (HCC) Followed by ID HIV quant 21,300 Pending CD4 count Decreased CD4 (195 in March 2024) Continue Biktarvy and bactrim    Bullous emphysema (HCC) Stable. No compliant with inhalers    Substance abuse (HCC) Check UDS (cocaine, THC) Declines nicotine patch    Hyperlipidemia Continue crestor, LFTs stable      DVT prophylaxis: SCD Code Status: full Family Communication:  none Disposition:   Status is: Inpatient Remains inpatient appropriate because: need for IV abx   Consultants:  ID  Procedures:  none  Antimicrobials:  Anti-infectives (From admission, onward)    Start     Dose/Rate Route Frequency Ordered Stop   05/16/23 2000  cefTRIAXone (ROCEPHIN) 2 g in sodium chloride 0.9 % 100 mL IVPB        2 g 200 mL/hr over 30 Minutes Intravenous Every 24 hours 05/16/23 1835     05/16/23 1000  sulfamethoxazole-trimethoprim (BACTRIM DS) 800-160 MG per tablet 1 tablet        1 tablet Oral Daily 05/15/23 2059     05/16/23 0600   metroNIDAZOLE (FLAGYL) IVPB 500 mg  Status:  Discontinued        500 mg 100 mL/hr over 60 Minutes Intravenous Every 12 hours 05/15/23 2026 05/16/23 0746   05/16/23 0600  vancomycin (VANCOCIN) IVPB 1000 mg/200 mL premix        1,000 mg 200 mL/hr over 60 Minutes Intravenous Every 12 hours 05/15/23 2035     05/16/23 0130  ceFEPIme (MAXIPIME) 1 g in sodium chloride 0.9 % 100 mL IVPB  Status:  Discontinued        1 g 200 mL/hr over 30 Minutes Intravenous Every 8 hours 05/15/23 2035 05/15/23 2037   05/16/23 0130  ceFEPIme (MAXIPIME) 2 g in sodium chloride 0.9 % 100 mL IVPB  Status:  Discontinued        2 g 200 mL/hr over 30 Minutes Intravenous Every 8 hours 05/15/23 2037 05/16/23 1532   05/15/23 2145  bictegravir-emtricitabine-tenofovir AF (BIKTARVY) 50-200-25 MG per tablet 1 tablet        1 tablet Oral Daily 05/15/23 2059     05/15/23 1700  vancomycin (VANCOREADY) IVPB 1750 mg/350 mL        1,750 mg 175 mL/hr over 120 Minutes Intravenous  Once 05/15/23 1650 05/15/23 2021   05/15/23 1645  ceFEPIme (MAXIPIME) 2 g in sodium chloride 0.9 % 100 mL IVPB        2 g 200 mL/hr over 30 Minutes Intravenous  Once 05/15/23 1644 05/15/23 1741   05/15/23 1645  metroNIDAZOLE (FLAGYL) IVPB 500 mg        500 mg 100 mL/hr over 60 Minutes Intravenous  Once 05/15/23 1644 05/15/23 1857   05/15/23 1645  vancomycin (VANCOCIN) IVPB 1000 mg/200 mL premix  Status:  Discontinued        1,000 mg 200 mL/hr over 60 Minutes Intravenous  Once 05/15/23 1644 05/15/23 1650       Subjective: No new complaints  Objective: Vitals:   05/17/23 1348 05/17/23 2021 05/18/23 0510 05/18/23 1527  BP: (!) 146/70 (!) 169/93 (!) 148/87 (!) 136/95  Pulse: 94 80 81 79  Resp: 18 18 17 20   Temp: 100 F (37.8 C) 99.7 F (37.6 C) 100.3 F (37.9 C) 98.2 F (36.8 C)  TempSrc: Oral Oral Oral Oral  SpO2: 99% 100% 97% 100%  Weight:      Height:        Intake/Output Summary (Last 24 hours) at 05/18/2023 1641 Last data filed at  05/18/2023 1527 Gross per 24 hour  Intake 680.93 ml  Output 2100 ml  Net -1419.07 ml   Filed Weights   05/15/23 1552 05/15/23 1702  Weight: 77.1 kg 77.1 kg    Examination:  General: No acute distress. Cardiovascular: RRR Lungs: unlabored Neurological: Alert and oriented 3. Moves all extremities 4. Cranial nerves II through XII  grossly intact. Skin: Warm and dry. No rashes or lesions. Extremities:  continued swelling, though tenderness to RLE/stump is improving   Data Reviewed: I have personally reviewed following labs and imaging studies  CBC: Recent Labs  Lab 05/15/23 1648 05/16/23 0416 05/18/23 0458  WBC 7.9 7.1 4.0  NEUTROABS 6.4  --  2.6  HGB 12.5* 11.5* 11.3*  HCT 37.7* 34.4* 33.6*  MCV 97.2 97.7 95.7  PLT 101* 86* 118*    Basic Metabolic Panel: Recent Labs  Lab 05/15/23 1648 05/16/23 0416 05/18/23 0458  NA 132* 132* 133*  K 3.5 4.0 3.5  CL 101 101 101  CO2 25 27 25   GLUCOSE 97 167* 114*  BUN 10 11 10   CREATININE 0.97 1.01 0.96  CALCIUM 8.2* 8.2* 7.8*  MG  --  1.9 1.8  PHOS  --   --  3.3    GFR: Estimated Creatinine Clearance: 88.7 mL/min (by C-G formula based on SCr of 0.96 mg/dL).  Liver Function Tests: Recent Labs  Lab 05/15/23 1648 05/16/23 0416 05/18/23 0458  AST 71* 58* 70*  ALT 68* 53* 57*  ALKPHOS 88 74 87  BILITOT 1.5* 1.1 0.5  PROT 7.9 6.7 6.7  ALBUMIN 3.0* 2.5* 2.3*    CBG: No results for input(s): "GLUCAP" in the last 168 hours.   Recent Results (from the past 240 hour(s))  Blood Culture (routine x 2)     Status: None (Preliminary result)   Collection Time: 05/15/23  4:40 PM   Specimen: BLOOD LEFT ARM  Result Value Ref Range Status   Specimen Description   Final    BLOOD LEFT ARM Performed at [facility], [address], [place], [place] [address]    Special Requests   Final    BOTTLES DRAWN AEROBIC AND ANAEROBIC Blood Culture results may not be optimal due to an inadequate volume of blood received in  culture bottles Performed at [facility], [address] [place], [place] [address]    Culture   Final    NO GROWTH 3 DAYS Performed at [facility], [address] [place], [place] [address]    Report Status PENDING  Incomplete  Blood Culture (routine x 2)     Status: None (Preliminary result)   Collection Time: 05/15/23  4:48 PM   Specimen: BLOOD RIGHT ARM  Result Value Ref Range Status   Specimen Description   Final    BLOOD RIGHT ARM Performed at [facility], [address], [place], [place] [address]    Special Requests   Final    BOTTLES DRAWN AEROBIC AND ANAEROBIC Blood Culture results may not be optimal due to an excessive volume of blood received in culture bottles Performed at [facility], [address] [place], [place] [address]    Culture   Final    NO GROWTH 3 DAYS Performed at [facility], [address], [place], [place] [address]    Report Status PENDING  Incomplete  Resp panel by RT-PCR (RSV, Flu Marsella Suman&B, Covid) Urine, Clean Catch     Status: None   Collection Time: 05/15/23  5:42 PM   Specimen: Urine, Clean Catch; Nasal Swab  Result Value Ref Range Status   SARS Coronavirus 2 by RT PCR NEGATIVE NEGATIVE Final    Comment: (NOTE) SARS-CoV-2 target nucleic acids are NOT DETECTED.  The SARS-CoV-2 RNA is generally detectable in upper respiratory specimens during the acute phase of infection. The  lowest concentration of SARS-CoV-2 viral copies this assay can detect is 138 copies/mL. Curtis Hodge negative result does not preclude SARS-Cov-2 infection and should not be used as the sole basis for treatment or other patient management decisions. Curtis Hodge negative result may occur with  improper specimen collection/handling, submission of specimen other than nasopharyngeal swab, presence of viral mutation(s) within the areas targeted by this assay, and inadequate number of viral copies(<138 copies/mL). Curtis Hodge negative result must  be combined with clinical observations, patient history, and epidemiological information. The expected result is Negative.  Fact Sheet for Patients:  BloggerCourse.com  Fact Sheet for Healthcare Providers:  SeriousBroker.it  This test is no t yet approved or cleared by the [place] FDA and  has been authorized for detection and/or diagnosis of SARS-CoV-2 by FDA under an Emergency Use Authorization (EUA). This EUA will remain  in effect (meaning this test can be used) for the duration of the COVID-19 declaration under Section 564(b)(1) of the Act, 21 U.S.C.section 360bbb-3(b)(1), unless the authorization is terminated  or revoked sooner.       Influenza Curtis Hodge by PCR NEGATIVE NEGATIVE Final   Influenza B by PCR NEGATIVE NEGATIVE Final    Comment: (NOTE) The Xpert Xpress SARS-CoV-2/FLU/RSV plus assay is intended as an aid in the diagnosis of influenza from Nasopharyngeal swab specimens and should not be used as Curtis Hodge sole basis for treatment. Nasal washings and aspirates are unacceptable for Xpert Xpress SARS-CoV-2/FLU/RSV testing.  Fact Sheet for Patients: BloggerCourse.com  Fact Sheet for Healthcare Providers: SeriousBroker.it  This test is not yet approved or cleared by the [place] FDA and has been authorized for detection and/or diagnosis of SARS-CoV-2 by FDA under an Emergency Use Authorization (EUA). This EUA will remain in effect (meaning this test can be used) for the duration of the COVID-19 declaration under Section 564(b)(1) of the Act, 21 U.S.C. section 360bbb-3(b)(1), unless the authorization is terminated or revoked.     Resp Syncytial Virus by PCR NEGATIVE NEGATIVE Final    Comment: (NOTE) Fact Sheet for Patients: BloggerCourse.com  Fact Sheet for Healthcare Providers: SeriousBroker.it  This test is not  yet approved or cleared by the [place] FDA and has been authorized for detection and/or diagnosis of SARS-CoV-2 by FDA under an Emergency Use Authorization (EUA). This EUA will remain in effect (meaning this test can be used) for the duration of the COVID-19 declaration under Section 564(b)(1) of the Act, 21 U.S.C. section 360bbb-3(b)(1), unless the authorization is terminated or revoked.  Performed at [facility], [address] [place], [place] [address]   MRSA Next Gen by PCR, Nasal     Status: Abnormal   Collection Time: 05/17/23 11:39 AM   Specimen: Nasal Mucosa; Nasal Swab  Result Value Ref Range Status   MRSA by PCR Next Gen DETECTED (Curtis Hodge) NOT DETECTED Final    Comment: (NOTE) The GeneXpert MRSA Assay (FDA approved for NASAL specimens only), is one component of Curtis Hodge comprehensive MRSA colonization surveillance program. It is not intended to diagnose MRSA infection nor to guide or monitor treatment for MRSA infections. Test performance is not FDA approved in patients less than 40 years old. Performed at [facility], [address], [place], [place] [address]          Radiology Studies: No results found.      Scheduled Meds:  atorvastatin  40 mg Oral Daily   bictegravir-emtricitabine-tenofovir AF  1 tablet Oral Daily   gabapentin  300 mg Oral TID  ketorolac  15 mg Intravenous Q6H   pantoprazole  40 mg Oral Daily   sulfamethoxazole-trimethoprim  1 tablet Oral Daily   Continuous Infusions:  cefTRIAXone (ROCEPHIN)  IV 2 g (05/17/23 2055)   methocarbamol (ROBAXIN) IV     vancomycin 1,000 mg (05/18/23 0518)     LOS: 3 days    Time spent: over 30 min    Lacretia Nicks, MD [facility]   To contact the attending provider between 7A-7P or the covering provider during after hours 7P-7A, please log into the web site [email or web address] and access using universal [place] password for that web site. If you do not  have the password, please call the hospital operator.  05/18/2023, 4:41 PM

## 2023-05-18 NOTE — Plan of Care (Signed)

## 2023-05-19 ENCOUNTER — Inpatient Hospital Stay (HOSPITAL_COMMUNITY): Payer: 59

## 2023-05-19 DIAGNOSIS — F329 Major depressive disorder, single episode, unspecified: Secondary | ICD-10-CM

## 2023-05-19 DIAGNOSIS — B181 Chronic viral hepatitis B without delta-agent: Secondary | ICD-10-CM

## 2023-05-19 DIAGNOSIS — A419 Sepsis, unspecified organism: Secondary | ICD-10-CM | POA: Diagnosis not present

## 2023-05-19 DIAGNOSIS — F1910 Other psychoactive substance abuse, uncomplicated: Secondary | ICD-10-CM

## 2023-05-19 DIAGNOSIS — D696 Thrombocytopenia, unspecified: Secondary | ICD-10-CM

## 2023-05-19 DIAGNOSIS — L0390 Cellulitis, unspecified: Secondary | ICD-10-CM | POA: Diagnosis not present

## 2023-05-19 LAB — PHOSPHORUS: Phosphorus: 3.1 mg/dL (ref 2.5–4.6)

## 2023-05-19 LAB — T-HELPER CELLS (CD4) COUNT (NOT AT ARMC)
CD4 % Helper T Cell: 15 % — ABNORMAL LOW (ref 33–65)
CD4 T Cell Abs: 177 /uL — ABNORMAL LOW (ref 400–1790)

## 2023-05-19 LAB — CBC WITH DIFFERENTIAL/PLATELET
Abs Immature Granulocytes: 0.01 10*3/uL (ref 0.00–0.07)
Basophils Absolute: 0 10*3/uL (ref 0.0–0.1)
Basophils Relative: 0 %
Eosinophils Absolute: 0 10*3/uL (ref 0.0–0.5)
Eosinophils Relative: 1 %
HCT: 35.4 % — ABNORMAL LOW (ref 39.0–52.0)
Hemoglobin: 11.7 g/dL — ABNORMAL LOW (ref 13.0–17.0)
Immature Granulocytes: 0 %
Lymphocytes Relative: 23 %
Lymphs Abs: 1.2 10*3/uL (ref 0.7–4.0)
MCH: 32.6 pg (ref 26.0–34.0)
MCHC: 33.1 g/dL (ref 30.0–36.0)
MCV: 98.6 fL (ref 80.0–100.0)
Monocytes Absolute: 0.4 10*3/uL (ref 0.1–1.0)
Monocytes Relative: 9 %
Neutro Abs: 3.3 10*3/uL (ref 1.7–7.7)
Neutrophils Relative %: 67 %
Platelets: 127 10*3/uL — ABNORMAL LOW (ref 150–400)
RBC: 3.59 MIL/uL — ABNORMAL LOW (ref 4.22–5.81)
RDW: 12.9 % (ref 11.5–15.5)
WBC: 5 10*3/uL (ref 4.0–10.5)
nRBC: 0 % (ref 0.0–0.2)

## 2023-05-19 LAB — COMPREHENSIVE METABOLIC PANEL
ALT: 58 U/L — ABNORMAL HIGH (ref 0–44)
AST: 62 U/L — ABNORMAL HIGH (ref 15–41)
Albumin: 2.4 g/dL — ABNORMAL LOW (ref 3.5–5.0)
Alkaline Phosphatase: 102 U/L (ref 38–126)
Anion gap: 6 (ref 5–15)
BUN: 10 mg/dL (ref 6–20)
CO2: 26 mmol/L (ref 22–32)
Calcium: 8 mg/dL — ABNORMAL LOW (ref 8.9–10.3)
Chloride: 103 mmol/L (ref 98–111)
Creatinine, Ser: 1.08 mg/dL (ref 0.61–1.24)
GFR, Estimated: >60 mL/min (ref >60)
Glucose, Bld: 123 mg/dL — ABNORMAL HIGH (ref 70–99)
Potassium: 3.9 mmol/L (ref 3.5–5.1)
Sodium: 135 mmol/L (ref 135–145)
Total Bilirubin: 0.4 mg/dL (ref 0.3–1.2)
Total Protein: 6.9 g/dL (ref 6.5–8.1)

## 2023-05-19 LAB — CULTURE, BLOOD (ROUTINE X 2): Culture: NO GROWTH

## 2023-05-19 LAB — MAGNESIUM: Magnesium: 1.9 mg/dL (ref 1.7–2.4)

## 2023-05-19 LAB — VANCOMYCIN, PEAK: Vancomycin Pk: 25 ug/mL — ABNORMAL LOW (ref 30–40)

## 2023-05-19 MED ORDER — MUPIROCIN 2 % EX OINT
1.0000 | TOPICAL_OINTMENT | Freq: Two times a day (BID) | CUTANEOUS | Status: AC
  Administered 2023-05-19 – 2023-05-24 (×8): 1 via NASAL
  Filled 2023-05-19 (×2): qty 22

## 2023-05-19 MED ORDER — CHLORHEXIDINE GLUCONATE CLOTH 2 % EX PADS
6.0000 | MEDICATED_PAD | Freq: Every day | CUTANEOUS | Status: AC
  Administered 2023-05-21 – 2023-05-24 (×3): 6 via TOPICAL

## 2023-05-19 MED ORDER — GADOBUTROL 1 MMOL/ML IV SOLN
7.5000 mL | Freq: Once | INTRAVENOUS | Status: AC | PRN
  Administered 2023-05-19: 7.5 mL via INTRAVENOUS

## 2023-05-19 NOTE — Progress Notes (Addendum)
PROGRESS NOTE    Curtis Hodge  ZOX:[id number] DOB: 27-Feb-1966 DOA: 05/15/2023 PCP: Hoy Register, MD  Chief Complaint  Patient presents with   Insect Bite    Brief Narrative:   Curtis Hodge is Curtis Hodge 57 y.o. male with medical history significant of COPD, hepatitis, HIV, schizophrenia,  substance use disorder, hx of OM s/p right BKA in 11/2022 who presented with concerns for infection in his BKA stump after he had an insect bite on Sunday. He got bit by something on his knee and noticed his stump started to swell that night. It progressively got worse, more red and painful.  He had some drainage from the bite, but none recently.  He decided to call EMS b/c he felt so bad and the pain was bad.  He had no idea he had Jacon Whetzel fever until he got here. No fevers at home, but he never took his temperature. He denies any chills. No N/V, but has had diarrhea that started two days ago. He denies any blood in his stool. No stomach pain. He has had about 3 episodes of diarrhea/day. It is loose, not watery. No other sick contacts.    Denies any fever/chills, vision change+headaches, chest pain or palpitations, shortness of breath or cough, abdominal pain, N/V, dysuria or leg swelling.    Saw ortho on 6/25. Note not done, looks like started on keflex. Unsure if he is taking.      He smokes 1/2 PPD. Does not drink alcohol. Occasional MJ, denies drug use.   Assessment & Plan:   Principal Problem:   Sepsis due to cellulitis [facility] Active Problems:   Diarrhea   Thrombocytopenia (HCC)   Chronic hepatitis B without delta agent without hepatic coma (HCC)   HIV disease (HCC)   Bullous emphysema (HCC)   Substance abuse (HCC)   Hyperlipidemia  Sepsis due to cellulitis of right lower extremity 57 year old presenting with five day history of worsening swelling, pain and redness to knee and stump of his right BKA after getting bit by insect meeting sepsis criteria with fever to 102, tachycardia and  leukocytosis  - saw ortho 6/25, got rx for keflex - failed keflex - CT R knee with diffuse subcutaneous edema c/w cellulitis (no fluid collection or abscess) - no evidence osteomyelitis - CRP 10.7, sed rate 39 - uric acid low - more knee swelling today on my exam, appreciate orthopedics evaluation -> low suspicion for septic arthritis  - follow blood cultures - Ngx3 days - continue broad spectrum abx with vanc given reported purulent drainage - scheduled toradol, prn oxy/morphine - bowel regimen - appreciate ID assistance in setting of HIV infection - MRI stump pending  Tremor - due to pain, follow  - seems improved   Diarrhea Resolved, will hold GI path and c diff for now   Thrombocytopenia (HCC) Likely due to sepsis vs. Liver disease Typically wnl at baseline Continue to monitor    Chronic hepatitis B without delta agent without hepatic coma (HCC) Liver enzymes stable at baseline.   Platelet low likely in setting of sepsis/infection  INR wnl    HIV disease (HCC) Followed by ID HIV quant 21,300 Pending CD4 count Decreased CD4 (195 in March 2024) Continue Biktarvy and bactrim    Bullous emphysema (HCC) Stable. No compliant with inhalers    Substance abuse (HCC) Check UDS (cocaine, THC) Declines nicotine patch    Hyperlipidemia Continue crestor, LFTs stable      DVT prophylaxis: SCD Code  Status: full Family Communication: none Disposition:   Status is: Inpatient Remains inpatient appropriate because: need for IV abx   Consultants:  ID  Procedures:  none  Antimicrobials:  Anti-infectives (From admission, onward)    Start     Dose/Rate Route Frequency Ordered Stop   05/16/23 2000  cefTRIAXone (ROCEPHIN) 2 g in sodium chloride 0.9 % 100 mL IVPB        2 g 200 mL/hr over 30 Minutes Intravenous Every 24 hours 05/16/23 1835     05/16/23 1000  sulfamethoxazole-trimethoprim (BACTRIM DS) 800-160 MG per tablet 1 tablet        1 tablet Oral Daily 05/15/23  2059     05/16/23 0600  metroNIDAZOLE (FLAGYL) IVPB 500 mg  Status:  Discontinued        500 mg 100 mL/hr over 60 Minutes Intravenous Every 12 hours 05/15/23 2026 05/16/23 0746   05/16/23 0600  vancomycin (VANCOCIN) IVPB 1000 mg/200 mL premix        1,000 mg 200 mL/hr over 60 Minutes Intravenous Every 12 hours 05/15/23 2035     05/16/23 0130  ceFEPIme (MAXIPIME) 1 g in sodium chloride 0.9 % 100 mL IVPB  Status:  Discontinued        1 g 200 mL/hr over 30 Minutes Intravenous Every 8 hours 05/15/23 2035 05/15/23 2037   05/16/23 0130  ceFEPIme (MAXIPIME) 2 g in sodium chloride 0.9 % 100 mL IVPB  Status:  Discontinued        2 g 200 mL/hr over 30 Minutes Intravenous Every 8 hours 05/15/23 2037 05/16/23 1532   05/15/23 2145  bictegravir-emtricitabine-tenofovir AF (BIKTARVY) 50-200-25 MG per tablet 1 tablet        1 tablet Oral Daily 05/15/23 2059     05/15/23 1700  vancomycin (VANCOREADY) IVPB 1750 mg/350 mL        1,750 mg 175 mL/hr over 120 Minutes Intravenous  Once 05/15/23 1650 05/15/23 2021   05/15/23 1645  ceFEPIme (MAXIPIME) 2 g in sodium chloride 0.9 % 100 mL IVPB        2 g 200 mL/hr over 30 Minutes Intravenous  Once 05/15/23 1644 05/15/23 1741   05/15/23 1645  metroNIDAZOLE (FLAGYL) IVPB 500 mg        500 mg 100 mL/hr over 60 Minutes Intravenous  Once 05/15/23 1644 05/15/23 1857   05/15/23 1645  vancomycin (VANCOCIN) IVPB 1000 mg/200 mL premix  Status:  Discontinued        1,000 mg 200 mL/hr over 60 Minutes Intravenous  Once 05/15/23 1644 05/15/23 1650       Subjective: Frustrated, asking about motorized wheelchair  Objective: Vitals:   05/18/23 1527 05/18/23 2001 05/19/23 0359 05/19/23 1309  BP: (!) 136/95 (!) 158/84 136/86 (!) 152/93  Pulse: 79 73 78 (!) 40  Resp: 20 18 18 16   Temp: 98.2 F (36.8 C) 98.9 F (37.2 C) 99.6 F (37.6 C) 98.8 F (37.1 C)  TempSrc: Oral Oral Oral Oral  SpO2: 100% 100% 100% 100%  Weight:      Height:        Intake/Output Summary  (Last 24 hours) at 05/19/2023 1426 Last data filed at 05/19/2023 0914 Gross per 24 hour  Intake 1295 ml  Output 1950 ml  Net -655 ml   Filed Weights   05/15/23 1552 05/15/23 1702  Weight: 77.1 kg 77.1 kg    Examination:  General: No acute distress. Cardiovascular: RRR Lungs: unlabored Neurological: Alert and oriented 3. Moves all extremities  4 with equal strength. Cranial nerves II through XII grossly intact. Extremities: continued edema to stump    Data Reviewed: I have personally reviewed following labs and imaging studies  CBC: Recent Labs  Lab 05/15/23 1648 05/16/23 0416 05/18/23 0458 05/19/23 0343  WBC 7.9 7.1 4.0 5.0  NEUTROABS 6.4  --  2.6 3.3  HGB 12.5* 11.5* 11.3* 11.7*  HCT 37.7* 34.4* 33.6* 35.4*  MCV 97.2 97.7 95.7 98.6  PLT 101* 86* 118* 127*    Basic Metabolic Panel: Recent Labs  Lab 05/15/23 1648 05/16/23 0416 05/18/23 0458 05/19/23 0343  NA 132* 132* 133* 135  K 3.5 4.0 3.5 3.9  CL 101 101 101 103  CO2 25 27 25 26   GLUCOSE 97 167* 114* 123*  BUN 10 11 10 10   CREATININE 0.97 1.01 0.96 1.08  CALCIUM 8.2* 8.2* 7.8* 8.0*  MG  --  1.9 1.8 1.9  PHOS  --   --  3.3 3.1    GFR: Estimated Creatinine Clearance: 78.9 mL/min (by C-G formula based on SCr of 1.08 mg/dL).  Liver Function Tests: Recent Labs  Lab 05/15/23 1648 05/16/23 0416 05/18/23 0458 05/19/23 0343  AST 71* 58* 70* 62*  ALT 68* 53* 57* 58*  ALKPHOS 88 74 87 102  BILITOT 1.5* 1.1 0.5 0.4  PROT 7.9 6.7 6.7 6.9  ALBUMIN 3.0* 2.5* 2.3* 2.4*    CBG: No results for input(s): "GLUCAP" in the last 168 hours.   Recent Results (from the past 240 hour(s))  Blood Culture (routine x 2)     Status: None (Preliminary result)   Collection Time: 05/15/23  4:40 PM   Specimen: BLOOD LEFT ARM  Result Value Ref Range Status   Specimen Description   Final    BLOOD LEFT ARM Performed at [facility], [address] [place], [place] [address]    Special Requests   Final     BOTTLES DRAWN AEROBIC AND ANAEROBIC Blood Culture results may not be optimal due to an inadequate volume of blood received in culture bottles Performed at [facility], [address] [place], [place] [address]    Culture   Final    NO GROWTH 4 DAYS Performed at [facility], [address] [place], [place] [address]    Report Status PENDING  Incomplete  Blood Culture (routine x 2)     Status: None (Preliminary result)   Collection Time: 05/15/23  4:48 PM   Specimen: BLOOD RIGHT ARM  Result Value Ref Range Status   Specimen Description   Final    BLOOD RIGHT ARM Performed at [facility], [address] [place], [place] [address]    Special Requests   Final    BOTTLES DRAWN AEROBIC AND ANAEROBIC Blood Culture results may not be optimal due to an excessive volume of blood received in culture bottles Performed at [facility], [address], [place], [place] [address]    Culture   Final    NO GROWTH 4 DAYS Performed at [facility], [address], [place], [place] [address]    Report Status PENDING  Incomplete  Resp panel by RT-PCR (RSV, Flu Reyli Schroth&B, Covid) Urine, Clean Catch     Status: None   Collection Time: 05/15/23  5:42 PM   Specimen: Urine, Clean Catch; Nasal Swab  Result Value Ref Range Status   SARS Coronavirus 2 by RT PCR NEGATIVE NEGATIVE Final    Comment: (NOTE)  SARS-CoV-2 target nucleic acids are NOT DETECTED.  The SARS-CoV-2 RNA is generally detectable in upper respiratory specimens during the acute phase of infection. The lowest concentration of SARS-CoV-2 viral copies this assay can detect is 138 copies/mL. Yuriko Portales negative result does not preclude SARS-Cov-2 infection and should not be used as the sole basis for treatment or other patient management decisions. Ruston Fedora negative result may occur with  improper specimen collection/handling, submission of specimen other than nasopharyngeal swab, presence of viral  mutation(s) within the areas targeted by this assay, and inadequate number of viral copies(<138 copies/mL). Sarye Kath negative result must be combined with clinical observations, patient history, and epidemiological information. The expected result is Negative.  Fact Sheet for Patients:  BloggerCourse.com  Fact Sheet for Healthcare Providers:  SeriousBroker.it  This test is no t yet approved or cleared by the [place] FDA and  has been authorized for detection and/or diagnosis of SARS-CoV-2 by FDA under an Emergency Use Authorization (EUA). This EUA will remain  in effect (meaning this test can be used) for the duration of the COVID-19 declaration under Section 564(b)(1) of the Act, 21 U.S.C.section 360bbb-3(b)(1), unless the authorization is terminated  or revoked sooner.       Influenza Batya Citron by PCR NEGATIVE NEGATIVE Final   Influenza B by PCR NEGATIVE NEGATIVE Final    Comment: (NOTE) The Xpert Xpress SARS-CoV-2/FLU/RSV plus assay is intended as an aid in the diagnosis of influenza from Nasopharyngeal swab specimens and should not be used as Mahogany Torrance sole basis for treatment. Nasal washings and aspirates are unacceptable for Xpert Xpress SARS-CoV-2/FLU/RSV testing.  Fact Sheet for Patients: BloggerCourse.com  Fact Sheet for Healthcare Providers: SeriousBroker.it  This test is not yet approved or cleared by the [place] FDA and has been authorized for detection and/or diagnosis of SARS-CoV-2 by FDA under an Emergency Use Authorization (EUA). This EUA will remain in effect (meaning this test can be used) for the duration of the COVID-19 declaration under Section 564(b)(1) of the Act, 21 U.S.C. section 360bbb-3(b)(1), unless the authorization is terminated or revoked.     Resp Syncytial Virus by PCR NEGATIVE NEGATIVE Final    Comment: (NOTE) Fact Sheet for  Patients: BloggerCourse.com  Fact Sheet for Healthcare Providers: SeriousBroker.it  This test is not yet approved or cleared by the [place] FDA and has been authorized for detection and/or diagnosis of SARS-CoV-2 by FDA under an Emergency Use Authorization (EUA). This EUA will remain in effect (meaning this test can be used) for the duration of the COVID-19 declaration under Section 564(b)(1) of the Act, 21 U.S.C. section 360bbb-3(b)(1), unless the authorization is terminated or revoked.  Performed at [facility], [address] [place], [place] [address]   MRSA Next Gen by PCR, Nasal     Status: Abnormal   Collection Time: 05/17/23 11:39 AM   Specimen: Nasal Mucosa; Nasal Swab  Result Value Ref Range Status   MRSA by PCR Next Gen DETECTED (Joycie Aerts) NOT DETECTED Final    Comment: (NOTE) The GeneXpert MRSA Assay (FDA approved for NASAL specimens only), is one component of Geordan Xu comprehensive MRSA colonization surveillance program. It is not intended to diagnose MRSA infection nor to guide or monitor treatment for MRSA infections. Test performance is not FDA approved in patients less than 8 years old. Performed at [facility], [address], [place], [place] [address]          Radiology Studies: No results found.      Scheduled  Meds:  atorvastatin  40 mg Oral Daily   bictegravir-emtricitabine-tenofovir AF  1 tablet Oral Daily   [START ON 05/20/2023] Chlorhexidine Gluconate Cloth  6 each Topical Q0600   gabapentin  300 mg Oral TID   ketorolac  15 mg Intravenous Q6H   mupirocin ointment  1 Application Nasal BID   pantoprazole  40 mg Oral Daily   sulfamethoxazole-trimethoprim  1 tablet Oral Daily   Continuous Infusions:  cefTRIAXone (ROCEPHIN)  IV 2 g (05/18/23 2003)   methocarbamol (ROBAXIN) IV 500 mg (05/18/23 2351)   vancomycin 1,000 mg (05/19/23 0518)     LOS: 4 days     Time spent: over 30 min    Lacretia Nicks, MD [facility]   To contact the attending provider between 7A-7P or the covering provider during after hours 7P-7A, please log into the web site [email or web address] and access using universal [place] password for that web site. If you do not have the password, please call the hospital operator.  05/19/2023, 2:26 PM

## 2023-05-19 NOTE — Evaluation (Signed)
Occupational Therapy Evaluation Patient Details Name: Curtis Hodge MRN: [id number] DOB: 1966/01/02 Today's Date: 05/19/2023   History of Present Illness Patient is a 57 year old male who presented after a bug bite on residual limb that became more painful and red. Patient was admitted with sepsis, cellulitis of RLE, PMH: HIV, bullous emphysema, substance abuse, hyperlipidemia, Hep B, depression, schizophrenia   Clinical Impression   Patient evaluated by Occupational Therapy with no further acute OT needs identified. All education has been completed and the patient has no further questions. Patient does not currently have acute OT needs but would benefit from outpatient OT to work on most appropriate mobility device for community level.  See below for any follow-up Occupational Therapy or equipment needs. OT is signing off. Thank you for this referral.       Recommendations for follow up therapy are one component of a multi-disciplinary discharge planning process, led by the attending physician.  Recommendations may be updated based on patient status, additional functional criteria and insurance authorization.   Assistance Recommended at Discharge PRN  Patient can return home with the following Assist for transportation;Help with stairs or ramp for entrance;Assistance with cooking/housework    Functional Status Assessment  Patient has had a recent decline in their functional status and demonstrates the ability to make significant improvements in function in a reasonable and predictable amount of time.  Equipment Recommendations  Other (comment) (follow up with outpatient therapy to establish most appropirate mobility device)       Precautions / Restrictions Precautions Precautions: Fall Restrictions Weight Bearing Restrictions: No Other Position/Activity Restrictions: R BKA with cellulitis      Mobility Bed Mobility Overal bed mobility: Modified Independent                   Transfers Overall transfer level: Modified independent            ADL either performed or assessed with clinical judgement   ADL Overall ADL's : At baseline       General ADL Comments: Patient was able to transfer in and out of bed, transfer to doorway and back with hopping with RW. patient limited in distance with this technique. patietn expressed concerns about being able to get around at home and in community to be able to engage in MD appointments ect. patient was educated that motorized wheelchairs were not handeled at acute level but in outpatient setting. MD present in room at end of session and updated on patients concerns over mobility and beign able to engage in ADLs and IADLs in next level of care with current limitations. extended period of time in room attempting to explain to patient acute care role in continuum of care, prostesis v.s. electric mobility device. patient verbalized understanding.  MD reported he added an order for patient for power mobility device. patient appears to be at baseline for ADLs at this time.     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   Lower Extremity Assessment Lower Extremity Assessment: Defer to PT evaluation RLE Deficits /  Details: knee flex to at least 90*, knee ext ~5*   Cervical / Trunk Assessment Cervical / Trunk Assessment: Normal   Communication Communication Communication: No difficulties   Cognition Arousal/Alertness: Awake/alert Behavior During Therapy: WFL for  tasks assessed/performed Overall Cognitive Status: Within Functional Limits for tasks assessed                        Home Living Family/patient expects to be discharged to:: Unsure Living Arrangements: Other relatives (cousin) Available Help at Discharge: Available PRN/intermittently   Home Access: Stairs to enter Entrance Stairs-Number of Steps: 5 Entrance Stairs-Rails: Right Home Layout: One level               Home Equipment: Agricultural consultant (2 wheels);Cane - single point;Crutches   Additional Comments: Pt reports having a Service dog for COPD; pt reports he has a prosthesis for R      Prior Functioning/Environment Prior Level of Function : Independent/Modified Independent             Mobility Comments: pt reports he has been amublating with prothesis with use of RW vs cane/crutches ADLs Comments: uses wheelchair to get to bus stop, limited time using prostesis, has service dog and typical dog at home,.        OT Problem List: Decreased activity tolerance      OT Treatment/Interventions:      OT Goals(Current goals can be found in the care plan section) Acute Rehab OT Goals Patient Stated Goal: to be better able to move around at home and in community. OT Goal Formulation: All assessment and education complete, DC therapy  OT Frequency:      Co-evaluation PT/OT/SLP Co-Evaluation/Treatment: Yes Reason for Co-Treatment: To address functional/ADL transfers PT goals addressed during session: Mobility/safety with mobility OT goals addressed during session: ADL's and self-care      AM-PAC OT "6 Clicks" Daily Activity     Outcome Measure Help from another person eating meals?: None Help from another person taking care of personal grooming?: None Help from another person toileting, which includes using toliet, bedpan, or urinal?: None Help from another person bathing (including washing, rinsing, drying)?: A Little Help from another person to put on and  taking off regular upper body clothing?: None Help from another person to put on and taking off regular lower body clothing?: None 6 Click Score: 23   End of Session Equipment Utilized During Treatment: Rolling walker (2 wheels)  Activity Tolerance: Patient tolerated treatment well Patient left: in bed;with call bell/phone within reach  OT Visit Diagnosis: Unsteadiness on feet (R26.81)                Time: 6045-4098 OT Time Calculation (min): 44 min Charges:  OT General Charges $OT Visit: 1 Visit OT Evaluation $OT Eval Low Complexity: 1 Low OT Treatments $Self Care/Home Management : 8-22 mins  Rosalio Loud, MS Acute Rehabilitation Department Office# [phone]   Selinda Flavin 05/19/2023, 1:35 PM

## 2023-05-19 NOTE — Progress Notes (Addendum)
Subjective:  Still has a fair amount of tenderness and pain in his right knee   Antibiotics:  Anti-infectives (From admission, onward)    Start     Dose/Rate Route Frequency Ordered Stop   05/16/23 2000  cefTRIAXone (ROCEPHIN) 2 g in sodium chloride 0.9 % 100 mL IVPB        2 g 200 mL/hr over 30 Minutes Intravenous Every 24 hours 05/16/23 1835     05/16/23 1000  sulfamethoxazole-trimethoprim (BACTRIM DS) 800-160 MG per tablet 1 tablet        1 tablet Oral Daily 05/15/23 2059     05/16/23 0600  metroNIDAZOLE (FLAGYL) IVPB 500 mg  Status:  Discontinued        500 mg 100 mL/hr over 60 Minutes Intravenous Every 12 hours 05/15/23 2026 05/16/23 0746   05/16/23 0600  vancomycin (VANCOCIN) IVPB 1000 mg/200 mL premix        1,000 mg 200 mL/hr over 60 Minutes Intravenous Every 12 hours 05/15/23 2035     05/16/23 0130  ceFEPIme (MAXIPIME) 1 g in sodium chloride 0.9 % 100 mL IVPB  Status:  Discontinued        1 g 200 mL/hr over 30 Minutes Intravenous Every 8 hours 05/15/23 2035 05/15/23 2037   05/16/23 0130  ceFEPIme (MAXIPIME) 2 g in sodium chloride 0.9 % 100 mL IVPB  Status:  Discontinued        2 g 200 mL/hr over 30 Minutes Intravenous Every 8 hours 05/15/23 2037 05/16/23 1532   05/15/23 2145  bictegravir-emtricitabine-tenofovir AF (BIKTARVY) 50-200-25 MG per tablet 1 tablet        1 tablet Oral Daily 05/15/23 2059     05/15/23 1700  vancomycin (VANCOREADY) IVPB 1750 mg/350 mL        1,750 mg 175 mL/hr over 120 Minutes Intravenous  Once 05/15/23 1650 05/15/23 2021   05/15/23 1645  ceFEPIme (MAXIPIME) 2 g in sodium chloride 0.9 % 100 mL IVPB        2 g 200 mL/hr over 30 Minutes Intravenous  Once 05/15/23 1644 05/15/23 1741   05/15/23 1645  metroNIDAZOLE (FLAGYL) IVPB 500 mg        500 mg 100 mL/hr over 60 Minutes Intravenous  Once 05/15/23 1644 05/15/23 1857   05/15/23 1645  vancomycin (VANCOCIN) IVPB 1000 mg/200 mL premix  Status:  Discontinued        1,000 mg 200 mL/hr  over 60 Minutes Intravenous  Once 05/15/23 1644 05/15/23 1650       Medications: Scheduled Meds:  atorvastatin  40 mg Oral Daily   bictegravir-emtricitabine-tenofovir AF  1 tablet Oral Daily   gabapentin  300 mg Oral TID   ketorolac  15 mg Intravenous Q6H   pantoprazole  40 mg Oral Daily   sulfamethoxazole-trimethoprim  1 tablet Oral Daily   Continuous Infusions:  cefTRIAXone (ROCEPHIN)  IV 2 g (05/18/23 2003)   methocarbamol (ROBAXIN) IV 500 mg (05/18/23 2351)   vancomycin 1,000 mg (05/19/23 0518)   PRN Meds:.acetaminophen **OR** acetaminophen, methocarbamol (ROBAXIN) IV, morphine injection, ondansetron **OR** ondansetron (ZOFRAN) IV, oxyCODONE    Objective: Weight change:   Intake/Output Summary (Last 24 hours) at 05/19/2023 1121 Last data filed at 05/19/2023 0914 Gross per 24 hour  Intake 1295 ml  Output 2150 ml  Net -855 ml   Blood pressure 136/86, pulse 78, temperature 99.6 F (37.6 C), temperature source Oral, resp. rate 18, height 5\' 10"  (1.778 m), weight 77.1  kg, SpO2 100 %. Temp:  [98.2 F (36.8 C)-99.6 F (37.6 C)] 99.6 F (37.6 C) (07/01 0359) Pulse Rate:  [73-79] 78 (07/01 0359) Resp:  [18-20] 18 (07/01 0359) BP: (136-158)/(84-95) 136/86 (07/01 0359) SpO2:  [100 %] 100 % (07/01 0359) Weight:  [77.1 kg] 77.1 kg (07/01 1019)  Physical Exam: Physical Exam Constitutional:      Appearance: He is well-developed.  HENT:     Head: Normocephalic and atraumatic.  Eyes:     Conjunctiva/sclera: Conjunctivae normal.  Cardiovascular:     Rate and Rhythm: Normal rate and regular rhythm.  Pulmonary:     Effort: Pulmonary effort is normal. No respiratory distress.     Breath sounds: No wheezing.  Abdominal:     General: There is no distension.     Palpations: Abdomen is soft.  Musculoskeletal:     Cervical back: Normal range of motion and neck supple.  Skin:    General: Skin is warm.  Neurological:     General: No focal deficit present.     Mental Status: He  is alert and oriented to person, place, and time.  Psychiatric:        Mood and Affect: Mood normal.        Behavior: Behavior normal.        Thought Content: Thought content normal.        Judgment: Judgment normal.     Right knee   05/17/2023:         05/19/2023:        CBC:    BMET Recent Labs    05/18/23 0458 05/19/23 0343  NA 133* 135  K 3.5 3.9  CL 101 103  CO2 25 26  GLUCOSE 114* 123*  BUN 10 10  CREATININE 0.96 1.08  CALCIUM 7.8* 8.0*     Liver Panel  Recent Labs    05/18/23 0458 05/19/23 0343  PROT 6.7 6.9  ALBUMIN 2.3* 2.4*  AST 70* 62*  ALT 57* 58*  ALKPHOS 87 102  BILITOT 0.5 0.4       Sedimentation Rate No results for input(s): "ESRSEDRATE" in the last 72 hours. C-Reactive Protein No results for input(s): "CRP" in the last 72 hours.  Micro Results: Recent Results (from the past 720 hour(s))  Blood Culture (routine x 2)     Status: None (Preliminary result)   Collection Time: 05/15/23  4:40 PM   Specimen: BLOOD LEFT ARM  Result Value Ref Range Status   Specimen Description   Final    BLOOD LEFT ARM Performed at [facility], [address], [place], [place] [address]    Special Requests   Final    BOTTLES DRAWN AEROBIC AND ANAEROBIC Blood Culture results may not be optimal due to an inadequate volume of blood received in culture bottles Performed at [facility], [address] [place], [place] [address]    Culture   Final    NO GROWTH 3 DAYS Performed at [facility], [address], [place], [place] [address]    Report Status PENDING  Incomplete  Blood Culture (routine x 2)     Status: None (Preliminary result)   Collection Time: 05/15/23  4:48 PM   Specimen: BLOOD RIGHT ARM  Result Value Ref Range Status   Specimen Description   Final    BLOOD RIGHT ARM Performed at [facility], [address], [place], [place] [address]    Special Requests  Final    BOTTLES DRAWN  AEROBIC AND ANAEROBIC Blood Culture results may not be optimal due to an excessive volume of blood received in culture bottles Performed at [facility], [address], [place], [place] [address]    Culture   Final    NO GROWTH 3 DAYS Performed at [facility], [address], [place], [place] [address]    Report Status PENDING  Incomplete  Resp panel by RT-PCR (RSV, Flu A&B, Covid) Urine, Clean Catch     Status: None   Collection Time: 05/15/23  5:42 PM   Specimen: Urine, Clean Catch; Nasal Swab  Result Value Ref Range Status   SARS Coronavirus 2 by RT PCR NEGATIVE NEGATIVE Final    Comment: (NOTE) SARS-CoV-2 target nucleic acids are NOT DETECTED.  The SARS-CoV-2 RNA is generally detectable in upper respiratory specimens during the acute phase of infection. The lowest concentration of SARS-CoV-2 viral copies this assay can detect is 138 copies/mL. A negative result does not preclude SARS-Cov-2 infection and should not be used as the sole basis for treatment or other patient management decisions. A negative result may occur with  improper specimen collection/handling, submission of specimen other than nasopharyngeal swab, presence of viral mutation(s) within the areas targeted by this assay, and inadequate number of viral copies(<138 copies/mL). A negative result must be combined with clinical observations, patient history, and epidemiological information. The expected result is Negative.  Fact Sheet for Patients:  BloggerCourse.com  Fact Sheet for Healthcare Providers:  SeriousBroker.it  This test is no t yet approved or cleared by the [place] FDA and  has been authorized for detection and/or diagnosis of SARS-CoV-2 by FDA under an Emergency Use Authorization (EUA). This EUA will remain  in effect (meaning this test can be used) for the duration of the COVID-19 declaration under Section 564(b)(1)  of the Act, 21 U.S.C.section 360bbb-3(b)(1), unless the authorization is terminated  or revoked sooner.       Influenza A by PCR NEGATIVE NEGATIVE Final   Influenza B by PCR NEGATIVE NEGATIVE Final    Comment: (NOTE) The Xpert Xpress SARS-CoV-2/FLU/RSV plus assay is intended as an aid in the diagnosis of influenza from Nasopharyngeal swab specimens and should not be used as a sole basis for treatment. Nasal washings and aspirates are unacceptable for Xpert Xpress SARS-CoV-2/FLU/RSV testing.  Fact Sheet for Patients: BloggerCourse.com  Fact Sheet for Healthcare Providers: SeriousBroker.it  This test is not yet approved or cleared by the [place] FDA and has been authorized for detection and/or diagnosis of SARS-CoV-2 by FDA under an Emergency Use Authorization (EUA). This EUA will remain in effect (meaning this test can be used) for the duration of the COVID-19 declaration under Section 564(b)(1) of the Act, 21 U.S.C. section 360bbb-3(b)(1), unless the authorization is terminated or revoked.     Resp Syncytial Virus by PCR NEGATIVE NEGATIVE Final    Comment: (NOTE) Fact Sheet for Patients: BloggerCourse.com  Fact Sheet for Healthcare Providers: SeriousBroker.it  This test is not yet approved or cleared by the [place] FDA and has been authorized for detection and/or diagnosis of SARS-CoV-2 by FDA under an Emergency Use Authorization (EUA). This EUA will remain in effect (meaning this test can be used) for the duration of the COVID-19 declaration under Section 564(b)(1) of the Act, 21 U.S.C. section 360bbb-3(b)(1), unless the authorization is terminated or revoked.  Performed at [facility], [address] [place], [place] [address]   MRSA  Next Gen by PCR, Nasal     Status: Abnormal   Collection Time: 05/17/23 11:39 AM   Specimen: Nasal  Mucosa; Nasal Swab  Result Value Ref Range Status   MRSA by PCR Next Gen DETECTED (A) NOT DETECTED Final    Comment: (NOTE) The GeneXpert MRSA Assay (FDA approved for NASAL specimens only), is one component of a comprehensive MRSA colonization surveillance program. It is not intended to diagnose MRSA infection nor to guide or monitor treatment for MRSA infections. Test performance is not FDA approved in patients less than 20 years old. Performed at [facility], [address], [place], [place] [address]     Studies/Results: No results found.    Assessment/Plan:  INTERVAL HISTORY: he is c/o pain in knee   Principal Problem:   Sepsis due to cellulitis (HCC) Active Problems:   Chronic hepatitis B without delta agent without hepatic coma (HCC)   HIV disease (HCC)   Bullous emphysema (HCC)   Substance abuse (HCC)   Hyperlipidemia   Diarrhea   Thrombocytopenia (HCC)    Curtis Hodge is a 57 y.o. male with  HIV and hepatitis co infection well known to me from having seen him (sporadically) in the clinic and during recent specialization's who had osteomyelitis requiring below the knee amputation.  He has now been admitted with acute swelling of knee and tenderness that he states began with a suspected "insect bite" on top of his knee.  He was admitted to the hospital on 27 June.  Blood cultures were taken which have failed to grow any organisms he was treated initially with vancomycin for PMN metronidazole now narrowed to vancomycin and ceftriaxone.  He had CT on admission which failed to show evidence of septic arthritis.  His erythema and edema have improved though he still quite tenderness still has at minimum subcutaneous fluid and edema in the right knee.  His amputation site looks clean.  Urine is I will order MRI of the right knee with and without contrast.  Will continue vancomycin and ceftriaxone for now but if he does not have the joint  infection we will plan on changing over to oral antibiotics  HIV/hepatitis B, infection: He has a flare of hepatitis B when off his Biktarvy.  Will to ensure that he has a 30-day supply of Biktarvy at discharge.  Housing and Anshul issues: He currently lives in a house --with better social circumstances that what he described to me previously.  He is concerned however that with his active cellulitis and inability to wear a prosthesis anytime soon he may not be able to get around well at home.  He also believes he needs a motorized wheelchair which I think sounds quite reasonable.  Does have a cell phone but only has service when he is connected to Wi-Fi.  I will make sure that he has a time and date of followup appt at discharge.  Depression and hx of substance abuse: former worse in context of loss of his mother who died while in same room number at [facility] and who died on same day he underwent his BKA  I have personally spent 52 minutes involved in face-to-face and non-face-to-face activities for this patient on the day of the visit. Professional time spent includes the following activities: Preparing to see the patient (review of tests), Obtaining and/or reviewing separately obtained history (admission/discharge record), Performing a medically appropriate examination and/or evaluation , Ordering medications/tests/procedures, referring and communicating with other health  care professionals, Documenting clinical information in the EMR, Independently interpreting results (not separately reported), Communicating results to the patient/family/caregiver, Counseling and educating the patient/family/caregiver and Care coordination (not separately reported).     LOS: 4 days   Acey Lav 05/19/2023, 11:21 AM

## 2023-05-19 NOTE — Evaluation (Signed)
Physical Therapy Evaluation-1x Patient Details Name: Curtis Hodge MRN: [id number] DOB: [birth year] Today's Date: 05/19/2023  History of Present Illness  57 yo male admitted with cellulitis, sepsis. 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 He was able to hop around the room without difficulty with use of a RW. Pain in R residual limb did increase with activity. Pt mentioned he would like to have a motorized wheelchair and he is having difficulty getting one. Attempted to explain to patient he would have to follow up with his PCP and SW/CM in outpatient setting. He expressed his frustrations about not being able to have a wheelchair and a prosthesis. No acute PT needs. 1x PT eval. Will sign off.         Assistance Recommended at Discharge PRN  If plan is discharge home, recommend the following:  Can travel by private vehicle           Equipment Recommendations  (patient is requesting a motorized wheelchair-will need outpatient f/u for this)  Recommendations for Other Services       Functional Status Assessment Patient has had a recent decline in their functional status and demonstrates the ability to make significant improvements in function in a reasonable and predictable amount of time.     Precautions / Restrictions Restrictions Weight Bearing Restrictions: No      Mobility  Bed Mobility Overal bed mobility: Modified Independent                  Transfers Overall transfer level: Modified independent                      Ambulation/Gait Ambulation/Gait assistance: Modified independent (Device/Increase time) Gait Distance (Feet): 20 Feet Assistive device: Rolling walker (2 wheels)         General Gait Details: No LOB with RW use. Distance limited by increased pain R residual limb. Pt mobilized around room without  difficulty  Stairs            Wheelchair Mobility     Tilt Bed    Modified Rankin (Stroke Patients Only)       Balance Overall balance assessment: Modified Independent                                           Pertinent Vitals/Pain Pain Assessment Pain Assessment: Faces Faces Pain Scale: Hurts even more Pain Location: R residual limb Pain Descriptors / Indicators: Grimacing Pain Intervention(s): Monitored during session    Home Living Family/patient expects to be discharged to:: Unsure Living Arrangements: Other relatives (cousin) Available Help at Discharge: Available PRN/intermittently   Home Access: Stairs to enter Entrance Stairs-Rails: Right Entrance Stairs-Number of Steps: 5   Home Layout: One level Home Equipment: Agricultural consultant (2 wheels);Cane - single point;Crutches Additional Comments: Pt reports having a Service dog for COPD; pt reports he has a prosthesis for R    Prior Function Prior Level of Function : Independent/Modified Independent             Mobility Comments: pt reports he has been amublating with prothesis with use of RW vs cane/crutches       Hand Dominance   Dominant Hand: Right    Extremity/Trunk Assessment   Upper Extremity Assessment  Upper Extremity Assessment: Defer to OT evaluation    Lower Extremity Assessment Lower Extremity Assessment: RLE deficits/detail RLE Deficits / Details: knee flex to at least 90*, knee ext ~5*    Cervical / Trunk Assessment Cervical / Trunk Assessment: Normal  Communication   Communication: No difficulties  Cognition Arousal/Alertness: Awake/alert Behavior During Therapy: WFL for tasks assessed/performed Overall Cognitive Status: Within Functional Limits for tasks assessed                                          General Comments      Exercises     Assessment/Plan    PT Assessment All further PT needs can be met in the next venue of care  (OP PT)  PT Problem List         PT Treatment Interventions      PT Goals (Current goals can be found in the Care Plan section)  Acute Rehab PT Goals Patient Stated Goal: to get a motorized wheelchair PT Goal Formulation: All assessment and education complete, DC therapy    Frequency       Co-evaluation               AM-PAC PT "6 Clicks" Mobility  Outcome Measure Help needed turning from your back to your side while in a flat bed without using bedrails?: None Help needed moving from lying on your back to sitting on the side of a flat bed without using bedrails?: None Help needed moving to and from a bed to a chair (including a wheelchair)?: None Help needed standing up from a chair using your arms (e.g., wheelchair or bedside chair)?: None Help needed to walk in hospital room?: None Help needed climbing 3-5 steps with a railing? : None 6 Click Score: 24    End of Session   Activity Tolerance: Patient tolerated treatment well;Patient limited by pain Patient left: in bed;with call bell/phone within reach   PT Visit Diagnosis: Pain Pain - Right/Left: Right Pain - part of body: Knee    Time: 1610-9604 PT Time Calculation (min) (ACUTE ONLY): 40 min   Charges:   PT Evaluation $PT Eval Low Complexity: 1 Low PT Treatments $Gait Training: 8-22 mins PT General Charges $$ ACUTE PT VISIT: 1 Visit            Faye Ramsay, PT Acute Rehabilitation  Office: [phone]

## 2023-05-19 NOTE — Plan of Care (Signed)

## 2023-05-20 ENCOUNTER — Other Ambulatory Visit (HOSPITAL_COMMUNITY): Payer: Self-pay

## 2023-05-20 ENCOUNTER — Telehealth (HOSPITAL_COMMUNITY): Payer: Self-pay | Admitting: Pharmacy Technician

## 2023-05-20 DIAGNOSIS — F329 Major depressive disorder, single episode, unspecified: Secondary | ICD-10-CM | POA: Diagnosis not present

## 2023-05-20 DIAGNOSIS — L02419 Cutaneous abscess of limb, unspecified: Secondary | ICD-10-CM | POA: Diagnosis present

## 2023-05-20 DIAGNOSIS — B20 Human immunodeficiency virus [HIV] disease: Secondary | ICD-10-CM | POA: Diagnosis not present

## 2023-05-20 DIAGNOSIS — L0390 Cellulitis, unspecified: Secondary | ICD-10-CM | POA: Diagnosis not present

## 2023-05-20 DIAGNOSIS — A419 Sepsis, unspecified organism: Secondary | ICD-10-CM | POA: Diagnosis not present

## 2023-05-20 LAB — CBC WITH DIFFERENTIAL/PLATELET
Abs Immature Granulocytes: 0.02 10*3/uL (ref 0.00–0.07)
Basophils Absolute: 0 10*3/uL (ref 0.0–0.1)
Basophils Relative: 0 %
Eosinophils Absolute: 0 10*3/uL (ref 0.0–0.5)
Eosinophils Relative: 0 %
HCT: 34.7 % — ABNORMAL LOW (ref 39.0–52.0)
Hemoglobin: 11.5 g/dL — ABNORMAL LOW (ref 13.0–17.0)
Immature Granulocytes: 0 %
Lymphocytes Relative: 23 %
Lymphs Abs: 1.1 10*3/uL (ref 0.7–4.0)
MCH: 31.9 pg (ref 26.0–34.0)
MCHC: 33.1 g/dL (ref 30.0–36.0)
MCV: 96.4 fL (ref 80.0–100.0)
Monocytes Absolute: 0.3 10*3/uL (ref 0.1–1.0)
Monocytes Relative: 7 %
Neutro Abs: 3.3 10*3/uL (ref 1.7–7.7)
Neutrophils Relative %: 70 %
Platelets: 141 10*3/uL — ABNORMAL LOW (ref 150–400)
RBC: 3.6 MIL/uL — ABNORMAL LOW (ref 4.22–5.81)
RDW: 12.9 % (ref 11.5–15.5)
WBC: 4.9 10*3/uL (ref 4.0–10.5)
nRBC: 0 % (ref 0.0–0.2)

## 2023-05-20 LAB — CULTURE, BLOOD (ROUTINE X 2)

## 2023-05-20 LAB — COMPREHENSIVE METABOLIC PANEL
ALT: 65 U/L — ABNORMAL HIGH (ref 0–44)
AST: 70 U/L — ABNORMAL HIGH (ref 15–41)
Albumin: 2.4 g/dL — ABNORMAL LOW (ref 3.5–5.0)
Alkaline Phosphatase: 110 U/L (ref 38–126)
Anion gap: 4 — ABNORMAL LOW (ref 5–15)
BUN: 11 mg/dL (ref 6–20)
CO2: 26 mmol/L (ref 22–32)
Calcium: 7.8 mg/dL — ABNORMAL LOW (ref 8.9–10.3)
Chloride: 100 mmol/L (ref 98–111)
Creatinine, Ser: 0.96 mg/dL (ref 0.61–1.24)
GFR, Estimated: >60 mL/min (ref >60)
Glucose, Bld: 107 mg/dL — ABNORMAL HIGH (ref 70–99)
Potassium: 4.1 mmol/L (ref 3.5–5.1)
Sodium: 130 mmol/L — ABNORMAL LOW (ref 135–145)
Total Bilirubin: 0.2 mg/dL — ABNORMAL LOW (ref 0.3–1.2)
Total Protein: 7.1 g/dL (ref 6.5–8.1)

## 2023-05-20 LAB — PHOSPHORUS: Phosphorus: 2.6 mg/dL (ref 2.5–4.6)

## 2023-05-20 LAB — VANCOMYCIN, TROUGH: Vancomycin Tr: 7 ug/mL — ABNORMAL LOW (ref 15–20)

## 2023-05-20 LAB — MAGNESIUM: Magnesium: 1.9 mg/dL (ref 1.7–2.4)

## 2023-05-20 MED ORDER — OXYCODONE HCL 5 MG PO TABS
10.0000 mg | ORAL_TABLET | ORAL | Status: DC | PRN
  Administered 2023-05-20 – 2023-05-26 (×6): 10 mg via ORAL
  Filled 2023-05-20 (×7): qty 2

## 2023-05-20 MED ORDER — OXYCODONE HCL 5 MG PO TABS: 5.0000 mg | ORAL_TABLET | ORAL | Status: DC | PRN

## 2023-05-20 MED ORDER — VANCOMYCIN HCL 750 MG/150ML IV SOLN
750.0000 mg | Freq: Three times a day (TID) | INTRAVENOUS | Status: DC
  Administered 2023-05-20 – 2023-05-24 (×13): 750 mg via INTRAVENOUS
  Filled 2023-05-20 (×14): qty 150

## 2023-05-20 NOTE — Telephone Encounter (Signed)
Patient Advocate Encounter  Completed and sent [employer] Access Copay Card for [place] for this patient     BIN      [id number] PCN    ACCESS GRP    [id number] ID        [id number]     Roland Earl, CPhT Pharmacy Patient Advocate Specialist [facility] Patient Advocate Team Direct Number: [phone] Fax: [phone]

## 2023-05-20 NOTE — Progress Notes (Signed)
Subjective:  He has had purulence coming from his bite site today.    Antibiotics:  Anti-infectives (From admission, onward)    Start     Dose/Rate Route Frequency Ordered Stop   05/20/23 1400  vancomycin (VANCOREADY) IVPB 750 mg/150 mL        750 mg 150 mL/hr over 60 Minutes Intravenous Every 8 hours 05/20/23 0824     05/16/23 2000  cefTRIAXone (ROCEPHIN) 2 g in sodium chloride 0.9 % 100 mL IVPB        2 g 200 mL/hr over 30 Minutes Intravenous Every 24 hours 05/16/23 1835     05/16/23 1000  sulfamethoxazole-trimethoprim (BACTRIM DS) 800-160 MG per tablet 1 tablet        1 tablet Oral Daily 05/15/23 2059     05/16/23 0600  metroNIDAZOLE (FLAGYL) IVPB 500 mg  Status:  Discontinued        500 mg 100 mL/hr over 60 Minutes Intravenous Every 12 hours 05/15/23 2026 05/16/23 0746   05/16/23 0600  vancomycin (VANCOCIN) IVPB 1000 mg/200 mL premix  Status:  Discontinued        1,000 mg 200 mL/hr over 60 Minutes Intravenous Every 12 hours 05/15/23 2035 05/20/23 0824   05/16/23 0130  ceFEPIme (MAXIPIME) 1 g in sodium chloride 0.9 % 100 mL IVPB  Status:  Discontinued        1 g 200 mL/hr over 30 Minutes Intravenous Every 8 hours 05/15/23 2035 05/15/23 2037   05/16/23 0130  ceFEPIme (MAXIPIME) 2 g in sodium chloride 0.9 % 100 mL IVPB  Status:  Discontinued        2 g 200 mL/hr over 30 Minutes Intravenous Every 8 hours 05/15/23 2037 05/16/23 1532   05/15/23 2145  bictegravir-emtricitabine-tenofovir AF (BIKTARVY) 50-200-25 MG per tablet 1 tablet        1 tablet Oral Daily 05/15/23 2059     05/15/23 1700  vancomycin (VANCOREADY) IVPB 1750 mg/350 mL        1,750 mg 175 mL/hr over 120 Minutes Intravenous  Once 05/15/23 1650 05/15/23 2021   05/15/23 1645  ceFEPIme (MAXIPIME) 2 g in sodium chloride 0.9 % 100 mL IVPB        2 g 200 mL/hr over 30 Minutes Intravenous  Once 05/15/23 1644 05/15/23 1741   05/15/23 1645  metroNIDAZOLE (FLAGYL) IVPB 500 mg        500 mg 100 mL/hr over 60  Minutes Intravenous  Once 05/15/23 1644 05/15/23 1857   05/15/23 1645  vancomycin (VANCOCIN) IVPB 1000 mg/200 mL premix  Status:  Discontinued        1,000 mg 200 mL/hr over 60 Minutes Intravenous  Once 05/15/23 1644 05/15/23 1650       Medications: Scheduled Meds:  atorvastatin  40 mg Oral Daily   bictegravir-emtricitabine-tenofovir AF  1 tablet Oral Daily   Chlorhexidine Gluconate Cloth  6 each Topical Q0600   gabapentin  300 mg Oral TID   ketorolac  15 mg Intravenous Q6H   mupirocin ointment  1 Application Nasal BID   pantoprazole  40 mg Oral Daily   sulfamethoxazole-trimethoprim  1 tablet Oral Daily   Continuous Infusions:  cefTRIAXone (ROCEPHIN)  IV 2 g (05/19/23 2214)   methocarbamol (ROBAXIN) IV 500 mg (05/18/23 2351)   vancomycin 750 mg (05/20/23 1715)   PRN Meds:.acetaminophen **OR** acetaminophen, methocarbamol (ROBAXIN) IV, morphine injection, ondansetron **OR** ondansetron (ZOFRAN) IV, oxyCODONE **OR** oxyCODONE    Objective: Weight change:  Intake/Output Summary (Last 24 hours) at 05/20/2023 1919 Last data filed at 05/20/2023 0700 Gross per 24 hour  Intake 442.68 ml  Output 700 ml  Net -257.32 ml    Blood pressure (!) 144/88, pulse 75, temperature 99.2 F (37.3 C), temperature source Oral, resp. rate 18, height 5\' 10"  (1.778 m), weight 77.1 kg, SpO2 100 %. Temp:  [99.2 F (37.3 C)-99.4 F (37.4 C)] 99.2 F (37.3 C) (07/02 1208) Pulse Rate:  [75-77] 75 (07/02 1208) Resp:  [17-18] 18 (07/02 1208) BP: (144-165)/(88-98) 144/88 (07/02 1208) SpO2:  [98 %-100 %] 100 % (07/02 1208)  Physical Exam: Physical Exam Constitutional:      Appearance: He is well-developed.  HENT:     Head: Normocephalic and atraumatic.  Eyes:     General:        Right eye: No discharge.        Left eye: No discharge.     Conjunctiva/sclera: Conjunctivae normal.  Cardiovascular:     Rate and Rhythm: Normal rate and regular rhythm.  Pulmonary:     Effort: Pulmonary effort is  normal. No respiratory distress.     Breath sounds: No wheezing.  Abdominal:     General: There is no distension.     Palpations: Abdomen is soft.  Musculoskeletal:     Cervical back: Normal range of motion and neck supple.  Skin:    General: Skin is warm.  Neurological:     General: No focal deficit present.     Mental Status: He is alert and oriented to person, place, and time.  Psychiatric:        Mood and Affect: Mood normal.        Behavior: Behavior normal.        Thought Content: Thought content normal.        Judgment: Judgment normal.     Right knee   05/17/2023:         05/19/2023:      05/20/2023:       CBC:    BMET Recent Labs    05/19/23 0343 05/20/23 0437  NA 135 130*  K 3.9 4.1  CL 103 100  CO2 26 26  GLUCOSE 123* 107*  BUN 10 11  CREATININE 1.08 0.96  CALCIUM 8.0* 7.8*      Liver Panel  Recent Labs    05/19/23 0343 05/20/23 0437  PROT 6.9 7.1  ALBUMIN 2.4* 2.4*  AST 62* 70*  ALT 58* 65*  ALKPHOS 102 110  BILITOT 0.4 0.2*        Sedimentation Rate No results for input(s): "ESRSEDRATE" in the last 72 hours. C-Reactive Protein No results for input(s): "CRP" in the last 72 hours.  Micro Results: Recent Results (from the past 720 hour(s))  Blood Culture (routine x 2)     Status: None   Collection Time: 05/15/23  4:40 PM   Specimen: BLOOD LEFT ARM  Result Value Ref Range Status   Specimen Description   Final    BLOOD LEFT ARM Performed at [facility], [address], [place], [place] [address]    Special Requests   Final    BOTTLES DRAWN AEROBIC AND ANAEROBIC Blood Culture results may not be optimal due to an inadequate volume of blood received in culture bottles Performed at [facility], [address], [place], [place] [address]    Culture   Final    NO GROWTH 5 DAYS Performed at [facility],  [address], [place], [place] [address]    Report Status 05/20/2023 FINAL   Final  Blood Culture (routine x 2)     Status: None   Collection Time: 05/15/23  4:48 PM   Specimen: BLOOD RIGHT ARM  Result Value Ref Range Status   Specimen Description   Final    BLOOD RIGHT ARM Performed at [facility], [address], [place], [place] [address]    Special Requests   Final    BOTTLES DRAWN AEROBIC AND ANAEROBIC Blood Culture results may not be optimal due to an excessive volume of blood received in culture bottles Performed at [facility], [address] [place], [place] [address]    Culture   Final    NO GROWTH 5 DAYS Performed at [facility], [address], [place], [place] [address]    Report Status 05/20/2023 FINAL  Final  Resp panel by RT-PCR (RSV, Flu A&B, Covid) Urine, Clean Catch     Status: None   Collection Time: 05/15/23  5:42 PM   Specimen: Urine, Clean Catch; Nasal Swab  Result Value Ref Range Status   SARS Coronavirus 2 by RT PCR NEGATIVE NEGATIVE Final    Comment: (NOTE) SARS-CoV-2 target nucleic acids are NOT DETECTED.  The SARS-CoV-2 RNA is generally detectable in upper respiratory specimens during the acute phase of infection. The lowest concentration of SARS-CoV-2 viral copies this assay can detect is 138 copies/mL. A negative result does not preclude SARS-Cov-2 infection and should not be used as the sole basis for treatment or other patient management decisions. A negative result may occur with  improper specimen collection/handling, submission of specimen other than nasopharyngeal swab, presence of viral mutation(s) within the areas targeted by this assay, and inadequate number of viral copies(<138 copies/mL). A negative result must be combined with clinical observations, patient history, and epidemiological information. The expected result is Negative.  Fact Sheet for Patients:  BloggerCourse.com  Fact Sheet for Healthcare Providers:   SeriousBroker.it  This test is no t yet approved or cleared by the [place] FDA and  has been authorized for detection and/or diagnosis of SARS-CoV-2 by FDA under an Emergency Use Authorization (EUA). This EUA will remain  in effect (meaning this test can be used) for the duration of the COVID-19 declaration under Section 564(b)(1) of the Act, 21 U.S.C.section 360bbb-3(b)(1), unless the authorization is terminated  or revoked sooner.       Influenza A by PCR NEGATIVE NEGATIVE Final   Influenza B by PCR NEGATIVE NEGATIVE Final    Comment: (NOTE) The Xpert Xpress SARS-CoV-2/FLU/RSV plus assay is intended as an aid in the diagnosis of influenza from Nasopharyngeal swab specimens and should not be used as a sole basis for treatment. Nasal washings and aspirates are unacceptable for Xpert Xpress SARS-CoV-2/FLU/RSV testing.  Fact Sheet for Patients: BloggerCourse.com  Fact Sheet for Healthcare Providers: SeriousBroker.it  This test is not yet approved or cleared by the [place] FDA and has been authorized for detection and/or diagnosis of SARS-CoV-2 by FDA under an Emergency Use Authorization (EUA). This EUA will remain in effect (meaning this test can be used) for the duration of the COVID-19 declaration under Section 564(b)(1) of the Act, 21 U.S.C. section 360bbb-3(b)(1), unless the authorization is terminated or revoked.     Resp Syncytial Virus by PCR NEGATIVE NEGATIVE Final    Comment: (NOTE) Fact Sheet for Patients: BloggerCourse.com  Fact Sheet for Healthcare Providers: SeriousBroker.it  This test  is not yet approved or cleared by the [place] and has been authorized for detection and/or diagnosis of SARS-CoV-2 by FDA under an Emergency Use Authorization (EUA). This EUA will remain in effect (meaning this test can be used) for  the duration of the COVID-19 declaration under Section 564(b)(1) of the Act, 21 U.S.C. section 360bbb-3(b)(1), unless the authorization is terminated or revoked.  Performed at [facility], [address] [place], [place] [address]   MRSA Next Gen by PCR, Nasal     Status: Abnormal   Collection Time: 05/17/23 11:39 AM   Specimen: Nasal Mucosa; Nasal Swab  Result Value Ref Range Status   MRSA by PCR Next Gen DETECTED (A) NOT DETECTED Final    Comment: (NOTE) The GeneXpert MRSA Assay (FDA approved for NASAL specimens only), is one component of a comprehensive MRSA colonization surveillance program. It is not intended to diagnose MRSA infection nor to guide or monitor treatment for MRSA infections. Test performance is not FDA approved in patients less than 66 years old. Performed at [facility], [address] [place], [place] [address]     Studies/Results: MR KNEE RIGHT W WO CONTRAST  Result Date: 05/19/2023 CLINICAL DATA:  Cellulitis at right BKA stump. Insect bite with swelling and wound superolateral to the patella. Septic arthritis suspected. EXAM: MRI OF THE RIGHT KNEE WITHOUT AND WITH CONTRAST TECHNIQUE: Multiplanar, multisequence MR imaging of the right knee was performed both before and after administration of intravenous contrast. CONTRAST:  7.24mL GADAVIST GADOBUTROL 1 MMOL/ML IV SOLN COMPARISON:  Radiographs and CT 05/15/2023 FINDINGS: Despite efforts by the technologist and patient, mild motion artifact is present on today's exam and could not be eliminated. This reduces exam sensitivity and specificity. Of note, this examination was performed as an examination of the knee, and does not include the distal and of the tibial stump. MENISCI Medial meniscus:  Intact with normal morphology. Lateral meniscus:  Intact with normal morphology. LIGAMENTS Cruciates: The anterior and posterior cruciate ligaments are intact. Collaterals: The medial and  lateral collateral ligament complexes are intact. CARTILAGE Patellofemoral:  Preserved. Medial:  Mild chondral thinning without focal defect. Lateral:  Mild chondral thinning without focal defect. MISCELLANEOUS Joint: Small knee joint effusion demonstrates mild nonspecific smooth synovial enhancement. This effusion has mildly enlarged from the recent prior CT. Popliteal Fossa: The popliteus muscle and tendon are intact. No significant Baker's cyst. Extensor Mechanism: The visualized quadriceps and patellar tendons are intact. Bones: The bones appear mildly demineralized. No evidence of acute fracture, dislocation or osteonecrosis. Probable mild reactive edema within the patella. There are no signs of osteomyelitis within the visualized bones around the knee. As above, the distal ends of the tibial and fibular stumps are not visualized status post below the knee amputation. Other: There is a large, complex subcutaneous fluid collection with peripheral enhancement anterolaterally, lying superficial to the extensor mechanism and lateral patellar retinaculum. This measures up to 8.4 cm in length and 6.7 x 1.8 cm transverse, suspicious for a superficial abscess. Diffuse surrounding subcutaneous edema and enhancement have also mildly progressed, consistent with worsening cellulitis. No other focal fluid collections are identified. IMPRESSION: 1. Large, complex subcutaneous fluid collection anterolaterally, suspicious for a superficial abscess. Aspiration or drainage of this collection recommended. Surrounding cellulitis has also progressed. 2. Small knee joint effusion with mild nonspecific synovial enhancement, potentially reactive. No findings highly suspicious for septic arthritis or osteomyelitis. Of note, placing an intra-articular needle through the inflamed anterior soft tissues  may introduce infection into the joint. 3. The menisci, cruciate and collateral ligaments are intact. 4. The distal ends of the tibial and  fibular stumps are not visualized status post below the knee amputation. Electronically Signed   By: Carey Bullocks M.D.   On: 05/19/2023 15:11      Assessment/Plan:  INTERVAL HISTORY: MRI complete   Principal Problem:   Sepsis due to cellulitis [facility]) Active Problems:   Chronic hepatitis B without delta agent without hepatic coma (HCC)   HIV disease (HCC)   Bullous emphysema (HCC)   Substance abuse (HCC)   Hyperlipidemia   Diarrhea   Thrombocytopenia (HCC)    Curtis Hodge is a 57 y.o. male with  HIV and hepatitis co infection well known to me from having seen him (sporadically) in the clinic and during recent specialization's who had osteomyelitis requiring below the knee amputation.  He has now been admitted with acute swelling of knee and tenderness that he states began with a suspected "insect bite" on top of his knee.  He was admitted to the hospital on 27 June.  Blood cultures were taken which have failed to grow any organisms he was treated initially with vancomycin for PMN metronidazole now narrowed to vancomycin and ceftriaxone.  He had CT on admission which failed to show evidence of septic arthritis.  MRI showed  IMPRESSION: 1. Large, complex subcutaneous fluid collection anterolaterally, suspicious for a superficial abscess. Aspiration or drainage of this collection recommended. Surrounding cellulitis has also progressed. 2. Small knee joint effusion with mild nonspecific synovial enhancement, potentially reactive. No findings highly suspicious for septic arthritis or osteomyelitis. Of note, placing an intra-articular needle through the inflamed anterior soft tissues may introduce infection into the joint. 3. The menisci, cruciate and collateral ligaments are intact. 4. The distal ends of the tibial and fibular stumps are not visualized status post below the knee amputation.   He has had purulence begin to drain from bite site  Orthopedics evaluation  is pending  I would STRONGLY recommend I and D of abscesses in the OR, defer to orthopedics if there is a window to safely aspirate the joint for cell count and differential  Given he has alrady needed a BKA I think we need to err on the side of aggressive rx  Continue vancomycin and ceftriaxone    HIV/AIDS  Continue Biktarvy and Bactrim   Will to ensure that he has a 30-day supply of Biktarvy at discharge.  Housing issues: He currently lives in a house --with better social circumstances that what he described to me previously.  He is concerned however that with his active cellulitis and inability to wear a prosthesis anytime soon he may not be able to get around well at home.  He also believes he needs a motorized wheelchair which I think sounds quite reasonable.  Does have a cell phone but only has service when he is connected to Wi-Fi.  I will make sure that he has a time and date of followup appt at discharge.  Depression and hx of substance abuse: former worse in context of loss of his mother who died while in same room number at [facility] and who died on same day he underwent his BKA   I have personally spent 52 minutes involved in face-to-face and non-face-to-face activities for this patient on the day of the visit. Professional time spent includes the following activities: Preparing to see the patient (review of tests), Obtaining and/or reviewing separately obtained  history (admission/discharge record), Performing a medically appropriate examination and/or evaluation , Ordering medications/tests/procedures, referring and communicating with other health care professionals, Documenting clinical information in the EMR, Independently interpreting results (not separately reported), Communicating results to the patient/family/caregiver, Counseling and educating the patient/family/caregiver and Care coordination (not separately reported).     LOS: 5 days   Acey Lav 05/20/2023, 7:19 PM

## 2023-05-20 NOTE — Progress Notes (Addendum)
Orthopedic Surgery Progress Note  Patient has been admitted to the hospital for sepsis.  He has been on a medicine service.  Dr. Steward Drone saw him a couple of days ago and there was no concern for septic arthritis at that time.  An MRI was obtained to evaluate further and showed a anterior fluid collection in the area of the patella.  Patient continues to have pain over the anterior knee.  On exam, he had no significant pain through range of motion at the right knee.  There was a palpable fluctuance anterior to the knee.  Lateral to the patella there was a break in the skin and there was active purulent appearing material draining from the wound. TTP over the anterior knee. No other tenderness to palpation.   Plan -Will discuss with Dr. Lajoyce Corners but likely needs debridement. NPO at midnight.   London Sheer, MD Orthopedic Surgeon
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
## 2023-05-20 NOTE — TOC Benefit Eligibility Note (Signed)
Pharmacy Patient Advocate Encounter  Insurance verification completed.    The patient is insured through [employer] claim for [place] and the current 30 day co-pay is [phone] due to a deductible.   This test claim was processed through [facility]- copay amounts may vary at other pharmacies due to pharmacy/plan contracts, or as the patient moves through the different stages of their insurance plan.    Roland Earl, CPHT Pharmacy Patient Advocate Specialist [facility] Patient Advocate Team Direct Number: [phone]  Fax: [phone]

## 2023-05-20 NOTE — Progress Notes (Signed)
Pharmacy Antibiotic Note  Curtis Hodge is a 57 y.o. male admitted on 05/15/2023 with  wound infection .  Pharmacy has been consulted for Vanco dosing.  ID: Sepsis with R BKA  cellulitis; diarrhea Uncontrolled HIV  - CT R knee: cellulitis, no abscess, no osteo - Ortho not concerned for septic arthritis 6/29  - Tm 102.2 on admit (103.3 by EMS) > 99.4. WBC WNL, Sed rate 39, CRP 10.7 Sr stable ~ 1 on vanco  Vanco 6/27>> 6/28 CTX>>  Cefepime 6/27>>6/28 Flagyl 6/27>>6/28 Biktarvy as PTA> Bactrim 1 DS qday as PTA>>  6/27 BCx2 ngtd 6/29 HIV RNA: 21,300 down from 3 months ago 6/29 cryptococcal antigen: negative 6/29 MRSA nasal swab: POS +  Vanco levels: 7/1: 2130 peak 25 7/1: dose given 1809 7/2: 0437 trough 7 AUC 410  Plan: 7/2: Incr Vanco to 750mg  IV q8hr for AUC 460     Height: 5\' 10"  (177.8 cm) Weight: 77.1 kg (170 lb) IBW/kg (Calculated) : 73  Temp (24hrs), Avg:99.1 F (37.3 C), Min:98.8 F (37.1 C), Max:99.4 F (37.4 C)  Recent Labs  Lab 05/15/23 1648 05/16/23 0416 05/18/23 0458 05/19/23 0343 05/19/23 2130 05/20/23 0437  WBC 7.9 7.1 4.0 5.0  --  4.9  CREATININE 0.97 1.01 0.96 1.08  --  0.96  LATICACIDVEN 1.7  --   --   --   --   --   VANCOTROUGH  --   --   --   --   --  7*  VANCOPEAK  --   --   --   --  25*  --     Estimated Creatinine Clearance: 88.7 mL/min (by C-G formula based on SCr of 0.96 mg/dL).    No Known Allergies  Curtis Hodge S. Curtis Hodge, PharmD, BCPS Clinical Staff Pharmacist [email or web address] Pasty Spillers 05/20/2023 8:25 AM

## 2023-05-20 NOTE — Progress Notes (Signed)
PROGRESS NOTE    Curtis Hodge  ZOX:[id number] DOB: 1966/06/14 DOA: 05/15/2023 PCP: Hoy Register, MD  Chief Complaint  Patient presents with   Insect Bite    Brief Narrative:   Curtis Hodge is Curtis Hodge 57 y.o. male with medical history significant of COPD, hepatitis, HIV, schizophrenia,  substance use disorder, hx of OM s/p right BKA in 11/2022 who presented with concerns for infection in his BKA stump after he had an insect bite on Sunday.  On broad spectrum abx, MRI 7/1 with complex subcutaneous fluid collection.  Awaiting orthopedics eval.  See below for additional details   Assessment & Plan:   Principal Problem:   Sepsis due to cellulitis (HCC) Active Problems:   Diarrhea   Thrombocytopenia (HCC)   Chronic hepatitis B without delta agent without hepatic coma (HCC)   HIV disease (HCC)   Bullous emphysema (HCC)   Substance abuse (HCC)   Hyperlipidemia  Sepsis due to cellulitis of right lower extremity 57 year old presenting with five day history of worsening swelling, pain and redness to knee and stump of his right BKA after getting bit by insect meeting sepsis criteria with fever to 102, tachycardia and leukocytosis  - saw ortho 6/25, got rx for keflex - failed keflex - CT R knee with diffuse subcutaneous edema c/w cellulitis (no fluid collection or abscess) - no evidence osteomyelitis - MRI 7/1 with large complex subcutaneous fluid collection anterolaterally concerning for superficial abscess, small knee joint effusion - orthopedic to come to see him today with regards to subcutaneous fluid collection - CRP 10.7, sed rate 39 - uric acid low - follow blood cultures - Ngx3 days - continue broad spectrum abx with vanc given reported purulent drainage - scheduled toradol, prn oxy/morphine - bowel regimen - appreciate ID assistance in setting of HIV infection  Tremor - due to pain, follow  - seems improved   Diarrhea Resolved, will hold GI path and c diff for  now   Thrombocytopenia (HCC) Likely due to sepsis vs. Liver disease Typically wnl at baseline improving   Chronic hepatitis B without delta agent without hepatic coma (HCC) Liver enzymes stable at baseline.   Platelet low likely in setting of sepsis/infection  INR wnl    HIV disease (HCC) Followed by ID HIV quant 21,300 CD4 count 177 Decreased CD4 (195 in March 2024) Continue Biktarvy and bactrim    Bullous emphysema (HCC) Stable. No compliant with inhalers    Substance abuse (HCC) Check UDS (cocaine, THC) Declines nicotine patch    Hyperlipidemia Continue crestor, LFTs stable      DVT prophylaxis: SCD Code Status: full Family Communication: none Disposition:   Status is: Inpatient Remains inpatient appropriate because: need for IV abx   Consultants:  ID  Procedures:  none  Antimicrobials:  Anti-infectives (From admission, onward)    Start     Dose/Rate Route Frequency Ordered Stop   05/20/23 1400  vancomycin (VANCOREADY) IVPB 750 mg/150 mL        750 mg 150 mL/hr over 60 Minutes Intravenous Every 8 hours 05/20/23 0824     05/16/23 2000  cefTRIAXone (ROCEPHIN) 2 g in sodium chloride 0.9 % 100 mL IVPB        2 g 200 mL/hr over 30 Minutes Intravenous Every 24 hours 05/16/23 1835     06 /28/24 1000  sulfamethoxazole-trimethoprim (BACTRIM DS) 800-160 MG per tablet 1 tablet        1 tablet Oral Daily 05/15/23 2059  05/16/23 0600  metroNIDAZOLE (FLAGYL) IVPB 500 mg  Status:  Discontinued        500 mg 100 mL/hr over 60 Minutes Intravenous Every 12 hours 05/15/23 2026 05/16/23 0746   05/16/23 0600  vancomycin (VANCOCIN) IVPB 1000 mg/200 mL premix  Status:  Discontinued        1,000 mg 200 mL/hr over 60 Minutes Intravenous Every 12 hours 05/15/23 2035 05/20/23 0824   05/16/23 0130  ceFEPIme (MAXIPIME) 1 g in sodium chloride 0.9 % 100 mL IVPB  Status:  Discontinued        1 g 200 mL/hr over 30 Minutes Intravenous Every 8 hours 05/15/23 2035 05/15/23 2037    05/16/23 0130  ceFEPIme (MAXIPIME) 2 g in sodium chloride 0.9 % 100 mL IVPB  Status:  Discontinued        2 g 200 mL/hr over 30 Minutes Intravenous Every 8 hours 05/15/23 2037 05/16/23 1532   05/15/23 2145  bictegravir-emtricitabine-tenofovir AF (BIKTARVY) 50-200-25 MG per tablet 1 tablet        1 tablet Oral Daily 05/15/23 2059     05/15/23 1700  vancomycin (VANCOREADY) IVPB 1750 mg/350 mL        1,750 mg 175 mL/hr over 120 Minutes Intravenous  Once 05/15/23 1650 05/15/23 2021   05/15/23 1645  ceFEPIme (MAXIPIME) 2 g in sodium chloride 0.9 % 100 mL IVPB        2 g 200 mL/hr over 30 Minutes Intravenous  Once 05/15/23 1644 05/15/23 1741   05/15/23 1645  metroNIDAZOLE (FLAGYL) IVPB 500 mg        500 mg 100 mL/hr over 60 Minutes Intravenous  Once 05/15/23 1644 05/15/23 1857   05/15/23 1645  vancomycin (VANCOCIN) IVPB 1000 mg/200 mL premix  Status:  Discontinued        1,000 mg 200 mL/hr over 60 Minutes Intravenous  Once 05/15/23 1644 05/15/23 1650       Subjective: Continuing to ask about motorized wheelchair Frustrated about Curtis Hodge lot of his care  Objective: Vitals:   05/19/23 1309 05/19/23 1957 05/20/23 0415 05/20/23 1208  BP: (!) 152/93 (!) 165/98 (!) 147/95 (!) 144/88  Pulse: (!) 40 77 77 75  Resp: 16 18 17 18   Temp: 98.8 F (37.1 C) 99.4 F (37.4 C) 99.2 F (37.3 C) 99.2 F (37.3 C)  TempSrc: Oral Oral Oral Oral  SpO2: 100% 100% 98% 100%  Weight:      Height:        Intake/Output Summary (Last 24 hours) at 05/20/2023 1531 Last data filed at 05/20/2023 0700 Gross per 24 hour  Intake 442.68 ml  Output 900 ml  Net -457.32 ml   Filed Weights   05/15/23 1552 05/15/23 1702  Weight: 77.1 kg 77.1 kg    Examination:  General: No acute distress. Lungs: unlabored Neurological: Alert and oriented 3. Moves all extremities 4 with equal strength. Cranial nerves II through XII grossly intact. Extremities: R knee with drainage from the bug bite, purulent   Data Reviewed: I  have personally reviewed following labs and imaging studies  CBC: Recent Labs  Lab 05/15/23 1648 05/16/23 0416 05/18/23 0458 05/19/23 0343 05/20/23 0437  WBC 7.9 7.1 4.0 5.0 4.9  NEUTROABS 6.4  --  2.6 3.3 3.3  HGB 12.5* 11.5* 11.3* 11.7* 11.5*  HCT 37.7* 34.4* 33.6* 35.4* 34.7*  MCV 97.2 97.7 95.7 98.6 96.4  PLT 101* 86* 118* 127* 141*    Basic Metabolic Panel: Recent Labs  Lab 05/15/23 1648 05/16/23  7829 05/18/23 0458 05/19/23 0343 05/20/23 0437  NA 132* 132* 133* 135 130*  K 3.5 4.0 3.5 3.9 4.1  CL 101 101 101 103 100  CO2 25 27 25 26 26   GLUCOSE 97 167* 114* 123* 107*  BUN 10 11 10 10 11   CREATININE 0.97 1.01 0.96 1.08 0.96  CALCIUM 8.2* 8.2* 7.8* 8.0* 7.8*  MG  --  1.9 1.8 1.9 1.9  PHOS  --   --  3.3 3.1 2.6    GFR: Estimated Creatinine Clearance: 88.7 mL/min (by C-G formula based on SCr of 0.96 mg/dL).  Liver Function Tests: Recent Labs  Lab 05/15/23 1648 05/16/23 0416 05/18/23 0458 05/19/23 0343 05/20/23 0437  AST 71* 58* 70* 62* 70*  ALT 68* 53* 57* 58* 65*  ALKPHOS 88 74 87 102 110  BILITOT 1.5* 1.1 0.5 0.4 0.2*  PROT 7.9 6.7 6.7 6.9 7.1  ALBUMIN 3.0* 2.5* 2.3* 2.4* 2.4*    CBG: No results for input(s): "GLUCAP" in the last 168 hours.   Recent Results (from the past 240 hour(s))  Blood Culture (routine x 2)     Status: None   Collection Time: 05/15/23  4:40 PM   Specimen: BLOOD LEFT ARM  Result Value Ref Range Status   Specimen Description   Final    BLOOD LEFT ARM Performed at [facility], [address] [place], [place] [address]    Special Requests   Final    BOTTLES DRAWN AEROBIC AND ANAEROBIC Blood Culture results may not be optimal due to an inadequate volume of blood received in culture bottles Performed at [facility], [address] [place], [place] [address]    Culture   Final    NO GROWTH 5 DAYS Performed at [facility], [address], [place], [place] [address]    Report Status  05/20/2023 FINAL  Final  Blood Culture (routine x 2)     Status: None   Collection Time: 05/15/23  4:48 PM   Specimen: BLOOD RIGHT ARM  Result Value Ref Range Status   Specimen Description   Final    BLOOD RIGHT ARM Performed at [facility], [address], [place], [place] [address]    Special Requests   Final    BOTTLES DRAWN AEROBIC AND ANAEROBIC Blood Culture results may not be optimal due to an excessive volume of blood received in culture bottles Performed at [facility], [address], [place], [place] [address]    Culture   Final    NO GROWTH 5 DAYS Performed at [facility], [address], [place], [place] [address]    Report Status 05/20/2023 FINAL  Final  Resp panel by RT-PCR (RSV, Flu Elecia Serafin&B, Covid) Urine, Clean Catch     Status: None   Collection Time: 05/15/23  5:42 PM   Specimen: Urine, Clean Catch; Nasal Swab  Result Value Ref Range Status   SARS Coronavirus 2 by RT PCR NEGATIVE NEGATIVE Final    Comment: (NOTE) SARS-CoV-2 target nucleic acids are NOT DETECTED.  The SARS-CoV-2 RNA is generally detectable in upper respiratory specimens during the acute phase of infection. The lowest concentration of SARS-CoV-2 viral copies this assay can detect is 138 copies/mL. Neya Creegan negative result does not preclude SARS-Cov-2 infection and should not be used as the sole basis for treatment or other patient management decisions. Gillie Crisci negative result may occur with  improper specimen collection/handling, submission of specimen other than  nasopharyngeal swab, presence of viral mutation(s) within the areas targeted by this assay, and inadequate number of viral copies(<138 copies/mL). Makhiya Coburn negative result must be combined with clinical observations, patient history, and epidemiological information. The expected result is Negative.  Fact Sheet for Patients:  BloggerCourse.com  Fact Sheet for Healthcare Providers:   SeriousBroker.it  This test is no t yet approved or cleared by the [place] FDA and  has been authorized for detection and/or diagnosis of SARS-CoV-2 by FDA under an Emergency Use Authorization (EUA). This EUA will remain  in effect (meaning this test can be used) for the duration of the COVID-19 declaration under Section 564(b)(1) of the Act, 21 U.S.C.section 360bbb-3(b)(1), unless the authorization is terminated  or revoked sooner.       Influenza Carmelia Tiner by PCR NEGATIVE NEGATIVE Final   Influenza B by PCR NEGATIVE NEGATIVE Final    Comment: (NOTE) The Xpert Xpress SARS-CoV-2/FLU/RSV plus assay is intended as an aid in the diagnosis of influenza from Nasopharyngeal swab specimens and should not be used as Renita Brocks sole basis for treatment. Nasal washings and aspirates are unacceptable for Xpert Xpress SARS-CoV-2/FLU/RSV testing.  Fact Sheet for Patients: BloggerCourse.com  Fact Sheet for Healthcare Providers: SeriousBroker.it  This test is not yet approved or cleared by the [place] FDA and has been authorized for detection and/or diagnosis of SARS-CoV-2 by FDA under an Emergency Use Authorization (EUA). This EUA will remain in effect (meaning this test can be used) for the duration of the COVID-19 declaration under Section 564(b)(1) of the Act, 21 U.S.C. section 360bbb-3(b)(1), unless the authorization is terminated or revoked.     Resp Syncytial Virus by PCR NEGATIVE NEGATIVE Final    Comment: (NOTE) Fact Sheet for Patients: BloggerCourse.com  Fact Sheet for Healthcare Providers: SeriousBroker.it  This test is not yet approved or cleared by the [place] FDA and has been authorized for detection and/or diagnosis of SARS-CoV-2 by FDA under an Emergency Use Authorization (EUA). This EUA will remain in effect (meaning this test can be used) for  the duration of the COVID-19 declaration under Section 564(b)(1) of the Act, 21 U.S.C. section 360bbb-3(b)(1), unless the authorization is terminated or revoked.  Performed at [facility], [address], [place], [place] [address]   MRSA Next Gen by PCR, Nasal     Status: Abnormal   Collection Time: 05/17/23 11:39 AM   Specimen: Nasal Mucosa; Nasal Swab  Result Value Ref Range Status   MRSA by PCR Next Gen DETECTED (Chrystle Murillo) NOT DETECTED Final    Comment: (NOTE) The GeneXpert MRSA Assay (FDA approved for NASAL specimens only), is one component of Leeya Rusconi comprehensive MRSA colonization surveillance program. It is not intended to diagnose MRSA infection nor to guide or monitor treatment for MRSA infections. Test performance is not FDA approved in patients less than 49 years old. Performed at [facility], [address] [place], [place] [address]          Radiology Studies: MR KNEE RIGHT W WO CONTRAST  Result Date: 05/19/2023 CLINICAL DATA:  Cellulitis at right BKA stump. Insect bite with swelling and wound superolateral to the patella. Septic arthritis suspected. EXAM: MRI OF THE RIGHT KNEE WITHOUT AND WITH CONTRAST TECHNIQUE: Multiplanar, multisequence MR imaging of the right knee was performed both before and after administration of intravenous contrast. CONTRAST:  7.81mL GADAVIST GADOBUTROL 1 MMOL/ML IV SOLN COMPARISON:  Radiographs and CT 05/15/2023 FINDINGS: Despite efforts by the technologist and patient, mild motion artifact  is present on today's exam and could not be eliminated. This reduces exam sensitivity and specificity. Of note, this examination was performed as an examination of the knee, and does not include the distal and of the tibial stump. MENISCI Medial meniscus:  Intact with normal morphology. Lateral meniscus:  Intact with normal morphology. LIGAMENTS Cruciates: The anterior and posterior cruciate ligaments are intact. Collaterals: The  medial and lateral collateral ligament complexes are intact. CARTILAGE Patellofemoral:  Preserved. Medial:  Mild chondral thinning without focal defect. Lateral:  Mild chondral thinning without focal defect. MISCELLANEOUS Joint: Small knee joint effusion demonstrates mild nonspecific smooth synovial enhancement. This effusion has mildly enlarged from the recent prior CT. Popliteal Fossa: The popliteus muscle and tendon are intact. No significant Baker's cyst. Extensor Mechanism: The visualized quadriceps and patellar tendons are intact. Bones: The bones appear mildly demineralized. No evidence of acute fracture, dislocation or osteonecrosis. Probable mild reactive edema within the patella. There are no signs of osteomyelitis within the visualized bones around the knee. As above, the distal ends of the tibial and fibular stumps are not visualized status post below the knee amputation. Other: There is Nyeemah Jennette large, complex subcutaneous fluid collection with peripheral enhancement anterolaterally, lying superficial to the extensor mechanism and lateral patellar retinaculum. This measures up to 8.4 cm in length and 6.7 x 1.8 cm transverse, suspicious for Olivette Beckmann superficial abscess. Diffuse surrounding subcutaneous edema and enhancement have also mildly progressed, consistent with worsening cellulitis. No other focal fluid collections are identified. IMPRESSION: 1. Large, complex subcutaneous fluid collection anterolaterally, suspicious for Montez Cuda superficial abscess. Aspiration or drainage of this collection recommended. Surrounding cellulitis has also progressed. 2. Small knee joint effusion with mild nonspecific synovial enhancement, potentially reactive. No findings highly suspicious for septic arthritis or osteomyelitis. Of note, placing an intra-articular needle through the inflamed anterior soft tissues may introduce infection into the joint. 3. The menisci, cruciate and collateral ligaments are intact. 4. The distal ends of the  tibial and fibular stumps are not visualized status post below the knee amputation. Electronically Signed   By: Carey Bullocks M.D.   On: 05/19/2023 15:11        Scheduled Meds:  atorvastatin  40 mg Oral Daily   bictegravir-emtricitabine-tenofovir AF  1 tablet Oral Daily   Chlorhexidine Gluconate Cloth  6 each Topical Q0600   gabapentin  300 mg Oral TID   ketorolac  15 mg Intravenous Q6H   mupirocin ointment  1 Application Nasal BID   pantoprazole  40 mg Oral Daily   sulfamethoxazole-trimethoprim  1 tablet Oral Daily   Continuous Infusions:  cefTRIAXone (ROCEPHIN)  IV 2 g (05/19/23 2214)   methocarbamol (ROBAXIN) IV 500 mg (05/18/23 2351)   vancomycin       LOS: 5 days    Time spent: over 30 min    Lacretia Nicks, MD [facility]   To contact the attending provider between 7A-7P or the covering provider during after hours 7P-7A, please log into the web site [email or web address] and access using universal [place] password for that web site. If you do not have the password, please call the hospital operator.  05/20/2023, 3:31 PM

## 2023-05-21 ENCOUNTER — Inpatient Hospital Stay (HOSPITAL_COMMUNITY): Payer: 59 | Admitting: Anesthesiology

## 2023-05-21 ENCOUNTER — Encounter (HOSPITAL_COMMUNITY)
Admission: EM | Disposition: A | Discharge status: Home / Self Care | Payer: Self-pay | Source: Home / Self Care | Attending: Family Medicine

## 2023-05-21 ENCOUNTER — Other Ambulatory Visit (HOSPITAL_COMMUNITY): Payer: Self-pay

## 2023-05-21 DIAGNOSIS — L02419 Cutaneous abscess of limb, unspecified: Secondary | ICD-10-CM | POA: Diagnosis not present

## 2023-05-21 DIAGNOSIS — M71161 Other infective bursitis, right knee: Secondary | ICD-10-CM

## 2023-05-21 DIAGNOSIS — F329 Major depressive disorder, single episode, unspecified: Secondary | ICD-10-CM | POA: Diagnosis not present

## 2023-05-21 DIAGNOSIS — J439 Emphysema, unspecified: Secondary | ICD-10-CM | POA: Diagnosis not present

## 2023-05-21 DIAGNOSIS — M7041 Prepatellar bursitis, right knee: Secondary | ICD-10-CM

## 2023-05-21 DIAGNOSIS — F17210 Nicotine dependence, cigarettes, uncomplicated: Secondary | ICD-10-CM

## 2023-05-21 DIAGNOSIS — I10 Essential (primary) hypertension: Secondary | ICD-10-CM | POA: Diagnosis not present

## 2023-05-21 DIAGNOSIS — L0390 Cellulitis, unspecified: Secondary | ICD-10-CM | POA: Diagnosis not present

## 2023-05-21 DIAGNOSIS — B20 Human immunodeficiency virus [HIV] disease: Secondary | ICD-10-CM | POA: Diagnosis not present

## 2023-05-21 DIAGNOSIS — A419 Sepsis, unspecified organism: Secondary | ICD-10-CM | POA: Diagnosis not present

## 2023-05-21 HISTORY — PX: IRRIGATION AND DEBRIDEMENT KNEE: SHX5185

## 2023-05-21 SURGERY — IRRIGATION AND DEBRIDEMENT KNEE
Anesthesia: General | Site: Knee | Laterality: Right

## 2023-05-21 MED ORDER — ACETAMINOPHEN 500 MG PO TABS
1000.0000 mg | ORAL_TABLET | Freq: Once | ORAL | Status: AC
  Administered 2023-05-21: 1000 mg via ORAL

## 2023-05-21 MED ORDER — AMISULPRIDE (ANTIEMETIC) 5 MG/2ML IV SOLN: 10.0000 mg | Freq: Once | INTRAVENOUS | Status: DC | PRN

## 2023-05-21 MED ORDER — CHLORHEXIDINE GLUCONATE 0.12 % MT SOLN
15.0000 mL | Freq: Once | OROMUCOSAL | Status: AC
  Administered 2023-05-21: 15 mL via OROMUCOSAL

## 2023-05-21 MED ORDER — FENTANYL CITRATE (PF) 100 MCG/2ML IJ SOLN
INTRAMUSCULAR | Status: DC | PRN
  Administered 2023-05-21: 50 ug via INTRAVENOUS
  Administered 2023-05-21 (×2): 25 ug via INTRAVENOUS

## 2023-05-21 MED ORDER — PROPOFOL 10 MG/ML IV BOLUS
INTRAVENOUS | Status: DC | PRN
  Administered 2023-05-21: 200 mg via INTRAVENOUS

## 2023-05-21 MED ORDER — FENTANYL CITRATE PF 50 MCG/ML IJ SOSY: 25.0000 ug | PREFILLED_SYRINGE | INTRAMUSCULAR | Status: DC | PRN

## 2023-05-21 MED ORDER — BUPIVACAINE-EPINEPHRINE (PF) 0.5% -1:200000 IJ SOLN
INTRAMUSCULAR | Status: AC
  Filled 2023-05-21: qty 30

## 2023-05-21 MED ORDER — MIDAZOLAM HCL 2 MG/2ML IJ SOLN
INTRAMUSCULAR | Status: AC
  Filled 2023-05-21: qty 2

## 2023-05-21 MED ORDER — LIDOCAINE 2% (20 MG/ML) 5 ML SYRINGE
INTRAMUSCULAR | Status: DC | PRN
  Administered 2023-05-21: 60 mg via INTRAVENOUS

## 2023-05-21 MED ORDER — DEXAMETHASONE SODIUM PHOSPHATE 10 MG/ML IJ SOLN
INTRAMUSCULAR | Status: DC | PRN
  Administered 2023-05-21: 4 mg via INTRAVENOUS

## 2023-05-21 MED ORDER — ONDANSETRON HCL 4 MG/2ML IJ SOLN
INTRAMUSCULAR | Status: AC
  Filled 2023-05-21: qty 2

## 2023-05-21 MED ORDER — 0.9 % SODIUM CHLORIDE (POUR BTL) OPTIME
TOPICAL | Status: DC | PRN
  Administered 2023-05-21: 1000 mL

## 2023-05-21 MED ORDER — PROPOFOL 10 MG/ML IV BOLUS
INTRAVENOUS | Status: AC
  Filled 2023-05-21: qty 20

## 2023-05-21 MED ORDER — PHENYLEPHRINE 80 MCG/ML (10ML) SYRINGE FOR IV PUSH (FOR BLOOD PRESSURE SUPPORT)
PREFILLED_SYRINGE | INTRAVENOUS | Status: AC
  Filled 2023-05-21: qty 10

## 2023-05-21 MED ORDER — DEXAMETHASONE SODIUM PHOSPHATE 10 MG/ML IJ SOLN
INTRAMUSCULAR | Status: AC
  Filled 2023-05-21: qty 1

## 2023-05-21 MED ORDER — LIDOCAINE HCL (PF) 2 % IJ SOLN
INTRAMUSCULAR | Status: AC
  Filled 2023-05-21: qty 5

## 2023-05-21 MED ORDER — ONDANSETRON HCL 4 MG/2ML IJ SOLN
INTRAMUSCULAR | Status: DC | PRN
  Administered 2023-05-21: 4 mg via INTRAVENOUS

## 2023-05-21 MED ORDER — LACTATED RINGERS IV SOLN: INTRAVENOUS | Status: DC

## 2023-05-21 MED ORDER — MIDAZOLAM HCL 5 MG/5ML IJ SOLN
INTRAMUSCULAR | Status: DC | PRN
  Administered 2023-05-21 (×2): 1 mg via INTRAVENOUS

## 2023-05-21 MED ORDER — ACETAMINOPHEN 500 MG PO TABS
ORAL_TABLET | ORAL | Status: AC
  Filled 2023-05-21: qty 2

## 2023-05-21 MED ORDER — PHENYLEPHRINE 80 MCG/ML (10ML) SYRINGE FOR IV PUSH (FOR BLOOD PRESSURE SUPPORT)
PREFILLED_SYRINGE | INTRAVENOUS | Status: DC | PRN
  Administered 2023-05-21 (×4): 80 ug via INTRAVENOUS

## 2023-05-21 MED ORDER — ORAL CARE MOUTH RINSE: 15.0000 mL | Freq: Once | OROMUCOSAL | Status: AC

## 2023-05-21 MED ORDER — FENTANYL CITRATE (PF) 100 MCG/2ML IJ SOLN
INTRAMUSCULAR | Status: AC
  Filled 2023-05-21: qty 2

## 2023-05-21 MED ORDER — SODIUM CHLORIDE 0.9 % IR SOLN
Status: DC | PRN
  Administered 2023-05-21: 3000 mL

## 2023-05-21 MED ORDER — KETOROLAC TROMETHAMINE 15 MG/ML IJ SOLN
15.0000 mg | Freq: Four times a day (QID) | INTRAMUSCULAR | Status: DC
  Administered 2023-05-21 – 2023-05-25 (×16): 15 mg via INTRAVENOUS
  Filled 2023-05-21 (×16): qty 1

## 2023-05-21 SURGICAL SUPPLY — 44 items
AID PSTN UNV HD RSTRNT DISP (MISCELLANEOUS) ×1
ALCOHOL 70% 16 OZ (MISCELLANEOUS) ×1 IMPLANT
BAG COUNTER SPONGE SURGICOUNT (BAG) ×1 IMPLANT
BAG SPNG CNTER NS LX DISP (BAG)
BNDG CMPR 5X4 CHSV STRCH STRL (GAUZE/BANDAGES/DRESSINGS)
BNDG COHESIVE 4X5 TAN STRL LF (GAUZE/BANDAGES/DRESSINGS) IMPLANT
CNTNR URN SCR LID CUP LEK RST (MISCELLANEOUS) IMPLANT
CONT SPEC 4OZ STRL OR WHT (MISCELLANEOUS) ×3
COVER SURGICAL LIGHT HANDLE (MISCELLANEOUS) ×1 IMPLANT
DRAIN PENROSE 0.25X18 (DRAIN) IMPLANT
DRAPE C-ARM 42X120 X-RAY (DRAPES) IMPLANT
DRAPE IMP U-DRAPE 54X76 (DRAPES) ×1 IMPLANT
DRAPE U-SHAPE 47X51 STRL (DRAPES) ×1 IMPLANT
DURAPREP 26ML APPLICATOR (WOUND CARE) ×1 IMPLANT
ELECT REM PT RETURN 15FT ADLT (MISCELLANEOUS) ×1 IMPLANT
EVACUATOR 1/8 PVC DRAIN (DRAIN) IMPLANT
GAUZE PAD ABD 8X10 STRL (GAUZE/BANDAGES/DRESSINGS) IMPLANT
GLOVE BIO SURGEON STRL SZ7.5 (GLOVE) ×1 IMPLANT
GLOVE INDICATOR 7.5 STRL GRN (GLOVE) ×1 IMPLANT
GOWN STRL REUS W/ TWL XL LVL3 (GOWN DISPOSABLE) ×1 IMPLANT
GOWN STRL REUS W/TWL XL LVL3 (GOWN DISPOSABLE) ×1
KIT BASIN OR (CUSTOM PROCEDURE TRAY) ×1 IMPLANT
KIT TURNOVER KIT B (KITS) ×1 IMPLANT
MANIFOLD NEPTUNE II (INSTRUMENTS) ×1 IMPLANT
NDL HYPO 22X1.5 SAFETY MO (MISCELLANEOUS) IMPLANT
NEEDLE HYPO 22X1.5 SAFETY MO (MISCELLANEOUS) IMPLANT
NS IRRIG 1000ML POUR BTL (IV SOLUTION) ×1 IMPLANT
PACK ORTHO EXTREMITY (CUSTOM PROCEDURE TRAY) ×1 IMPLANT
PAD ARMBOARD 7.5X6 YLW CONV (MISCELLANEOUS) ×1 IMPLANT
RESTRAINT HEAD UNIVERSAL NS (MISCELLANEOUS) ×1 IMPLANT
SET CYSTO W/LG BORE CLAMP LF (SET/KITS/TRAYS/PACK) IMPLANT
SPONGE T-LAP 18X18 ~~LOC~~+RFID (SPONGE) ×2 IMPLANT
SPONGE T-LAP 4X18 ~~LOC~~+RFID (SPONGE) IMPLANT
STAPLER VISISTAT 35W (STAPLE) ×1 IMPLANT
STOCKINETTE 4X48 STRL (DRAPES) ×1 IMPLANT
SUCTION TUBE FRAZIER 10FR DISP (SUCTIONS) IMPLANT
SUT ETHILON 2 0 PS N (SUTURE) IMPLANT
SUT VIC AB 0 CT1 18XCR BRD 8 (SUTURE) ×1 IMPLANT
SUT VIC AB 0 CT1 8-18 (SUTURE)
SUT VIC AB 2-0 CT1 18 (SUTURE) ×1 IMPLANT
TOWEL GREEN STERILE (TOWEL DISPOSABLE) ×1 IMPLANT
TOWEL GREEN STERILE FF (TOWEL DISPOSABLE) ×1 IMPLANT
WATER STERILE IRR 1000ML POUR (IV SOLUTION) ×1 IMPLANT
YANKAUER SUCT BULB TIP NO VENT (SUCTIONS) ×1 IMPLANT

## 2023-05-21 NOTE — Plan of Care (Signed)

## 2023-05-21 NOTE — Anesthesia Procedure Notes (Signed)
Procedure Name: LMA Insertion Date/Time: 05/21/2023 5:49 PM  Performed by: Epimenio Sarin, CRNAPre-anesthesia Checklist: Patient identified, Emergency Drugs available, Suction available, Patient being monitored and Timeout performed Patient Re-evaluated:Patient Re-evaluated prior to induction Oxygen Delivery Method: Circle system utilized Preoxygenation: Pre-oxygenation with 100% oxygen Induction Type: IV induction Ventilation: Mask ventilation without difficulty LMA: LMA with gastric port inserted LMA Size: 4.0 Number of attempts: 1 Dental Injury: Teeth and Oropharynx as per pre-operative assessment

## 2023-05-21 NOTE — Plan of Care (Signed)
Orthopedic Plan of Care Note  Patient has a draining right knee septic prepatellar bursitis. I evaluated him yesterday. I spoke with Dr. Lajoyce Corners since the patient has previously been under his care. Dr. Lajoyce Corners is out of town so he asked me to take care of the infection. I am planning on doing an I&D tonight. I plan to obtain cultures at the time of surgery. Surgery will be this evening after I am done in the office. Patient was made NPO this morning and should remain NPO in anticipation of surgery.   London Sheer, MD Orthopedic Surgeon

## 2023-05-21 NOTE — Op Note (Signed)
Orthopedic Surgery Operative Report  Procedure: Right knee irrigation and debridement to the level of bone  Modifier: none  Date of procedure: 05/21/2023  Patient name: Curtis Hodge MRN: [id number] DOB: 1966/01/18  Surgeon: Willia Craze, MD Assistant: None Pre-operative diagnosis: right knee prepatellar septic bursitis Post-operative diagnosis: same as above Findings: Purulent bloody fluid within the prepatellar space, open draining wound over the anterior lateral aspect of the knee  Specimens: 3 -sent to microbiology for AFB, fungal, anaerobic, aerobic Anesthesia: general EBL: 50mL Complications: None Pre-incision antibiotic: Has been on scheduled antibiotic, no preincision antibiotic given  Implants: none   Indication for procedure: Patient is a 57 y.o. male who is a patient of my partner, Dr. Lajoyce Corners, and was admitted to [facility] for worsening pain and redness around his right knee above his BKA.  Initially, he was getting treated for cellulitis.  However, he developed a fluid pocket anterior to the knee that spontaneously started draining from the anterior lateral aspect.  An MRI was obtained by the medicine team that showed a large anterior fluid collection in the prepatellar space.  The medicine attending called me to evaluate the patient after the MRI was completed.  I saw the patient yesterday and told him that I felt given the spontaneous drainage with purulent appearance that formal debridement was warranted to collect specimen and promote clearance of the infection.  I talked with my partner, Dr. Lajoyce Corners, since he has his patient.  Dr. Lajoyce Corners was out of town and asked me to do the debridement, so plans were made to perform debridement on 05/21/2023.    Procedure Description: The patient was met in the pre-operative holding area. The patient's identity and consent were verified. The operative site was marked.  I went over the risks again of surgery which included but  were not limited to persistent pain, persistent infection, wound dehiscence, DVT/PE, death, need for additional procedures, bleeding were covered with the patient. The patient's remaining questions about the surgery were answered. The patient was brought back to the operating room. General anesthesia was induced and LMA was placed by the anesthesia staff. The patient was transferred to the OR table in the supine position. All bony prominences were well padded. The patient's skin was then prepped and draped in a standard, sterile fashion. A time out was performed that identified the patient, the procedure, and the laterality. All team members agreed with what was stated in the time out.   The skin edges around the spontaneously draining wound were sharply excised with a knife.  The wound was then extended both cranially and caudally.  There was a loose fat seen in the wound at this time.  A rondure was used to debride this fat.  There is also some loose purulent bloody material which was collected and placed into a specimen cup.  Further loose fat was collected and placed in the same specimen cup with around her.  A curette was used to debride the prepatellar space.  Periodically some of the loose material was collected and placed into a specimen cup.  The curette was used to debride on the anterior surface of the patella bone.  It was also used debride deep to the skin within the prepatellar space.  Once all loose and necrotic tissue was removed, the wound was irrigated with 3 L of sterile saline via cystoscopy tubing.  At this time, there is no remaining loose, necrotic, or purulent material within the surgical site.  Hemostasis was  obtained.  A medium Hemovac drain was placed through the wound out the skin cranial to the knee.  The wound was closed with 2-0 nylon suture in a horizontal mattress fashion.  The wound was dressed with Xeroform, gauze, and an Ace wrap.  A total of 3 specimen cups were collected and  sent to the lab.  All counts were correct at the end of the case.  The patient was transferred back to a hospital bed and brought to the post anesthesia care unit by anesthesia staff in stable condition.  Post-operative plan: The patient will recover in the post-anesthesia care unit and then go to the floor on the medicine service. The patient can continue his scheduled antibiotics.  He should work with physical therapy determine how he can mobilize postoperatively.  He will be nonweightbearing on the right lower extremity as the wound heals.  The drain will be removed when output slows to essentially less than 5 over a shift.  He is okay to resume a diet and is okay for DVT prophylaxis. The patient's disposition will be determined by the medicine service.  Willia Craze, MD Orthopedic Surgeon

## 2023-05-21 NOTE — Anesthesia Preprocedure Evaluation (Signed)
Anesthesia Evaluation  Patient identified by MRN, date of birth, ID band Patient awake    Reviewed: Allergy & Precautions, NPO status , Patient's Chart, lab work & pertinent test results  Airway Mallampati: II  TM Distance: >3 FB Neck ROM: Full    Dental   Pulmonary COPD, Current Smoker and Patient abstained from smoking.   breath sounds clear to auscultation       Cardiovascular hypertension,  Rhythm:Regular Rate:Normal     Neuro/Psych  PSYCHIATRIC DISORDERS      negative neurological ROS     GI/Hepatic negative GI ROS,,,(+) Hepatitis -  Endo/Other  negative endocrine ROS    Renal/GU negative Renal ROS     Musculoskeletal  (+) Arthritis ,    Abdominal   Peds  Hematology negative hematology ROS (+) HIV  Anesthesia Other Findings   Reproductive/Obstetrics                             Anesthesia Physical Anesthesia Plan  ASA: 3  Anesthesia Plan: General   Post-op Pain Management: Gabapentin PO (pre-op)*, Tylenol PO (pre-op)* and Toradol IV (intra-op)*   Induction: Intravenous  PONV Risk Score and Plan: 1 and Dexamethasone, Ondansetron and Treatment may vary due to age or medical condition  Airway Management Planned: LMA  Additional Equipment:   Intra-op Plan:   Post-operative Plan: Extubation in OR  Informed Consent: I have reviewed the patients History and Physical, chart, labs and discussed the procedure including the risks, benefits and alternatives for the proposed anesthesia with the patient or authorized representative who has indicated his/her understanding and acceptance.     Dental advisory given  Plan Discussed with: CRNA  Anesthesia Plan Comments:         Anesthesia Quick Evaluation

## 2023-05-21 NOTE — Progress Notes (Signed)
PROGRESS NOTE    Curtis Hodge  ZOX:[id number] DOB: Aug 12, 1966 DOA: 05/15/2023 PCP: Hoy Register, MD  Chief Complaint  Patient presents with   Insect Bite    Brief Narrative:   57 y.o. male with medical history significant of COPD, hepatitis, HIV, schizophrenia,  substance use disorder, hx of OM s/p right BKA in 11/2022 who presented with concerns for infection in his BKA stump after he had an insect bite on Sunday -- saw ortho 6/25, got rx for keflex - failed keflex  On broad spectrum abx, MRI 7/1 with complex subcutaneous fluid collection.  Seen by Ortho Dr. Christell Constant   See below for additional details   Assessment & Plan:   Principal Problem:   Abscess of knee Active Problems:   Sepsis due to cellulitis (HCC)   Diarrhea   Thrombocytopenia (HCC)   Chronic hepatitis B without delta agent without hepatic coma (HCC)   HIV disease (HCC)   Bullous emphysema (HCC)   Substance abuse (HCC)   Hyperlipidemia   Major depressive disorder  Sepsis due to cellulitis of right lower extremity S/p R Knee washout Dr. Christell Constant 7/3 - orthopedic to come to see him today with regards to subcutaneous fluid collection - CRP 10.7, sed rate 39 - follow blood cultures - Ngx3 days - continue broad spectrum abx with vanc given reported purulent drainage - scheduled toradol, prn oxy/morphine - bowel regimen  Tremor - due to pain, follow  - seems improved   Diarrhea Resolved   Thrombocytopenia (HCC) Likely due to sepsis vs. Liver disease Typically wnl at baseline--from sepsis toxidrome    Chronic hepatitis B without delta agent without hepatic coma (HCC) Liver enzymes stable at baseline.   Recheck INr in am with LFT PLT improved with Rx   HIV disease (HCC) Followed by ID HIV quant 21,300 CD4 count 177 Decreased CD4 (195 in March 2024) Continue Biktarvy and bactrim   Substance abuse (HCC) + Cocaine, THC Declines nicotine patch    Hyperlipidemia Continue crestor, LFTs stable       DVT prophylaxis: SCD Code Status: full Family Communication: none Disposition:   Status is: Inpatient Remains inpatient appropriate because: need for IV abx   Consultants:  ID  Procedures:  none  Antimicrobials:     Subjective:  Seen at PACU Sleepy Underwent surgery  Objective: Vitals:   05/21/23 0537 05/21/23 1326 05/21/23 1852 05/21/23 1900  BP: (!) 149/88 137/89 132/85 128/78  Pulse: 80 66 78 83  Resp: 17 20 10 10   Temp: 98.6 F (37 C) 98.2 F (36.8 C) 98.1 F (36.7 C)   TempSrc: Oral Oral    SpO2: 100% 100% 100% 100%  Weight:      Height:        Intake/Output Summary (Last 24 hours) at 05/21/2023 1919 Last data filed at 05/21/2023 1840 Gross per 24 hour  Intake 1156.77 ml  Output 450 ml  Net 706.77 ml    Filed Weights   05/15/23 1552 05/15/23 1702  Weight: 77.1 kg 77.1 kg    Examination:  Sleepy but rousable in nad--breathing spont at bedside S1 s 2no m Multiple tattooos Abd soft n tnd no rebound no guard R knee wrapped, doesn't look swollen Neuro intact   Data Reviewed: I have personally reviewed following labs and imaging studies  CBC: Recent Labs  Lab 05/15/23 1648 05/16/23 0416 05/18/23 0458 05/19/23 0343 05/20/23 0437  WBC 7.9 7.1 4.0 5.0 4.9  NEUTROABS 6.4  --  2.6 3.3 3.3  HGB 12.5* 11.5* 11.3* 11.7* 11.5*  HCT 37.7* 34.4* 33.6* 35.4* 34.7*  MCV 97.2 97.7 95.7 98.6 96.4  PLT 101* 86* 118* 127* 141*     Basic Metabolic Panel: Recent Labs  Lab 05/15/23 1648 05/16/23 0416 05/18/23 0458 05/19/23 0343 05/20/23 0437  NA 132* 132* 133* 135 130*  K 3.5 4.0 3.5 3.9 4.1  CL 101 101 101 103 100  CO2 25 27 25 26 26   GLUCOSE 97 167* 114* 123* 107*  BUN 10 11 10 10 11   CREATININE 0.97 1.01 0.96 1.08 0.96  CALCIUM 8.2* 8.2* 7.8* 8.0* 7.8*  MG  --  1.9 1.8 1.9 1.9  PHOS  --   --  3.3 3.1 2.6     GFR: Estimated Creatinine Clearance: 88.7 mL/min (by C-G formula based on SCr of 0.96 mg/dL).  Liver Function  Tests: Recent Labs  Lab 05/15/23 1648 05/16/23 0416 05/18/23 0458 05/19/23 0343 05/20/23 0437  AST 71* 58* 70* 62* 70*  ALT 68* 53* 57* 58* 65*  ALKPHOS 88 74 87 102 110  BILITOT 1.5* 1.1 0.5 0.4 0.2*  PROT 7.9 6.7 6.7 6.9 7.1  ALBUMIN 3.0* 2.5* 2.3* 2.4* 2.4*     CBG: No results for input(s): "GLUCAP" in the last 168 hours.    Scheduled Meds:  acetaminophen       [MAR Hold] atorvastatin  40 mg Oral Daily   [MAR Hold] bictegravir-emtricitabine-tenofovir AF  1 tablet Oral Daily   [MAR Hold] Chlorhexidine Gluconate Cloth  6 each Topical Q0600   [MAR Hold] gabapentin  300 mg Oral TID   [MAR Hold] mupirocin ointment  1 Application Nasal BID   [MAR Hold] pantoprazole  40 mg Oral Daily   [MAR Hold] sulfamethoxazole-trimethoprim  1 tablet Oral Daily   Continuous Infusions:  [MAR Hold] cefTRIAXone (ROCEPHIN)  IV Stopped (05/20/23 2330)   lactated ringers 50 mL/hr at 05/21/23 1742   [MAR Hold] methocarbamol (ROBAXIN) IV 500 mg (05/18/23 2351)   [MAR Hold] vancomycin 750 mg (05/21/23 1421)     LOS: 6 days    Time spent: 21  Rhetta Mura, MD [facility]   To contact the attending provider between 7A-7P or the covering provider during after hours 7P-7A, please log into the web site [email or web address] and access using universal [place] password for that web site. If you do not have the password, please call the hospital operator.  05/21/2023, 7:19 PM

## 2023-05-21 NOTE — Brief Op Note (Signed)
05/21/2023  6:47 PM  PATIENT:  Curtis Hodge  57 y.o. male  PRE-OPERATIVE DIAGNOSIS:  RIGHT KNEE INFECTION  POST-OPERATIVE DIAGNOSIS:  RIGHT KNEE INFECTION  PROCEDURE:  Procedure(s): IRRIGATION AND DEBRIDEMENT KNEE (Right)  SURGEON:  Surgeon(s) and Role:    London Sheer, MD - Primary  PHYSICIAN ASSISTANT:   ASSISTANTS: none   ANESTHESIA:   general  EBL:  50 mL   BLOOD ADMINISTERED:none  DRAINS:  medium hemovac drain out of right knee    LOCAL MEDICATIONS USED:  NONE  SPECIMEN:  Source of Specimen:  3 from right knee prepatellar space  DISPOSITION OF SPECIMEN:   microbiology  COUNTS:  YES  TOURNIQUET:  None  DICTATION: .Note written in EPIC  PLAN OF CARE: Admit to inpatient   PATIENT DISPOSITION:  PACU - hemodynamically stable.   Delay start of Pharmacological VTE agent (>24hrs) due to surgical blood loss or risk of bleeding: no

## 2023-05-21 NOTE — Transfer of Care (Signed)
Immediate Anesthesia Transfer of Care Note  Patient: Curtis Hodge  Procedure(s) Performed: IRRIGATION AND DEBRIDEMENT KNEE (Right: Knee)  Patient Location: PACU  Anesthesia Type:General  Level of Consciousness: drowsy and patient cooperative  Airway & Oxygen Therapy: Patient Spontanous Breathing and Patient connected to face mask oxygen  Post-op Assessment: Report given to RN and Post -op Vital signs reviewed and stable  Post vital signs: Reviewed and stable  Last Vitals:  Vitals Value Taken Time  BP 132/85 05/21/23 1852  Temp    Pulse 79 05/21/23 1855  Resp 10 05/21/23 1855  SpO2 100 % 05/21/23 1855  Vitals shown include unvalidated device data.  Last Pain:  Vitals:   05/21/23 1503  TempSrc:   PainSc: 6       Patients Stated Pain Goal: 6 (05/21/23 1503)  Complications: No notable events documented.

## 2023-05-21 NOTE — H&P (Signed)
Orthopedic Surgery H&P Update  Patient's history and physical reviewed - no updates at this time. Has a draining prepatellar septic bursitis. No concern for septic joint based on exam.  Risks of surgery were covered again, patient elected to continue with planned procedure Written consent verified Site marked Has been abx, will hold any further prior to incision for specimen collection To OR when ready  Curtis Craze, MD Orthopedic Surgeon

## 2023-05-21 NOTE — Progress Notes (Signed)
Subjective:  More purulence coming from the wound last night    Antibiotics:  Anti-infectives (From admission, onward)    Start     Dose/Rate Route Frequency Ordered Stop   05/20/23 1400  vancomycin (VANCOREADY) IVPB 750 mg/150 mL        750 mg 150 mL/hr over 60 Minutes Intravenous Every 8 hours 05/20/23 0824     05/16/23 2000  cefTRIAXone (ROCEPHIN) 2 g in sodium chloride 0.9 % 100 mL IVPB        2 g 200 mL/hr over 30 Minutes Intravenous Every 24 hours 05/16/23 1835     05/16/23 1000  sulfamethoxazole-trimethoprim (BACTRIM DS) 800-160 MG per tablet 1 tablet        1 tablet Oral Daily 05/15/23 2059     05/16/23 0600  metroNIDAZOLE (FLAGYL) IVPB 500 mg  Status:  Discontinued        500 mg 100 mL/hr over 60 Minutes Intravenous Every 12 hours 05/15/23 2026 05/16/23 0746   05/16/23 0600  vancomycin (VANCOCIN) IVPB 1000 mg/200 mL premix  Status:  Discontinued        1,000 mg 200 mL/hr over 60 Minutes Intravenous Every 12 hours 05/15/23 2035 05/20/23 0824   05/16/23 0130  ceFEPIme (MAXIPIME) 1 g in sodium chloride 0.9 % 100 mL IVPB  Status:  Discontinued        1 g 200 mL/hr over 30 Minutes Intravenous Every 8 hours 05/15/23 2035 05/15/23 2037   05/16/23 0130  ceFEPIme (MAXIPIME) 2 g in sodium chloride 0.9 % 100 mL IVPB  Status:  Discontinued        2 g 200 mL/hr over 30 Minutes Intravenous Every 8 hours 05/15/23 2037 05/16/23 1532   05/15/23 2145  bictegravir-emtricitabine-tenofovir AF (BIKTARVY) 50-200-25 MG per tablet 1 tablet        1 tablet Oral Daily 05/15/23 2059     05/15/23 1700  vancomycin (VANCOREADY) IVPB 1750 mg/350 mL        1,750 mg 175 mL/hr over 120 Minutes Intravenous  Once 05/15/23 1650 05/15/23 2021   05/15/23 1645  ceFEPIme (MAXIPIME) 2 g in sodium chloride 0.9 % 100 mL IVPB        2 g 200 mL/hr over 30 Minutes Intravenous  Once 05/15/23 1644 05/15/23 1741   05/15/23 1645  metroNIDAZOLE (FLAGYL) IVPB 500 mg        500 mg 100 mL/hr over 60 Minutes  Intravenous  Once 05/15/23 1644 05/15/23 1857   05/15/23 1645  vancomycin (VANCOCIN) IVPB 1000 mg/200 mL premix  Status:  Discontinued        1,000 mg 200 mL/hr over 60 Minutes Intravenous  Once 05/15/23 1644 05/15/23 1650       Medications: Scheduled Meds:  atorvastatin  40 mg Oral Daily   bictegravir-emtricitabine-tenofovir AF  1 tablet Oral Daily   Chlorhexidine Gluconate Cloth  6 each Topical Q0600   gabapentin  300 mg Oral TID   ketorolac  15 mg Intravenous Q6H   mupirocin ointment  1 Application Nasal BID   pantoprazole  40 mg Oral Daily   sulfamethoxazole-trimethoprim  1 tablet Oral Daily   Continuous Infusions:  cefTRIAXone (ROCEPHIN)  IV Stopped (05/20/23 2330)   methocarbamol (ROBAXIN) IV 500 mg (05/18/23 2351)   vancomycin 750 mg (05/21/23 0604)   PRN Meds:.acetaminophen **OR** acetaminophen, methocarbamol (ROBAXIN) IV, morphine injection, ondansetron **OR** ondansetron (ZOFRAN) IV, oxyCODONE **OR** oxyCODONE    Objective: Weight change:   Intake/Output Summary (  Last 24 hours) at 05/21/2023 1134 Last data filed at 05/21/2023 1610 Gross per 24 hour  Intake --  Output 200 ml  Net -200 ml    Blood pressure (!) 149/88, pulse 80, temperature 98.6 F (37 C), temperature source Oral, resp. rate 17, height 5\' 10"  (1.778 m), weight 77.1 kg, SpO2 100 %. Temp:  [98.6 F (37 C)-99.2 F (37.3 C)] 98.6 F (37 C) (07/03 0537) Pulse Rate:  [75-80] 80 (07/03 0537) Resp:  [17-18] 17 (07/03 0537) BP: (144-156)/(88-95) 149/88 (07/03 0537) SpO2:  [100 %] 100 % (07/03 0537)  Physical Exam: Physical Exam Constitutional:      Appearance: He is well-developed.  HENT:     Head: Normocephalic and atraumatic.  Eyes:     Conjunctiva/sclera: Conjunctivae normal.  Cardiovascular:     Rate and Rhythm: Normal rate and regular rhythm.  Pulmonary:     Effort: Pulmonary effort is normal. No respiratory distress.     Breath sounds: No wheezing.  Abdominal:     General: There is no  distension.     Palpations: Abdomen is soft.  Musculoskeletal:     Cervical back: Normal range of motion and neck supple.  Skin:    General: Skin is warm and dry.     Findings: No erythema or rash.  Neurological:     General: No focal deficit present.     Mental Status: He is alert and oriented to person, place, and time.  Psychiatric:        Mood and Affect: Mood normal.        Behavior: Behavior normal.        Thought Content: Thought content normal.        Judgment: Judgment normal.     Right knee   05/17/2023:         05/19/2023:      05/20/2023:       CBC:    BMET Recent Labs    05/19/23 0343 05/20/23 0437  NA 135 130*  K 3.9 4.1  CL 103 100  CO2 26 26  GLUCOSE 123* 107*  BUN 10 11  CREATININE 1.08 0.96  CALCIUM 8.0* 7.8*      Liver Panel  Recent Labs    05/19/23 0343 05/20/23 0437  PROT 6.9 7.1  ALBUMIN 2.4* 2.4*  AST 62* 70*  ALT 58* 65*  ALKPHOS 102 110  BILITOT 0.4 0.2*        Sedimentation Rate No results for input(s): "ESRSEDRATE" in the last 72 hours. C-Reactive Protein No results for input(s): "CRP" in the last 72 hours.  Micro Results: Recent Results (from the past 720 hour(s))  Blood Culture (routine x 2)     Status: None   Collection Time: 05/15/23  4:40 PM   Specimen: BLOOD LEFT ARM  Result Value Ref Range Status   Specimen Description   Final    BLOOD LEFT ARM Performed at [facility], [address] [place], [place] [address]    Special Requests   Final    BOTTLES DRAWN AEROBIC AND ANAEROBIC Blood Culture results may not be optimal due to an inadequate volume of blood received in culture bottles Performed at [facility], [address] [place], [place] [address]    Culture   Final    NO GROWTH 5 DAYS Performed at [facility], [address], [place], [place] [address]    Report Status 05/20/2023 FINAL  Final  Blood Culture (routine  x 2)     Status: None    Collection Time: 05/15/23  4:48 PM   Specimen: BLOOD RIGHT ARM  Result Value Ref Range Status   Specimen Description   Final    BLOOD RIGHT ARM Performed at [facility], [address] [place], [place] [address]    Special Requests   Final    BOTTLES DRAWN AEROBIC AND ANAEROBIC Blood Culture results may not be optimal due to an excessive volume of blood received in culture bottles Performed at [facility], [address], [place], [place] [address]    Culture   Final    NO GROWTH 5 DAYS Performed at [facility], [address], [place], [place] [address]    Report Status 05/20/2023 FINAL  Final  Resp panel by RT-PCR (RSV, Flu A&B, Covid) Urine, Clean Catch     Status: None   Collection Time: 05/15/23  5:42 PM   Specimen: Urine, Clean Catch; Nasal Swab  Result Value Ref Range Status   SARS Coronavirus 2 by RT PCR NEGATIVE NEGATIVE Final    Comment: (NOTE) SARS-CoV-2 target nucleic acids are NOT DETECTED.  The SARS-CoV-2 RNA is generally detectable in upper respiratory specimens during the acute phase of infection. The lowest concentration of SARS-CoV-2 viral copies this assay can detect is 138 copies/mL. A negative result does not preclude SARS-Cov-2 infection and should not be used as the sole basis for treatment or other patient management decisions. A negative result may occur with  improper specimen collection/handling, submission of specimen other than nasopharyngeal swab, presence of viral mutation(s) within the areas targeted by this assay, and inadequate number of viral copies(<138 copies/mL). A negative result must be combined with clinical observations, patient history, and epidemiological information. The expected result is Negative.  Fact Sheet for Patients:  BloggerCourse.com  Fact Sheet for Healthcare Providers:  SeriousBroker.it  This test is no t yet approved or cleared by the  [place] FDA and  has been authorized for detection and/or diagnosis of SARS-CoV-2 by FDA under an Emergency Use Authorization (EUA). This EUA will remain  in effect (meaning this test can be used) for the duration of the COVID-19 declaration under Section 564(b)(1) of the Act, 21 U.S.C.section 360bbb-3(b)(1), unless the authorization is terminated  or revoked sooner.       Influenza A by PCR NEGATIVE NEGATIVE Final   Influenza B by PCR NEGATIVE NEGATIVE Final    Comment: (NOTE) The Xpert Xpress SARS-CoV-2/FLU/RSV plus assay is intended as an aid in the diagnosis of influenza from Nasopharyngeal swab specimens and should not be used as a sole basis for treatment. Nasal washings and aspirates are unacceptable for Xpert Xpress SARS-CoV-2/FLU/RSV testing.  Fact Sheet for Patients: BloggerCourse.com  Fact Sheet for Healthcare Providers: SeriousBroker.it  This test is not yet approved or cleared by the [place] FDA and has been authorized for detection and/or diagnosis of SARS-CoV-2 by FDA under an Emergency Use Authorization (EUA). This EUA will remain in effect (meaning this test can be used) for the duration of the COVID-19 declaration under Section 564(b)(1) of the Act, 21 U.S.C. section 360bbb-3(b)(1), unless the authorization is terminated or revoked.     Resp Syncytial Virus by PCR NEGATIVE NEGATIVE Final    Comment: (NOTE) Fact Sheet for Patients: BloggerCourse.com  Fact Sheet for Healthcare Providers: SeriousBroker.it  This test is not yet approved or cleared by the [place] FDA and has been authorized for detection and/or diagnosis of  SARS-CoV-2 by FDA under an Emergency Use Authorization (EUA). This EUA will remain in effect (meaning this test can be used) for the duration of the COVID-19 declaration under Section 564(b)(1) of the Act, 21 U.S.C. section  360bbb-3(b)(1), unless the authorization is terminated or revoked.  Performed at [facility], [address] [place], [place] [address]   MRSA Next Gen by PCR, Nasal     Status: Abnormal   Collection Time: 05/17/23 11:39 AM   Specimen: Nasal Mucosa; Nasal Swab  Result Value Ref Range Status   MRSA by PCR Next Gen DETECTED (A) NOT DETECTED Final    Comment: (NOTE) The GeneXpert MRSA Assay (FDA approved for NASAL specimens only), is one component of a comprehensive MRSA colonization surveillance program. It is not intended to diagnose MRSA infection nor to guide or monitor treatment for MRSA infections. Test performance is not FDA approved in patients less than 74 years old. Performed at [facility], [address], [place], [place] [address]     Studies/Results: No results found.    Assessment/Plan:  INTERVAL HISTORY: he is to go for surgery today I believe at [facility]   Principal Problem:   Abscess of knee Active Problems:   Chronic hepatitis B without delta agent without hepatic coma (HCC)   HIV disease (HCC)   Bullous emphysema (HCC)   Substance abuse (HCC)   Hyperlipidemia   Sepsis due to cellulitis (HCC)   Diarrhea   Thrombocytopenia (HCC)   Major depressive disorder    Curtis Hodge is a 57 y.o. male with  HIV and hepatitis co infection well known to me from having seen him (sporadically) in the clinic and during recent specialization's who had osteomyelitis requiring below the knee amputation.  He has now been admitted with acute swelling of knee and tenderness that he states began with a suspected "insect bite" on top of his knee.  He was admitted to the hospital on 27 June.  Blood cultures were taken which have failed to grow any organisms he was treated initially with vancomycin for PMN metronidazole now narrowed to vancomycin and ceftriaxone.  He had CT on admission which failed to show evidence of septic  arthritis.  MRI showed  IMPRESSION: 1. Large, complex subcutaneous fluid collection anterolaterally, suspicious for a superficial abscess. Aspiration or drainage of this collection recommended. Surrounding cellulitis has also progressed. 2. Small knee joint effusion with mild nonspecific synovial enhancement, potentially reactive. No findings highly suspicious for septic arthritis or osteomyelitis. Of note, placing an intra-articular needle through the inflamed anterior soft tissues may introduce infection into the joint. 3. The menisci, cruciate and collateral ligaments are intact. 4. The distal ends of the tibial and fibular stumps are not visualized status post below the knee amputation.    Last night.he has had purulence coming from the wound the last couple days.  Still think he needs surgery with orthopedics and I am grateful that appears to be planned for today    Will continue vancomycin ceftriaxone for now and hopefully there will be some cultures in the operating room to help guide narrow more   HIV/AIDS  Continue Biktarvy and Bactrim   Will to ensure that he has a 30-day supply of Biktarvy at discharge.  Housing issues: He currently lives in a house --with better social circumstances that what he described to me previously.  He is concerned however that with his active cellulitis and inability to wear a prosthesis anytime soon he  may not be able to get around well at home.  He also believes he needs a motorized wheelchair which I think sounds quite reasonable.  Does have a cell phone but only has service when he is connected to Wi-Fi.  I will make sure that he has a time and date of followup appt at discharge.  Depression and hx of substance abuse: former worse in context of loss of his mother who died while in same room number at [facility] and who died on same day he underwent his BKA. He says he has PTSD from being at [facility].   I have personally spent 52  minutes involved in face-to-face and non-face-to-face activities for this patient on the day of the visit. Professional time spent includes the following activities: Preparing to see the patient (review of tests), Obtaining and/or reviewing separately obtained history (admission/discharge record), Performing a medically appropriate examination and/or evaluation , Ordering medications/tests/procedures, referring and communicating with other health care professionals, Documenting clinical information in the EMR, Independently interpreting results (not separately reported), Communicating results to the patient/family/caregiver, Counseling and educating the patient/family/caregiver and Care coordination (not separately reported).   I am on call for the health care system over the 4th of July weekend and will check in on him at least once over this time and certainly follow his cultures closely.      LOS: 6 days   Acey Lav 05/21/2023, 11:34 AM

## 2023-05-21 NOTE — Progress Notes (Signed)
Mobility Specialist - Progress Note   05/21/23 1130  Mobility  Activity Ambulated with assistance in hallway  Level of Assistance Standby assist, set-up cues, supervision of patient - no hands on  Assistive Device Front wheel walker  Distance Ambulated (ft) 350 ft  Activity Response Tolerated well  Mobility Referral Yes  $Mobility charge 1 Mobility  Mobility Specialist Start Time (ACUTE ONLY) 1100  Mobility Specialist Stop Time (ACUTE ONLY) 1122  Mobility Specialist Time Calculation (min) (ACUTE ONLY) 22 min   Pt received in bed and agreeable to mobility. No complaints during session. Pt to bed after session with all needs met.    [facility]

## 2023-05-22 ENCOUNTER — Encounter (HOSPITAL_COMMUNITY): Payer: Self-pay | Admitting: Orthopedic Surgery

## 2023-05-22 DIAGNOSIS — L02419 Cutaneous abscess of limb, unspecified: Secondary | ICD-10-CM | POA: Diagnosis not present

## 2023-05-22 LAB — COMPREHENSIVE METABOLIC PANEL
ALT: 70 U/L — ABNORMAL HIGH (ref 0–44)
AST: 65 U/L — ABNORMAL HIGH (ref 15–41)
Albumin: 2.6 g/dL — ABNORMAL LOW (ref 3.5–5.0)
Alkaline Phosphatase: 111 U/L (ref 38–126)
Anion gap: 9 (ref 5–15)
BUN: 15 mg/dL (ref 6–20)
CO2: 22 mmol/L (ref 22–32)
Calcium: 8.4 mg/dL — ABNORMAL LOW (ref 8.9–10.3)
Chloride: 100 mmol/L (ref 98–111)
Creatinine, Ser: 0.98 mg/dL (ref 0.61–1.24)
GFR, Estimated: >60 mL/min (ref >60)
Glucose, Bld: 154 mg/dL — ABNORMAL HIGH (ref 70–99)
Potassium: 4.6 mmol/L (ref 3.5–5.1)
Sodium: 131 mmol/L — ABNORMAL LOW (ref 135–145)
Total Bilirubin: 0.4 mg/dL (ref 0.3–1.2)
Total Protein: 7.7 g/dL (ref 6.5–8.1)

## 2023-05-22 LAB — CBC
HCT: 32.9 % — ABNORMAL LOW (ref 39.0–52.0)
Hemoglobin: 10.8 g/dL — ABNORMAL LOW (ref 13.0–17.0)
MCH: 32 pg (ref 26.0–34.0)
MCHC: 32.8 g/dL (ref 30.0–36.0)
MCV: 97.3 fL (ref 80.0–100.0)
Platelets: 187 10*3/uL (ref 150–400)
RBC: 3.38 MIL/uL — ABNORMAL LOW (ref 4.22–5.81)
RDW: 12.9 % (ref 11.5–15.5)
WBC: 5 10*3/uL (ref 4.0–10.5)
nRBC: 0 % (ref 0.0–0.2)

## 2023-05-22 LAB — AEROBIC/ANAEROBIC CULTURE W GRAM STAIN (SURGICAL/DEEP WOUND)

## 2023-05-22 LAB — PROTIME-INR
INR: 1.2 (ref 0.8–1.2)
Prothrombin Time: 15.2 seconds (ref 11.4–15.2)

## 2023-05-22 NOTE — Progress Notes (Signed)
Upon entering pt room to administer medications, this RN noted dressing to be removed from RLE. Patient was fidgeting with drain as well. This RN educated pt as to the importance of keeping a sterile dressing in place, and not manipulating the hemovac. Leg was re-dressed at this time as well. Ortho MD as well as TRH made aware of above.

## 2023-05-22 NOTE — Progress Notes (Signed)
Patient called and when I entered the room, I saw the patient pulling the hemovac cord. He pulled the cord out and tried to blow it. Patient stated that it is clogged up and needs to be drained well. Told the patient that he should not pull it and hemovac needs to be kept on suction.
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
## 2023-05-22 NOTE — Progress Notes (Signed)
   05/22/23 1201  Spiritual Encounters  Type of Visit Initial  Care provided to: Patient  Referral source Nurse (RN/NT/LPN);Patient request  Reason for visit Routine spiritual support  OnCall Visit No  Interventions  Spiritual Care Interventions Made Established relationship of care and support;Compassionate presence;Reflective listening;Other (comment) (provided a shirt for patient)   Chaplain responded to a page for patient who needed clothing. Chaplain provided a shirt for patient. Patient requested shorts if available. Patient needs pants/shorts regardless. Patient also requested reading glasses and a Bible.   Arlyce Dice, Chaplain Resident

## 2023-05-22 NOTE — Progress Notes (Signed)
Orthopedic Surgery Progress Note   Assessment: Patient is a 57 y.o. male with right knee prepatellar septic bursitis s/p I&D   Plan: -Operative plans: complete -Okay for diet and dvt ppx from ortho perspective -Non weight bearing right lower extremity -Drain to be maintained for the next couple of days -Intra-operative cultures showing GP+ cocci -Antibiotics: per ID -PT/OT evaluate and treat -Pain control -Dispo: remain floor status  ___________________________________________________________________________  Subjective: Patient took down his dressings and was disassembling and tinkering with the drain. No other events overnight. Pain adequately controlled this morning.    Physical Exam:  General: no acute distress, appears stated age Neurologic: alert, answering questions appropriately, following commands Respiratory: unlabored breathing on room air, symmetric chest rise  MSK:   -Right lower extremity  Dressings c/d/I, drain with minimal serosanguinous output Flexes and extends knee BKA stump without any ulcers or open wounds Sensation intact to light touch over the anterior and posterior aspect of residual limb   Patient name: Curtis Hodge Patient MRN: [id number] Date: 05/22/23

## 2023-05-22 NOTE — Anesthesia Postprocedure Evaluation (Signed)
Anesthesia Post Note  Patient: Curtis Hodge  Procedure(s) Performed: IRRIGATION AND DEBRIDEMENT KNEE (Right: Knee)     Patient location during evaluation: PACU Anesthesia Type: General Level of consciousness: awake and alert Pain management: pain level controlled Vital Signs Assessment: post-procedure vital signs reviewed and stable Respiratory status: spontaneous breathing, nonlabored ventilation, respiratory function stable and patient connected to nasal cannula oxygen Cardiovascular status: blood pressure returned to baseline and stable Postop Assessment: no apparent nausea or vomiting Anesthetic complications: no   No notable events documented.  Last Vitals:  Vitals:   05/21/23 1945 05/21/23 2017  BP: (!) 156/77 (!) 146/91  Pulse: 91 87  Resp: 12 18  Temp:  36.7 C  SpO2: 100% 100%    Last Pain:  Vitals:   05/22/23 0131  TempSrc:   PainSc: Asleep                 Kennieth Rad

## 2023-05-22 NOTE — Progress Notes (Signed)
Chaplain brought Curtis Hodge some readers and a Bible at his request.  His brother is coming in tomorrow and will bring him some clothing. He is very excited to see his brother.  His spirits were good and he requested prayer, which chaplain provided.  [address] [place] Pager, [phone]

## 2023-05-23 DIAGNOSIS — L02419 Cutaneous abscess of limb, unspecified: Secondary | ICD-10-CM | POA: Diagnosis not present

## 2023-05-23 LAB — CBC
HCT: 32.7 % — ABNORMAL LOW (ref 39.0–52.0)
Hemoglobin: 10.5 g/dL — ABNORMAL LOW (ref 13.0–17.0)
MCH: 31.4 pg (ref 26.0–34.0)
MCHC: 32.1 g/dL (ref 30.0–36.0)
MCV: 97.9 fL (ref 80.0–100.0)
Platelets: 170 10*3/uL (ref 150–400)
RBC: 3.34 MIL/uL — ABNORMAL LOW (ref 4.22–5.81)
RDW: 13 % (ref 11.5–15.5)
WBC: 8.2 10*3/uL (ref 4.0–10.5)
nRBC: 0 % (ref 0.0–0.2)

## 2023-05-23 LAB — COMPREHENSIVE METABOLIC PANEL
ALT: 60 U/L — ABNORMAL HIGH (ref 0–44)
AST: 45 U/L — ABNORMAL HIGH (ref 15–41)
Albumin: 2.4 g/dL — ABNORMAL LOW (ref 3.5–5.0)
Alkaline Phosphatase: 100 U/L (ref 38–126)
Anion gap: 4 — ABNORMAL LOW (ref 5–15)
BUN: 17 mg/dL (ref 6–20)
CO2: 29 mmol/L (ref 22–32)
Calcium: 8.4 mg/dL — ABNORMAL LOW (ref 8.9–10.3)
Chloride: 102 mmol/L (ref 98–111)
Creatinine, Ser: 1.02 mg/dL (ref 0.61–1.24)
GFR, Estimated: >60 mL/min (ref >60)
Glucose, Bld: 130 mg/dL — ABNORMAL HIGH (ref 70–99)
Potassium: 4.4 mmol/L (ref 3.5–5.1)
Sodium: 135 mmol/L (ref 135–145)
Total Bilirubin: 0.2 mg/dL — ABNORMAL LOW (ref 0.3–1.2)
Total Protein: 6.9 g/dL (ref 6.5–8.1)

## 2023-05-23 LAB — AEROBIC/ANAEROBIC CULTURE W GRAM STAIN (SURGICAL/DEEP WOUND)

## 2023-05-23 MED ORDER — METHOCARBAMOL 500 MG PO TABS
500.0000 mg | ORAL_TABLET | Freq: Three times a day (TID) | ORAL | Status: DC
  Administered 2023-05-23 – 2023-05-26 (×9): 500 mg via ORAL
  Filled 2023-05-23 (×9): qty 1

## 2023-05-23 MED ORDER — ENOXAPARIN SODIUM 40 MG/0.4ML IJ SOSY
40.0000 mg | PREFILLED_SYRINGE | Freq: Every day | INTRAMUSCULAR | Status: DC
  Administered 2023-05-23 – 2023-05-25 (×3): 40 mg via SUBCUTANEOUS
  Filled 2023-05-23 (×4): qty 0.4

## 2023-05-23 NOTE — Progress Notes (Signed)
HOSPITALIST ROUNDING NOTE  Curtis Hodge ZOX:[id number]  DOB: 12-29-1965  DOA: 05/15/2023  PCP: Hoy Register, MD  Code Status: Full code--- from: Home ---  current Dispo: Return home   05/23/2023, 2:53 PM,  LOS: 9 days   57 year old black male Prior osteomyelitis right fifth metatarsal head 11/2022 requiring eventual BKA 11/27/2022- Cocaine tobacco habituation (half pack per day) with underlying COPD HIV previously on Biktarvy/prophylactic Bactrim-VL 76,000 CD4 18 3 months prior to this admission ?  Treated hepatitis B Normocytic anemia Schizophrenia/MDD Normal EF on echo in 2023 Homelessness documented with the past  6/27 admitted with fever from the emergency room right leg pain cellulitis and inability to stand with large amounts of diarrhea additionally Stump appeared warm tender and erythematous with good ROM-Tmax 102.2 tachycardic 100-had a transaminitis thrombocytopenia 101 In ED vancomycin and cefepime Flagyl started 6/28 ID consulted, orthopedics consulted--- blood cultures failed to grow any organism--- ultimately developed purulence from the area on 7/2 7/3 underwent right knee irrigation debridement to the level of bone  Plan  Septic right knee joint status post procedure irrigation debridement to bone 7/3 BCx 2 6/27 negative--deep wound cultures aspirate 7/3 ---vancomycin and ceftriaxone continue NWB per orthopedics--first choice Oxy IR --second choice morphine-for breakthrough Toradol gabapentin 300 3 times daily, IV Robaxin 500 every 8 as needed spasm Ordering electric WC  HIV CD4 count 177/(CD4 percent 15), HIV-1 RNA quant 21,000 Consistent with noncompliance on Biktarvy-appreciate ID expertise-continue Bactrim for PJP prophylaxis Will ask Pharm ID to see if we can get the biktarvy  Thrombocytopenia related to above combination of events  Resolved diarrhea No further workup at this time monitor  Polysubstance abuse THC cocaine counseled to stop smoking stop  cocaine-he does seem to have a place that he lives now  Schizophrenia depression ?outpatient psych follow-up  Hyperlipidemia Stop statin until LFTs resolve   DVT prophylaxis: SCD  Status is: Inpatient Remains inpatient appropriate because:   Requires further management and care    Subjective:  Quite animated No distress Looks well--feels good, working with therapy No fever  Objective + exam Vitals:   05/22/23 1106 05/22/23 2202 05/23/23 0520 05/23/23 1239  BP:  135/89 128/67 (!) 149/75  Pulse:  78 60 74  Resp:  20 20 20   Temp: 97.9 F (36.6 C) 97.7 F (36.5 C) 97.7 F (36.5 C) 97.8 F (36.6 C)  TempSrc:  Oral Oral Oral  SpO2:  100% 100% 100%  Weight:      Height:       Filed Weights   05/15/23 1552 05/15/23 1702  Weight: 77.1 kg 77.1 kg    Examination:  Well no distress good dentition Well groomed Cta b no added sound Abd soft nt nd  nor rebound no guard RLE stump wrapped, tattoos in place   Data Reviewed: reviewed   CBC    Component Value Date/Time   WBC 8.2 05/23/2023 0359   RBC 3.34 (L) 05/23/2023 0359   HGB 10.5 (L) 05/23/2023 0359   HCT 32.7 (L) 05/23/2023 0359   PLT 170 05/23/2023 0359   MCV 97.9 05/23/2023 0359   MCH 31.4 05/23/2023 0359   MCHC 32.1 05/23/2023 0359   RDW 13.0 05/23/2023 0359   LYMPHSABS 1.1 05/20/2023 0437   MONOABS 0.3 05/20/2023 0437   EOSABS 0.0 05/20/2023 0437   BASOSABS 0.0 05/20/2023 0437      Latest Ref Rng & Units 05/23/2023    3:59 AM 05/22/2023    3:56 AM 05/20/2023  4:37 AM  CMP  Glucose 70 - 99 mg/dL 161  096  045   BUN 6 - 20 mg/dL 17  15  11    Creatinine 0.61 - 1.24 mg/dL 4.09  8.11  9.14   Sodium 135 - 145 mmol/L 135  131  130   Potassium 3.5 - 5.1 mmol/L 4.4  4.6  4.1   Chloride 98 - 111 mmol/L 102  100  100   CO2 22 - 32 mmol/L 29  22  26    Calcium 8.9 - 10.3 mg/dL 8.4  8.4  7.8   Total Protein 6.5 - 8.1 g/dL 6.9  7.7  7.1   Total Bilirubin 0.3 - 1.2 mg/dL 0.2  0.4  0.2   Alkaline Phos 38 -  126 U/L 100  111  110   AST 15 - 41 U/L 45  65  70   ALT 0 - 44 U/L 60  70  65      Scheduled Meds:  bictegravir-emtricitabine-tenofovir AF  1 tablet Oral Daily   Chlorhexidine Gluconate Cloth  6 each Topical Q0600   enoxaparin (LOVENOX) injection  40 mg Subcutaneous Daily   gabapentin  300 mg Oral TID   ketorolac  15 mg Intravenous Q6H   mupirocin ointment  1 Application Nasal BID   pantoprazole  40 mg Oral Daily   sulfamethoxazole-trimethoprim  1 tablet Oral Daily   Continuous Infusions:  methocarbamol (ROBAXIN) IV 500 mg (05/18/23 2351)   vancomycin 750 mg (05/23/23 1429)    Time 24  Rhetta Mura, MD  [facility]

## 2023-05-23 NOTE — Progress Notes (Addendum)
Pharmacy Antibiotic Note  Davonn Hammontree is a 57 y.o. male admitted on 05/15/2023 with   Sepsis with septic R knee, .  Pharmacy has been consulted for Vanco dosing.   ID: Sepsis with right knee prepatellar septic bursitis, Uncontrolled HIV. - CT R knee: cellulitis, no abscess, no osteo - Ortho not concerned for septic arthritis 6/29 - Now afebrile, WBC WNL, Sed rate 39, CRP 10.7 Sr stable ~ 1 on vanco - 7/3: I&D with noted Purulence from wound  Vanco 6/27>> 6/28 CTX>>  Cefepime 6/27>>6/28 Flagyl 6/27>>6/28 Biktarvy as PTA> Bactrim 1 DS qday as PTA>>  6/27 BCx2: NEG 6/29 HIV RNA: 21,300 down from 3 months ago HIV CD4 count 177/(CD4 percent 15)  6/29 cryptococcal antigen: negative 6/29 MRSA nasal swab: POS + 7/3: Tissue knee 3: rare SA 7/3: Tissue knee: rare SA  Vanco levels: 7/1: 2130 peak 25 7/1: dose given 1809 7/2: 0437 trough 7 AUC 410  Plan: Levels done 7/1 and 7/2 Continued same Vanco to 750mg  IV q8hr for AUC 460  Height: 5\' 10"  (177.8 cm) Weight: 77.1 kg (170 lb) IBW/kg (Calculated) : 73  Temp (24hrs), Avg:97.8 F (36.6 C), Min:97.7 F (36.5 C), Max:97.9 F (36.6 C)  Recent Labs  Lab 05/18/23 0458 05/19/23 0343 05/19/23 2130 05/20/23 0437 05/22/23 0356 05/23/23 0359  WBC 4.0 5.0  --  4.9 5.0 8.2  CREATININE 0.96 1.08  --  0.96 0.98 1.02  VANCOTROUGH  --   --   --  7*  --   --   VANCOPEAK  --   --  25*  --   --   --     Estimated Creatinine Clearance: 83.5 mL/min (by C-G formula based on SCr of 1.02 mg/dL).    Allergies  Allergen Reactions   Bee Pollen     bees   Beet Root [Germanium]     beets    Watson Robarge S. Merilynn Finland, PharmD, BCPS Clinical Staff Pharmacist [email or web address]  Pasty Spillers 05/23/2023 7:51 AM

## 2023-05-23 NOTE — Evaluation (Signed)
Physical Therapy Evaluation Patient Details Name: Curtis Hodge MRN: [id number] DOB: 1966-06-06 Today's Date: 05/23/2023  History of Present Illness  57 yo male admitted with cellulitis, sepsis. s/p I&D R residual limb 05/21/23. Hx of R BKA 11/2022, HIV, Hep B, COPD, schizophrenia, opioid OD, anxiety/depression, R transmet amputation  Clinical Impression  Pt ambulated 250' with RW without loss of balance. Pt had a bolster under R knee at start of session, I advised him to rest in the bed with his knee flat rather than flexed so that he doesn't develop a flexion contraction. Pt reports he's performing knee flexion/extension AROM exercises daily, he was encouraged to continue doing so. He is mobilizing well at a modified independent level, no further acute PT indicated, will sign off.         Assistance Recommended at Discharge PRN  If plan is discharge home, recommend the following:  Can travel by private vehicle  Assist for transportation;Help with stairs or ramp for entrance        Equipment Recommendations None recommended by PT  Recommendations for Other Services       Functional Status Assessment Patient has not had a recent decline in their functional status     Precautions / Restrictions Precautions Precautions: Fall Restrictions Weight Bearing Restrictions: No Other Position/Activity Restrictions: R BKA with cellulitis      Mobility  Bed Mobility Overal bed mobility: Modified Independent                  Transfers Overall transfer level: Modified independent                      Ambulation/Gait Ambulation/Gait assistance: Modified independent (Device/Increase time) Gait Distance (Feet): 250 Feet Assistive device: Rolling walker (2 wheels) Gait Pattern/deviations: Step-to pattern Gait velocity: WFL     General Gait Details: No LOB with RW use.  Stairs            Wheelchair Mobility     Tilt Bed    Modified Rankin (Stroke  Patients Only)       Balance Overall balance assessment: Modified Independent                                           Pertinent Vitals/Pain Pain Assessment Pain Score: 7  Pain Location: R residual limb Pain Descriptors / Indicators: Sore Pain Intervention(s): Limited activity within patient's tolerance, Monitored during session    Home Living Family/patient expects to be discharged to:: Unsure Living Arrangements: Other relatives (cousin) Available Help at Discharge: Available PRN/intermittently   Home Access: Stairs to enter Entrance Stairs-Rails: Right Entrance Stairs-Number of Steps: 5   Home Layout: One level Home Equipment: Agricultural consultant (2 wheels);Cane - single point;Crutches;Wheelchair Financial trader (4 wheels) Additional Comments: Pt reports having a Service dog for COPD; pt reports he has a prosthesis for R    Prior Function Prior Level of Function : Independent/Modified Independent             Mobility Comments: pt reports he has been amublating with prothesis with use of RW vs cane/crutches ADLs Comments: uses wheelchair to get to bus stop, limited time using prosthesis, has service dog and typical dog at home,.     Hand Dominance   Dominant Hand: Right    Extremity/Trunk Assessment   Upper Extremity Assessment RUE Deficits / Details: noted to have  had a pinky finger amputation on this side.    Lower Extremity Assessment RLE Deficits / Details: knee flex to at least 90*, knee ext ~5* AROM    Cervical / Trunk Assessment Cervical / Trunk Assessment: Normal  Communication   Communication: No difficulties  Cognition Arousal/Alertness: Awake/alert Behavior During Therapy: WFL for tasks assessed/performed Overall Cognitive Status: Within Functional Limits for tasks assessed                                          General Comments      Exercises     Assessment/Plan    PT Assessment All further PT  needs can be met in the next venue of care  PT Problem List         PT Treatment Interventions      PT Goals (Current goals can be found in the Care Plan section)  Acute Rehab PT Goals Patient Stated Goal: to get a motorized wheelchair PT Goal Formulation: All assessment and education complete, DC therapy    Frequency       Co-evaluation               AM-PAC PT "6 Clicks" Mobility  Outcome Measure Help needed turning from your back to your side while in a flat bed without using bedrails?: None Help needed moving from lying on your back to sitting on the side of a flat bed without using bedrails?: None Help needed moving to and from a bed to a chair (including a wheelchair)?: None Help needed standing up from a chair using your arms (e.g., wheelchair or bedside chair)?: None Help needed to walk in hospital room?: None Help needed climbing 3-5 steps with a railing? : None 6 Click Score: 24    End of Session Equipment Utilized During Treatment: Gait belt Activity Tolerance: Patient tolerated treatment well Patient left: in bed;with call bell/phone within reach Nurse Communication: Mobility status PT Visit Diagnosis: Pain Pain - Right/Left: Right Pain - part of body: Knee    Time: 1610-9604 PT Time Calculation (min) (ACUTE ONLY): 34 min   Charges:   PT Evaluation $PT Eval Moderate Complexity: 1 Mod PT Treatments $Gait Training: 8-22 mins PT General Charges $$ ACUTE PT VISIT: 1 Visit         Tamala Ser PT 05/23/2023  Acute Rehabilitation Services  Office [phone]

## 2023-05-23 NOTE — Evaluation (Signed)
Occupational Therapy Evaluation and Discharge Patient Details Name: Curtis Hodge MRN: [id number] DOB: [birth year] Today's Date: 05/23/2023   History of Present Illness 57 yo male admitted with cellulitis, sepsis. s/p I&D R residual limb 05/21/23. 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 I do agree with him that a power chair would be more helpful for community mobility and he will need to follow up with his PCP to get this process started. No further OT needs, we will sign off.     Recommendations for follow up therapy are one component of a multi-disciplinary discharge planning process, led by the attending physician.  Recommendations may be updated based on patient status, additional functional criteria and insurance authorization.   Assistance Recommended at Discharge PRN (IADLs)  Patient can return home with the following Assist for transportation    Functional Status Assessment  Patient has had a recent decline in their functional status and demonstrates the ability to make significant improvements in function in a reasonable and predictable amount of time. (without further skilled OT needs)  Equipment Recommendations   (follow up with outpatient physical therapy to establish most appropirate mobility device)       Precautions / Restrictions Precautions Precautions: Fall Restrictions Weight Bearing Restrictions: No Other Position/Activity Restrictions: R BKA with cellulitis      Mobility Bed Mobility               General bed mobility comments: Pt up in recliner upon arrival    Transfers Overall transfer level: Modified independent Equipment used: Rolling walker (2 wheels)                       Balance Overall balance assessment: Modified Independent                                         ADL either performed or assessed with clinical judgement   ADL Overall ADL's : Modified independent                                       General ADL Comments: Pt is not very happy that things cannot happen in a manner that makes sense as far as getting a ramp built, a power wheelchair, a knee scooter, a telephone that works when he needs it to. He is completely mod I from a RW level for basic ADLs and can do some IADLs easier from a wheelchair; however as he explains it, all the bus stops near his home have some kind of hill to them that he would have to push himself up and manage to slow himself down as he returns home thus wearing him out when he has to go out and about for MD appointments or errands for food and basic necessities--thus I can understand his wanting of a power chair. Explained to him that he would need to talk either to his PCP or Dr Lajoyce Corners to help faciliate this. I did give him information on a place called [employer]  that may be able to help him with a temporary to permanant ramp at his house.     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     Communication Communication Communication: No difficulties   Cognition Arousal/Alertness: Awake/alert Behavior During Therapy: WFL for tasks assessed/performed Overall Cognitive Status: Within Functional Limits for tasks assessed                                 General Comments: very verbose                Home Living Family/patient expects  to be discharged to:: Private residence Living Arrangements: Other relatives (cousin v. cousin's house) Available Help at Discharge: Available PRN/intermittently Type of Home: House Home Access: Stairs to enter Secretary/administrator of Steps: 5 Entrance Stairs-Rails: Right Home Layout: One level     Bathroom Shower/Tub: Chief Strategy Officer: Standard     Home Equipment: Agricultural consultant (2 wheels);Cane - single point;Crutches;Wheelchair Financial trader (4 wheels)   Additional Comments: Pt reports having a Service dog for COPD; pt reports he has a prosthesis for R      Prior Functioning/Environment Prior Level of Function : Independent/Modified Independent             Mobility Comments: pt reports he has been amublating with prothesis with use of RW vs cane/crutches ADLs Comments: uses wheelchair to get to bus stop, limited time using prosthesis, has service dog and typical dog at home,.                 OT Goals(Current goals can be found in the care plan section) Acute Rehab OT Goals Patient Stated Goal: to be able to get around the community better         AM-PAC OT "6 Clicks" Daily Activity     Outcome Measure Help from another person eating meals?: None Help from another person taking care of personal grooming?: None Help from another person toileting, which includes using toliet, bedpan, or urinal?: None Help from another person bathing (including washing, rinsing, drying)?: None Help from another person to put on and taking off regular upper body clothing?: None Help from another person to put on and taking off regular lower body clothing?: None 6 Click Score: 24   End of Session Equipment Utilized During Treatment: Rolling walker (2 wheels)  Activity Tolerance: Patient tolerated treatment well Patient left: in chair;with call bell/phone within reach                   Time: 1236-1327 OT Time Calculation (min): 51 min Charges:  OT General  Charges $OT Visit: 1 Visit OT Evaluation $OT Re-eval: 1 Re-eval OT Treatments $Self Care/Home Management : 23-37 mins  Lindon Romp OT Acute Rehabilitation Services Office [phone]    Curtis Hodge, [name] 05/23/2023, 3:10 PM

## 2023-05-24 DIAGNOSIS — L02419 Cutaneous abscess of limb, unspecified: Secondary | ICD-10-CM | POA: Diagnosis not present

## 2023-05-24 LAB — CBC
HCT: 31 % — ABNORMAL LOW (ref 39.0–52.0)
Hemoglobin: 9.9 g/dL — ABNORMAL LOW (ref 13.0–17.0)
MCH: 31.9 pg (ref 26.0–34.0)
MCHC: 31.9 g/dL (ref 30.0–36.0)
MCV: 100 fL (ref 80.0–100.0)
Platelets: 157 10*3/uL (ref 150–400)
RBC: 3.1 MIL/uL — ABNORMAL LOW (ref 4.22–5.81)
RDW: 13.3 % (ref 11.5–15.5)
WBC: 4.9 10*3/uL (ref 4.0–10.5)
nRBC: 0 % (ref 0.0–0.2)

## 2023-05-24 LAB — ACID FAST SMEAR (AFB, MYCOBACTERIA)
Acid Fast Smear: NEGATIVE
Acid Fast Smear: NEGATIVE
Acid Fast Smear: NEGATIVE

## 2023-05-24 LAB — COMPREHENSIVE METABOLIC PANEL
ALT: 54 U/L — ABNORMAL HIGH (ref 0–44)
AST: 36 U/L (ref 15–41)
Albumin: 2.2 g/dL — ABNORMAL LOW (ref 3.5–5.0)
Alkaline Phosphatase: 94 U/L (ref 38–126)
Anion gap: 6 (ref 5–15)
BUN: 16 mg/dL (ref 6–20)
CO2: 24 mmol/L (ref 22–32)
Calcium: 8.3 mg/dL — ABNORMAL LOW (ref 8.9–10.3)
Chloride: 106 mmol/L (ref 98–111)
Creatinine, Ser: 1.03 mg/dL (ref 0.61–1.24)
GFR, Estimated: >60 mL/min (ref >60)
Glucose, Bld: 131 mg/dL — ABNORMAL HIGH (ref 70–99)
Potassium: 4.2 mmol/L (ref 3.5–5.1)
Sodium: 136 mmol/L (ref 135–145)
Total Bilirubin: 0.3 mg/dL (ref 0.3–1.2)
Total Protein: 6.6 g/dL (ref 6.5–8.1)

## 2023-05-24 LAB — AEROBIC/ANAEROBIC CULTURE W GRAM STAIN (SURGICAL/DEEP WOUND)

## 2023-05-24 MED ORDER — DOXYCYCLINE HYCLATE 100 MG PO TABS
100.0000 mg | ORAL_TABLET | Freq: Two times a day (BID) | ORAL | Status: DC
  Administered 2023-05-24 – 2023-05-26 (×4): 100 mg via ORAL
  Filled 2023-05-24 (×4): qty 1

## 2023-05-24 NOTE — Progress Notes (Signed)
Orthopedic Surgery Progress Note   Assessment: Patient is a 57 y.o. male with right knee prepatellar septic bursitis s/p I&D   Plan: -Operative plans: complete -Okay for diet and dvt ppx from ortho perspective -Non weight bearing right lower extremity -Will plan to remove drain 7/08 -Intra-operative cultures showing Staph aureus -Antibiotics: per ID -PT/OT evaluate and treat -Pain control -Okay for discharge from ortho perspective once drain removed on 7/08  ___________________________________________________________________________  Subjective: No acute events overnight. Pain well controlled. Has pain in his right axilla when touching the distal and lateral aspect of the right residual limb. Has not tinkered with the drain or dressings since we spoke on 7/04.   Physical Exam:  General: no acute distress, appears stated age Neurologic: alert, answering questions appropriately, following commands Respiratory: unlabored breathing on room air, symmetric chest rise  MSK:   -Right lower extremity  Dressings c/d/I, drain with minimal serous output Flexes and extends knee BKA stump without any ulcers or open wounds Sensation intact to light touch over the anterior and posterior aspect of residual limb   Patient name: Curtis Hodge Patient MRN: [id number] Date: 05/24/23

## 2023-05-24 NOTE — Progress Notes (Signed)
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  Treated hepatitis B Normocytic anemia Schizophrenia/MDD Normal EF on echo in 2023 Homelessness documented with the past  6/27 admitted with fever from the emergency room right leg pain cellulitis and inability to stand with large amounts of diarrhea additionally Stump appeared warm tender and erythematous with good ROM-Tmax 102.2 tachycardic 100-had a transaminitis thrombocytopenia 101 In ED vancomycin and cefepime Flagyl started 6/28 ID consulted, orthopedics consulted--- blood cultures failed to grow any organism--- ultimately developed purulence from the area on 7/2 7/3 underwent right knee irrigation debridement to the level of bone  Plan  Septic right knee joint status post procedure irrigation debridement to bone 7/3 BCx 2 6/27 negative--deep wound cultures aspirate 7/3 ---vancomycin and ceftriaxone-->doxy for 3 weeks total--discussed with Dr. Algis Liming Wound drain to be removed by Dr. Christell Constant on 7/8 and if all stable can likely discharge on that date per Ortho -first choice Oxy IR --second choice morphine-for breakthrough Toradol gabapentin 300 3 times daily, IV Robaxin 500 every 8 as needed spasm--- will need to start de-escalating IV pain meds tomorrow and will discuss this with him Ordering electric WC-await delivery  HIV CD4 count 177/(CD4 percent 15), HIV-1 RNA quant 21,000 Consistent with noncompliance on Biktarvy-appreciate ID expertise-continue Bactrim for PJP prophylaxis ID  pharmacist to help obtain Biktarvy 05/26/2023  Thrombocytopenia related to above combination of events  Resolved diarrhea No further workup at this time monitor  Polysubstance abuse THC cocaine counseled to stop smoking stop cocaine-will be discharging to an address  Schizophrenia depression ?outpatient psych follow-up  Hyperlipidemia Stop statin until LFTs resolve   DVT prophylaxis: SCD  Status is: Inpatient Remains inpatient appropriate because:   Requires further management and care    Subjective:  Remains quite animated Perseverates on getting an electric wheelchair No overt pain-eating and drinking fairly well   Objective + exam Vitals:   05/23/23 1239 05/23/23 2029 05/24/23 0539 05/24/23 1058  BP: (!) 149/75 132/74 (!) 142/70 (!) 147/77  Pulse: 74 80 70 84  Resp: 20 20 20 20   Temp: 97.8 F (36.6 C) 99.2 F (37.3 C) 98.9 F (37.2 C) 98.4 F (36.9 C)  TempSrc: Oral Oral Oral Oral  SpO2: 100% 99% 99% 100%  Weight:      Height:       Filed Weights   05/15/23 1552 05/15/23 1702  Weight: 77.1 kg 77.1 kg    Examination:  Well no distress good dentition Well groomed Cta b no added sound Abd soft nt nd  nor rebound no guard RLE stump has drain putting out minimal  sanguinous drainage   Data Reviewed: reviewed   CBC    Component Value Date/Time   WBC 4.9 05/24/2023 0407   RBC 3.10 (L) 05/24/2023 0407   HGB 9.9 (L) 05/24/2023 0407   HCT 31.0 (L) 05/24/2023 0407   PLT 157 05/24/2023 0407   MCV 100.0 05/24/2023 0407   MCH 31.9 05/24/2023 0407   MCHC 31.9 05/24/2023 0407   RDW 13.3 05/24/2023 0407   LYMPHSABS 1.1 05/20/2023 0437  MONOABS 0.3 05/20/2023 0437   EOSABS 0.0 05/20/2023 0437   BASOSABS 0.0 05/20/2023 0437      Latest Ref Rng & Units 05/24/2023    4:07 AM 05/23/2023    3:59 AM 05/22/2023    3:56 AM  CMP  Glucose 70 - 99 mg/dL 914  782  956   BUN 6 - 20 mg/dL 16  17  15    Creatinine 0.61 - 1.24 mg/dL 2.13  0.86  5.78   Sodium 135 -  145 mmol/L 136  135  131   Potassium 3.5 - 5.1 mmol/L 4.2  4.4  4.6   Chloride 98 - 111 mmol/L 106  102  100   CO2 22 - 32 mmol/L 24  29  22    Calcium 8.9 - 10.3 mg/dL 8.3  8.4  8.4   Total Protein 6.5 - 8.1 g/dL 6.6  6.9  7.7   Total Bilirubin 0.3 - 1.2 mg/dL 0.3  0.2  0.4   Alkaline Phos 38 - 126 U/L 94  100  111   AST 15 - 41 U/L 36  45  65   ALT 0 - 44 U/L 54  60  70      Scheduled Meds:  bictegravir-emtricitabine-tenofovir AF  1 tablet Oral Daily   Chlorhexidine Gluconate Cloth  6 each Topical Q0600   doxycycline  100 mg Oral Q12H   enoxaparin (LOVENOX) injection  40 mg Subcutaneous Daily   gabapentin  300 mg Oral TID   ketorolac  15 mg Intravenous Q6H   methocarbamol  500 mg Oral TID   pantoprazole  40 mg Oral Daily   sulfamethoxazole-trimethoprim  1 tablet Oral Daily   Continuous Infusions:    Time 30  Rhetta Mura, MD  [facility]

## 2023-05-24 NOTE — Plan of Care (Signed)
  Problem: Education: Goal: Knowledge of General Education information will improve Description: Including pain rating scale, medication(s)/side effects and non-pharmacologic comfort measures Outcome: Not Progressing   Problem: Health Behavior/Discharge Planning: Goal: Ability to manage health-related needs will improve Outcome: Not Progressing   

## 2023-05-25 DIAGNOSIS — L02419 Cutaneous abscess of limb, unspecified: Secondary | ICD-10-CM | POA: Diagnosis not present

## 2023-05-25 LAB — CREATININE, SERUM
Creatinine, Ser: 1.04 mg/dL (ref 0.61–1.24)
GFR, Estimated: >60 mL/min (ref >60)

## 2023-05-25 MED ORDER — IBUPROFEN 200 MG PO TABS
400.0000 mg | ORAL_TABLET | Freq: Four times a day (QID) | ORAL | Status: DC
  Administered 2023-05-25 – 2023-05-26 (×4): 400 mg via ORAL
  Filled 2023-05-25 (×3): qty 2

## 2023-05-25 MED ORDER — TRAZODONE HCL 50 MG PO TABS
25.0000 mg | ORAL_TABLET | Freq: Once | ORAL | Status: AC | PRN
  Administered 2023-05-25: 25 mg via ORAL
  Filled 2023-05-25: qty 1

## 2023-05-25 NOTE — Progress Notes (Signed)
Mobility Specialist - Progress Note   05/25/23 1150  Mobility  Activity Ambulated with assistance in hallway  Level of Assistance Modified independent, requires aide device or extra time  Assistive Device Front wheel walker  Distance Ambulated (ft) 350 ft  Range of Motion/Exercises Active  Activity Response Tolerated well  Mobility Referral Yes  $Mobility charge 1 Mobility  Mobility Specialist Start Time (ACUTE ONLY) 1127  Mobility Specialist Stop Time (ACUTE ONLY) 1150  Mobility Specialist Time Calculation (min) (ACUTE ONLY) 23 min   Pt was found in bed and agreeable to ambulate. Had no complaints with session and at EOS returned to bed with all needs met.  Billey Chang Mobility Specialist

## 2023-05-25 NOTE — Progress Notes (Signed)
HOSPITALIST ROUNDING NOTE  Fuquan Oswalt [id number]  DOB: [birth year]  DOA: 05/15/2023  PCP: Hoy Register, MD  Code Status: Full code--- from: Home ---  current Dispo: Return home   05/25/2023, 2:55 PM,  LOS: 67 days   57 year old black male 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  Treated hepatitis B Normocytic anemia Schizophrenia/MDD Normal EF on echo in 2023 Homelessness documented with the past  6/27 admitted with fever from the emergency room right leg pain cellulitis and inability to stand with large amounts of diarrhea additionally Stump warm tender and erythematous with good ROM-Tmax 102.2 tachycardic 100-had a transaminitis thrombocytopenia 101--vancomycin and cefepime Flagyl started 6/28 ID consulted, orthopedics consulted--- blood cultures failed to grow any organism--- ultimately developed purulence from the area on 7/2 7/3 underwent right knee irrigation debridement to the level of bone 7/6 abx narrow to doxy  Plan  Septic right knee joint status post procedure irrigation debridement to bone 7/3 BCx 2 6/27 negative--deep wound cultures aspirate 7/3 cult neg---vancomycin and ceftriaxone-->doxy for 3 weeks total ending 7/23 [day 1 being 7/3]-discussed with Dr. Algis Liming Wound drain to be removed by Dr. Christell Constant on 7/8 and if all stable can likely discharge Schedule ibu 400 q6-2nd choice Oxy IR - gabapentin 300 3 times daily, po Robaxin 500 every 8 as needed spasm- Ordering electric WC-await delivery  HIV CD4 count 177/(CD4 percent 15), HIV-1 RNA quant 21,000 Consistent with noncompliance on Biktarvy [has a bottle of this at bedside]-appreciate ID expertise-continue Bactrim for PJP prophylaxis  Thrombocytopenia related to above combination of  events  Resolved diarrhea No further workup at this time monitor  Polysubstance abuse THC cocaine counseled to stop smoking stop cocaine-will be discharging to an address He understands to stop  Schizophrenia depression ?outpatient psych follow-up  Hyperlipidemia Stop statin until OP re-eval   DVT prophylaxis: SCD  Status is: Inpatient Remains inpatient appropriate because:   Await ortho oversight    Subjective:  Looking up shoes on his phone to buy Asking about electric WC No new issue Enquires about Motrized WC--needs it as cannot climb hills near his home   Objective + exam Vitals:   05/24/23 2145 05/25/23 0424 05/25/23 1209 05/25/23 1312  BP: (!) 143/78 (!) 147/74 136/73 (!) 142/77  Pulse: 80 75 87 83  Resp: 20 20 18 18   Temp: 99 F (37.2 C) 98.3 F (36.8 C) 98.2 F (36.8 C) 98.1 F (36.7 C)  TempSrc: Oral Oral Oral Oral  SpO2: 99% 100% 100% 100%  Weight:      Height:       Filed Weights   05/15/23 1552 05/15/23 1702  Weight: 77.1 kg 77.1 kg    Examination:  Well no distress good dentition-eomi nat Cta b no added sound Abd soft nt nd  nor rebound no guard RLE stump covered   Data Reviewed: reviewed   CBC    Component Value Date/Time   WBC 4.9 05/24/2023 0407   RBC 3.10 (L) 05/24/2023 0407   HGB 9.9 (L) 05/24/2023 0407   HCT 31.0 (L) 05/24/2023 0407   PLT 157 05/24/2023 0407   MCV 100.0 05/24/2023 0407   MCH 31.9 05/24/2023 0407   MCHC 31.9 05/24/2023 0407   RDW 13.3 05/24/2023 0407   LYMPHSABS 1.1 05/20/2023 0437   MONOABS 0.3 05/20/2023 0437   EOSABS  0.0 05/20/2023 0437   BASOSABS 0.0 05/20/2023 0437      Latest Ref Rng & Units 05/25/2023    3:41 AM 05/24/2023    4:07 AM 05/23/2023    3:59 AM  CMP  Glucose 70 - 99 mg/dL  161  096   BUN 6 - 20 mg/dL  16  17   Creatinine 0.45 - 1.24 mg/dL 4.09  8.11  9.14   Sodium 135 - 145 mmol/L  136  135   Potassium 3.5 - 5.1 mmol/L  4.2  4.4   Chloride 98 - 111 mmol/L  106  102   CO2 22 - 32  mmol/L  24  29   Calcium 8.9 - 10.3 mg/dL  8.3  8.4   Total Protein 6.5 - 8.1 g/dL  6.6  6.9   Total Bilirubin 0.3 - 1.2 mg/dL  0.3  0.2   Alkaline Phos 38 - 126 U/L  94  100   AST 15 - 41 U/L  36  45   ALT 0 - 44 U/L  54  60      Scheduled Meds:  bictegravir-emtricitabine-tenofovir AF  1 tablet Oral Daily   doxycycline  100 mg Oral Q12H   enoxaparin (LOVENOX) injection  40 mg Subcutaneous Daily   gabapentin  300 mg Oral TID   ibuprofen  400 mg Oral QID   methocarbamol  500 mg Oral TID   pantoprazole  40 mg Oral Daily   sulfamethoxazole-trimethoprim  1 tablet Oral Daily   Continuous Infusions:    Time 20  Rhetta Mura, MD  [facility]

## 2023-05-26 ENCOUNTER — Other Ambulatory Visit (HOSPITAL_COMMUNITY): Payer: Self-pay

## 2023-05-26 DIAGNOSIS — L02419 Cutaneous abscess of limb, unspecified: Secondary | ICD-10-CM | POA: Diagnosis not present

## 2023-05-26 LAB — AEROBIC/ANAEROBIC CULTURE W GRAM STAIN (SURGICAL/DEEP WOUND)

## 2023-05-26 LAB — SURGICAL PATHOLOGY

## 2023-05-26 MED ORDER — METHOCARBAMOL 500 MG PO TABS
500.0000 mg | ORAL_TABLET | Freq: Three times a day (TID) | ORAL | 0 refills | Status: AC
  Filled 2023-05-26: qty 30, 10d supply, fill #0

## 2023-05-26 MED ORDER — SULFAMETHOXAZOLE-TRIMETHOPRIM 800-160 MG PO TABS
1.0000 | ORAL_TABLET | Freq: Every day | ORAL | 1 refills | Status: DC
Start: 2023-05-26 — End: 2023-09-17
  Filled 2023-05-26: qty 30, 30d supply, fill #0

## 2023-05-26 MED ORDER — PANTOPRAZOLE SODIUM 40 MG PO TBEC
40.0000 mg | DELAYED_RELEASE_TABLET | Freq: Every day | ORAL | 0 refills | Status: AC
  Filled 2023-05-26: qty 30, 30d supply, fill #0

## 2023-05-26 MED ORDER — DOXYCYCLINE HYCLATE 100 MG PO TABS
100.0000 mg | ORAL_TABLET | Freq: Two times a day (BID) | ORAL | 0 refills | Status: AC
  Filled 2023-05-26: qty 18, 9d supply, fill #0

## 2023-05-26 MED ORDER — OXYCODONE HCL 10 MG PO TABS
10.0000 mg | ORAL_TABLET | ORAL | 0 refills | Status: DC | PRN
  Filled 2023-05-26: qty 30, 5d supply, fill #0

## 2023-05-26 NOTE — Progress Notes (Signed)
Orthopedic Surgery Progress Note   Assessment: Patient is a 57 y.o. male with right knee prepatellar septic bursitis s/p I&D   Plan: -Operative plans: complete -Okay for diet and dvt ppx from ortho perspective -Non weight bearing right lower extremity, leave dressings on until seen in office -Drain removed this morning -Intra-operative cultures showing MRSA -Antibiotics: per ID -PT/OT evaluate and treat -Pain control -Okay for discharge from ortho perspective, will need follow up with Dr. Lajoyce Corners or myself in 10-14 days  ___________________________________________________________________________  Subjective: No acute events overnight. Pain well controlled. Was able to get some sleep last night. No complaints this morning.    Physical Exam:  General: no acute distress, appears stated age Neurologic: alert, answering questions appropriately, following commands Respiratory: unlabored breathing on room air, symmetric chest rise  MSK:   -Right lower extremity  Dressings c/d/I, drain with minimal serous output Flexes and extends knee BKA stump without any ulcers or open wounds Sensation intact to light touch over the anterior and posterior aspect of residual limb   Patient name: Curtis Hodge Patient MRN: [id number] Date: 05/26/23

## 2023-05-26 NOTE — Discharge Instructions (Signed)
Orthopedic Surgery Discharge Instructions  Patient name: Curtis Hodge Procedure Performed: right knee irrigation and debridement Date of Surgery: 05/21/2023 Surgeon: Willia Craze, MD  Pre-operative Diagnosis: right knee prepatellar septic bursitis Post-operative Diagnosis: same as above  Activity: You should remain non-weight bearing on the right lower extremity until the wound has healed enough to allow you to put your prosthesis back on. This will take several weeks and will be determined based on how the incision looks at your office visits.   Incision Care: Your incision site has a dressing over it. That dressing should remain in place and dry at all times until you are seen in the office. You can sponge bath but should not shower or get the wound wet until after being seen in the office. Do not put cream or lotion over the surgical area. Do not submerge (e.g., take a bath, swim, go in a hot tub, etc.) until six weeks after surgery. There may be some bloody drainage from the incision into the dressing after surgery. This is normal. You do not need to replace the dressing. Continue to leave it in place for the one week as instructed above. Should the dressing become saturated with blood or drainage, please call the office for further instructions.   You may resume any home blood thinners (warfarin, lovenox, apixaban, plavix, xarelto, etc) after your surgery. Take these medications as they were previously prescribed.  Diet: You are safe to resume your regular diet after surgery.   Reasons to Call the Office After Surgery: You should feel free to call the office with any concerns or questions you have in the post-operative period, but you should definitely notify the office if you develop: -shortness of breath, chest pain, or trouble breathing -excessive bleeding, drainage, redness, or swelling around the surgical site -fevers, chills, or pain that is getting worse with each passing  day -persistent nausea or vomiting -other concerns about your surgery  Follow Up Appointments: You should call the office to schedule an appointment with Dr. Lajoyce Corners or Dr. Christell Constant for 10-14 days after your discharge from the hospital.   Office Information:  -Phone number: [phone] -Address: [address]       [place], [place] [address]

## 2023-05-26 NOTE — Discharge Summary (Signed)
Physician Discharge Summary  Curtis Hodge ZOX:[id number] DOB: [birth year] DOA: 05/15/2023  PCP: Hoy Register, MD  Admit date: 05/15/2023 Discharge date: 05/26/2023  Time spent: 44 minutes  Recommendations for Outpatient Follow-up:  Requires completion of doxycycline for MRSA growing in wound CC Dr. Simeon Craft. Christell Constant with regards to right lower extremity follow-up in about 10 to 14 days at their office Requires Chem-12 CBC in about 5 to 7 days at PCP or [facility]-should continue use of Bactrim but need to have labs monitored Recommend outpatient viral load in about 2 months Suggest outpatient referral to psychiatry [facility] to discuss management of hepatitis B--- I am not sure if patient has been treated for this--"(+)chronic hepatitis b   Hep C Ab (-) 06/2018 --> needs yearly screenings with active intranasal drug use-"---Per office visit 04/15/2019 with [name]  Discharge Diagnoses:  MAIN problem for hospitalization   Septic right knee status post irrigation debridement HIV with CD4 count elevated during hospital stay indicative of nonadherence Untreated hepatitis B Thrombocytopenia multifactorial Resolved diarrhea Polysubstance abuse with THC and cocaine  Please see below for itemized issues addressed in HOpsital- refer to other progress notes for clarity if needed  Discharge Condition: improved  Diet recommendation: regular  Filed Weights   05/15/23 1552 05/15/23 1702  Weight: 77.1 kg 77.1 kg    History of present illness:  57 year old black male 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  Treated hepatitis B Normocytic anemia Schizophrenia/MDD Normal EF on echo in 2023 Homelessness documented with the past   6/27 admitted with fever from the emergency  room right leg pain cellulitis and inability to stand with large amounts of diarrhea additionally Stump warm tender and erythematous with good ROM-Tmax 102.2 tachycardic 100-had a transaminitis thrombocytopenia 101--vancomycin and cefepime Flagyl started 6/28 ID consulted, orthopedics consulted--- blood cultures failed to grow any organism--- ultimately developed purulence from the area on 7/2 7/3 underwent right knee irrigation debridement to the level of bone 7/6 abx narrow to doxy  Hospital Course:   Septic right knee joint status post procedure irrigation debridement to bone 7/3 BCx 2 6/27 negative--deep wound cultures aspirate 7/3 cult neg---vancomycin and ceftriaxone-->doxy for 3 weeks total ending 7/23 [day 1 being 7/3]-discussed with Dr. Algis Liming Wound drain was removed 7/8 wound looks stable and Dr. Christell Constant recommended nonweightbearing leave dressings on the stump until can be reevaluated by either Dr. Christell Constant or Dr. Lajoyce Corners in the outpatient Pain regimen explained to patient in detail at the bedside as per orders below Ordering electric WC-await delivery-TOC aware   HIV CD4 count 177/(CD4 percent 15), HIV-1 RNA quant 21,000 Consistent with noncompliance on Biktarvy [has a bottle of this at bedside]-appreciate ID expertise-continue Bactrim for PJP prophylaxis-I have explained to the patient clearly that he will be on not 1 but 2 antibiotics and he understands this He understands that the Bactrim should be continued at all times   Thrombocytopenia related to above combination of events   Resolved diarrhea No further workup at this time monitor   Polysubstance abuse THC cocaine counseled to stop smoking stop cocaine-will be discharging to an address He understands to stop   Schizophrenia depression ?outpatient psych follow-up I did speak to him and challenged him with regards to finding something to do as an outpatient he tells me that he is on disability because of  his emphysema-I still  think that he can find something to do given his high levels of energy etc.   Hyperlipidemia Stop statin until OP re-eval   Discharge Exam: Vitals:   05/25/23 2113 05/26/23 0621  BP: (!) 134/119 139/83  Pulse: 95 78  Resp: 18 17  Temp: 98.4 F (36.9 C) 98.4 F (36.9 C)  SpO2: 99% 100%    Subj on day of d/c   Awake coherent no distress animated excited to be leaving Chest clear no added sound no wheeze rales rhonchi S1-S2 no murmur Chest is clear Abdomen is soft no rebound No lower extremity edema stump seems wrapped drain is removed   Discharge Instructions   Discharge Instructions     Ambulatory referral to Physical Therapy   Complete by: As directed    Diet - low sodium heart healthy   Complete by: As directed    Discharge instructions   Complete by: As directed    Please make sure you take the pain medications as we discussed-Tylenol first choice Naprosyn second choice for breakthrough pain use the oxycodone You can continue your regular gabapentin and if you have spasm use the Robaxin We have attempted to get you a motorized wheelchair-please make sure that you get the case manager at the number of your roommate so that they can deliver it for you and help you and please get the ramp set up As we discussed you will be on 2 antibiotics-the doxycycline is the one for your knee to treat the infection The Bactrim should be taken daily with your Biktarvy which you have a supply of to ensure that you keep your HIV well-controlled Please follow-up with Dr. Christell Constant do not bear weight on this area and best of luck   Discharge wound care:   Complete by: As directed    Keep wound dry and follow with Dr. Maryjean Ka Keep taking Biktarvy/Bactrim daily Finish the course of Doxycycline dosing for the underlying infection Use the medications as per MAr for pain as we have gone over  Get labs in 1-2 weeks at Primary office--make sure that you are also seen by Dr. Zenaida Niece dam as well    Increase activity slowly   Complete by: As directed    Increase activity slowly   Complete by: As directed    No wound care   Complete by: As directed       Allergies as of 05/26/2023       Reactions   Bee Pollen    bees   Beet Root [germanium]    beets        Medication List     STOP taking these medications    cephALEXin 500 MG capsule Commonly known as: KEFLEX   oxyCODONE-acetaminophen 5-325 MG tablet Commonly known as: PERCOCET/ROXICET       TAKE these medications    acetaminophen 500 MG tablet Commonly known as: TYLENOL Take 500 mg by mouth every 6 (six) hours as needed for mild pain or moderate pain.   atorvastatin 40 MG tablet Commonly known as: LIPITOR Take 1 tablet (40 mg total) by mouth daily.   Biktarvy 50-200-25 MG Tabs tablet Generic drug: bictegravir-emtricitabine-tenofovir AF Take 1 tablet by mouth daily.   doxycycline 100 MG tablet Commonly known as: VIBRA-TABS Take 1 tablet (100 mg total) by mouth every 12 (twelve) hours for 18 doses.   gabapentin 300 MG capsule Commonly known as: NEURONTIN Take 1 capsule (300 mg total) by mouth 3 (three) times daily.  methocarbamol 500 MG tablet Commonly known as: ROBAXIN Take 1 tablet (500 mg total) by mouth 3 (three) times daily.   naproxen sodium 220 MG tablet Commonly known as: ALEVE Take 440 mg by mouth as needed (As needed).   Oxycodone HCl 10 MG Tabs Take 1 tablet (10 mg total) by mouth every 4 (four) hours as needed for severe pain.   pantoprazole 40 MG tablet Commonly known as: PROTONIX Take 1 tablet (40 mg total) by mouth daily.   sulfamethoxazole-trimethoprim 800-160 MG tablet Commonly known as: Bactrim DS Take 1 tablet by mouth daily.   Ventolin HFA 108 (90 Base) MCG/ACT inhaler Generic drug: albuterol Inhale 2 puffs into the lungs every 6 (six) hours as needed for wheezing or shortness of breath.               Durable Medical Equipment  (From admission, onward)            Start     Ordered   05/23/23 1510  For home use only DME Wheelchair electric  Once        05/23/23 1509              Discharge Care Instructions  (From admission, onward)           Start     Ordered   05/26/23 0000  Discharge wound care:       Comments: Keep wound dry and follow with Dr. Maryjean Ka Keep taking Biktarvy/Bactrim daily Finish the course of Doxycycline dosing for the underlying infection Use the medications as per MAr for pain as we have gone over  Get labs in 1-2 weeks at Primary office--make sure that you are also seen by Dr. Zenaida Niece dam as well   05/26/23 4098           Allergies  Allergen Reactions   Bee Pollen     bees   Beet Root [Germanium]     beets      The results of significant diagnostics from this hospitalization (including imaging, microbiology, ancillary and laboratory) are listed below for reference.    Significant Diagnostic Studies: MR KNEE RIGHT W WO CONTRAST  Result Date: 05/19/2023 CLINICAL DATA:  Cellulitis at right BKA stump. Insect bite with swelling and wound superolateral to the patella. Septic arthritis suspected. EXAM: MRI OF THE RIGHT KNEE WITHOUT AND WITH CONTRAST TECHNIQUE: Multiplanar, multisequence MR imaging of the right knee was performed both before and after administration of intravenous contrast. CONTRAST:  7.19mL GADAVIST GADOBUTROL 1 MMOL/ML IV SOLN COMPARISON:  Radiographs and CT 05/15/2023 FINDINGS: Despite efforts by the technologist and patient, mild motion artifact is present on today's exam and could not be eliminated. This reduces exam sensitivity and specificity. Of note, this examination was performed as an examination of the knee, and does not include the distal and of the tibial stump. MENISCI Medial meniscus:  Intact with normal morphology. Lateral meniscus:  Intact with normal morphology. LIGAMENTS Cruciates: The anterior and posterior cruciate ligaments are intact. Collaterals: The medial and  lateral collateral ligament complexes are intact. CARTILAGE Patellofemoral:  Preserved. Medial:  Mild chondral thinning without focal defect. Lateral:  Mild chondral thinning without focal defect. MISCELLANEOUS Joint: Small knee joint effusion demonstrates mild nonspecific smooth synovial enhancement. This effusion has mildly enlarged from the recent prior CT. Popliteal Fossa: The popliteus muscle and tendon are intact. No significant Baker's cyst. Extensor Mechanism: The visualized quadriceps and patellar tendons are intact. Bones: The bones appear mildly demineralized. No evidence of  acute fracture, dislocation or osteonecrosis. Probable mild reactive edema within the patella. There are no signs of osteomyelitis within the visualized bones around the knee. As above, the distal ends of the tibial and fibular stumps are not visualized status post below the knee amputation. Other: There is a large, complex subcutaneous fluid collection with peripheral enhancement anterolaterally, lying superficial to the extensor mechanism and lateral patellar retinaculum. This measures up to 8.4 cm in length and 6.7 x 1.8 cm transverse, suspicious for a superficial abscess. Diffuse surrounding subcutaneous edema and enhancement have also mildly progressed, consistent with worsening cellulitis. No other focal fluid collections are identified. IMPRESSION: 1. Large, complex subcutaneous fluid collection anterolaterally, suspicious for a superficial abscess. Aspiration or drainage of this collection recommended. Surrounding cellulitis has also progressed. 2. Small knee joint effusion with mild nonspecific synovial enhancement, potentially reactive. No findings highly suspicious for septic arthritis or osteomyelitis. Of note, placing an intra-articular needle through the inflamed anterior soft tissues may introduce infection into the joint. 3. The menisci, cruciate and collateral ligaments are intact. 4. The distal ends of the tibial and  fibular stumps are not visualized status post below the knee amputation. Electronically Signed   By: Carey Bullocks M.D.   On: 05/19/2023 15:11   CT KNEE RIGHT W CONTRAST  Result Date: 05/15/2023 CLINICAL DATA:  Cellulitis at right BKA stump, insect bite, swelling EXAM: CT OF THE RIGHT KNEE WITH CONTRAST TECHNIQUE: Multidetector CT imaging was performed following the standard protocol during bolus administration of intravenous contrast. RADIATION DOSE REDUCTION: This exam was performed according to the departmental dose-optimization program which includes automated exposure control, adjustment of the mA and/or kV according to patient size and/or use of iterative reconstruction technique. CONTRAST:  OMNIPAQUE IOHEXOL 300 MG/ML  SOLN COMPARISON:  05/15/2023 FINDINGS: Bones/Joint/Cartilage The bones are diffusely osteopenic. There are no areas of bony destruction or periosteal reaction to suggest osteomyelitis. Alignment of the right knee is anatomic. No right knee effusion. Ligaments Suboptimally assessed by CT. Muscles and Tendons No acute muscular abnormalities. No intramuscular fluid collection or abnormal enhancement. Soft tissues There is diffuse subcutaneous edema consistent with given history of cellulitis. No evidence of subcutaneous gas or fluid collection. No evidence of abscess. There is diffuse atherosclerosis. Reconstructed images demonstrate no additional findings. IMPRESSION: 1. Diffuse subcutaneous edema consistent with cellulitis. No fluid collection or abscess. 2. Diffuse osteopenia. No focal bony destruction or periosteal reaction to suggest osteomyelitis. Electronically Signed   By: Sharlet Salina M.D.   On: 05/15/2023 21:55   DG Knee Complete 4 Views Right  Result Date: 05/15/2023 CLINICAL DATA:  Right stump cellulitis. Patient reports he was bit by some sort of insect 4 days ago. Currently on antibiotics. Soft tissue is hot below the bite and warmth above it. Stump swelling.  History of stump infections. EXAM: RIGHT KNEE - COMPLETE 4+ VIEW COMPARISON:  Right tibia and fibula radiographs 12/16/2022 FINDINGS: Postsurgical changes are again seen of amputation to the level of the proximal tibial and fibular diaphyses. Interval removal of the prior distal surgical skin staples. No definitive cortical erosion is seen, within limitation of distal postsurgical change. No subcutaneous air is seen. No acute fracture or dislocation. Mild chronic enthesopathic change at the quadriceps insertion on the patella. IMPRESSION: Postsurgical changes of amputation to the level of the proximal tibial and fibular diaphyses. The distal bone amputation sites appear fairly sharp, and no definitive radiographic evidence of osteomyelitis is seen. Electronically Signed   By: Kerin Salen.D.  On: 05/15/2023 18:15   DG Chest Port 1 View  Result Date: 05/15/2023 CLINICAL DATA:  Questionable sepsis EXAM: PORTABLE CHEST 1 VIEW COMPARISON:  11/21/2022 FINDINGS: Cardiac and mediastinal contours are within normal limits. No focal pulmonary opacity. No pleural effusion or pneumothorax. No acute osseous abnormality. IMPRESSION: No acute cardiopulmonary process. Electronically Signed   By: Wiliam Ke M.D.   On: 05/15/2023 17:07    Microbiology: Recent Results (from the past 240 hour(s))  MRSA Next Gen by PCR, Nasal     Status: Abnormal   Collection Time: 05/17/23 11:39 AM   Specimen: Nasal Mucosa; Nasal Swab  Result Value Ref Range Status   MRSA by PCR Next Gen DETECTED (A) NOT DETECTED Final    Comment: (NOTE) The GeneXpert MRSA Assay (FDA approved for NASAL specimens only), is one component of a comprehensive MRSA colonization surveillance program. It is not intended to diagnose MRSA infection nor to guide or monitor treatment for MRSA infections. Test performance is not FDA approved in patients less than 44 years old. Performed at [facility], [address] [place],  [place] [address]   Aerobic/Anaerobic Culture w Gram Stain (surgical/deep wound)     Status: None (Preliminary result)   Collection Time: 05/21/23  6:03 PM   Specimen: Path Tissue  Result Value Ref Range Status   Specimen Description   Final    TISSUE Performed at [facility], [address] [place], [place] [address]    Special Requests   Final    R KNEE Performed at [facility], [address], [place], [place] [address]    Gram Stain   Final    RARE WBC PRESENT,BOTH PMN AND MONONUCLEAR NO ORGANISMS SEEN Performed at [facility], [address], [place], [place] [address]    Culture   Final    RARE METHICILLIN RESISTANT STAPHYLOCOCCUS AUREUS NO ANAEROBES ISOLATED; CULTURE IN PROGRESS FOR 5 DAYS    Report Status PENDING  Incomplete   Organism ID, Bacteria METHICILLIN RESISTANT STAPHYLOCOCCUS AUREUS  Final      Susceptibility   Methicillin resistant staphylococcus aureus - MIC*    CIPROFLOXACIN >=8 RESISTANT Resistant     ERYTHROMYCIN >=8 RESISTANT Resistant     GENTAMICIN <=0.5 SENSITIVE Sensitive     OXACILLIN >=4 RESISTANT Resistant     TETRACYCLINE <=1 SENSITIVE Sensitive     VANCOMYCIN 1 SENSITIVE Sensitive     TRIMETH/SULFA >=320 RESISTANT Resistant     CLINDAMYCIN <=0.25 SENSITIVE Sensitive     RIFAMPIN <=0.5 SENSITIVE Sensitive     Inducible Clindamycin NEGATIVE Sensitive     LINEZOLID 2 SENSITIVE Sensitive     * RARE METHICILLIN RESISTANT STAPHYLOCOCCUS AUREUS  Acid Fast Smear (AFB)     Status: None   Collection Time: 05/21/23  6:03 PM   Specimen: Path Tissue  Result Value Ref Range Status   AFB Specimen Processing Comment  Final    Comment: Tissue Grinding and Digestion/Decontamination   Acid Fast Smear Negative  Final    Comment: (NOTE) Performed At: [facility] [address] [place], [place] [id number] Jolene Schimke MD MV:[id number]    Source (AFB) TISSUE  Final    Comment: R KNEE Performed at [facility], [address], [place], [place] [address]   Aerobic/Anaerobic Culture w Gram Stain (surgical/deep wound)     Status: None (Preliminary result)   Collection Time: 05/21/23  6:12 PM   Specimen: Path Tissue  Result  Value Ref Range Status   Specimen Description   Final    TISSUE Performed at [facility], [address], [place], [place] [address]    Special Requests   Final    R KNEE Performed at [facility], [address], [place], [place] [address]    Gram Stain   Final    RARE WBC PRESENT,BOTH PMN AND MONONUCLEAR NO ORGANISMS SEEN Performed at [facility], [address] [place], [place] [address]    Culture   Final    RARE STAPHYLOCOCCUS AUREUS SUSCEPTIBILITIES PERFORMED ON PREVIOUS CULTURE WITHIN THE LAST 5 DAYS. NO ANAEROBES ISOLATED; CULTURE IN PROGRESS FOR 5 DAYS    Report Status PENDING  Incomplete  Aerobic/Anaerobic Culture w Gram Stain (surgical/deep wound)     Status: None (Preliminary result)   Collection Time: 05/21/23  6:12 PM   Specimen: Path Tissue  Result Value Ref Range Status   Specimen Description   Final    TISSUE Performed at [facility], [address], [place], [place] [address]    Special Requests   Final    R KNEE 3 Performed at [facility], [address], [place], [place] [address]    Gram Stain   Final    FEW WBC PRESENT,BOTH PMN AND MONONUCLEAR RARE GRAM POSITIVE COCCI IN PAIRS IN SINGLES Performed at [facility], [address] [place], [place] [address]    Culture   Final    RARE STAPHYLOCOCCUS AUREUS SUSCEPTIBILITIES PERFORMED ON PREVIOUS CULTURE WITHIN THE LAST 5 DAYS. NO ANAEROBES ISOLATED; CULTURE IN PROGRESS FOR 5 DAYS    Report Status PENDING  Incomplete  Acid Fast Smear (AFB)     Status: None   Collection Time: 05/21/23  6:12 PM   Specimen: Path Tissue  Result Value Ref Range Status   AFB Specimen Processing  Comment  Final    Comment: Tissue Grinding and Digestion/Decontamination   Acid Fast Smear Negative  Final    Comment: (NOTE) Performed At: [facility] [address] [place], [place] [id number] Jolene Schimke MD BM:[id number]    Source (AFB) TISSUE  Final    Comment: R KNEE Performed at [facility], [address], [place], [place] [address]   Acid Fast Smear (AFB)     Status: None   Collection Time: 05/21/23  6:12 PM   Specimen: Path Tissue  Result Value Ref Range Status   AFB Specimen Processing Comment  Final    Comment: Tissue Grinding and Digestion/Decontamination   Acid Fast Smear Negative  Final    Comment: (NOTE) Performed At: [facility] [address] [place], [place] [id number] Jolene Schimke MD QV:[id number]    Source (AFB) TISSUE  Final    Comment: R KNEE 3 Performed at [facility], [address], [place], [place] [address]      Labs: Basic Metabolic Panel: Recent Labs  Lab 05/20/23 0437 05/22/23 0356 05/23/23 0359 05/24/23 0407 05/25/23 0341  NA 130* 131* 135 136  --   K 4.1 4.6 4.4 4.2  --   CL 100 100 102 106  --   CO2 26 22 29 24   --   GLUCOSE 107* 154* 130* 131*  --   BUN 11 15 17 16   --   CREATININE 0.96 0.98 1.02 1.03 1.04  CALCIUM 7.8* 8.4* 8.4* 8.3*  --   MG 1.9  --   --   --   --  PHOS 2.6  --   --   --   --    Liver Function Tests: Recent Labs  Lab 05/20/23 0437 05/22/23 0356 05/23/23 0359 05/24/23 0407  AST 70* 65* 45* 36  ALT 65* 70* 60* 54*  ALKPHOS 110 111 100 94  BILITOT 0.2* 0.4 0.2* 0.3  PROT 7.1 7.7 6.9 6.6  ALBUMIN 2.4* 2.6* 2.4* 2.2*   No results for input(s): "LIPASE", "AMYLASE" in the last 168 hours. No results for input(s): "AMMONIA" in the last 168 hours. CBC: Recent Labs  Lab 05/20/23 0437 05/22/23 0356 05/23/23 0359 05/24/23 0407  WBC 4.9 5.0 8.2 4.9  NEUTROABS 3.3  --   --   --   HGB 11.5* 10.8* 10.5* 9.9*  HCT 34.7* 32.9* 32.7* 31.0*  MCV  96.4 97.3 97.9 100.0  PLT 141* 187 170 157   Cardiac Enzymes: No results for input(s): "CKTOTAL", "CKMB", "CKMBINDEX", "TROPONINI" in the last 168 hours. BNP: BNP (last 3 results) Recent Labs    11/02/22 0956  BNP 128.0*    ProBNP (last 3 results) No results for input(s): "PROBNP" in the last 8760 hours.  CBG: No results for input(s): "GLUCAP" in the last 168 hours.     Signed:  Rhetta Mura MD   [facility] 05/26/2023, 9:44 AM

## 2023-05-27 ENCOUNTER — Telehealth: Payer: Self-pay

## 2023-05-27 LAB — FUNGUS CULTURE RESULT

## 2023-05-27 LAB — FUNGUS CULTURE WITH STAIN

## 2023-05-27 NOTE — Transitions of Care (Post Inpatient/ED Visit) (Signed)
   05/27/2023  Name: Curtis Hodge MRN: [id number] DOB: 1966-01-17  Today's TOC FU Call Status: Today's TOC FU Call Status:: Unsuccessul Call (1st Attempt) Unsuccessful Call (1st Attempt) Date: 05/27/23 (A male answered and reported that he was not with patient at present-advised to call back in an hr or so.)  Attempted to reach the patient regarding the most recent Inpatient/ED visit.  Follow Up Plan: Additional outreach attempts will be made to reach the patient to complete the Transitions of Care (Post Inpatient/ED visit) call.     Antionette Fairy, RN,BSN,CCM [facility] Care Management Care Management Community Coordinator Direct Phone: [phone] Toll Free: [phone] Fax: [phone]

## 2023-05-27 NOTE — Transitions of Care (Post Inpatient/ED Visit) (Signed)
   05/27/2023  Name: Curtis Hodge MRN: [id number] DOB: 1966-10-20  Today's TOC FU Call Status: Today's TOC FU Call Status:: Unsuccessful Call (2nd Attempt) Unsuccessful Call (2nd Attempt) Date: 05/27/23  Attempted to reach the patient regarding the most recent Inpatient/ED visit.  Follow Up Plan: Additional outreach attempts will be made to reach the patient to complete the Transitions of Care (Post Inpatient/ED visit) call.     Antionette Fairy, RN,BSN,CCM [facility] Care Management Care Management Community Coordinator Direct Phone: [phone] Toll Free: [phone] Fax: [phone]

## 2023-05-28 ENCOUNTER — Telehealth: Payer: Self-pay

## 2023-05-28 NOTE — Transitions of Care (Post Inpatient/ED Visit) (Signed)
   05/28/2023  Name: Curtis Hodge MRN: [id number] DOB: 1966-08-05  Today's TOC FU Call Status: Today's TOC FU Call Status:: Unsuccessful Call (3rd Attempt) Unsuccessful Call (3rd Attempt) Date: 05/28/23  Attempted to reach the patient regarding the most recent Inpatient/ED visit.  Follow Up Plan: No further outreach attempts will be made at this time. We have been unable to contact the patient.     Antionette Fairy, RN,BSN,CCM [facility] Care Management Care Management Community Coordinator Direct Phone: [phone] Toll Free: [phone] Fax: [phone]

## 2023-05-30 ENCOUNTER — Encounter: Payer: Self-pay | Admitting: Physical Medicine & Rehabilitation

## 2023-06-03 ENCOUNTER — Other Ambulatory Visit: Payer: Self-pay

## 2023-06-03 ENCOUNTER — Other Ambulatory Visit (HOSPITAL_COMMUNITY): Payer: Self-pay

## 2023-06-03 ENCOUNTER — Telehealth: Payer: Self-pay | Admitting: Orthopedic Surgery

## 2023-06-03 ENCOUNTER — Other Ambulatory Visit: Payer: Self-pay | Admitting: Family

## 2023-06-03 NOTE — Telephone Encounter (Signed)
Pt is s/p I&D knee with Dr. Christell Constant on 05/21/2023. Was d/c from hospital 05/26/2023 does not have follow up appt sch he did receive Oxycodone 10 mg #30 that day. Will need to call and sch hospital follow up.

## 2023-06-03 NOTE — Telephone Encounter (Signed)
Patient called. Would like a refill on his pain medication. But does not have a call back number. The number that came up is [phone]

## 2023-06-03 NOTE — Telephone Encounter (Signed)
I called and sw pt. Advised that we need to see him in the office. Appt sch with Erin tomorrow at 10:15.

## 2023-06-04 ENCOUNTER — Encounter: Payer: Self-pay | Admitting: Family

## 2023-06-04 ENCOUNTER — Other Ambulatory Visit: Payer: Self-pay

## 2023-06-04 ENCOUNTER — Ambulatory Visit (INDEPENDENT_AMBULATORY_CARE_PROVIDER_SITE_OTHER): Payer: 59 | Admitting: Family

## 2023-06-04 DIAGNOSIS — A419 Sepsis, unspecified organism: Secondary | ICD-10-CM

## 2023-06-04 DIAGNOSIS — L0390 Cellulitis, unspecified: Secondary | ICD-10-CM

## 2023-06-04 MED ORDER — OXYCODONE HCL 10 MG PO TABS
10.0000 mg | ORAL_TABLET | Freq: Four times a day (QID) | ORAL | 0 refills | Status: DC | PRN
  Filled 2023-06-04: qty 30, 8d supply, fill #0
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
## 2023-06-13 ENCOUNTER — Encounter: Payer: Self-pay | Admitting: Family

## 2023-06-13 ENCOUNTER — Ambulatory Visit (INDEPENDENT_AMBULATORY_CARE_PROVIDER_SITE_OTHER): Payer: 59 | Admitting: Family

## 2023-06-13 DIAGNOSIS — Z89511 Acquired absence of right leg below knee: Secondary | ICD-10-CM

## 2023-06-13 DIAGNOSIS — L02419 Cutaneous abscess of limb, unspecified: Secondary | ICD-10-CM

## 2023-06-13 DIAGNOSIS — S88111D Complete traumatic amputation at level between knee and ankle, right lower leg, subsequent encounter: Secondary | ICD-10-CM
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
## 2023-06-23 ENCOUNTER — Inpatient Hospital Stay: Payer: 59 | Admitting: Infectious Disease

## 2023-06-27 ENCOUNTER — Ambulatory Visit: Payer: MEDICAID | Admitting: Physical Therapy

## 2023-07-03 ENCOUNTER — Ambulatory Visit: Payer: MEDICAID | Attending: Family Medicine

## 2023-07-10 ENCOUNTER — Encounter: Payer: MEDICAID | Attending: Physical Medicine & Rehabilitation | Admitting: Physical Medicine & Rehabilitation

## 2023-07-14 ENCOUNTER — Other Ambulatory Visit: Payer: Self-pay

## 2023-07-17 ENCOUNTER — Inpatient Hospital Stay: Payer: 59 | Admitting: Infectious Disease

## 2023-07-24 ENCOUNTER — Inpatient Hospital Stay: Payer: 59 | Admitting: Internal Medicine

## 2023-08-15 ENCOUNTER — Telehealth: Payer: Self-pay

## 2023-08-15 NOTE — Telephone Encounter (Signed)
Patient would like a Rx for pain.  RT knee surgery on 05/21/2023.  CB# [phone].  Please advise.  Thank you.

## 2023-08-18 ENCOUNTER — Encounter: Payer: Self-pay | Admitting: Orthopedic Surgery

## 2023-08-18 ENCOUNTER — Other Ambulatory Visit: Payer: Self-pay

## 2023-08-18 ENCOUNTER — Ambulatory Visit (INDEPENDENT_AMBULATORY_CARE_PROVIDER_SITE_OTHER): Payer: MEDICAID | Admitting: Orthopedic Surgery

## 2023-08-18 DIAGNOSIS — Z89511 Acquired absence of right leg below knee: Secondary | ICD-10-CM | POA: Diagnosis not present

## 2023-08-18 DIAGNOSIS — S88111S Complete traumatic amputation at level between knee and ankle, right lower leg, sequela: Secondary | ICD-10-CM

## 2023-08-18 MED ORDER — OXYCODONE-ACETAMINOPHEN 5-325 MG PO TABS
1.0000 | ORAL_TABLET | Freq: Three times a day (TID) | ORAL | 0 refills | Status: DC | PRN
  Filled 2023-08-18: qty 20, 7d supply, fill #0

## 2023-08-18 NOTE — Telephone Encounter (Signed)
Pt is coming in today at 9 am.
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
## 2023-09-07 ENCOUNTER — Emergency Department (HOSPITAL_COMMUNITY)
Admission: EM | Admit: 2023-09-07 | Discharge: 2023-09-07 | Disposition: A | Discharge status: Home / Self Care | Payer: MEDICAID | Attending: Emergency Medicine | Admitting: Emergency Medicine

## 2023-09-07 ENCOUNTER — Other Ambulatory Visit: Payer: Self-pay

## 2023-09-07 ENCOUNTER — Emergency Department (HOSPITAL_COMMUNITY): Payer: MEDICAID

## 2023-09-07 DIAGNOSIS — Z21 Asymptomatic human immunodeficiency virus [HIV] infection status: Secondary | ICD-10-CM | POA: Diagnosis not present

## 2023-09-07 DIAGNOSIS — G8928 Other chronic postprocedural pain: Secondary | ICD-10-CM | POA: Diagnosis not present

## 2023-09-07 DIAGNOSIS — M25561 Pain in right knee: Secondary | ICD-10-CM | POA: Insufficient documentation

## 2023-09-07 MED ORDER — OXYCODONE HCL 5 MG PO TABS
5.0000 mg | ORAL_TABLET | Freq: Four times a day (QID) | ORAL | 0 refills | Status: DC | PRN
  Filled 2023-09-07: qty 7, 2d supply, fill #0

## 2023-09-07 MED ORDER — OXYCODONE-ACETAMINOPHEN 5-325 MG PO TABS
1.0000 | ORAL_TABLET | Freq: Once | ORAL | Status: AC
  Administered 2023-09-07: 1 via ORAL
  Filled 2023-09-07: qty 1

## 2023-09-07 MED ORDER — OXYCODONE HCL 5 MG PO TABS: 5.0000 mg | ORAL_TABLET | Freq: Four times a day (QID) | ORAL | 0 refills | Status: DC | PRN

## 2023-09-07 MED ORDER — IBUPROFEN 600 MG PO TABS: 600.0000 mg | ORAL_TABLET | Freq: Four times a day (QID) | ORAL | 0 refills | Status: DC | PRN

## 2023-09-07 MED ORDER — IBUPROFEN 400 MG PO TABS
400.0000 mg | ORAL_TABLET | Freq: Once | ORAL | Status: AC
  Administered 2023-09-07: 400 mg via ORAL
  Filled 2023-09-07: qty 1

## 2023-09-07 NOTE — Discharge Instructions (Addendum)
You were seen in the emergency department for your knee pain.  Your x-ray showed no new abnormalities.  This is likely her chronic pain from your previous surgery.  Have given you a few more days of oxycodone.  You can take Tylenol and Motrin every 6 hours as needed for pain and oxycodone for breakthrough pain.  This can make you drowsy so do not take it before driving, working or operating heavy machinery.  You should follow-up with your orthopedic doctor to have your pain and symptoms rechecked for further pain management.  You should return to the emergency department if you have any new or concerning symptoms.

## 2023-09-07 NOTE — ED Provider Notes (Signed)
[place] EMERGENCY DEPARTMENT AT [facility] Provider Note   CSN: [id number] Arrival date & time: 09/07/23  0103     History  Chief Complaint  Patient presents with   Leg Pain    Curtis Hodge is a 57 y.o. male.  Patient is a 57 year old male with a past medical history of HIV and osteomyelitis status post right BKA presenting to the emergency department with right knee pain.  Patient states that he has been having a lot of pain since his surgery but the pain has been getting worse the last couple of days.  He states that his orthopedic doctors been trying to get him a new prosthesis that is more comfortable but has not yet come in.  He states that he is no longer on any pain medication and has not had anything to take and his pain is not controlled.  He denies any new trauma or falls.  He denies any drainage from his wound, or fevers.  The history is provided by the patient.  Leg Pain      Home Medications Prior to Admission medications   Medication Sig Start Date End Date Taking? Authorizing Provider  oxyCODONE (ROXICODONE) 5 MG immediate release tablet Take 1 tablet (5 mg total) by mouth every 6 (six) hours as needed. 09/07/23  Yes Elayne Snare K, DO  acetaminophen (TYLENOL) 500 MG tablet Take 500 mg by mouth every 6 (six) hours as needed for mild pain or moderate pain.    [provider]  albuterol (VENTOLIN HFA) 108 (90 Base) MCG/ACT inhaler Inhale 2 puffs into the lungs every 6 (six) hours as needed for wheezing or shortness of breath. 02/26/23   Anders Simmonds, PA-C  atorvastatin (LIPITOR) 40 MG tablet Take 1 tablet (40 mg total) by mouth daily. 02/26/23   Anders Simmonds, PA-C  bictegravir-emtricitabine-tenofovir AF (BIKTARVY) 50-200-25 MG TABS tablet Take 1 tablet by mouth daily. 02/26/23   Anders Simmonds, PA-C  gabapentin (NEURONTIN) 300 MG capsule Take 1 capsule (300 mg total) by mouth 3 (three) times daily. 04/18/23   Adonis Huguenin,  NP  methocarbamol (ROBAXIN) 500 MG tablet Take 1 tablet (500 mg total) by mouth 3 (three) times daily. 05/26/23   Rhetta Mura, MD  naproxen sodium (ALEVE) 220 MG tablet Take 440 mg by mouth as needed (As needed).    [provider]  oxyCODONE-acetaminophen (PERCOCET/ROXICET) 5-325 MG tablet Take 1 tablet by mouth every 8 (eight) hours as needed for severe pain. 08/18/23   Nadara Mustard, MD  pantoprazole (PROTONIX) 40 MG tablet Take 1 tablet (40 mg total) by mouth daily. 05/26/23   Rhetta Mura, MD  sulfamethoxazole-trimethoprim (BACTRIM DS) 800-160 MG tablet Take 1 tablet by mouth daily. 05/26/23   Rhetta Mura, MD      Allergies    Bee pollen and Beet root [germanium]    Review of Systems   Review of Systems  Physical Exam Updated Vital Signs BP (!) 160/112   Pulse 82   Temp 98.5 F (36.9 C) (Oral)   Resp 16   SpO2 97%  Physical Exam Vitals and nursing note reviewed.  Constitutional:      General: He is not in acute distress.    Appearance: Normal appearance.  HENT:     Head: Normocephalic.     Nose: Nose normal.     Mouth/Throat:     Mouth: Mucous membranes are moist.  Eyes:     Extraocular Movements: Extraocular movements  intact.  Cardiovascular:     Rate and Rhythm: Normal rate.  Pulmonary:     Effort: Pulmonary effort is normal.  Abdominal:     General: Abdomen is flat.  Musculoskeletal:        General: Normal range of motion.     Cervical back: Normal range of motion.     Comments: RLE BKA, knobby appearance to R knee with generalized tenderness to palpation, no palpable effusion, no erythema or warmth, no edema, scar is well healed  Skin:    General: Skin is warm and dry.  Neurological:     General: No focal deficit present.     Mental Status: He is alert and oriented to person, place, and time.  Psychiatric:        Mood and Affect: Mood normal.        Behavior: Behavior normal.     ED Results / Procedures / Treatments    Labs (all labs ordered are listed, but only abnormal results are displayed) Labs Reviewed - No data to display  EKG None  Radiology DG Knee Complete 4 Views Right  Result Date: 09/07/2023 CLINICAL DATA:  Right knee pain.  No known injury. EXAM: RIGHT KNEE - COMPLETE 4+ VIEW COMPARISON:  05/15/2023. FINDINGS: There is disuse osteopenia of the visualized osseous structures. Redemonstration of below-knee amputation. The resection margins are sharp. No focal bone erosions. No overlying soft tissue defect or air within the soft tissue. No acute fracture or dislocation. No aggressive osseous lesion. Mild degenerative changes of the knee joint. No knee effusion or focal soft tissue swelling. No radiopaque foreign bodies. IMPRESSION: 1. No acute fracture or dislocation. 2. Disuse osteopenia.  Below-knee amputation. Electronically Signed   By: Jules Schick M.D.   On: 09/07/2023 09:06    Procedures Procedures    Medications Ordered in ED Medications  oxyCODONE-acetaminophen (PERCOCET/ROXICET) 5-325 MG per tablet 1 tablet (1 tablet Oral Given 09/07/23 0901)  ibuprofen (ADVIL) tablet 400 mg (400 mg Oral Given 09/07/23 0901)    ED Course/ Medical Decision Making/ A&P Clinical Course as of 09/07/23 1011  Sun Sep 07, 2023  0951 No acute abnormality on XR. Suspect this is chronic pain. Patient will be given a short course of pain control and will be recommended to follow up with orthopedics. [VK]    Clinical Course User Index [VK] Rexford Maus, DO                                 Medical Decision Making This patient presents to the ED with chief complaint(s) of R knee pain with pertinent past medical history of HIV, osteomyelitis s/p R BKA which further complicates the presenting complaint. The complaint involves an extensive differential diagnosis and also carries with it a high risk of complications and morbidity.    The differential diagnosis includes chronic pain, fracture or  dislocation, considering infection but no signs or symptoms of infection on exam or history  Additional history obtained: Additional history obtained from N/A Records reviewed outpatient orthopedic records  ED Course and Reassessment: On patient's arrival he is hemodynamically stable in no acute distress.  His BKA appears to be well-healed without any signs of active infection, no palpable knee joint effusion but is diffusely tender to palpation.  Will have x-ray to evaluate for bony abnormality.  Suspect this is likely his chronic pain and will be given Tylenol and Percocet for pain will  be closely reassessed.  Independent labs interpretation:  N/A  Independent visualization of imaging: - I independently visualized the following imaging with scope of interpretation limited to determining acute life threatening conditions related to emergency care: R knee XR, which revealed no acute abnormality  Consultation: - Consulted or discussed management/test interpretation w/ external professional: N/A  Consideration for admission or further workup: Patient has no emergent conditions requiring admission or further work-up at this time and is stable for discharge home with orthopedics follow-up  Social Determinants of health: N/A    Amount and/or Complexity of Data Reviewed Radiology: ordered.  Risk Prescription drug management.          Final Clinical Impression(s) / ED Diagnoses Final diagnoses:  Chronic post-operative pain    Rx / DC Orders ED Discharge Orders          Ordered    oxyCODONE (ROXICODONE) 5 MG immediate release tablet  Every 6 hours PRN        09/07/23 1009              [place], [place] K, DO 09/07/23 1011

## 2023-09-07 NOTE — ED Triage Notes (Signed)
Patient reports right leg pain for 3 days , denies injury /ambulatory.

## 2023-09-08 ENCOUNTER — Other Ambulatory Visit (HOSPITAL_COMMUNITY): Payer: Self-pay

## 2023-09-17 ENCOUNTER — Inpatient Hospital Stay (HOSPITAL_COMMUNITY)
Admission: EM | Admit: 2023-09-17 | Discharge: 2023-09-22 | DRG: 975 | Disposition: A | Discharge status: Home / Self Care | Payer: MEDICAID | Attending: Family Medicine | Admitting: Family Medicine

## 2023-09-17 ENCOUNTER — Inpatient Hospital Stay (HOSPITAL_COMMUNITY): Payer: MEDICAID

## 2023-09-17 ENCOUNTER — Other Ambulatory Visit: Payer: Self-pay

## 2023-09-17 ENCOUNTER — Encounter (HOSPITAL_COMMUNITY): Payer: Self-pay

## 2023-09-17 ENCOUNTER — Emergency Department (HOSPITAL_COMMUNITY): Payer: MEDICAID

## 2023-09-17 DIAGNOSIS — L0390 Cellulitis, unspecified: Secondary | ICD-10-CM | POA: Diagnosis present

## 2023-09-17 DIAGNOSIS — Z66 Do not resuscitate: Secondary | ICD-10-CM | POA: Diagnosis present

## 2023-09-17 DIAGNOSIS — Z8619 Personal history of other infectious and parasitic diseases: Secondary | ICD-10-CM

## 2023-09-17 DIAGNOSIS — B59 Pneumocystosis: Secondary | ICD-10-CM | POA: Diagnosis not present

## 2023-09-17 DIAGNOSIS — K7460 Unspecified cirrhosis of liver: Secondary | ICD-10-CM | POA: Diagnosis present

## 2023-09-17 DIAGNOSIS — E785 Hyperlipidemia, unspecified: Secondary | ICD-10-CM | POA: Diagnosis present

## 2023-09-17 DIAGNOSIS — L03115 Cellulitis of right lower limb: Secondary | ICD-10-CM

## 2023-09-17 DIAGNOSIS — R197 Diarrhea, unspecified: Secondary | ICD-10-CM | POA: Diagnosis not present

## 2023-09-17 DIAGNOSIS — M869 Osteomyelitis, unspecified: Secondary | ICD-10-CM | POA: Diagnosis present

## 2023-09-17 DIAGNOSIS — M62838 Other muscle spasm: Secondary | ICD-10-CM | POA: Diagnosis present

## 2023-09-17 DIAGNOSIS — Y835 Amputation of limb(s) as the cause of abnormal reaction of the patient, or of later complication, without mention of misadventure at the time of the procedure: Secondary | ICD-10-CM | POA: Diagnosis present

## 2023-09-17 DIAGNOSIS — F17210 Nicotine dependence, cigarettes, uncomplicated: Secondary | ICD-10-CM | POA: Diagnosis present

## 2023-09-17 DIAGNOSIS — F209 Schizophrenia, unspecified: Secondary | ICD-10-CM | POA: Diagnosis present

## 2023-09-17 DIAGNOSIS — Z89511 Acquired absence of right leg below knee: Secondary | ICD-10-CM

## 2023-09-17 DIAGNOSIS — S88111A Complete traumatic amputation at level between knee and ankle, right lower leg, initial encounter: Secondary | ICD-10-CM

## 2023-09-17 DIAGNOSIS — D696 Thrombocytopenia, unspecified: Secondary | ICD-10-CM | POA: Diagnosis present

## 2023-09-17 DIAGNOSIS — T8743 Infection of amputation stump, right lower extremity: Secondary | ICD-10-CM | POA: Diagnosis present

## 2023-09-17 DIAGNOSIS — Z91148 Patient's other noncompliance with medication regimen for other reason: Secondary | ICD-10-CM

## 2023-09-17 DIAGNOSIS — Z23 Encounter for immunization: Secondary | ICD-10-CM | POA: Diagnosis not present

## 2023-09-17 DIAGNOSIS — J189 Pneumonia, unspecified organism: Principal | ICD-10-CM

## 2023-09-17 DIAGNOSIS — M1990 Unspecified osteoarthritis, unspecified site: Secondary | ICD-10-CM | POA: Diagnosis present

## 2023-09-17 DIAGNOSIS — F1410 Cocaine abuse, uncomplicated: Secondary | ICD-10-CM | POA: Diagnosis present

## 2023-09-17 DIAGNOSIS — Z79899 Other long term (current) drug therapy: Secondary | ICD-10-CM | POA: Diagnosis not present

## 2023-09-17 DIAGNOSIS — B181 Chronic viral hepatitis B without delta-agent: Secondary | ICD-10-CM | POA: Diagnosis present

## 2023-09-17 DIAGNOSIS — F1290 Cannabis use, unspecified, uncomplicated: Secondary | ICD-10-CM | POA: Diagnosis present

## 2023-09-17 DIAGNOSIS — J439 Emphysema, unspecified: Secondary | ICD-10-CM | POA: Diagnosis present

## 2023-09-17 DIAGNOSIS — R0602 Shortness of breath: Secondary | ICD-10-CM | POA: Diagnosis present

## 2023-09-17 DIAGNOSIS — J1569 Pneumonia due to other gram-negative bacteria: Secondary | ICD-10-CM | POA: Diagnosis not present

## 2023-09-17 DIAGNOSIS — Z1152 Encounter for screening for COVID-19: Secondary | ICD-10-CM

## 2023-09-17 DIAGNOSIS — F32A Depression, unspecified: Secondary | ICD-10-CM | POA: Diagnosis present

## 2023-09-17 DIAGNOSIS — F1910 Other psychoactive substance abuse, uncomplicated: Secondary | ICD-10-CM | POA: Diagnosis present

## 2023-09-17 DIAGNOSIS — B20 Human immunodeficiency virus [HIV] disease: Principal | ICD-10-CM

## 2023-09-17 DIAGNOSIS — R609 Edema, unspecified: Secondary | ICD-10-CM | POA: Diagnosis present

## 2023-09-17 DIAGNOSIS — Z8614 Personal history of Methicillin resistant Staphylococcus aureus infection: Secondary | ICD-10-CM

## 2023-09-17 DIAGNOSIS — K760 Fatty (change of) liver, not elsewhere classified: Secondary | ICD-10-CM | POA: Diagnosis present

## 2023-09-17 DIAGNOSIS — Z91030 Bee allergy status: Secondary | ICD-10-CM

## 2023-09-17 DIAGNOSIS — Z87442 Personal history of urinary calculi: Secondary | ICD-10-CM

## 2023-09-17 DIAGNOSIS — Z91018 Allergy to other foods: Secondary | ICD-10-CM

## 2023-09-17 LAB — BASIC METABOLIC PANEL
Anion gap: 10 (ref 5–15)
BUN: 13 mg/dL (ref 6–20)
CO2: 24 mmol/L (ref 22–32)
Calcium: 8.5 mg/dL — ABNORMAL LOW (ref 8.9–10.3)
Chloride: 99 mmol/L (ref 98–111)
Creatinine, Ser: 1.22 mg/dL (ref 0.61–1.24)
GFR, Estimated: >60 mL/min (ref >60)
Glucose, Bld: 99 mg/dL (ref 70–99)
Potassium: 3.5 mmol/L (ref 3.5–5.1)
Sodium: 133 mmol/L — ABNORMAL LOW (ref 135–145)

## 2023-09-17 LAB — CBC
HCT: 37.2 % — ABNORMAL LOW (ref 39.0–52.0)
Hemoglobin: 12.4 g/dL — ABNORMAL LOW (ref 13.0–17.0)
MCH: 32.4 pg (ref 26.0–34.0)
MCHC: 33.3 g/dL (ref 30.0–36.0)
MCV: 97.1 fL (ref 80.0–100.0)
Platelets: 158 10*3/uL (ref 150–400)
RBC: 3.83 MIL/uL — ABNORMAL LOW (ref 4.22–5.81)
RDW: 11.7 % (ref 11.5–15.5)
WBC: 7.4 10*3/uL (ref 4.0–10.5)
nRBC: 0 % (ref 0.0–0.2)

## 2023-09-17 LAB — TROPONIN I (HIGH SENSITIVITY): Troponin I (High Sensitivity): 13 ng/L (ref <18)

## 2023-09-17 MED ORDER — SODIUM CHLORIDE 0.9 % IV SOLN
2.0000 g | Freq: Once | INTRAVENOUS | Status: AC
Start: 2023-09-17 — End: 2023-09-17
  Administered 2023-09-17: 2 g via INTRAVENOUS
  Filled 2023-09-17: qty 20

## 2023-09-17 MED ORDER — MELATONIN 3 MG PO TABS
3.0000 mg | ORAL_TABLET | Freq: Every evening | ORAL | Status: DC | PRN
  Administered 2023-09-19: 3 mg via ORAL
  Filled 2023-09-17: qty 1

## 2023-09-17 MED ORDER — ACETAMINOPHEN 500 MG PO TABS
1000.0000 mg | ORAL_TABLET | ORAL | Status: AC
  Administered 2023-09-17: 1000 mg via ORAL
  Filled 2023-09-17: qty 2

## 2023-09-17 MED ORDER — SODIUM CHLORIDE 0.9 % IV SOLN
500.0000 mg | Freq: Once | INTRAVENOUS | Status: AC
  Administered 2023-09-17: 500 mg via INTRAVENOUS
  Filled 2023-09-17: qty 5

## 2023-09-17 MED ORDER — ENOXAPARIN SODIUM 40 MG/0.4ML IJ SOSY
40.0000 mg | PREFILLED_SYRINGE | INTRAMUSCULAR | Status: DC
  Administered 2023-09-18 – 2023-09-22 (×5): 40 mg via SUBCUTANEOUS
  Filled 2023-09-17 (×5): qty 0.4

## 2023-09-17 MED ORDER — OXYCODONE HCL 5 MG PO TABS
5.0000 mg | ORAL_TABLET | Freq: Four times a day (QID) | ORAL | Status: DC | PRN
  Administered 2023-09-18 – 2023-09-22 (×9): 5 mg via ORAL
  Filled 2023-09-17 (×10): qty 1

## 2023-09-17 MED ORDER — GABAPENTIN 300 MG PO CAPS
300.0000 mg | ORAL_CAPSULE | Freq: Three times a day (TID) | ORAL | Status: DC
  Administered 2023-09-18 – 2023-09-22 (×15): 300 mg via ORAL
  Filled 2023-09-17 (×15): qty 1

## 2023-09-17 MED ORDER — ACETAMINOPHEN 650 MG RE SUPP: 650.0000 mg | Freq: Four times a day (QID) | RECTAL | Status: DC | PRN

## 2023-09-17 MED ORDER — ATORVASTATIN CALCIUM 40 MG PO TABS
40.0000 mg | ORAL_TABLET | Freq: Every day | ORAL | Status: DC
  Administered 2023-09-18 – 2023-09-22 (×5): 40 mg via ORAL
  Filled 2023-09-17 (×5): qty 1

## 2023-09-17 MED ORDER — ONDANSETRON HCL 4 MG PO TABS: 4.0000 mg | ORAL_TABLET | Freq: Four times a day (QID) | ORAL | Status: DC | PRN

## 2023-09-17 MED ORDER — PANTOPRAZOLE SODIUM 40 MG PO TBEC
40.0000 mg | DELAYED_RELEASE_TABLET | Freq: Every day | ORAL | Status: DC
  Administered 2023-09-18 – 2023-09-22 (×5): 40 mg via ORAL
  Filled 2023-09-17 (×5): qty 1

## 2023-09-17 MED ORDER — ONDANSETRON HCL 4 MG/2ML IJ SOLN: 4.0000 mg | Freq: Four times a day (QID) | INTRAMUSCULAR | Status: DC | PRN

## 2023-09-17 MED ORDER — SORBITOL 70 % SOLN: 30.0000 mL | Freq: Every day | Status: DC | PRN

## 2023-09-17 MED ORDER — ACETAMINOPHEN 325 MG PO TABS
650.0000 mg | ORAL_TABLET | Freq: Four times a day (QID) | ORAL | Status: DC | PRN
  Administered 2023-09-21: 650 mg via ORAL
  Filled 2023-09-17: qty 2
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
## 2023-09-17 NOTE — ED Provider Triage Note (Signed)
Emergency Medicine Provider Triage Evaluation Note  Curtis Hodge , a 57 y.o. male  was evaluated in triage.  Pt complains of SOB.  Review of Systems  Positive: SOB, chest tightness Negative: Fever,   Physical Exam  BP (!) 164/64   Pulse (!) 110   Temp 98.3 F (36.8 C)   Resp 20   SpO2 98%  Gen:   Awake, no distress   Resp:  Normal effort No wheezing currently MSK:   Moves extremities without difficulty right BKA Other:    Medical Decision Making  Medically screening exam initiated at 5:56 PM.  Appropriate orders placed.  Curtis Hodge was informed that the remainder of the evaluation will be completed by another provider, this initial triage assessment does not replace that evaluation, and the importance of remaining in the ED until their evaluation is complete.  Progressively worsening SOB for about one week. H/O COPD, recently quit smoker.    Elpidio Anis, PA-C 09/17/23 1758

## 2023-09-17 NOTE — ED Triage Notes (Signed)
Pt is coming in for [facility] that has been ongoing for about a week, has a Hx of COPD/emphysema, Pt expresses it feels like he is breathing through " oatmeal ", does admit to smoking cigarettes. Pt is otherwise stable with some chest pain currently.
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
## 2023-09-18 ENCOUNTER — Inpatient Hospital Stay (HOSPITAL_COMMUNITY): Payer: MEDICAID

## 2023-09-18 ENCOUNTER — Other Ambulatory Visit: Payer: Self-pay

## 2023-09-18 DIAGNOSIS — J1569 Pneumonia due to other gram-negative bacteria: Secondary | ICD-10-CM

## 2023-09-18 LAB — CBC
HCT: 35.8 % — ABNORMAL LOW (ref 39.0–52.0)
Hemoglobin: 12.1 g/dL — ABNORMAL LOW (ref 13.0–17.0)
MCH: 31.6 pg (ref 26.0–34.0)
MCHC: 33.8 g/dL (ref 30.0–36.0)
MCV: 93.5 fL (ref 80.0–100.0)
Platelets: 133 10*3/uL — ABNORMAL LOW (ref 150–400)
RBC: 3.83 MIL/uL — ABNORMAL LOW (ref 4.22–5.81)
RDW: 11.9 % (ref 11.5–15.5)
WBC: 5.6 10*3/uL (ref 4.0–10.5)
nRBC: 0 % (ref 0.0–0.2)

## 2023-09-18 LAB — BASIC METABOLIC PANEL
Anion gap: 12 (ref 5–15)
BUN: 12 mg/dL (ref 6–20)
CO2: 25 mmol/L (ref 22–32)
Calcium: 8.3 mg/dL — ABNORMAL LOW (ref 8.9–10.3)
Chloride: 101 mmol/L (ref 98–111)
Creatinine, Ser: 0.94 mg/dL (ref 0.61–1.24)
GFR, Estimated: >60 mL/min (ref >60)
Glucose, Bld: 87 mg/dL (ref 70–99)
Potassium: 3.6 mmol/L (ref 3.5–5.1)
Sodium: 138 mmol/L (ref 135–145)

## 2023-09-18 LAB — PROCALCITONIN: Procalcitonin: 0.5 ng/mL

## 2023-09-18 LAB — RESP PANEL BY RT-PCR (RSV, FLU A&B, COVID)  RVPGX2
Influenza A by PCR: NEGATIVE
Influenza B by PCR: NEGATIVE
Resp Syncytial Virus by PCR: NEGATIVE
SARS Coronavirus 2 by RT PCR: NEGATIVE

## 2023-09-18 LAB — LACTATE DEHYDROGENASE: LDH: 159 U/L (ref 98–192)

## 2023-09-18 LAB — MRSA NEXT GEN BY PCR, NASAL: MRSA by PCR Next Gen: NOT DETECTED

## 2023-09-18 LAB — TROPONIN I (HIGH SENSITIVITY): Troponin I (High Sensitivity): 17 ng/L (ref <18)

## 2023-09-18 LAB — HEPATITIS C ANTIBODY: HCV Ab: NONREACTIVE

## 2023-09-18 MED ORDER — GUAIFENESIN 100 MG/5ML PO LIQD: 5.0000 mL | ORAL | Status: DC | PRN

## 2023-09-18 MED ORDER — IOHEXOL 350 MG/ML SOLN
50.0000 mL | Freq: Once | INTRAVENOUS | Status: AC | PRN
  Administered 2023-09-18: 50 mL via INTRAVENOUS

## 2023-09-18 MED ORDER — GADOBUTROL 1 MMOL/ML IV SOLN
7.0000 mL | Freq: Once | INTRAVENOUS | Status: AC | PRN
  Administered 2023-09-18: 7 mL via INTRAVENOUS

## 2023-09-18 MED ORDER — SODIUM CHLORIDE 0.9 % IV SOLN: 2.0000 g | Freq: Once | INTRAVENOUS | Status: DC

## 2023-09-18 MED ORDER — HYDRALAZINE HCL 20 MG/ML IJ SOLN: 10.0000 mg | INTRAMUSCULAR | Status: DC | PRN

## 2023-09-18 MED ORDER — VANCOMYCIN HCL 1500 MG/300ML IV SOLN
1500.0000 mg | Freq: Once | INTRAVENOUS | Status: AC
  Administered 2023-09-18: 1500 mg via INTRAVENOUS
  Filled 2023-09-18: qty 300

## 2023-09-18 MED ORDER — IPRATROPIUM-ALBUTEROL 0.5-2.5 (3) MG/3ML IN SOLN: 3.0000 mL | RESPIRATORY_TRACT | Status: DC | PRN

## 2023-09-18 MED ORDER — SODIUM CHLORIDE 0.9 % IV SOLN: 2.0000 g | Freq: Three times a day (TID) | INTRAVENOUS | Status: DC

## 2023-09-18 MED ORDER — SULFAMETHOXAZOLE-TRIMETHOPRIM 800-160 MG PO TABS
1.0000 | ORAL_TABLET | Freq: Two times a day (BID) | ORAL | Status: DC
  Administered 2023-09-18 (×2): 1 via ORAL
  Filled 2023-09-18 (×3): qty 1

## 2023-09-18 MED ORDER — METOPROLOL TARTRATE 5 MG/5ML IV SOLN: 5.0000 mg | INTRAVENOUS | Status: DC | PRN

## 2023-09-18 MED ORDER — SODIUM CHLORIDE 0.9 % IV SOLN
2.0000 g | Freq: Two times a day (BID) | INTRAVENOUS | Status: DC
  Administered 2023-09-18 (×2): 2 g via INTRAVENOUS
  Filled 2023-09-18 (×2): qty 12.5

## 2023-09-18 MED ORDER — VANCOMYCIN HCL IN DEXTROSE 1-5 GM/200ML-% IV SOLN
1000.0000 mg | Freq: Two times a day (BID) | INTRAVENOUS | Status: DC
  Administered 2023-09-18 (×2): 1000 mg via INTRAVENOUS
  Filled 2023-09-18 (×3): qty 200

## 2023-09-18 MED ORDER — HYDROMORPHONE HCL 1 MG/ML IJ SOLN
0.5000 mg | Freq: Once | INTRAMUSCULAR | Status: AC
  Administered 2023-09-18: 0.5 mg via INTRAVENOUS
  Filled 2023-09-18: qty 1
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
## 2023-09-18 NOTE — Care Management (Signed)
Transition of Care [facility]) - Inpatient Brief Assessment   Patient Details  Name: Curtis Hodge MRN: [id number] Date of Birth: 29-Jul-1966  Transition of Care [facility]) CM/SW Contact:    Lockie Pares, RN Phone Number: 09/18/2023, 11:34 AM   Clinical Narrative:  57 Yo hx of HIV in with [facility] pneumonia. He states he has been taking medications as prescribed and he is off illicit drugs. Patient is receiving IV abx. Hx of BKA NO needs identified at this time. If a need is identified, please place [facility] consult  Transition of Care Asessment: Insurance and Status: Insurance coverage has been reviewed Patient has primary care physician: Yes Home environment has been reviewed: Y Prior level of function:: Independent Prior/Current Home Services: No current home services Social Determinants of Health Reivew: SDOH reviewed no interventions necessary Readmission risk has been reviewed: Yes Transition of care needs: no transition of care needs at this time

## 2023-09-18 NOTE — Progress Notes (Signed)
Patient ID: Curtis Hodge, male   DOB: 19-Sep-1966, 57 y.o.   MRN: [id number] The patient is a 57 year old gentleman who was admitted to the hospital yesterday secondary to pneumonia but also with complaints of right lower extremity pain around a previous right BKA.  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  He has been able to get into a prosthesis.  In July of this year he had a prepatellar bursal infection that was treated successfully with surgery.  He reports that he has had significant difficulty putting on his prosthesis with pain all around his right BKA stump.  There has been no draining or open wounds.  He points to the medial aspect of his knee joint and the tibial tubercle is 2 sources of pain.  He is very sensitive he reports on the lateral side of his leg near the distal aspect of his amputation stump.  On examination of his right lower extremity, there are no open wounds or draining wounds around his right BKA stump.  There is no knee joint effusion that I can see on exam and he is able to easily flex and extend fully his knee.  There is slight callus formation around the medial femoral condyle and this may be from prosthetic wear and how his prosthesis itself is fitting.  The distal end of the stump clinically looks good.  Again there is no redness or swelling or open wounds.  There is no fluctuation of the tissue.  He is showing some hypersensitivity to light touch along the lateral BKA stump and this may be a symptomatic neuroma potentially.  I was able to review the MRI studies and I see where the radiologist pointing to some edema in the distal tibia and this certainly can be osteomyelitis.  It can also be from stress changes from where the patient is no weightbearing to his prosthesis.  There is no fluid collections or evidence of abscess.  Fortunately I do not see that there is a need for an urgent or  emergent procedure or surgery on the patient's right BKA stump.  My partner Dr. Lajoyce Corners who performed the patient's BKA is unavailable the next few days.  I will make sure that I communicate with Dr. Lajoyce Corners on Monday that the patient needs further orthopedic follow-up as a relates to his BKA.  If the patient is in the hospital I will let Dr. Lajoyce Corners know to come by and see him.  If the patient does get discharged, we will arrange a follow-up with Dr. Lajoyce Corners next week.

## 2023-09-18 NOTE — Progress Notes (Signed)
PROGRESS NOTE    Curtis Hodge  CWC:[id number] DOB: 1965-12-05 DOA: 09/17/2023 PCP: Hoy Register, MD   Brief Narrative:  57 year old with history of HIV not on any antiretrovirals, osteoarthritis status post right BKA, septic right knee in July 2024, schizophrenia was sitting in a wheelchair when he started vomiting and apparently passed out.  He has been reporting of pain and discomfort around his right stump for the past 2 weeks.  Came to the ER 10 days ago, x-rays were negative and he was discharged with pain medications.  Today chest x-ray reveals concerns of right middle lobe pneumonia along with fever.   Assessment & Plan:  Principal Problem:   Pneumonia Active Problems:   Diarrhea   HIV disease (HCC)   Substance abuse (HCC)   Cocaine abuse (HCC)   Right middle lobe pneumonia Possible right stump cellulitis - Off-and-on struggling with right lower extremity/stump infection.  There is also concerns of right middle lobe pneumonia.  In the setting of immunocompromise state, continue broad-spectrum antibiotics.  MRI of right stump concerning for osteo, Dr Lajoyce Corners consulted. Will consult ID as well.   History of HIV/AIDS -Unfortunately he has been noncompliant with his medications.  History of osteoarthritis status post right BKA -Supportive care as above  Polysubstance abuse - Counseled  Schizophrenia/depression -Does not appear to be any home medications  Hyperlipidemia - Statin  DVT prophylaxis: Lovenox Code Status: Confirmed DNR by admitting provider Family Communication:   Status is: Inpatient Remains inpatient appropriate because: Ongoing evaluation for sepsis    Subjective: Seen at bedside, feels ok for now.  Been off his HIV meds for a while   Examination:  General exam: Appears calm and comfortable  Respiratory system: mild basilar rhonchi Cardiovascular system: S1 & S2 heard, RRR. No JVD, murmurs, rubs, gallops or clicks. No pedal  edema. Gastrointestinal system: Abdomen is nondistended, soft and nontender. No organomegaly or masses felt. Normal bowel sounds heard. Central nervous system: Alert and oriented. No focal neurological deficits. Extremities: right bka Skin: No rashes, lesions or ulcers Psychiatry: Judgement and insight appear normal. Mood & affect appropriate.      Diet Orders (From admission, onward)     Start     Ordered   09/17/23 2313  Diet regular Room service appropriate? Yes; Fluid consistency: Thin  Diet effective now       Question Answer Comment  Room service appropriate? Yes   Fluid consistency: Thin      09/17/23 2313            Objective: Vitals:   09/18/23 0129 09/18/23 0211 09/18/23 0510 09/18/23 0741  BP:  127/75 110/66 100/64  Pulse:  65 70 78  Resp:  19 16 20   Temp: 97.8 F (36.6 C) 97.9 F (36.6 C) 97.9 F (36.6 C) 98 F (36.7 C)  TempSrc: Oral Oral Oral Oral  SpO2:  99% 92% 93%  Weight:  67 kg    Height:        Intake/Output Summary (Last 24 hours) at 09/18/2023 0836 Last data filed at 09/18/2023 0804 Gross per 24 hour  Intake 655.44 ml  Output 400 ml  Net 255.44 ml   Filed Weights   09/17/23 2320 09/18/23 0211  Weight: 77.1 kg 67 kg    Scheduled Meds:  atorvastatin  40 mg Oral Daily   enoxaparin (LOVENOX) injection  40 mg Subcutaneous Q24H   gabapentin  300 mg Oral TID   pantoprazole  40 mg Oral Daily   sulfamethoxazole-trimethoprim  1 tablet Oral Q12H   Continuous Infusions:  ceFEPime (MAXIPIME) IV 2 g (09/18/23 0736)   vancomycin      Nutritional status     Body mass index is 21.19 kg/m.  Data Reviewed:   CBC: Recent Labs  Lab 09/17/23 1749 09/18/23 0535  WBC 7.4 5.6  HGB 12.4* 12.1*  HCT 37.2* 35.8*  MCV 97.1 93.5  PLT 158 133*   Basic Metabolic Panel: Recent Labs  Lab 09/17/23 1749 09/18/23 0535  NA 133* 138  K 3.5 3.6  CL 99 101  CO2 24 25  GLUCOSE 99 87  BUN 13 12  CREATININE 1.22 0.94  CALCIUM 8.5* 8.3*    GFR: Estimated Creatinine Clearance: 83.2 mL/min (by C-G formula based on SCr of 0.94 mg/dL). Liver Function Tests: No results for input(s): "AST", "ALT", "ALKPHOS", "BILITOT", "PROT", "ALBUMIN" in the last 168 hours. No results for input(s): "LIPASE", "AMYLASE" in the last 168 hours. No results for input(s): "AMMONIA" in the last 168 hours. Coagulation Profile: No results for input(s): "INR", "PROTIME" in the last 168 hours. Cardiac Enzymes: No results for input(s): "CKTOTAL", "CKMB", "CKMBINDEX", "TROPONINI" in the last 168 hours. BNP (last 3 results) No results for input(s): "PROBNP" in the last 8760 hours. HbA1C: No results for input(s): "HGBA1C" in the last 72 hours. CBG: No results for input(s): "GLUCAP" in the last 168 hours. Lipid Profile: No results for input(s): "CHOL", "HDL", "LDLCALC", "TRIG", "CHOLHDL", "LDLDIRECT" in the last 72 hours. Thyroid Function Tests: No results for input(s): "TSH", "T4TOTAL", "FREET4", "T3FREE", "THYROIDAB" in the last 72 hours. Anemia Panel: No results for input(s): "VITAMINB12", "FOLATE", "FERRITIN", "TIBC", "IRON", "RETICCTPCT" in the last 72 hours. Sepsis Labs: No results for input(s): "PROCALCITON", "LATICACIDVEN" in the last 168 hours.  Recent Results (from the past 240 hour(s))  Blood culture (routine x 2)     Status: None (Preliminary result)   Collection Time: 09/17/23  9:23 PM   Specimen: BLOOD  Result Value Ref Range Status   Specimen Description BLOOD RIGHT ANTECUBITAL  Final   Special Requests   Final    BOTTLES DRAWN AEROBIC AND ANAEROBIC Blood Culture adequate volume   Culture   Final    NO GROWTH < 12 HOURS Performed at [facility], [address] [place], [place] [address]    Report Status PENDING  Incomplete  Blood culture (routine x 2)     Status: None (Preliminary result)   Collection Time: 09/17/23  9:23 PM   Specimen: BLOOD  Result Value Ref Range Status   Specimen Description BLOOD LEFT ANTECUBITAL   Final   Special Requests   Final    BOTTLES DRAWN AEROBIC AND ANAEROBIC Blood Culture adequate volume   Culture   Final    NO GROWTH < 12 HOURS Performed at [facility], [address], [place], [place] [address]    Report Status PENDING  Incomplete  Resp panel by RT-PCR (RSV, Flu A&B, Covid) Anterior Nasal Swab     Status: None   Collection Time: 09/18/23  2:28 AM   Specimen: Anterior Nasal Swab  Result Value Ref Range Status   SARS Coronavirus 2 by RT PCR NEGATIVE NEGATIVE Final   Influenza A by PCR NEGATIVE NEGATIVE Final   Influenza B by PCR NEGATIVE NEGATIVE Final    Comment: (NOTE) The Xpert Xpress SARS-CoV-2/FLU/RSV plus assay is intended as an aid in the diagnosis of influenza from Nasopharyngeal swab specimens and should not be used as a sole basis  for treatment. Nasal washings and aspirates are unacceptable for Xpert Xpress SARS-CoV-2/FLU/RSV testing.  Fact Sheet for Patients: BloggerCourse.com  Fact Sheet for Healthcare Providers: SeriousBroker.it  This test is not yet approved or cleared by the [place] FDA and has been authorized for detection and/or diagnosis of SARS-CoV-2 by FDA under an Emergency Use Authorization (EUA). This EUA will remain in effect (meaning this test can be used) for the duration of the COVID-19 declaration under Section 564(b)(1) of the Act, 21 U.S.C. section 360bbb-3(b)(1), unless the authorization is terminated or revoked.     Resp Syncytial Virus by PCR NEGATIVE NEGATIVE Final    Comment: (NOTE) Fact Sheet for Patients: BloggerCourse.com  Fact Sheet for Healthcare Providers: SeriousBroker.it  This test is not yet approved or cleared by the [place] FDA and has been authorized for detection and/or diagnosis of SARS-CoV-2 by FDA under an Emergency Use Authorization (EUA). This EUA will remain in effect (meaning this  test can be used) for the duration of the COVID-19 declaration under Section 564(b)(1) of the Act, 21 U.S.C. section 360bbb-3(b)(1), unless the authorization is terminated or revoked.  Performed at [facility], [address], [place], [place] [address]          Radiology Studies: DG Chest 2 View  Result Date: 09/17/2023 CLINICAL DATA:  COPD EXAM: CHEST - 2 VIEW COMPARISON:  05/15/2023 FINDINGS: Heart size and pulmonary vascularity are normal. Severe emphysematous changes throughout the lungs with large bullous changes in the right upper lung. Central interstitial changes with peribronchial thickening suggesting chronic bronchitis. Superimposed area of consolidation in the anterior right upper lung, new since prior study. This likely represents focal pneumonia. Follow-up to resolution is recommended to exclude underlying focal lesion. No pleural effusions. No pneumothorax. Mediastinal contours appear intact. IMPRESSION: 1. Focal consolidation in the anterior right upper lung, new since prior study. This is likely pneumonia but follow-up to resolution is recommended to exclude underlying focal lesion. 2. Severe emphysematous changes and bronchitic changes in the lungs similar to prior study. Electronically Signed   By: Burman Nieves M.D.   On: 09/17/2023 19:42           LOS: 1 day   Time spent= 35 mins    Miguel Rota, MD [facility]  If 7PM-7AM, please contact night-coverage  09/18/2023, 8:36 AM

## 2023-09-18 NOTE — Progress Notes (Signed)
Pharmacy Antibiotic Note  Curtis Hodge is a 57 y.o. male admitted on 09/17/2023 with fever and poss RLE  cellulitis.  Pharmacy has been consulted for Vancomycin and Cefepime  dosing.  Plan: Vancomycin 1500 mg IV now, then Vancomycin 1000 mg IV q12h Cefepime 2 g IV q12h  Height: 5\' 10"  (177.8 cm) Weight: 77.1 kg (169 lb 15.6 oz) IBW/kg (Calculated) : 73  Temp (24hrs), Avg:99 F (37.2 C), Min:97.8 F (36.6 C), Max:100.5 F (38.1 C)  Recent Labs  Lab 09/17/23 1749  WBC 7.4  CREATININE 1.22    Estimated Creatinine Clearance: 69.8 mL/min (by C-G formula based on SCr of 1.22 mg/dL).    Allergies  Allergen Reactions   Bee Pollen     bees   Beet Root [Germanium]     beets     Eddie Candle 09/18/2023 1:32 AM

## 2023-09-19 ENCOUNTER — Other Ambulatory Visit (HOSPITAL_COMMUNITY): Payer: Self-pay

## 2023-09-19 DIAGNOSIS — F17210 Nicotine dependence, cigarettes, uncomplicated: Secondary | ICD-10-CM

## 2023-09-19 DIAGNOSIS — B181 Chronic viral hepatitis B without delta-agent: Secondary | ICD-10-CM | POA: Diagnosis not present

## 2023-09-19 DIAGNOSIS — J189 Pneumonia, unspecified organism: Secondary | ICD-10-CM | POA: Diagnosis not present

## 2023-09-19 DIAGNOSIS — B20 Human immunodeficiency virus [HIV] disease: Secondary | ICD-10-CM | POA: Diagnosis not present

## 2023-09-19 LAB — HEPATIC FUNCTION PANEL
ALT: 37 U/L (ref 0–44)
AST: 37 U/L (ref 15–41)
Albumin: 2.3 g/dL — ABNORMAL LOW (ref 3.5–5.0)
Alkaline Phosphatase: 70 U/L (ref 38–126)
Bilirubin, Direct: <0.1 mg/dL (ref 0.0–0.2)
Total Bilirubin: 0.4 mg/dL (ref 0.3–1.2)
Total Protein: 6.6 g/dL (ref 6.5–8.1)

## 2023-09-19 LAB — CBC
HCT: 35.3 % — ABNORMAL LOW (ref 39.0–52.0)
Hemoglobin: 11.7 g/dL — ABNORMAL LOW (ref 13.0–17.0)
MCH: 31.7 pg (ref 26.0–34.0)
MCHC: 33.1 g/dL (ref 30.0–36.0)
MCV: 95.7 fL (ref 80.0–100.0)
Platelets: 159 10*3/uL (ref 150–400)
RBC: 3.69 MIL/uL — ABNORMAL LOW (ref 4.22–5.81)
RDW: 11.9 % (ref 11.5–15.5)
WBC: 4.9 10*3/uL (ref 4.0–10.5)
nRBC: 0 % (ref 0.0–0.2)

## 2023-09-19 LAB — BASIC METABOLIC PANEL
Anion gap: 6 (ref 5–15)
BUN: 7 mg/dL (ref 6–20)
CO2: 24 mmol/L (ref 22–32)
Calcium: 7.9 mg/dL — ABNORMAL LOW (ref 8.9–10.3)
Chloride: 106 mmol/L (ref 98–111)
Creatinine, Ser: 1.03 mg/dL (ref 0.61–1.24)
GFR, Estimated: >60 mL/min (ref >60)
Glucose, Bld: 107 mg/dL — ABNORMAL HIGH (ref 70–99)
Potassium: 3.4 mmol/L — ABNORMAL LOW (ref 3.5–5.1)
Sodium: 136 mmol/L (ref 135–145)

## 2023-09-19 LAB — PHOSPHORUS: Phosphorus: 2.2 mg/dL — ABNORMAL LOW (ref 2.5–4.6)

## 2023-09-19 LAB — BRAIN NATRIURETIC PEPTIDE: B Natriuretic Peptide: 74.5 pg/mL (ref 0.0–100.0)

## 2023-09-19 LAB — HEPATITIS B E ANTIBODY: Hep B E Ab: NONREACTIVE

## 2023-09-19 LAB — HIV-1 RNA QUANT-NO REFLEX-BLD
HIV 1 RNA Quant: 24300 {copies}/mL
LOG10 HIV-1 RNA: 4.386 {Log}

## 2023-09-19 LAB — RPR: RPR Ser Ql: NONREACTIVE

## 2023-09-19 LAB — T-HELPER CELLS (CD4) COUNT (NOT AT ARMC)
CD4 % Helper T Cell: 17 % — ABNORMAL LOW (ref 33–65)
CD4 T Cell Abs: 102 /uL — ABNORMAL LOW (ref 400–1790)

## 2023-09-19 LAB — MAGNESIUM: Magnesium: 2 mg/dL (ref 1.7–2.4)

## 2023-09-19 LAB — HEPATITIS B E ANTIGEN: Hep B E Ag: POSITIVE — AB

## 2023-09-19 MED ORDER — AMOXICILLIN-POT CLAVULANATE 875-125 MG PO TABS
1.0000 | ORAL_TABLET | Freq: Two times a day (BID) | ORAL | Status: DC
  Administered 2023-09-19 – 2023-09-22 (×7): 1 via ORAL
  Filled 2023-09-19 (×8): qty 1

## 2023-09-19 MED ORDER — DOXYCYCLINE HYCLATE 100 MG PO TABS
100.0000 mg | ORAL_TABLET | Freq: Two times a day (BID) | ORAL | 0 refills | Status: AC
  Filled 2023-09-19: qty 56, 28d supply, fill #0

## 2023-09-19 MED ORDER — BICTEGRAVIR-EMTRICITAB-TENOFOV 50-200-25 MG PO TABS
1.0000 | ORAL_TABLET | Freq: Every day | ORAL | Status: DC
  Administered 2023-09-19 – 2023-09-22 (×4): 1 via ORAL
  Filled 2023-09-19 (×4): qty 1

## 2023-09-19 MED ORDER — VANCOMYCIN HCL 750 MG/150ML IV SOLN
750.0000 mg | Freq: Two times a day (BID) | INTRAVENOUS | Status: DC
  Filled 2023-09-19: qty 150

## 2023-09-19 MED ORDER — SULFAMETHOXAZOLE-TRIMETHOPRIM 800-160 MG PO TABS
1.0000 | ORAL_TABLET | Freq: Every day | ORAL | Status: DC
  Administered 2023-09-19 – 2023-09-22 (×4): 1 via ORAL
  Filled 2023-09-19 (×4): qty 1

## 2023-09-19 MED ORDER — BIKTARVY 50-200-25 MG PO TABS
1.0000 | ORAL_TABLET | Freq: Every day | ORAL | 0 refills | Status: AC
  Filled 2023-09-19: qty 30, 30d supply, fill #0

## 2023-09-19 MED ORDER — SULFAMETHOXAZOLE-TRIMETHOPRIM 800-160 MG PO TABS
1.0000 | ORAL_TABLET | Freq: Every day | ORAL | 0 refills | Status: AC
  Filled 2023-09-19: qty 30, 30d supply, fill #0

## 2023-09-19 MED ORDER — BIKTARVY 50-200-25 MG PO TABS
1.0000 | ORAL_TABLET | Freq: Every day | ORAL | 11 refills | Status: DC
  Filled 2023-09-19: qty 30, 30d supply, fill #0

## 2023-09-19 MED ORDER — DOXYCYCLINE HYCLATE 100 MG PO TABS
100.0000 mg | ORAL_TABLET | Freq: Two times a day (BID) | ORAL | Status: DC
  Administered 2023-09-19 – 2023-09-22 (×7): 100 mg via ORAL
  Filled 2023-09-19 (×8): qty 1

## 2023-09-19 MED ORDER — AMOXICILLIN-POT CLAVULANATE 875-125 MG PO TABS
1.0000 | ORAL_TABLET | Freq: Two times a day (BID) | ORAL | 0 refills | Status: AC
  Filled 2023-09-19: qty 56, 28d supply, fill #0

## 2023-09-19 NOTE — TOC Progression Note (Addendum)
Transition of Care [facility]) - Progression Note    Patient Details  Name: Curtis Hodge MRN: [id number] Date of Birth: May 09, 1966  Transition of Care [facility]) CM/SW Contact  Lockie Pares, RN Phone Number: 09/19/2023, 1:27 PM  Clinical Narrative:      Spoke to patient regarding DME.  He lives with his cousin who takes care of him. He has no respite services at this time. He has [employer]. He has PCP in Dr Alvis Lemmings. Discussed PCS services, and possibility of this to discuss with PCP. Marland Kitchen He has to go up [place] to get to bus. It is difficult to do this with a manual wheelchair.   Reached out to [employer] for eligibility for motorized wheelchair. Continues with IV antibiotics .   Home health would be unlikely due to insurance.  TOC will continue to follow  1345 message back from [employer] that motorized wheelchairs are done on outpatient basis. Called PCP dr Alvis Lemmings and scheduled a post hospital appointment with a focus on obtaining PCS and motorized wheelchair.  Discussed with patient, about plan to follow up  with PCP regarding electric wheelchair and PCS services.   Expected Discharge Plan and Services      DC home                                         Social Determinants of Health (SDOH) Interventions SDOH Screenings   Food Insecurity: No Food Insecurity (09/17/2023)  Housing: Patient Declined (09/17/2023)  Transportation Needs: No Transportation Needs (09/17/2023)  Utilities: Not At Risk (09/17/2023)  Alcohol Screen: Low Risk  (10/25/2017)  Depression (PHQ2-9): High Risk (02/26/2023)  Tobacco Use: High Risk (09/17/2023)    Readmission Risk Interventions     No data to display
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
## 2023-09-19 NOTE — Hospital Course (Addendum)
  Brief Narrative:  57 year old with history of HIV not on any antiretrovirals, osteoarthritis status post right BKA, septic right knee in July 2024, schizophrenia was sitting in a wheelchair when he started vomiting and apparently passed out.  He has been reporting of pain and discomfort around his right stump for the past 2 weeks.  Came to the ER 10 days ago, x-rays were negative and he was discharged with pain medications.  Today chest x-ray reveals concerns of right middle lobe pneumonia along with fever.  MRI of his right stump revealed possible osteomyelitis changes, orthopedic consulted.  Recommend monitoring for now, primary orthopedic Dr. Lajoyce Corners will return on Monday and evaluate.  If patient is to be discharged, they will arrange for outpatient follow-up.     Assessment & Plan:  Principal Problem:   Pneumonia Active Problems:   Diarrhea   HIV disease (HCC)   Substance abuse (HCC)   Cocaine abuse (HCC)   Right middle lobe pneumonia Possible right stump cellulitis - Off-and-on struggling with right lower extremity/stump infection.  There is also concerns of right middle lobe pneumonia.  MRI of right stump concerning for osteo, Dr Duda/Dr. Magnus Ivan consulted from orthopedic.  Recommend medical management for now.  Dr. Lajoyce Corners will evaluate the patient on Monday if patient is still here otherwise outpatient follow-up.  ID recommending doxycycline and Augmentin for total of 2 weeks.  EOT 11/13   History of HIV/AIDS -Unfortunately he has been noncompliant with his medications.  Now started on Biktarvy and Bactrim   History of osteoarthritis status post right BKA -Supportive care as above   Polysubstance abuse - Counseled   Schizophrenia/depression -Does not appear to be any home medications   Hyperlipidemia - Statin   DVT prophylaxis: Lovenox Code Status: Confirmed DNR by admitting provider Family Communication:   Status is: Inpatient Remains inpatient appropriate because: Ongoing  evaluation for sepsis       Subjective: Doing ok no complaints.      Examination:   General exam: Appears calm and comfortable  Respiratory system: mild basilar rhonchi Cardiovascular system: S1 & S2 heard, RRR. No JVD, murmurs, rubs, gallops or clicks. No pedal edema. Gastrointestinal system: Abdomen is nondistended, soft and nontender. No organomegaly or masses felt. Normal bowel sounds heard. Central nervous system: Alert and oriented. No focal neurological deficits. Extremities: right bka Skin: No rashes, lesions or ulcers Psychiatry: Judgement and insight appear normal. Mood & affect appropriate.

## 2023-09-19 NOTE — Progress Notes (Addendum)
CSW received consult for patient. CSW spoke with patient at bedside. Patient reports he comes from home with friend. Patient reports he has [employer] questions.CSW provided contact number to medicare for patient. Patient requested clothes from social work Control and instrumentation engineer. CSW provided patient with shirt and shorts from social work Control and instrumentation engineer. Patient accepted.Patient had question for CM about motorized wheelchair. CSW informed patient that CM will follow up with him in regards to his question. All questions answered. No further questions reported at this time.

## 2023-09-19 NOTE — Progress Notes (Signed)
Mobility Specialist Progress Note:   09/19/23 1500  Mobility  Activity Ambulated with assistance in hallway  Level of Assistance Contact guard assist, steadying assist  Assistive Device Cane  Distance Ambulated (ft) 400 ft  Activity Response Tolerated well  Mobility Referral Yes  $Mobility charge 1 Mobility  Mobility Specialist Start Time (ACUTE ONLY) 1448  Mobility Specialist Stop Time (ACUTE ONLY) 1503  Mobility Specialist Time Calculation (min) (ACUTE ONLY) 15 min    Pre Mobility: 95 HR During Mobility: 99 HR Post Mobility:  93 HR  Received pt in bed having no complaints and agreeable to mobility. Pt was asymptomatic throughout ambulation and returned to room w/o fault. Left on EOB w/ call bell in reach and all needs met.   Curtis Hodge Mobility Specialist Please contact via Special educational needs teacher or Rehab office at [phone]

## 2023-09-19 NOTE — Progress Notes (Signed)
PROGRESS NOTE    Curtis Hodge  WUJ:[id number] DOB: Feb 23, 1966 DOA: 09/17/2023 PCP: Hoy Register, MD     Brief Narrative:  57 year old with history of HIV not on any antiretrovirals, osteoarthritis status post right BKA, septic right knee in July 2024, schizophrenia was sitting in a wheelchair when he started vomiting and apparently passed out.  He has been reporting of pain and discomfort around his right stump for the past 2 weeks.  Came to the ER 10 days ago, x-rays were negative and he was discharged with pain medications.  Today chest x-ray reveals concerns of right middle lobe pneumonia along with fever.  MRI of his right stump revealed possible osteomyelitis changes, orthopedic consulted.  Recommend monitoring for now, primary orthopedic Dr. Lajoyce Corners will return on Monday and evaluate.  If patient is to be discharged, they will arrange for outpatient follow-up.     Assessment & Plan:  Principal Problem:   Pneumonia Active Problems:   Diarrhea   HIV disease (HCC)   Substance abuse (HCC)   Cocaine abuse (HCC)   Right middle lobe pneumonia Possible right stump cellulitis - Off-and-on struggling with right lower extremity/stump infection.  There is also concerns of right middle lobe pneumonia.  MRI of right stump concerning for osteo, Dr Duda/Dr. Magnus Ivan consulted from orthopedic.  Recommend medical management for now.  Dr. Lajoyce Corners will evaluate the patient on Monday if patient is still here otherwise outpatient follow-up.  ID recommending doxycycline and Augmentin for total of 2 weeks.  EOT 11/13   History of HIV/AIDS -Unfortunately he has been noncompliant with his medications.  Now started on Biktarvy and Bactrim   History of osteoarthritis status post right BKA -Supportive care as above   Polysubstance abuse - Counseled   Schizophrenia/depression -Does not appear to be any home medications   Hyperlipidemia - Statin   DVT prophylaxis: Lovenox Code Status: Confirmed  DNR by admitting provider Family Communication:   Status is: Inpatient Remains inpatient appropriate because: Ongoing evaluation for sepsis       Subjective: Doing ok no complaints.      Examination:   General exam: Appears calm and comfortable  Respiratory system: mild basilar rhonchi Cardiovascular system: S1 & S2 heard, RRR. No JVD, murmurs, rubs, gallops or clicks. No pedal edema. Gastrointestinal system: Abdomen is nondistended, soft and nontender. No organomegaly or masses felt. Normal bowel sounds heard. Central nervous system: Alert and oriented. No focal neurological deficits. Extremities: right bka Skin: No rashes, lesions or ulcers Psychiatry: Judgement and insight appear normal. Mood & affect appropriate.           Diet Orders (From admission, onward)     Start     Ordered   09/17/23 2313  Diet regular Room service appropriate? Yes; Fluid consistency: Thin  Diet effective now       Question Answer Comment  Room service appropriate? Yes   Fluid consistency: Thin      09/17/23 2313            Objective: Vitals:   09/18/23 1219 09/18/23 1732 09/18/23 2008 09/19/23 0249  BP: 115/81 133/76 133/76 115/86  Pulse: 76 81 90 90  Resp: 13 16 18 18   Temp: 98.1 F (36.7 C) 98.6 F (37 C) 98.7 F (37.1 C) 98.6 F (37 C)  TempSrc: Oral Oral Oral Oral  SpO2: 93% 96% 92% 100%  Weight:      Height:        Intake/Output Summary (Last 24 hours) at 09/19/2023  1256 Last data filed at 09/19/2023 0635 Gross per 24 hour  Intake 600 ml  Output 300 ml  Net 300 ml   Filed Weights   09/17/23 2320 09/18/23 0211  Weight: 77.1 kg 67 kg    Scheduled Meds:  amoxicillin-clavulanate  1 tablet Oral Q12H   atorvastatin  40 mg Oral Daily   bictegravir-emtricitabine-tenofovir AF  1 tablet Oral Daily   doxycycline  100 mg Oral Q12H   enoxaparin (LOVENOX) injection  40 mg Subcutaneous Q24H   gabapentin  300 mg Oral TID   pantoprazole  40 mg Oral Daily    sulfamethoxazole-trimethoprim  1 tablet Oral Daily   Continuous Infusions:  Nutritional status     Body mass index is 21.19 kg/m.  Data Reviewed:   CBC: Recent Labs  Lab 09/17/23 1749 09/18/23 0535 09/19/23 0624  WBC 7.4 5.6 4.9  HGB 12.4* 12.1* 11.7*  HCT 37.2* 35.8* 35.3*  MCV 97.1 93.5 95.7  PLT 158 133* 159   Basic Metabolic Panel: Recent Labs  Lab 09/17/23 1749 09/18/23 0535 09/19/23 0624  NA 133* 138 136  K 3.5 3.6 3.4*  CL 99 101 106  CO2 24 25 24   GLUCOSE 99 87 107*  BUN 13 12 7   CREATININE 1.22 0.94 1.03  CALCIUM 8.5* 8.3* 7.9*  MG  --   --  2.0  PHOS  --   --  2.2*   GFR: Estimated Creatinine Clearance: 75.9 mL/min (by C-G formula based on SCr of 1.03 mg/dL). Liver Function Tests: Recent Labs  Lab 09/19/23 0624  AST 37  ALT 37  ALKPHOS 70  BILITOT 0.4  PROT 6.6  ALBUMIN 2.3*   No results for input(s): "LIPASE", "AMYLASE" in the last 168 hours. No results for input(s): "AMMONIA" in the last 168 hours. Coagulation Profile: No results for input(s): "INR", "PROTIME" in the last 168 hours. Cardiac Enzymes: No results for input(s): "CKTOTAL", "CKMB", "CKMBINDEX", "TROPONINI" in the last 168 hours. BNP (last 3 results) No results for input(s): "PROBNP" in the last 8760 hours. HbA1C: No results for input(s): "HGBA1C" in the last 72 hours. CBG: No results for input(s): "GLUCAP" in the last 168 hours. Lipid Profile: No results for input(s): "CHOL", "HDL", "LDLCALC", "TRIG", "CHOLHDL", "LDLDIRECT" in the last 72 hours. Thyroid Function Tests: No results for input(s): "TSH", "T4TOTAL", "FREET4", "T3FREE", "THYROIDAB" in the last 72 hours. Anemia Panel: No results for input(s): "VITAMINB12", "FOLATE", "FERRITIN", "TIBC", "IRON", "RETICCTPCT" in the last 72 hours. Sepsis Labs: Recent Labs  Lab 09/18/23 1048  PROCALCITON 0.50    Recent Results (from the past 240 hour(s))  Blood culture (routine x 2)     Status: None (Preliminary result)    Collection Time: 09/17/23  9:23 PM   Specimen: BLOOD  Result Value Ref Range Status   Specimen Description BLOOD RIGHT ANTECUBITAL  Final   Special Requests   Final    BOTTLES DRAWN AEROBIC AND ANAEROBIC Blood Culture adequate volume   Culture   Final    NO GROWTH 2 DAYS Performed at [facility], [address] [place], [place] [address]    Report Status PENDING  Incomplete  Blood culture (routine x 2)     Status: None (Preliminary result)   Collection Time: 09/17/23  9:23 PM   Specimen: BLOOD  Result Value Ref Range Status   Specimen Description BLOOD LEFT ANTECUBITAL  Final   Special Requests   Final    BOTTLES DRAWN AEROBIC AND ANAEROBIC Blood Culture adequate volume  Culture   Final    NO GROWTH 2 DAYS Performed at [facility], [address] [place], [place] [address]    Report Status PENDING  Incomplete  Resp panel by RT-PCR (RSV, Flu A&B, Covid) Anterior Nasal Swab     Status: None   Collection Time: 09/18/23  2:28 AM   Specimen: Anterior Nasal Swab  Result Value Ref Range Status   SARS Coronavirus 2 by RT PCR NEGATIVE NEGATIVE Final   Influenza A by PCR NEGATIVE NEGATIVE Final   Influenza B by PCR NEGATIVE NEGATIVE Final    Comment: (NOTE) The Xpert Xpress SARS-CoV-2/FLU/RSV plus assay is intended as an aid in the diagnosis of influenza from Nasopharyngeal swab specimens and should not be used as a sole basis for treatment. Nasal washings and aspirates are unacceptable for Xpert Xpress SARS-CoV-2/FLU/RSV testing.  Fact Sheet for Patients: BloggerCourse.com  Fact Sheet for Healthcare Providers: SeriousBroker.it  This test is not yet approved or cleared by the [place] FDA and has been authorized for detection and/or diagnosis of SARS-CoV-2 by FDA under an Emergency Use Authorization (EUA). This EUA will remain in effect (meaning this test can be used) for the duration of the COVID-19  declaration under Section 564(b)(1) of the Act, 21 U.S.C. section 360bbb-3(b)(1), unless the authorization is terminated or revoked.     Resp Syncytial Virus by PCR NEGATIVE NEGATIVE Final    Comment: (NOTE) Fact Sheet for Patients: BloggerCourse.com  Fact Sheet for Healthcare Providers: SeriousBroker.it  This test is not yet approved or cleared by the [place] FDA and has been authorized for detection and/or diagnosis of SARS-CoV-2 by FDA under an Emergency Use Authorization (EUA). This EUA will remain in effect (meaning this test can be used) for the duration of the COVID-19 declaration under Section 564(b)(1) of the Act, 21 U.S.C. section 360bbb-3(b)(1), unless the authorization is terminated or revoked.  Performed at [facility], [address], [place], [place] [address]   MRSA Next Gen by PCR, Nasal     Status: None   Collection Time: 09/18/23 11:06 AM   Specimen: Nasal Mucosa; Nasal Swab  Result Value Ref Range Status   MRSA by PCR Next Gen NOT DETECTED NOT DETECTED Final    Comment: (NOTE) The GeneXpert MRSA Assay (FDA approved for NASAL specimens only), is one component of a comprehensive MRSA colonization surveillance program. It is not intended to diagnose MRSA infection nor to guide or monitor treatment for MRSA infections. Test performance is not FDA approved in patients less than 47 years old. Performed at [facility], [address], [place], [place] [address]   Culture, Respiratory w Gram Stain     Status: None (Preliminary result)   Collection Time: 09/18/23  3:04 PM   Specimen: Sputum; Respiratory  Result Value Ref Range Status   Specimen Description SPU  Final   Special Requests NONE  Final   Gram Stain   Final    ABUNDANT SQUAMOUS EPITHELIAL CELLS PRESENT MODERATE GRAM NEGATIVE RODS FEW GRAM POSITIVE COCCI FEW GRAM POSITIVE RODS RARE YEAST WITH PSEUDOHYPHAE    Culture   Final     TOO YOUNG TO READ Performed at [facility], [address] [place], [place] [address]    Report Status PENDING  Incomplete         Radiology Studies: CT CHEST W CONTRAST  Result Date: 09/18/2023 CLINICAL DATA:  Pneumonia.  Shortness of breath. EXAM: CT CHEST WITH CONTRAST TECHNIQUE:  Multidetector CT imaging of the chest was performed during intravenous contrast administration. RADIATION DOSE REDUCTION: This exam was performed according to the departmental dose-optimization program which includes automated exposure control, adjustment of the mA and/or kV according to patient size and/or use of iterative reconstruction technique. CONTRAST:  50mL OMNIPAQUE IOHEXOL 350 MG/ML SOLN COMPARISON:  Chest CT dated 11/02/2022. FINDINGS: Evaluation is limited due to streak artifact caused by patient's arms. Cardiovascular: There is no cardiomegaly or pericardial effusion. Advanced 3 vessel coronary vascular calcification. Mild atherosclerotic calcification of the thoracic aorta. No aneurysm dilatation or dissection. The origins of the great vessels of the aortic arch and the central pulmonary arteries appear patent. Mediastinum/Nodes: No hilar or mediastinal adenopathy. The esophagus and the thyroid gland are grossly unremarkable. No mediastinal fluid collection. Lungs/Pleura: Background of emphysema and large bilateral upper lobe subpleural blebs measuring up to 11 cm in the right apex. An area of consolidation in the right upper lobe with air bronchogram new since the prior CT most consistent with pneumonia. Underlying mass is not excluded. Follow-up to resolution recommended. Interval resolution of the previously seen infiltrates. No pleural effusion or pneumothorax. The central airways remain patent. Upper Abdomen: No acute abnormality.  Small left renal calculus. Musculoskeletal: Loss of subcutaneous fat. No acute osseous pathology. IMPRESSION: 1. Right upper lobe pneumonia. Follow-up to resolution  recommended. 2. Advanced coronary vascular calcification. 3. Aortic Atherosclerosis (ICD10-I70.0) and Emphysema (ICD10-J43.9). Electronically Signed   By: Elgie Collard M.D.   On: 09/18/2023 20:16   MR KNEE RIGHT W WO CONTRAST  Result Date: 09/18/2023 CLINICAL DATA:  Osteomyelitis status post right below-the-knee amputation. Septic right knee 05/2023. The patient reports over the last 2 weeks he has been having trouble with severe pain in right stump. EXAM: MRI OF THE RIGHT KNEE WITHOUT AND WITH CONTRAST; MRI OF LOWER RIGHT EXTREMITY WITHOUT AND WITH CONTRAST TECHNIQUE: Multiplanar, multisequence MR imaging of the right knee and proximal right tibia and fibula including the patient's right below-the-knee amputation stump were performed both before and after administration of intravenous contrast. CONTRAST:  7mL GADAVIST GADOBUTROL 1 MMOL/ML IV SOLN COMPARISON:  Right knee radiographs 09/15/2023 and 05/15/2023.; CT right knee 05/15/2023; MRI right knee 05/19/2023 FINDINGS: MENISCI Medial meniscus:  Intact. Lateral meniscus:  Intact. LIGAMENTS Cruciates: The ACL and PCL are intact. Collaterals: The medial collateral ligament is intact. The fibular collateral ligament, biceps femoris tendon, iliotibial band, and popliteus tendon are intact. CARTILAGE Patellofemoral: There is moderate diffuse marrow edema seen throughout the patella which is similar to 05/19/2023. The multiloculated rim enhancing likely abscess previously seen anterior to the patella and lateral patellofemoral retinaculum has resolved. There is focal high-grade partial-thickness cartilage loss within the medial trochlea, unchanged (sagittal series 16, image 20). Medial: There is mild thinning of the medial aspect of the weight-bearing medial femoral condyle cartilage with mild subchondral marrow edema. Lateral:  Intact. Joint: Tiny joint effusion, similar to 05/19/2023. There is again nonspecific mild smooth peripheral synovial enhancement. Mild  edema within the superolateral aspect of Hoffa's fat pad SOB seen with infrapatellar fat pad impingement. Popliteal Fossa:  No Baker's cyst. Extensor Mechanism:  Intact quadriceps tendon and patellar tendon. Bones: Postsurgical changes are seen of below-the-knee amputation. There is moderate subcutaneous fat edema and swelling within the distal amputation stump subcutaneous fat extending anteriorly. No walled-off fluid collection is seen to indicate an abscess. There is abnormal increased T2 signal within the distal tibial postsurgical marrow measuring up to approximately 1.7 cm in craniocaudal dimension at the distal  lateral aspect (coronal series 3, image 13) concerning for acute osteomyelitis. Increased T2 signal also extends throughout the cortices of the posterior and medial greater than lateral aspects of the tibial stump, appearing to track along nutrient foramina within the cortices. This measures up to approximately 8.8 cm in craniocaudal dimension at the medial cortex (coronal series 13, image 13). There is very thin likely subcortical marrow edema also extending more superiorly along the medial and lateral tibia through the proximal metadiaphysis (coronal series 13, image 14). No significant marrow edema is seen at the distal tip of the fibular stump, however there is similar increased T2 signal within distal fibular cortex. This may be secondary to the vasculature, however is suspicious for osteomyelitis. Mild serpiginous decreased T1 and increased T2 signal within the central marrow of the proximal tibial diaphysis (sagittal series 13, image 14 and coronal series 12, image 14) is suggestive of an early bone infarct. Other: None. IMPRESSION: 1. Postsurgical changes of below-the-knee amputation. There is abnormal increased T2 signal within the distal tibial postsurgical marrow concerning for acute osteomyelitis in the region measuring up to approximately 1.7 cm in craniocaudal dimension. 2. Increased T2  signal also extends throughout the cortices of the posterior and medial greater than lateral aspects of the tibial stump. This remains suspicious for acute osteomyelitis involving the cortices, extending up to approximately 8.8 cm in craniocaudal length within the medial cortex. Additional very thin likely subcortical marrow edema also extending more superiorly along the medial and lateral tibia through the proximal tibial metadiaphysis. 3. No significant marrow edema is seen at the distal tip of the fibular stump, however there is similar increased T2 signal within the distal fibular cortex measuring up to 6 cm in craniocaudal length. While this may be secondary to signal from cortical vasculature, this increased T2 signal is not seen within the distal femoral cortex, and this remains concerning for osteomyelitis. 4. Moderate diffuse marrow edema throughout the patella is similar to 05/19/2023. The multiloculated rim enhancing likely abscess previously seen anterior to the patella and lateral patellofemoral retinaculum has resolved. The marrow edema may be reactive, however it is difficult to exclude osteomyelitis. 5. Tiny joint effusion, similar to 05/19/2023. There is again nonspecific mild smooth peripheral synovial enhancement. This may be reactive. Electronically Signed   By: Neita Garnet M.D.   On: 09/18/2023 10:38   MR TIBIA FIBULA RIGHT W WO CONTRAST  Result Date: 09/18/2023 CLINICAL DATA:  Osteomyelitis status post right below-the-knee amputation. Septic right knee 05/2023. The patient reports over the last 2 weeks he has been having trouble with severe pain in right stump. EXAM: MRI OF THE RIGHT KNEE WITHOUT AND WITH CONTRAST; MRI OF LOWER RIGHT EXTREMITY WITHOUT AND WITH CONTRAST TECHNIQUE: Multiplanar, multisequence MR imaging of the right knee and proximal right tibia and fibula including the patient's right below-the-knee amputation stump were performed both before and after administration of  intravenous contrast. CONTRAST:  7mL GADAVIST GADOBUTROL 1 MMOL/ML IV SOLN COMPARISON:  Right knee radiographs 09/15/2023 and 05/15/2023.; CT right knee 05/15/2023; MRI right knee 05/19/2023 FINDINGS: MENISCI Medial meniscus:  Intact. Lateral meniscus:  Intact. LIGAMENTS Cruciates: The ACL and PCL are intact. Collaterals: The medial collateral ligament is intact. The fibular collateral ligament, biceps femoris tendon, iliotibial band, and popliteus tendon are intact. CARTILAGE Patellofemoral: There is moderate diffuse marrow edema seen throughout the patella which is similar to 05/19/2023. The multiloculated rim enhancing likely abscess previously seen anterior to the patella and lateral patellofemoral retinaculum has resolved. There  is focal high-grade partial-thickness cartilage loss within the medial trochlea, unchanged (sagittal series 16, image 20). Medial: There is mild thinning of the medial aspect of the weight-bearing medial femoral condyle cartilage with mild subchondral marrow edema. Lateral:  Intact. Joint: Tiny joint effusion, similar to 05/19/2023. There is again nonspecific mild smooth peripheral synovial enhancement. Mild edema within the superolateral aspect of Hoffa's fat pad SOB seen with infrapatellar fat pad impingement. Popliteal Fossa:  No Baker's cyst. Extensor Mechanism:  Intact quadriceps tendon and patellar tendon. Bones: Postsurgical changes are seen of below-the-knee amputation. There is moderate subcutaneous fat edema and swelling within the distal amputation stump subcutaneous fat extending anteriorly. No walled-off fluid collection is seen to indicate an abscess. There is abnormal increased T2 signal within the distal tibial postsurgical marrow measuring up to approximately 1.7 cm in craniocaudal dimension at the distal lateral aspect (coronal series 3, image 13) concerning for acute osteomyelitis. Increased T2 signal also extends throughout the cortices of the posterior and medial  greater than lateral aspects of the tibial stump, appearing to track along nutrient foramina within the cortices. This measures up to approximately 8.8 cm in craniocaudal dimension at the medial cortex (coronal series 13, image 13). There is very thin likely subcortical marrow edema also extending more superiorly along the medial and lateral tibia through the proximal metadiaphysis (coronal series 13, image 14). No significant marrow edema is seen at the distal tip of the fibular stump, however there is similar increased T2 signal within distal fibular cortex. This may be secondary to the vasculature, however is suspicious for osteomyelitis. Mild serpiginous decreased T1 and increased T2 signal within the central marrow of the proximal tibial diaphysis (sagittal series 13, image 14 and coronal series 12, image 14) is suggestive of an early bone infarct. Other: None. IMPRESSION: 1. Postsurgical changes of below-the-knee amputation. There is abnormal increased T2 signal within the distal tibial postsurgical marrow concerning for acute osteomyelitis in the region measuring up to approximately 1.7 cm in craniocaudal dimension. 2. Increased T2 signal also extends throughout the cortices of the posterior and medial greater than lateral aspects of the tibial stump. This remains suspicious for acute osteomyelitis involving the cortices, extending up to approximately 8.8 cm in craniocaudal length within the medial cortex. Additional very thin likely subcortical marrow edema also extending more superiorly along the medial and lateral tibia through the proximal tibial metadiaphysis. 3. No significant marrow edema is seen at the distal tip of the fibular stump, however there is similar increased T2 signal within the distal fibular cortex measuring up to 6 cm in craniocaudal length. While this may be secondary to signal from cortical vasculature, this increased T2 signal is not seen within the distal femoral cortex, and this  remains concerning for osteomyelitis. 4. Moderate diffuse marrow edema throughout the patella is similar to 05/19/2023. The multiloculated rim enhancing likely abscess previously seen anterior to the patella and lateral patellofemoral retinaculum has resolved. The marrow edema may be reactive, however it is difficult to exclude osteomyelitis. 5. Tiny joint effusion, similar to 05/19/2023. There is again nonspecific mild smooth peripheral synovial enhancement. This may be reactive. Electronically Signed   By: Neita Garnet M.D.   On: 09/18/2023 10:38   DG Chest 2 View  Result Date: 09/17/2023 CLINICAL DATA:  COPD EXAM: CHEST - 2 VIEW COMPARISON:  05/15/2023 FINDINGS: Heart size and pulmonary vascularity are normal. Severe emphysematous changes throughout the lungs with large bullous changes in the right upper lung. Central interstitial changes with  peribronchial thickening suggesting chronic bronchitis. Superimposed area of consolidation in the anterior right upper lung, new since prior study. This likely represents focal pneumonia. Follow-up to resolution is recommended to exclude underlying focal lesion. No pleural effusions. No pneumothorax. Mediastinal contours appear intact. IMPRESSION: 1. Focal consolidation in the anterior right upper lung, new since prior study. This is likely pneumonia but follow-up to resolution is recommended to exclude underlying focal lesion. 2. Severe emphysematous changes and bronchitic changes in the lungs similar to prior study. Electronically Signed   By: Burman Nieves M.D.   On: 09/17/2023 19:42           LOS: 2 days   Time spent= 35 mins    Miguel Rota, MD [facility]  If 7PM-7AM, please contact night-coverage  09/19/2023, 12:56 PM

## 2023-09-20 DIAGNOSIS — B59 Pneumocystosis: Secondary | ICD-10-CM | POA: Diagnosis not present

## 2023-09-20 LAB — HEPATITIS B DNA, ULTRAQUANTITATIVE, PCR

## 2023-09-20 LAB — CBC
HCT: 33.8 % — ABNORMAL LOW (ref 39.0–52.0)
Hemoglobin: 11.4 g/dL — ABNORMAL LOW (ref 13.0–17.0)
MCH: 32.2 pg (ref 26.0–34.0)
MCHC: 33.7 g/dL (ref 30.0–36.0)
MCV: 95.5 fL (ref 80.0–100.0)
Platelets: 161 10*3/uL (ref 150–400)
RBC: 3.54 MIL/uL — ABNORMAL LOW (ref 4.22–5.81)
RDW: 11.8 % (ref 11.5–15.5)
WBC: 4 10*3/uL (ref 4.0–10.5)
nRBC: 0 % (ref 0.0–0.2)

## 2023-09-20 LAB — BASIC METABOLIC PANEL
Anion gap: 8 (ref 5–15)
BUN: 7 mg/dL (ref 6–20)
CO2: 24 mmol/L (ref 22–32)
Calcium: 8.1 mg/dL — ABNORMAL LOW (ref 8.9–10.3)
Chloride: 103 mmol/L (ref 98–111)
Creatinine, Ser: 0.99 mg/dL (ref 0.61–1.24)
GFR, Estimated: >60 mL/min (ref >60)
Glucose, Bld: 126 mg/dL — ABNORMAL HIGH (ref 70–99)
Potassium: 4 mmol/L (ref 3.5–5.1)
Sodium: 135 mmol/L (ref 135–145)

## 2023-09-20 LAB — MAGNESIUM: Magnesium: 2 mg/dL (ref 1.7–2.4)

## 2023-09-20 LAB — HBV REAL-TIME PCR, QUANT
HBV AS IU/ML: 44100000 [IU]/mL
LOG10 HBV AS IU/ML: 7.644 {Log}

## 2023-09-20 NOTE — TOC Progression Note (Signed)
Transition of Care [facility]) - Progression Note    Patient Details  Name: Curtis Hodge MRN: [id number] Date of Birth: 1966-10-17  Transition of Care [facility]) CM/SW Contact  Ronny Bacon, RN Phone Number: 09/20/2023, 12:30 PM  Clinical Narrative:   Secure message received from physical therapy regarding patient request for outpatient physical therapy referral due to having too many dogs at home for [facility] PT. Outpatient physical therapy referral # [id number] placed for [facility]. Contact information placed on AVS.         Expected Discharge Plan and Services                                               Social Determinants of Health (SDOH) Interventions SDOH Screenings   Food Insecurity: No Food Insecurity (09/17/2023)  Housing: Patient Declined (09/17/2023)  Transportation Needs: No Transportation Needs (09/17/2023)  Utilities: Not At Risk (09/17/2023)  Alcohol Screen: Low Risk  (10/25/2017)  Depression (PHQ2-9): High Risk (02/26/2023)  Tobacco Use: High Risk (09/17/2023)    Readmission Risk Interventions     No data to display

## 2023-09-20 NOTE — Progress Notes (Signed)
Mobility Specialist Progress Note:    09/20/23 1333  Mobility  Activity Ambulated with assistance in hallway  Level of Assistance Contact guard assist, steadying assist  Assistive Device Cane  Distance Ambulated (ft) 400 ft  Activity Response Tolerated well  Mobility Referral Yes  $Mobility charge 1 Mobility  Mobility Specialist Start Time (ACUTE ONLY) 1210  Mobility Specialist Stop Time (ACUTE ONLY) 1225  Mobility Specialist Time Calculation (min) (ACUTE ONLY) 15 min   Received pt in chair having no complaints and agreeable to mobility. Pt was asymptomatic throughout ambulation. Had x1 momentary LOB but self corrected. Returned to room w/o fault. Left in chair w/ call bell in reach and all needs met.   Thompson Grayer Mobility Specialist  Please contact vis Secure Chat or  Rehab Office [phone]

## 2023-09-20 NOTE — Progress Notes (Signed)
PROGRESS NOTE    Curtis Hodge  WUJ:[id number] DOB: 1966-10-22 DOA: 09/17/2023 PCP: Hoy Register, MD     Brief Narrative:  57 year old with history of HIV not on any antiretrovirals, osteoarthritis status post right BKA, septic right knee in July 2024, schizophrenia was sitting in a wheelchair when he started vomiting and apparently passed out.  He has been reporting of pain and discomfort around his right stump for the past 2 weeks.  Came to the ER 10 days ago, x-rays were negative and he was discharged with pain medications.  Today chest x-ray reveals concerns of right middle lobe pneumonia along with fever.  MRI of his right stump revealed possible osteomyelitis changes, orthopedic consulted.  Recommend monitoring for now, primary orthopedic Dr. Lajoyce Corners will return on Monday and evaluate.  If patient is to be discharged, they will arrange for outpatient follow-up.  ID recommending     Assessment & Plan:  Principal Problem:   Pneumonia Active Problems:   Diarrhea   HIV disease (HCC)   Substance abuse (HCC)   Cocaine abuse (HCC)   Right middle lobe pneumonia Possible right stump cellulitis - Off-and-on struggling with right lower extremity/stump infection.  There is also concerns of right middle lobe pneumonia.  MRI of right stump concerning for osteo, seen by Dr. Magnus Ivan from orthopedic, no urgent intervention.  If patient is here until Monday, Dr. Lajoyce Corners will see the patient here , if not then outpatient follow-up.  ID recommending doxycycline and Augmentin.  Follow culture data.  Multiple infection workup/cultures have been sent.   History of HIV/AIDS -Unfortunately he has been noncompliant with his medications.  Now started on Biktarvy and Bactrim.  Has outpatient ID appointment as well.  Chronic Hep B -Hep C - neg. Hep B Ag +, Hep B Ab - ng   History of osteoarthritis status post right BKA -Supportive care as above   Polysubstance abuse - Counseled    Schizophrenia/depression -Does not appear to be any home medications   Hyperlipidemia - Statin   DVT prophylaxis: Lovenox Code Status: Confirmed DNR by admitting provider Family Communication:   Status is: Inpatient Dr Lajoyce Corners to evaluate ptn in the hospital prior to dc.  Ptn with transport issues.        Subjective: Doing ok no complaints.   Prefer to stay in the hospital until Monday and seen by Dr Lajoyce Corners.    Examination:   General exam: Appears calm and comfortable  Respiratory system: mild basilar rhonchi Cardiovascular system: S1 & S2 heard, RRR. No JVD, murmurs, rubs, gallops or clicks. No pedal edema. Gastrointestinal system: Abdomen is nondistended, soft and nontender. No organomegaly or masses felt. Normal bowel sounds heard. Central nervous system: Alert and oriented. No focal neurological deficits. Extremities: right bka Skin: No rashes, lesions or ulcers Psychiatry: Judgement and insight appear normal. Mood & affect appropriate.               Diet Orders (From admission, onward)     Start     Ordered   09/17/23 2313  Diet regular Room service appropriate? Yes; Fluid consistency: Thin  Diet effective now       Question Answer Comment  Room service appropriate? Yes   Fluid consistency: Thin      09/17/23 2313            Objective: Vitals:   09/19/23 0249 09/19/23 1339 09/19/23 1942 09/20/23 0408  BP: 115/86 125/74 136/78 123/62  Pulse: 90 90 88 81  Resp:  18 18 20 20   Temp: 98.6 F (37 C) 98.4 F (36.9 C) 98.7 F (37.1 C) 98.6 F (37 C)  TempSrc: Oral Oral Oral Oral  SpO2: 100% 100% 100% 100%  Weight:      Height:        Intake/Output Summary (Last 24 hours) at 09/20/2023 1057 Last data filed at 09/20/2023 0716 Gross per 24 hour  Intake --  Output 900 ml  Net -900 ml   Filed Weights   09/17/23 2320 09/18/23 0211  Weight: 77.1 kg 67 kg    Scheduled Meds:  amoxicillin-clavulanate  1 tablet Oral Q12H   atorvastatin  40 mg Oral  Daily   bictegravir-emtricitabine-tenofovir AF  1 tablet Oral Daily   doxycycline  100 mg Oral Q12H   enoxaparin (LOVENOX) injection  40 mg Subcutaneous Q24H   gabapentin  300 mg Oral TID   pantoprazole  40 mg Oral Daily   sulfamethoxazole-trimethoprim  1 tablet Oral Daily   Continuous Infusions:  Nutritional status     Body mass index is 21.19 kg/m.  Data Reviewed:   CBC: Recent Labs  Lab 09/17/23 1749 09/18/23 0535 09/19/23 0624 09/20/23 0348  WBC 7.4 5.6 4.9 4.0  HGB 12.4* 12.1* 11.7* 11.4*  HCT 37.2* 35.8* 35.3* 33.8*  MCV 97.1 93.5 95.7 95.5  PLT 158 133* 159 161   Basic Metabolic Panel: Recent Labs  Lab 09/17/23 1749 09/18/23 0535 09/19/23 0624 09/20/23 0348  NA 133* 138 136 135  K 3.5 3.6 3.4* 4.0  CL 99 101 106 103  CO2 24 25 24 24   GLUCOSE 99 87 107* 126*  BUN 13 12 7 7   CREATININE 1.22 0.94 1.03 0.99  CALCIUM 8.5* 8.3* 7.9* 8.1*  MG  --   --  2.0 2.0  PHOS  --   --  2.2*  --    GFR: Estimated Creatinine Clearance: 79 mL/min (by C-G formula based on SCr of 0.99 mg/dL). Liver Function Tests: Recent Labs  Lab 09/19/23 0624  AST 37  ALT 37  ALKPHOS 70  BILITOT 0.4  PROT 6.6  ALBUMIN 2.3*   No results for input(s): "LIPASE", "AMYLASE" in the last 168 hours. No results for input(s): "AMMONIA" in the last 168 hours. Coagulation Profile: No results for input(s): "INR", "PROTIME" in the last 168 hours. Cardiac Enzymes: No results for input(s): "CKTOTAL", "CKMB", "CKMBINDEX", "TROPONINI" in the last 168 hours. BNP (last 3 results) No results for input(s): "PROBNP" in the last 8760 hours. HbA1C: No results for input(s): "HGBA1C" in the last 72 hours. CBG: No results for input(s): "GLUCAP" in the last 168 hours. Lipid Profile: No results for input(s): "CHOL", "HDL", "LDLCALC", "TRIG", "CHOLHDL", "LDLDIRECT" in the last 72 hours. Thyroid Function Tests: No results for input(s): "TSH", "T4TOTAL", "FREET4", "T3FREE", "THYROIDAB" in the last 72  hours. Anemia Panel: No results for input(s): "VITAMINB12", "FOLATE", "FERRITIN", "TIBC", "IRON", "RETICCTPCT" in the last 72 hours. Sepsis Labs: Recent Labs  Lab 09/18/23 1048  PROCALCITON 0.50    Recent Results (from the past 240 hour(s))  Blood culture (routine x 2)     Status: None (Preliminary result)   Collection Time: 09/17/23  9:23 PM   Specimen: BLOOD  Result Value Ref Range Status   Specimen Description BLOOD RIGHT ANTECUBITAL  Final   Special Requests   Final    BOTTLES DRAWN AEROBIC AND ANAEROBIC Blood Culture adequate volume   Culture   Final    NO GROWTH 3 DAYS Performed at [facility]  Lab, [address], [place], [place] [address]    Report Status PENDING  Incomplete  Blood culture (routine x 2)     Status: None (Preliminary result)   Collection Time: 09/17/23  9:23 PM   Specimen: BLOOD  Result Value Ref Range Status   Specimen Description BLOOD LEFT ANTECUBITAL  Final   Special Requests   Final    BOTTLES DRAWN AEROBIC AND ANAEROBIC Blood Culture adequate volume   Culture   Final    NO GROWTH 3 DAYS Performed at [facility] Lab, [address] [place], [place] [address]    Report Status PENDING  Incomplete  Resp panel by RT-PCR (RSV, Flu A&B, Covid) Anterior Nasal Swab     Status: None   Collection Time: 09/18/23  2:28 AM   Specimen: Anterior Nasal Swab  Result Value Ref Range Status   SARS Coronavirus 2 by RT PCR NEGATIVE NEGATIVE Final   Influenza A by PCR NEGATIVE NEGATIVE Final   Influenza B by PCR NEGATIVE NEGATIVE Final    Comment: (NOTE) The Xpert Xpress SARS-CoV-2/FLU/RSV plus assay is intended as an aid in the diagnosis of influenza from Nasopharyngeal swab specimens and should not be used as a sole basis for treatment. Nasal washings and aspirates are unacceptable for Xpert Xpress SARS-CoV-2/FLU/RSV testing.  Fact Sheet for Patients: BloggerCourse.com  Fact Sheet for Healthcare  Providers: SeriousBroker.it  This test is not yet approved or cleared by the [place] FDA and has been authorized for detection and/or diagnosis of SARS-CoV-2 by FDA under an Emergency Use Authorization (EUA). This EUA will remain in effect (meaning this test can be used) for the duration of the COVID-19 declaration under Section 564(b)(1) of the Act, 21 U.S.C. section 360bbb-3(b)(1), unless the authorization is terminated or revoked.     Resp Syncytial Virus by PCR NEGATIVE NEGATIVE Final    Comment: (NOTE) Fact Sheet for Patients: BloggerCourse.com  Fact Sheet for Healthcare Providers: SeriousBroker.it  This test is not yet approved or cleared by the [place] FDA and has been authorized for detection and/or diagnosis of SARS-CoV-2 by FDA under an Emergency Use Authorization (EUA). This EUA will remain in effect (meaning this test can be used) for the duration of the COVID-19 declaration under Section 564(b)(1) of the Act, 21 U.S.C. section 360bbb-3(b)(1), unless the authorization is terminated or revoked.  Performed at [facility] Lab, [address], [place], [place] [address]   MRSA Next Gen by PCR, Nasal     Status: None   Collection Time: 09/18/23 11:06 AM   Specimen: Nasal Mucosa; Nasal Swab  Result Value Ref Range Status   MRSA by PCR Next Gen NOT DETECTED NOT DETECTED Final    Comment: (NOTE) The GeneXpert MRSA Assay (FDA approved for NASAL specimens only), is one component of a comprehensive MRSA colonization surveillance program. It is not intended to diagnose MRSA infection nor to guide or monitor treatment for MRSA infections. Test performance is not FDA approved in patients less than 64 years old. Performed at [facility] Lab, [address], [place], [place] [address]   Culture, Respiratory w Gram Stain     Status: None (Preliminary result)   Collection Time: 09/18/23   3:04 PM   Specimen: Sputum; Respiratory  Result Value Ref Range Status   Specimen Description SPU  Final   Special Requests NONE  Final   Gram Stain   Final    ABUNDANT SQUAMOUS EPITHELIAL CELLS PRESENT MODERATE GRAM NEGATIVE RODS FEW  GRAM POSITIVE COCCI FEW GRAM POSITIVE RODS RARE YEAST WITH PSEUDOHYPHAE    Culture   Final    TOO YOUNG TO READ Performed at [facility] Lab, [address], [place], [place] [address]    Report Status PENDING  Incomplete         Radiology Studies: CT CHEST W CONTRAST  Result Date: 09/18/2023 CLINICAL DATA:  Pneumonia.  Shortness of breath. EXAM: CT CHEST WITH CONTRAST TECHNIQUE: Multidetector CT imaging of the chest was performed during intravenous contrast administration. RADIATION DOSE REDUCTION: This exam was performed according to the departmental dose-optimization program which includes automated exposure control, adjustment of the mA and/or kV according to patient size and/or use of iterative reconstruction technique. CONTRAST:  50mL OMNIPAQUE IOHEXOL 350 MG/ML SOLN COMPARISON:  Chest CT dated 11/02/2022. FINDINGS: Evaluation is limited due to streak artifact caused by patient's arms. Cardiovascular: There is no cardiomegaly or pericardial effusion. Advanced 3 vessel coronary vascular calcification. Mild atherosclerotic calcification of the thoracic aorta. No aneurysm dilatation or dissection. The origins of the great vessels of the aortic arch and the central pulmonary arteries appear patent. Mediastinum/Nodes: No hilar or mediastinal adenopathy. The esophagus and the thyroid gland are grossly unremarkable. No mediastinal fluid collection. Lungs/Pleura: Background of emphysema and large bilateral upper lobe subpleural blebs measuring up to 11 cm in the right apex. An area of consolidation in the right upper lobe with air bronchogram new since the prior CT most consistent with pneumonia. Underlying mass is not excluded. Follow-up to resolution  recommended. Interval resolution of the previously seen infiltrates. No pleural effusion or pneumothorax. The central airways remain patent. Upper Abdomen: No acute abnormality.  Small left renal calculus. Musculoskeletal: Loss of subcutaneous fat. No acute osseous pathology. IMPRESSION: 1. Right upper lobe pneumonia. Follow-up to resolution recommended. 2. Advanced coronary vascular calcification. 3. Aortic Atherosclerosis (ICD10-I70.0) and Emphysema (ICD10-J43.9). Electronically Signed   By: Elgie Collard M.D.   On: 09/18/2023 20:16           LOS: 3 days   Time spent= 35 mins    Miguel Rota, MD [facility]  If 7PM-7AM, please contact night-coverage  09/20/2023, 10:57 AM

## 2023-09-20 NOTE — Evaluation (Signed)
Physical Therapy Evaluation Patient Details Name: Curtis Hodge MRN: [id number] DOB: April 23, 1966 Today's Date: 09/20/2023  History of Present Illness  57 y/o male presents to [facility] 09/17/23 after vomiting and passing out in [facility]. Pain in R stump w/ difficulty putting on prosthesis and SOB for 1 week. Chest x-ray shows R middle lobe PNA. Possible osteomyelitis w/ follow up Monday or OP follow up. PMH includes: HIV noncompliant with medications, chronic hepatitis B, COPD/emphysema, anxiety/depression, schizophrenia, R BKA.   Clinical Impression  Pt in bed upon arrival and agreeable to PT eval. Prior to admission, pt reports ambulating short distances either with SP cane and R prosthetic for R BKA or RW w/ no prosthetic. Pt reports decreased activity tolerance and mobility due to pain in R stump and feeling SOB with activity. Pt would benefit from OP PT to work towards regaining activity tolerance and use with R prosthetic. Pt had a positive experience with OP PT and is eager to improve his mobility. Pt is discussing getting a power WC with case management to help regain independence with community mobility. Pt reports multiple hills to get to appointments and would benefit from a power WC to be able to get to appointments without assistance. Recommending continued mobility specialist in the acute setting. Acute PT signing off as pt is independent with mobility.       If plan is discharge home, recommend the following: Help with stairs or ramp for entrance;Assist for transportation   Can travel by private vehicle    Yes    Equipment Recommendations None recommended by PT (tub bench)     Functional Status Assessment Patient has had a recent decline in their functional status and demonstrates the ability to make significant improvements in function in a reasonable and predictable amount of time.     Precautions / Restrictions Precautions Precautions: Fall Restrictions Weight Bearing  Restrictions: No      Mobility  Bed Mobility Overal bed mobility: Independent   Transfers Overall transfer level: Modified independent Equipment used: Straight cane, Rolling walker (2 wheels)               General transfer comment: pt able to transfer independently with SP cane and prosthetic and RW w/ no prosthetic    Ambulation/Gait Ambulation/Gait assistance: Modified independent (Device/Increase time) Gait Distance (Feet): 80 Feet (x40 w/ SP cane, x40 w/ RW) Assistive device: Rolling walker (2 wheels), Straight cane Gait Pattern/deviations: Step-to pattern, Step-through pattern       General Gait Details: hop to gait pattern w/ RW and no prosthetic, heavy UE use. Step to gait pattern w/ prosthetic and SP cane w/ standing rest breaks due to fatigue and pain      Balance Overall balance assessment: Mild deficits observed, not formally tested, Needs assistance Sitting-balance support: Feet supported, No upper extremity supported Sitting balance-Leahy Scale: Good     Standing balance support: Single extremity supported, During functional activity, Reliant on assistive device for balance Standing balance-Leahy Scale: Poor Standing balance comment: able to stand w/ no AD, however needs UE support to ambulate          Pertinent Vitals/Pain Pain Assessment Pain Assessment: Faces Faces Pain Scale: Hurts even more Pain Location: R LE stump, pressure in chest (pain w/ light touch and spasms) Pain Descriptors / Indicators: Aching, Discomfort, Grimacing Pain Intervention(s): Limited activity within patient's tolerance, Repositioned, Monitored during session    Home Living Family/patient expects to be discharged to:: Private residence Living Arrangements: Other relatives (cousin) Available  Help at Discharge: Available PRN/intermittently Type of Home: House Home Access: Stairs to enter Entrance Stairs-Rails: Can reach both;Left;Right Entrance Stairs-Number of Steps:  5   Home Layout: One level Home Equipment: Agricultural consultant (2 wheels);Cane - single point;Crutches;Wheelchair Financial trader (4 wheels) Additional Comments: R leg prosthetic    Prior Function Prior Level of Function : Independent/Modified Independent      Mobility Comments: ambulates with prosthetic and SP cane for short distances or RW w/ no prosthetic ADLs Comments: uses wheelchair to get to bus stop, limited time using prosthesis, has service dog and typical dog at home,.     Extremity/Trunk Assessment   Upper Extremity Assessment Upper Extremity Assessment: Overall WFL for tasks assessed    Lower Extremity Assessment Lower Extremity Assessment: Overall WFL for tasks assessed    Cervical / Trunk Assessment Cervical / Trunk Assessment: Normal  Communication   Communication Communication: No apparent difficulties Cueing Techniques: Verbal cues  Cognition Arousal: Alert Behavior During Therapy: WFL for tasks assessed/performed Overall Cognitive Status: Within Functional Limits for tasks assessed         General Comments General comments (skin integrity, edema, etc.): VSS on RA, slight callus on distal end of R stump     PT Assessment All further PT needs can be met in the next venue of care (OP PT)   Pt at baseline for mobility and modified independent for all mobility   AM-PAC PT "6 Clicks" Mobility  Outcome Measure Help needed turning from your back to your side while in a flat bed without using bedrails?: None Help needed moving from lying on your back to sitting on the side of a flat bed without using bedrails?: None Help needed moving to and from a bed to a chair (including a wheelchair)?: None Help needed standing up from a chair using your arms (e.g., wheelchair or bedside chair)?: None Help needed to walk in hospital room?: None Help needed climbing 3-5 steps with a railing? : A Little 6 Click Score: 23    End of Session   Activity Tolerance: Patient  tolerated treatment well Patient left: in bed;with call bell/phone within reach Nurse Communication: Mobility status      Time: 0831-0900 PT Time Calculation (min) (ACUTE ONLY): 29 min   Charges:   PT Evaluation $PT Eval Low Complexity: 1 Low   PT General Charges $$ ACUTE PT VISIT: 1 Visit        Hilton Cork, PT, DPT Secure Chat Preferred  Rehab Office [phone]  Arturo Morton Brion Aliment 09/20/2023, 9:21 AM

## 2023-09-21 DIAGNOSIS — J189 Pneumonia, unspecified organism: Secondary | ICD-10-CM | POA: Diagnosis not present

## 2023-09-21 LAB — BASIC METABOLIC PANEL
Anion gap: 7 (ref 5–15)
BUN: 6 mg/dL (ref 6–20)
CO2: 27 mmol/L (ref 22–32)
Calcium: 8.5 mg/dL — ABNORMAL LOW (ref 8.9–10.3)
Chloride: 104 mmol/L (ref 98–111)
Creatinine, Ser: 0.96 mg/dL (ref 0.61–1.24)
GFR, Estimated: >60 mL/min (ref >60)
Glucose, Bld: 101 mg/dL — ABNORMAL HIGH (ref 70–99)
Potassium: 4.4 mmol/L (ref 3.5–5.1)
Sodium: 138 mmol/L (ref 135–145)

## 2023-09-21 LAB — CBC
HCT: 35.2 % — ABNORMAL LOW (ref 39.0–52.0)
Hemoglobin: 11.4 g/dL — ABNORMAL LOW (ref 13.0–17.0)
MCH: 30.5 pg (ref 26.0–34.0)
MCHC: 32.4 g/dL (ref 30.0–36.0)
MCV: 94.1 fL (ref 80.0–100.0)
Platelets: 179 10*3/uL (ref 150–400)
RBC: 3.74 MIL/uL — ABNORMAL LOW (ref 4.22–5.81)
RDW: 11.8 % (ref 11.5–15.5)
WBC: 3.8 10*3/uL — ABNORMAL LOW (ref 4.0–10.5)
nRBC: 0 % (ref 0.0–0.2)

## 2023-09-21 LAB — MAGNESIUM: Magnesium: 1.9 mg/dL (ref 1.7–2.4)

## 2023-09-21 MED ORDER — BACLOFEN 10 MG PO TABS
5.0000 mg | ORAL_TABLET | Freq: Three times a day (TID) | ORAL | Status: DC
  Administered 2023-09-21 – 2023-09-22 (×5): 5 mg via ORAL
  Filled 2023-09-21 (×5): qty 1

## 2023-09-21 MED ORDER — INFLUENZA VIRUS VACC SPLIT PF (FLUZONE) 0.5 ML IM SUSY
0.5000 mL | PREFILLED_SYRINGE | INTRAMUSCULAR | Status: AC
  Administered 2023-09-22: 0.5 mL via INTRAMUSCULAR
  Filled 2023-09-21: qty 0.5

## 2023-09-21 NOTE — Plan of Care (Signed)

## 2023-09-21 NOTE — Progress Notes (Signed)
PROGRESS NOTE    Curtis Hodge  JJO:[id number] DOB: [birth year] DOA: 09/17/2023 PCP: Hoy Register, MD  Chief Complaint  Patient presents with   Shortness of Breath    Brief Narrative:   57 year old with history of HIV not on any antiretrovirals, osteoarthritis status post right BKA, septic right knee in July 2024, schizophrenia was sitting in [employer] wheelchair when he started vomiting and apparently passed out.  He has been reporting of pain and discomfort around his right stump for the past 2 weeks.  Came to the ER 10 days ago, x-rays were negative and he was discharged with pain medications.  Today chest x-ray reveals concerns of right middle lobe pneumonia along with fever.  MRI of his right stump revealed possible osteomyelitis changes, orthopedic consulted.  Recommend monitoring for now, primary orthopedic Dr. Lajoyce Corners will return on Monday and evaluate.  If patient is to be discharged, they will arrange for outpatient follow-up.  ID recommending    Assessment & Plan:   Principal Problem:   Pneumonia Active Problems:   Diarrhea   HIV disease (HCC)   Substance abuse (HCC)   Cocaine abuse (HCC)  Right middle lobe pneumonia Possible right stump cellulitis - CT with RUL pneumonia (needs follow up to resolution) - 10/31 sputum cx with yeast, staph aureus  - blood cx NGTD - currently on augmentin/doxycycline per ID, appreciate assistance - MRI RLE with abnormal increased T2 signal within the distal tibial post surgical marrow concerning for acute osteomyelitis, increased T2 signal extends throughout the cortices of the posterior and medial (greater than lateral) aspects of the tibial stump (concerning for acute osteo).  Thin subcortical marro wedema extending along medial and lateral tibia through the proximal tibial metadiaphysis.  Increaesd T2 signal within distal fibular cortex concerning for osteo.  Moderate diffuse marrow edema throughout the patella.  Tiny joint effusion. - seen  by Dr. Magnus Ivan from orthopedic, no urgent intervention.  Will ask Dr. Lajoyce Corners to see 11/4.    History of HIV/AIDS -Unfortunately he has been noncompliant with his medications.  Now started on Biktarvy and Bactrim.  Has outpatient ID appointment as well.   Chronic Hep B -Hep C - neg. Hep B Ag +, Hep B Ab - ng - per ID   History of osteoarthritis status post right BKA -Supportive care as above   Polysubstance abuse - Counseled   Schizophrenia/depression -Does not appear to be any home medications   Hyperlipidemia - Statin    DVT prophylaxis: lovenox Code Status: DNR Family Communication: none Disposition:   Status is: Inpatient Remains inpatient appropriate because: need for continued inpatient care   Consultants:  ID ortho  Procedures:  none  Antimicrobials:  Anti-infectives (From admission, onward)    Start     Dose/Rate Route Frequency Ordered Stop   09/19/23 1100  vancomycin (VANCOREADY) IVPB 750 mg/150 mL  Status:  Discontinued        750 mg 150 mL/hr over 60 Minutes Intravenous 2 times daily 09/19/23 0948 09/19/23 0958   09/19/23 1100  doxycycline (VIBRA-TABS) tablet 100 mg        100 mg Oral Every 12 hours 09/19/23 0958     09/19/23 1100  amoxicillin-clavulanate (AUGMENTIN) 875-125 MG per tablet 1 tablet        1 tablet Oral Every 12 hours 09/19/23 0958     09/19/23 1045  bictegravir-emtricitabine-tenofovir AF (BIKTARVY) 50-200-25 MG per tablet 1 tablet        1 tablet Oral Daily 09/19/23 0951  09/19/23 1000  sulfamethoxazole-trimethoprim (BACTRIM DS) 800-160 MG per tablet 1 tablet        1 tablet Oral Daily 09/19/23 0720     09/19/23 0000  doxycycline (VIBRA-TABS) 100 MG tablet        100 mg Oral 2 times daily 09/19/23 1001     09/19/23 0000  amoxicillin-clavulanate (AUGMENTIN) 875-125 MG tablet        1 tablet Oral 2 times daily 09/19/23 1001     09/19/23 0000  bictegravir-emtricitabine-tenofovir AF (BIKTARVY) 50-200-25 MG TABS tablet  Status:   Discontinued       Note to Pharmacy: Patient currently has no cell phone so until he receives one may need to call [facility] to help set up delivery or pickup [phone]   1 tablet Oral Daily 09/19/23 1001 09/19/23    09/19/23 0000  sulfamethoxazole-trimethoprim (BACTRIM DS) 800-160 MG tablet        1 tablet Oral Daily 09/19/23 1001     09/19/23 0000  bictegravir-emtricitabine-tenofovir AF (BIKTARVY) 50-200-25 MG TABS tablet       Note to Pharmacy: Patient currently has no cell phone so until he receives one may need to call [facility] to help set up delivery or pickup [phone]   1 tablet Oral Daily 09/19/23 1002     09/18/23 1200  vancomycin (VANCOCIN) IVPB 1000 mg/200 mL premix  Status:  Discontinued        1,000 mg 200 mL/hr over 60 Minutes Intravenous 2 times daily 09/18/23 0134 09/19/23 0948   09/18/23 1000  ceFEPIme (MAXIPIME) 2 g in sodium chloride 0.9 % 100 mL IVPB  Status:  Discontinued        2 g 200 mL/hr over 30 Minutes Intravenous Every 12 hours 09/18/23 0130 09/19/23 0958   09/18/23 0600  ceFEPIme (MAXIPIME) 2 g in sodium chloride 0.9 % 100 mL IVPB  Status:  Discontinued        2 g 200 mL/hr over 30 Minutes Intravenous Every 8 hours 09/18/23 0127 09/18/23 0130   09/18/23 0230  sulfamethoxazole-trimethoprim (BACTRIM DS) 800-160 MG per tablet 1 tablet  Status:  Discontinued        1 tablet Oral Every 12 hours 09/18/23 0124 09/18/23 1523   09/18/23 0230  vancomycin (VANCOREADY) IVPB 1500 mg/300 mL        1,500 mg 150 mL/hr over 120 Minutes Intravenous  Once 09/18/23 0125 09/18/23 0532   09/18/23 0130  ceFEPIme (MAXIPIME) 2 g in sodium chloride 0.9 % 100 mL IVPB  Status:  Discontinued        2 g 200 mL/hr over 30 Minutes Intravenous  Once 09/18/23 0125 09/18/23 0127   09/17/23 2100  cefTRIAXone (ROCEPHIN) 2 g in sodium chloride 0.9 % 100 mL IVPB        2 g 200 mL/hr over 30 Minutes Intravenous  Once 09/17/23 2057 09/17/23 2241   09/17/23 2100  azithromycin (ZITHROMAX) 500 mg in  sodium chloride 0.9 % 250 mL IVPB        500 mg 250 mL/hr over 60 Minutes Intravenous  Once 09/17/23 2057 09/17/23 2241       Subjective: C/o spasticity in RLE   Objective: Vitals:   09/20/23 1425 09/20/23 1952 09/21/23 0426 09/21/23 0655  BP:  (!) 144/79 125/76 (!) 142/105  Pulse: 86 82 75 79  Resp: 19 18 20 15   Temp: 97.9 F (36.6 C) 98.6 F (37 C) 98 F (36.7 C) 98.4 F (36.9 C)  TempSrc: Oral Oral Oral  Oral  SpO2: 100% 92%  98%  Weight:      Height:        Intake/Output Summary (Last 24 hours) at 09/21/2023 1141 Last data filed at 09/21/2023 0900 Gross per 24 hour  Intake 240 ml  Output 1650 ml  Net -1410 ml   Filed Weights   09/17/23 2320 09/18/23 0211  Weight: 77.1 kg 67 kg    Examination:  General exam: Appears calm and comfortable  Respiratory system: unlabored Central nervous system: Alert and oriented. No focal neurological deficits. Extremities: R BKA    Data Reviewed: I have personally reviewed following labs and imaging studies  CBC: Recent Labs  Lab 09/17/23 1749 09/18/23 0535 09/19/23 0624 09/20/23 0348 09/21/23 0358  WBC 7.4 5.6 4.9 4.0 3.8*  HGB 12.4* 12.1* 11.7* 11.4* 11.4*  HCT 37.2* 35.8* 35.3* 33.8* 35.2*  MCV 97.1 93.5 95.7 95.5 94.1  PLT 158 133* 159 161 179    Basic Metabolic Panel: Recent Labs  Lab 09/17/23 1749 09/18/23 0535 09/19/23 0624 09/20/23 0348 09/21/23 0358  NA 133* 138 136 135 138  K 3.5 3.6 3.4* 4.0 4.4  CL 99 101 106 103 104  CO2 24 25 24 24 27   GLUCOSE 99 87 107* 126* 101*  BUN 13 12 7 7 6   CREATININE 1.22 0.94 1.03 0.99 0.96  CALCIUM 8.5* 8.3* 7.9* 8.1* 8.5*  MG  --   --  2.0 2.0 1.9  PHOS  --   --  2.2*  --   --     GFR: Estimated Creatinine Clearance: 81.4 mL/min (by C-G formula based on SCr of 0.96 mg/dL).  Liver Function Tests: Recent Labs  Lab 09/19/23 0624  AST 37  ALT 37  ALKPHOS 70  BILITOT 0.4  PROT 6.6  ALBUMIN 2.3*    CBG: No results for input(s): "GLUCAP" in the last  168 hours.   Recent Results (from the past 240 hour(s))  Blood culture (routine x 2)     Status: None (Preliminary result)   Collection Time: 09/17/23  9:23 PM   Specimen: BLOOD  Result Value Ref Range Status   Specimen Description BLOOD RIGHT ANTECUBITAL  Final   Special Requests   Final    BOTTLES DRAWN AEROBIC AND ANAEROBIC Blood Culture adequate volume   Culture   Final    NO GROWTH 4 DAYS Performed at [facility], [address] [place], [place] [address]    Report Status PENDING  Incomplete  Blood culture (routine x 2)     Status: None (Preliminary result)   Collection Time: 09/17/23  9:23 PM   Specimen: BLOOD  Result Value Ref Range Status   Specimen Description BLOOD LEFT ANTECUBITAL  Final   Special Requests   Final    BOTTLES DRAWN AEROBIC AND ANAEROBIC Blood Culture adequate volume   Culture   Final    NO GROWTH 4 DAYS Performed at [facility], [address] [place], [place] [address]    Report Status PENDING  Incomplete  Resp panel by RT-PCR (RSV, Flu Marites Nath&B, Covid) Anterior Nasal Swab     Status: None   Collection Time: 09/18/23  2:28 AM   Specimen: Anterior Nasal Swab  Result Value Ref Range Status   SARS Coronavirus 2 by RT PCR NEGATIVE NEGATIVE Final   Influenza Mickeal Daws by PCR NEGATIVE NEGATIVE Final   Influenza B by PCR NEGATIVE NEGATIVE Final    Comment: (NOTE) The Xpert Xpress SARS-CoV-2/FLU/RSV plus assay is  intended as an aid in the diagnosis of influenza from Nasopharyngeal swab specimens and should not be used as Glennis Montenegro sole basis for treatment. Nasal washings and aspirates are unacceptable for Xpert Xpress SARS-CoV-2/FLU/RSV testing.  Fact Sheet for Patients: BloggerCourse.com  Fact Sheet for Healthcare Providers: SeriousBroker.it  This test is not yet approved or cleared by the [place] FDA and has been authorized for detection and/or diagnosis of SARS-CoV-2 by FDA under an Emergency  Use Authorization (EUA). This EUA will remain in effect (meaning this test can be used) for the duration of the COVID-19 declaration under Section 564(b)(1) of the Act, 21 U.S.C. section 360bbb-3(b)(1), unless the authorization is terminated or revoked.     Resp Syncytial Virus by PCR NEGATIVE NEGATIVE Final    Comment: (NOTE) Fact Sheet for Patients: BloggerCourse.com  Fact Sheet for Healthcare Providers: SeriousBroker.it  This test is not yet approved or cleared by the [place] FDA and has been authorized for detection and/or diagnosis of SARS-CoV-2 by FDA under an Emergency Use Authorization (EUA). This EUA will remain in effect (meaning this test can be used) for the duration of the COVID-19 declaration under Section 564(b)(1) of the Act, 21 U.S.C. section 360bbb-3(b)(1), unless the authorization is terminated or revoked.  Performed at [facility], [address], [place], [place] [address]   MRSA Next Gen by PCR, Nasal     Status: None   Collection Time: 09/18/23 11:06 AM   Specimen: Nasal Mucosa; Nasal Swab  Result Value Ref Range Status   MRSA by PCR Next Gen NOT DETECTED NOT DETECTED Final    Comment: (NOTE) The GeneXpert MRSA Assay (FDA approved for NASAL specimens only), is one component of Keshaun Dubey comprehensive MRSA colonization surveillance program. It is not intended to diagnose MRSA infection nor to guide or monitor treatment for MRSA infections. Test performance is not FDA approved in patients less than 63 years old. Performed at [facility], [address] [place], [place] [address]   Culture, Respiratory w Gram Stain     Status: None (Preliminary result)   Collection Time: 09/18/23  3:04 PM   Specimen: Sputum; Respiratory  Result Value Ref Range Status   Specimen Description SPU  Final   Special Requests NONE  Final   Gram Stain   Final    ABUNDANT SQUAMOUS EPITHELIAL CELLS PRESENT MODERATE  GRAM NEGATIVE RODS FEW GRAM POSITIVE COCCI FEW GRAM POSITIVE RODS RARE YEAST WITH PSEUDOHYPHAE Performed at [facility], [address] [place], [place] [address]    Culture   Final    FEW STAPHYLOCOCCUS AUREUS FEW YEAST IDENTIFICATION TO FOLLOW SUSCEPTIBILITIES TO FOLLOW STAPHYLOCOCCUS AUREUS    Report Status PENDING  Incomplete         Radiology Studies: No results found.      Scheduled Meds:  amoxicillin-clavulanate  1 tablet Oral Q12H   atorvastatin  40 mg Oral Daily   baclofen  5 mg Oral TID   bictegravir-emtricitabine-tenofovir AF  1 tablet Oral Daily   doxycycline  100 mg Oral Q12H   enoxaparin (LOVENOX) injection  40 mg Subcutaneous Q24H   gabapentin  300 mg Oral TID   [START ON 09/22/2023] influenza vac split trivalent PF  0.5 mL Intramuscular Tomorrow-1000   pantoprazole  40 mg Oral Daily   sulfamethoxazole-trimethoprim  1 tablet Oral Daily   Continuous Infusions:   LOS: 4 days    Time spent: over 30 min    Lacretia Nicks, MD [facility]  To contact the attending provider between 7A-7P or the covering provider during after hours 7P-7A, please log into the web site [email or web address] and access using universal JAARS password for that web site. If you do not have the password, please call the hospital operator.  09/21/2023, 11:41 AM

## 2023-09-22 ENCOUNTER — Inpatient Hospital Stay (HOSPITAL_COMMUNITY): Payer: MEDICAID

## 2023-09-22 ENCOUNTER — Telehealth: Payer: Self-pay

## 2023-09-22 DIAGNOSIS — F17210 Nicotine dependence, cigarettes, uncomplicated: Secondary | ICD-10-CM | POA: Diagnosis not present

## 2023-09-22 DIAGNOSIS — Z89511 Acquired absence of right leg below knee: Secondary | ICD-10-CM | POA: Diagnosis not present

## 2023-09-22 DIAGNOSIS — S88111A Complete traumatic amputation at level between knee and ankle, right lower leg, initial encounter: Secondary | ICD-10-CM

## 2023-09-22 DIAGNOSIS — J189 Pneumonia, unspecified organism: Secondary | ICD-10-CM | POA: Diagnosis not present

## 2023-09-22 LAB — CULTURE, BLOOD (ROUTINE X 2)
Culture: NO GROWTH
Culture: NO GROWTH
Special Requests: ADEQUATE
Special Requests: ADEQUATE

## 2023-09-22 LAB — BASIC METABOLIC PANEL
Anion gap: 6 (ref 5–15)
BUN: 10 mg/dL (ref 6–20)
CO2: 28 mmol/L (ref 22–32)
Calcium: 8.5 mg/dL — ABNORMAL LOW (ref 8.9–10.3)
Chloride: 104 mmol/L (ref 98–111)
Creatinine, Ser: 0.87 mg/dL (ref 0.61–1.24)
GFR, Estimated: >60 mL/min (ref >60)
Glucose, Bld: 93 mg/dL (ref 70–99)
Potassium: 4.3 mmol/L (ref 3.5–5.1)
Sodium: 138 mmol/L (ref 135–145)

## 2023-09-22 LAB — CBC
HCT: 34.5 % — ABNORMAL LOW (ref 39.0–52.0)
Hemoglobin: 11.5 g/dL — ABNORMAL LOW (ref 13.0–17.0)
MCH: 31.3 pg (ref 26.0–34.0)
MCHC: 33.3 g/dL (ref 30.0–36.0)
MCV: 93.8 fL (ref 80.0–100.0)
Platelets: 205 10*3/uL (ref 150–400)
RBC: 3.68 MIL/uL — ABNORMAL LOW (ref 4.22–5.81)
RDW: 11.7 % (ref 11.5–15.5)
WBC: 4 10*3/uL (ref 4.0–10.5)
nRBC: 0 % (ref 0.0–0.2)

## 2023-09-22 LAB — MAGNESIUM: Magnesium: 1.9 mg/dL (ref 1.7–2.4)

## 2023-09-22 LAB — CULTURE, RESPIRATORY W GRAM STAIN

## 2023-09-22 LAB — T-HELPER CELLS (CD4) COUNT (NOT AT ARMC)
CD4 % Helper T Cell: 18 % — ABNORMAL LOW (ref 33–65)
CD4 T Cell Abs: 190 /uL — ABNORMAL LOW (ref 400–1790)

## 2023-09-22 NOTE — Progress Notes (Signed)
Pt being d/c.  PIVs removed Discharge information given.  Pt verb understanding and had no further questions Wheel chaired pt out to his vehicle.

## 2023-09-22 NOTE — Discharge Summary (Signed)
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 He has been reporting of pain and discomfort around his right stump for the past 2 weeks. Came to the ER 10 days ago, x-rays were negative and he was discharged with pain medications.  This admission, chest x-ray reveals concerns of right middle lobe pneumonia along with fever. MRI of his right stump revealed possible osteomyelitis changes, orthopedic consulted.  He was seen by ortho who recommended outpatient follow up.  Discharged with antibiotics and plan for ID and orthopedics follow up.   See below for additional details   Hospital Course:  Assessment and Plan:  Right middle lobe pneumonia Possible right stump cellulitis Right BKA - CT with RUL pneumonia (needs follow up to resolution) - 10/31 sputum cx with yeast, staph aureus  - blood cx NGTD - currently on augmentin/doxycycline per ID -> plan for 6 weeks per ID.  Follow  pending pneumocystitis smear and fungitell.   - MRI RLE with abnormal increased T2 signal within the distal tibial post surgical marrow concerning for acute osteomyelitis, increased T2 signal extends throughout the cortices of the posterior and medial (greater than lateral) aspects of the tibial stump (concerning for acute osteo).  Thin subcortical marro wedema extending along medial and lateral tibia through the proximal tibial metadiaphysis.  Increaesd T2 signal within distal fibular cortex concerning for osteo.  Moderate diffuse marrow edema throughout the patella.  Tiny joint effusion. - seen by Dr. Magnus Hodge from orthopedic, no urgent intervention.  Seen by Dr. Lajoyce Hodge on 11/4, recommending outpatient follow up, he'll try to inject the peroneal nerve.  Will need new socket and liner and electric power mobility device.     Patient will benefit from motorized wheelchair given his history of R BKA.  Curtis Hodge power wheelchair will assist with mobility needs for completion of ADL's including mobility for toileting, grooming, mobility to appointments.   History of HIV/AIDS -Unfortunately he has been noncompliant with his medications.  Now started on Biktarvy and Bactrim.  Has outpatient ID appointment as well.   Chronic Hep B  Cirrhosis - Hep B E Ag +, Hep B E Ab - ng - PCR 44,100,000  - follow with ID outpatient  - RUQ [place] with steatosis and cirrhosis    History of osteoarthritis status post right BKA -Supportive care as above   Polysubstance abuse - Counseled   Schizophrenia/depression -Does not appear to be any home medications   Hyperlipidemia - Statin    Procedures:  none  Consultations: Ortho ID  Discharge Exam:  Vitals:   09/22/23 0434 09/22/23 1246  BP: 127/82 126/78  Pulse: 66 80  Resp: 16 18  Temp: 97.6 F (36.4 C) 97.9 F (36.6 C)  SpO2: 99%    Concerned about getting his motorized wheelchair We spoke about this for 15-20 min  General: No acute distress.  Walking in  halls with mobility tech with cane.  Lungs: unlabored Neurological: Alert and oriented 56. Moves all extremities 4 with equal strength. Cranial nerves II through XII grossly intact. Extremities: R BKA  Discharge Instructions   Discharge Instructions     Ambulatory referral to Physical Therapy   Complete by: As directed    Iontophoresis - 4 mg/ml of dexamethasone: No   T.E.N.S. Unit Evaluation and Dispense as Indicated: No   Call MD for:  difficulty breathing, headache or visual disturbances   Complete by: As directed    Call MD for:  extreme fatigue   Complete by: As directed    Call MD for:  hives   Complete by: As directed    Call MD for:  persistant dizziness or light-headedness   Complete by: As directed    Call MD for:  persistant nausea and vomiting   Complete by: As directed    Call MD for:  redness, tenderness, or signs of infection (pain, swelling, redness, odor or green/yellow discharge around incision site)   Complete by: As directed    Call MD for:  severe uncontrolled pain   Complete by: As directed    Call MD for:  temperature >100.4   Complete by: As directed    Diet - low sodium heart healthy   Complete by: As directed    Discharge instructions   Complete by: As directed    You were seen for pneumonia as well as concern for infection at your stump.  We've got you on antibiotics with Curtis Hodge plan to continue the course for 6 weeks.    You were seen by Dr. Lajoyce Hodge who plans to follow up with you as an outpatient.    We'll refill your HIV medications.    Please follow up with infectious disease (your appointment is scheduled for 9 AM on 11/18, orthopedics, and your PCP (your appointment is scheduled for 11/20 at 9:50 AM with Dr. Alvis Hodge).  [facility] will reach out to you about measurements and starting insurance authorization.  We repeated Curtis Hodge liver ultrasound.  This is pending.  You can follow this up at your outpatient appointment.   Return for new, recurrent,  or worsening symptoms.  Please ask your PCP to request records from this hospitalization so they know what was done and what the next steps will be.   Increase activity slowly   Complete by: As directed       Allergies as of 09/22/2023       Reactions   Bee Pollen    bees   Beet Root [germanium]    beets        Medication List     STOP taking these medications    ibuprofen 600 MG tablet Commonly known as: ADVIL       TAKE these medications    acetaminophen 500 MG tablet Commonly known as: TYLENOL Take 500 mg by mouth every 6 (six) hours as needed for mild pain or moderate pain.   amoxicillin-clavulanate 875-125 MG tablet Commonly known as: AUGMENTIN Take 1 tablet by mouth 2 (two) times daily.   atorvastatin 40 MG tablet Commonly known as: LIPITOR Take 1  tablet (40 mg total) by mouth daily.   Biktarvy 50-200-25 MG Tabs tablet Generic drug: bictegravir-emtricitabine-tenofovir AF Take 1 tablet by mouth daily.   doxycycline 100 MG tablet Commonly known as: VIBRA-TABS Take 1 tablet (100 mg total) by mouth 2 (two) times daily.   gabapentin 300 MG capsule Commonly known as: NEURONTIN Take 1 capsule (300 mg total) by mouth 3 (three) times daily.   methocarbamol 500 MG tablet Commonly known as: ROBAXIN Take 1 tablet (500 mg total) by mouth 3 (three) times daily.   oxyCODONE 5 MG immediate release tablet Commonly known as: Oxy IR/ROXICODONE Take 5 mg by mouth every 6 (six) hours as needed for severe pain (pain score 7-10).   oxyCODONE-acetaminophen 5-325 MG tablet Commonly known as: PERCOCET/ROXICET Take 1 tablet by mouth every 8 (eight) hours as needed for severe pain.   pantoprazole 40 MG tablet Commonly known as: PROTONIX Take 1 tablet (40 mg total) by mouth daily.   sulfamethoxazole-trimethoprim 800-160 MG tablet Commonly known as: BACTRIM DS Take 1 tablet by mouth daily.   Ventolin HFA 108 (90 Base) MCG/ACT inhaler Generic drug: albuterol Inhale  2 puffs into the lungs every 6 (six) hours as needed for wheezing or shortness of breath.               Durable Medical Equipment  (From admission, onward)           Start     Ordered   09/22/23 1420  For home use only DME Other see comment  Once       Comments: Needs Electric wheelchair for mobility.  Question:  Length of Need  Answer:  Lifetime   09/22/23 1419           Allergies  Allergen Reactions   Bee Pollen     bees   Beet Root [Germanium]     beets    Follow-up Information     Curtis Register, MD Follow up on 10/08/2023.   Specialty: Family Medicine Why: Dr Curtis Hodge at 09:50 For hospital follow up and PCS/ Motorized wheelchair. Contact information: [address] [place] [address] [place] [address] [phone]         Nadara Mustard, MD Follow up in 1 week(s).   Specialty: Orthopedic Surgery Contact information: [address] [place] [address] [phone]         [facility] [place]. Call.   Specialty: Rehabilitation Why: Call the clinic and schedule Curtis Hodge follow up. Contact information: [address] [place] [address] [phone]        [facility], [address] [facility]. Call.   Why: Please follow up regarding your need for an electric wheelchair. Contact information: [address] Curtis Hodge [place] [address] [phone]                  The results of significant diagnostics from this hospitalization (including imaging, microbiology, ancillary and laboratory) are listed below for reference.    Significant Diagnostic Studies: CT CHEST W CONTRAST  Result Date: 09/18/2023 CLINICAL DATA:  Pneumonia.  Shortness of breath. EXAM: CT CHEST WITH CONTRAST TECHNIQUE: Multidetector CT imaging of the chest was performed during intravenous contrast administration. RADIATION DOSE REDUCTION: This exam was performed according to the departmental dose-optimization  program which includes automated exposure control, adjustment of the mA and/or kV according to patient size and/or use of iterative reconstruction technique. CONTRAST:  50mL OMNIPAQUE IOHEXOL 350 MG/ML SOLN COMPARISON:  Chest CT dated 11/02/2022.  FINDINGS: Evaluation is limited due to streak artifact caused by patient's arms. Cardiovascular: There is no cardiomegaly or pericardial effusion. Advanced 3 vessel coronary vascular calcification. Mild atherosclerotic calcification of the thoracic aorta. No aneurysm dilatation or dissection. The origins of the great vessels of the aortic arch and the central pulmonary arteries appear patent. Mediastinum/Nodes: No hilar or mediastinal adenopathy. The esophagus and the thyroid gland are grossly unremarkable. No mediastinal fluid collection. Lungs/Pleura: Background of emphysema and large bilateral upper lobe subpleural blebs measuring up to 11 cm in the right apex. An area of consolidation in the right upper lobe with air bronchogram new since the prior CT most consistent with pneumonia. Underlying mass is not excluded. Follow-up to resolution recommended. Interval resolution of the previously seen infiltrates. No pleural effusion or pneumothorax. The central airways remain patent. Upper Abdomen: No acute abnormality.  Small left renal calculus. Musculoskeletal: Loss of subcutaneous fat. No acute osseous pathology. IMPRESSION: 1. Right upper lobe pneumonia. Follow-up to resolution recommended. 2. Advanced coronary vascular calcification. 3. Aortic Atherosclerosis (ICD10-I70.0) and Emphysema (ICD10-J43.9). Electronically Signed   By: Elgie Collard M.D.   On: 09/18/2023 20:16   MR KNEE RIGHT W WO CONTRAST  Result Date: 09/18/2023 CLINICAL DATA:  Osteomyelitis status post right below-the-knee amputation. Septic right knee 05/2023. The patient reports over the last 2 weeks he has been having trouble with severe pain in right stump. EXAM: MRI OF THE RIGHT KNEE WITHOUT AND  WITH CONTRAST; MRI OF LOWER RIGHT EXTREMITY WITHOUT AND WITH CONTRAST TECHNIQUE: Multiplanar, multisequence MR imaging of the right knee and proximal right tibia and fibula including the patient's right below-the-knee amputation stump were performed both before and after administration of intravenous contrast. CONTRAST:  7mL GADAVIST GADOBUTROL 1 MMOL/ML IV SOLN COMPARISON:  Right knee radiographs 09/15/2023 and 05/15/2023.; CT right knee 05/15/2023; MRI right knee 05/19/2023 FINDINGS: MENISCI Medial meniscus:  Intact. Lateral meniscus:  Intact. LIGAMENTS Cruciates: The ACL and PCL are intact. Collaterals: The medial collateral ligament is intact. The fibular collateral ligament, biceps femoris tendon, iliotibial band, and popliteus tendon are intact. CARTILAGE Patellofemoral: There is moderate diffuse marrow edema seen throughout the patella which is similar to 05/19/2023. The multiloculated rim enhancing likely abscess previously seen anterior to the patella and lateral patellofemoral retinaculum has resolved. There is focal high-grade partial-thickness cartilage loss within the medial trochlea, unchanged (sagittal series 16, image 20). Medial: There is mild thinning of the medial aspect of the weight-bearing medial femoral condyle cartilage with mild subchondral marrow edema. Lateral:  Intact. Joint: Tiny joint effusion, similar to 05/19/2023. There is again nonspecific mild smooth peripheral synovial enhancement. Mild edema within the superolateral aspect of Hoffa's fat pad SOB seen with infrapatellar fat pad impingement. Popliteal Fossa:  No Baker's cyst. Extensor Mechanism:  Intact quadriceps tendon and patellar tendon. Bones: Postsurgical changes are seen of below-the-knee amputation. There is moderate subcutaneous fat edema and swelling within the distal amputation stump subcutaneous fat extending anteriorly. No walled-off fluid collection is seen to indicate an abscess. There is abnormal increased T2 signal  within the distal tibial postsurgical marrow measuring up to approximately 1.7 cm in craniocaudal dimension at the distal lateral aspect (coronal series 3, image 13) concerning for acute osteomyelitis. Increased T2 signal also extends throughout the cortices of the posterior and medial greater than lateral aspects of the tibial stump, appearing to track along nutrient foramina within the cortices. This measures up to approximately 8.8 cm in craniocaudal dimension at the medial cortex (coronal series 13, image 13).  There is very thin likely subcortical marrow edema also extending more superiorly along the medial and lateral tibia through the proximal metadiaphysis (coronal series 13, image 14). No significant marrow edema is seen at the distal tip of the fibular stump, however there is similar increased T2 signal within distal fibular cortex. This may be secondary to the vasculature, however is suspicious for osteomyelitis. Mild serpiginous decreased T1 and increased T2 signal within the central marrow of the proximal tibial diaphysis (sagittal series 13, image 14 and coronal series 12, image 14) is suggestive of an early bone infarct. Other: None. IMPRESSION: 1. Postsurgical changes of below-the-knee amputation. There is abnormal increased T2 signal within the distal tibial postsurgical marrow concerning for acute osteomyelitis in the region measuring up to approximately 1.7 cm in craniocaudal dimension. 2. Increased T2 signal also extends throughout the cortices of the posterior and medial greater than lateral aspects of the tibial stump. This remains suspicious for acute osteomyelitis involving the cortices, extending up to approximately 8.8 cm in craniocaudal length within the medial cortex. Additional very thin likely subcortical marrow edema also extending more superiorly along the medial and lateral tibia through the proximal tibial metadiaphysis. 3. No significant marrow edema is seen at the distal tip of the  fibular stump, however there is similar increased T2 signal within the distal fibular cortex measuring up to 6 cm in craniocaudal length. While this may be secondary to signal from cortical vasculature, this increased T2 signal is not seen within the distal femoral cortex, and this remains concerning for osteomyelitis. 4. Moderate diffuse marrow edema throughout the patella is similar to 05/19/2023. The multiloculated rim enhancing likely abscess previously seen anterior to the patella and lateral patellofemoral retinaculum has resolved. The marrow edema may be reactive, however it is difficult to exclude osteomyelitis. 5. Tiny joint effusion, similar to 05/19/2023. There is again nonspecific mild smooth peripheral synovial enhancement. This may be reactive. Electronically Signed   By: Neita Garnet M.D.   On: 09/18/2023 10:38   MR TIBIA FIBULA RIGHT W WO CONTRAST  Result Date: 09/18/2023 CLINICAL DATA:  Osteomyelitis status post right below-the-knee amputation. Septic right knee 05/2023. The patient reports over the last 2 weeks he has been having trouble with severe pain in right stump. EXAM: MRI OF THE RIGHT KNEE WITHOUT AND WITH CONTRAST; MRI OF LOWER RIGHT EXTREMITY WITHOUT AND WITH CONTRAST TECHNIQUE: Multiplanar, multisequence MR imaging of the right knee and proximal right tibia and fibula including the patient's right below-the-knee amputation stump were performed both before and after administration of intravenous contrast. CONTRAST:  7mL GADAVIST GADOBUTROL 1 MMOL/ML IV SOLN COMPARISON:  Right knee radiographs 09/15/2023 and 05/15/2023.; CT right knee 05/15/2023; MRI right knee 05/19/2023 FINDINGS: MENISCI Medial meniscus:  Intact. Lateral meniscus:  Intact. LIGAMENTS Cruciates: The ACL and PCL are intact. Collaterals: The medial collateral ligament is intact. The fibular collateral ligament, biceps femoris tendon, iliotibial band, and popliteus tendon are intact. CARTILAGE Patellofemoral: There is  moderate diffuse marrow edema seen throughout the patella which is similar to 05/19/2023. The multiloculated rim enhancing likely abscess previously seen anterior to the patella and lateral patellofemoral retinaculum has resolved. There is focal high-grade partial-thickness cartilage loss within the medial trochlea, unchanged (sagittal series 16, image 20). Medial: There is mild thinning of the medial aspect of the weight-bearing medial femoral condyle cartilage with mild subchondral marrow edema. Lateral:  Intact. Joint: Tiny joint effusion, similar to 05/19/2023. There is again nonspecific mild smooth peripheral synovial enhancement. Mild edema within the  superolateral aspect of Hoffa's fat pad SOB seen with infrapatellar fat pad impingement. Popliteal Fossa:  No Baker's cyst. Extensor Mechanism:  Intact quadriceps tendon and patellar tendon. Bones: Postsurgical changes are seen of below-the-knee amputation. There is moderate subcutaneous fat edema and swelling within the distal amputation stump subcutaneous fat extending anteriorly. No walled-off fluid collection is seen to indicate an abscess. There is abnormal increased T2 signal within the distal tibial postsurgical marrow measuring up to approximately 1.7 cm in craniocaudal dimension at the distal lateral aspect (coronal series 3, image 13) concerning for acute osteomyelitis. Increased T2 signal also extends throughout the cortices of the posterior and medial greater than lateral aspects of the tibial stump, appearing to track along nutrient foramina within the cortices. This measures up to approximately 8.8 cm in craniocaudal dimension at the medial cortex (coronal series 13, image 13). There is very thin likely subcortical marrow edema also extending more superiorly along the medial and lateral tibia through the proximal metadiaphysis (coronal series 13, image 14). No significant marrow edema is seen at the distal tip of the fibular stump, however there is  similar increased T2 signal within distal fibular cortex. This may be secondary to the vasculature, however is suspicious for osteomyelitis. Mild serpiginous decreased T1 and increased T2 signal within the central marrow of the proximal tibial diaphysis (sagittal series 13, image 14 and coronal series 12, image 14) is suggestive of an early bone infarct. Other: None. IMPRESSION: 1. Postsurgical changes of below-the-knee amputation. There is abnormal increased T2 signal within the distal tibial postsurgical marrow concerning for acute osteomyelitis in the region measuring up to approximately 1.7 cm in craniocaudal dimension. 2. Increased T2 signal also extends throughout the cortices of the posterior and medial greater than lateral aspects of the tibial stump. This remains suspicious for acute osteomyelitis involving the cortices, extending up to approximately 8.8 cm in craniocaudal length within the medial cortex. Additional very thin likely subcortical marrow edema also extending more superiorly along the medial and lateral tibia through the proximal tibial metadiaphysis. 3. No significant marrow edema is seen at the distal tip of the fibular stump, however there is similar increased T2 signal within the distal fibular cortex measuring up to 6 cm in craniocaudal length. While this may be secondary to signal from cortical vasculature, this increased T2 signal is not seen within the distal femoral cortex, and this remains concerning for osteomyelitis. 4. Moderate diffuse marrow edema throughout the patella is similar to 05/19/2023. The multiloculated rim enhancing likely abscess previously seen anterior to the patella and lateral patellofemoral retinaculum has resolved. The marrow edema may be reactive, however it is difficult to exclude osteomyelitis. 5. Tiny joint effusion, similar to 05/19/2023. There is again nonspecific mild smooth peripheral synovial enhancement. This may be reactive. Electronically Signed    By: Neita Garnet M.D.   On: 09/18/2023 10:38   DG Chest 2 View  Result Date: 09/17/2023 CLINICAL DATA:  COPD EXAM: CHEST - 2 VIEW COMPARISON:  05/15/2023 FINDINGS: Heart size and pulmonary vascularity are normal. Severe emphysematous changes throughout the lungs with large bullous changes in the right upper lung. Central interstitial changes with peribronchial thickening suggesting chronic bronchitis. Superimposed area of consolidation in the anterior right upper lung, new since prior study. This likely represents focal pneumonia. Follow-up to resolution is recommended to exclude underlying focal lesion. No pleural effusions. No pneumothorax. Mediastinal contours appear intact. IMPRESSION: 1. Focal consolidation in the anterior right upper lung, new since prior study. This is likely  pneumonia but follow-up to resolution is recommended to exclude underlying focal lesion. 2. Severe emphysematous changes and bronchitic changes in the lungs similar to prior study. Electronically Signed   By: Burman Nieves M.D.   On: 09/17/2023 19:42   DG Knee Complete 4 Views Right  Result Date: 09/07/2023 CLINICAL DATA:  Right knee pain.  No known injury. EXAM: RIGHT KNEE - COMPLETE 4+ VIEW COMPARISON:  05/15/2023. FINDINGS: There is disuse osteopenia of the visualized osseous structures. Redemonstration of below-knee amputation. The resection margins are sharp. No focal bone erosions. No overlying soft tissue defect or air within the soft tissue. No acute fracture or dislocation. No aggressive osseous lesion. Mild degenerative changes of the knee joint. No knee effusion or focal soft tissue swelling. No radiopaque foreign bodies. IMPRESSION: 1. No acute fracture or dislocation. 2. Disuse osteopenia.  Below-knee amputation. Electronically Signed   By: Jules Schick M.D.   On: 09/07/2023 09:06    Microbiology: Recent Results (from the past 240 hour(s))  Blood culture (routine x 2)     Status: None (Preliminary result)    Collection Time: 09/17/23  9:23 PM   Specimen: BLOOD  Result Value Ref Range Status   Specimen Description BLOOD RIGHT ANTECUBITAL  Final   Special Requests   Final    BOTTLES DRAWN AEROBIC AND ANAEROBIC Blood Culture adequate volume   Culture   Final    NO GROWTH 4 DAYS Performed at [facility], [address], [place], [place] [address]    Report Status PENDING  Incomplete  Blood culture (routine x 2)     Status: None (Preliminary result)   Collection Time: 09/17/23  9:23 PM   Specimen: BLOOD  Result Value Ref Range Status   Specimen Description BLOOD LEFT ANTECUBITAL  Final   Special Requests   Final    BOTTLES DRAWN AEROBIC AND ANAEROBIC Blood Culture adequate volume   Culture   Final    NO GROWTH 4 DAYS Performed at [facility], [address], [place], [place] [address]    Report Status PENDING  Incomplete  Resp panel by RT-PCR (RSV, Flu Coben Godshall&B, Covid) Anterior Nasal Swab     Status: None   Collection Time: 09/18/23  2:28 AM   Specimen: Anterior Nasal Swab  Result Value Ref Range Status   SARS Coronavirus 2 by RT PCR NEGATIVE NEGATIVE Final   Influenza Randee Huston by PCR NEGATIVE NEGATIVE Final   Influenza B by PCR NEGATIVE NEGATIVE Final    Comment: (NOTE) The Xpert Xpress SARS-CoV-2/FLU/RSV plus assay is intended as an aid in the diagnosis of influenza from Nasopharyngeal swab specimens and should not be used as Harleen Fineberg sole basis for treatment. Nasal washings and aspirates are unacceptable for Xpert Xpress SARS-CoV-2/FLU/RSV testing.  Fact Sheet for Patients: BloggerCourse.com  Fact Sheet for Healthcare Providers: SeriousBroker.it  This test is not yet approved or cleared by the [place] FDA and has been authorized for detection and/or diagnosis of SARS-CoV-2 by FDA under an Emergency Use Authorization (EUA). This EUA will remain in effect (meaning this test can be used) for the duration of the COVID-19  declaration under Section 564(b)(1) of the Act, 21 U.S.C. section 360bbb-3(b)(1), unless the authorization is terminated or revoked.     Resp Syncytial Virus by PCR NEGATIVE NEGATIVE Final    Comment: (NOTE) Fact Sheet for Patients: BloggerCourse.com  Fact Sheet for Healthcare Providers: SeriousBroker.it  This test is not yet approved or cleared by the [place] FDA  and has been authorized for detection and/or diagnosis of SARS-CoV-2 by FDA under an Emergency Use Authorization (EUA). This EUA will remain in effect (meaning this test can be used) for the duration of the COVID-19 declaration under Section 564(b)(1) of the Act, 21 U.S.C. section 360bbb-3(b)(1), unless the authorization is terminated or revoked.  Performed at [facility], [address], [place], [place] [address]   MRSA Next Gen by PCR, Nasal     Status: None   Collection Time: 09/18/23 11:06 AM   Specimen: Nasal Mucosa; Nasal Swab  Result Value Ref Range Status   MRSA by PCR Next Gen NOT DETECTED NOT DETECTED Final    Comment: (NOTE) The GeneXpert MRSA Assay (FDA approved for NASAL specimens only), is one component of Franshesca Chipman comprehensive MRSA colonization surveillance program. It is not intended to diagnose MRSA infection nor to guide or monitor treatment for MRSA infections. Test performance is not FDA approved in patients less than 60 years old. Performed at [facility], [address] [place], [place] [address]   Culture, Respiratory w Gram Stain     Status: None   Collection Time: 09/18/23  3:04 PM   Specimen: Sputum; Respiratory  Result Value Ref Range Status   Specimen Description SPU  Final   Special Requests NONE  Final   Gram Stain   Final    ABUNDANT SQUAMOUS EPITHELIAL CELLS PRESENT MODERATE GRAM NEGATIVE RODS FEW GRAM POSITIVE COCCI FEW GRAM POSITIVE RODS RARE YEAST WITH PSEUDOHYPHAE Performed at [facility], [address], [place], [place] [address]    Culture FEW STAPHYLOCOCCUS AUREUS FEW CANDIDA ALBICANS   Final   Report Status 09/22/2023 FINAL  Final   Organism ID, Bacteria STAPHYLOCOCCUS AUREUS  Final      Susceptibility   Staphylococcus aureus - MIC*    CIPROFLOXACIN <=0.5 SENSITIVE Sensitive     ERYTHROMYCIN >=8 RESISTANT Resistant     GENTAMICIN 4 SENSITIVE Sensitive     OXACILLIN 0.5 SENSITIVE Sensitive     TETRACYCLINE <=1 SENSITIVE Sensitive     VANCOMYCIN 1 SENSITIVE Sensitive     TRIMETH/SULFA >=320 RESISTANT Resistant     CLINDAMYCIN RESISTANT Resistant     RIFAMPIN <=0.5 SENSITIVE Sensitive     Inducible Clindamycin POSITIVE Resistant     LINEZOLID 2 SENSITIVE Sensitive     * FEW STAPHYLOCOCCUS AUREUS     Labs: Basic Metabolic Panel: Recent Labs  Lab 09/18/23 0535 09/19/23 0624 09/20/23 0348 09/21/23 0358 09/22/23 0433  NA 138 136 135 138 138  K 3.6 3.4* 4.0 4.4 4.3  CL 101 106 103 104 104  CO2 25 24 24 27 28   GLUCOSE 87 107* 126* 101* 93  BUN 12 7 7 6 10   CREATININE 0.94 1.03 0.99 0.96 0.87  CALCIUM 8.3* 7.9* 8.1* 8.5* 8.5*  MG  --  2.0 2.0 1.9 1.9  PHOS  --  2.2*  --   --   --    Liver Function Tests: Recent Labs  Lab 09/19/23 0624  AST 37  ALT 37  ALKPHOS 70  BILITOT 0.4  PROT 6.6  ALBUMIN 2.3*   No results for input(s): "LIPASE", "AMYLASE" in the last 168 hours. No results for input(s): "AMMONIA" in the last 168 hours. CBC: Recent Labs  Lab 09/18/23 0535 09/19/23 0624 09/20/23 0348 09/21/23 0358 09/22/23 0433  WBC 5.6 4.9 4.0 3.8* 4.0  HGB 12.1* 11.7* 11.4* 11.4* 11.5*  HCT 35.8* 35.3* 33.8* 35.2* 34.5*  MCV 93.5 95.7 95.5 94.1 93.8  PLT 133* 159 161 179 205   Cardiac Enzymes: No results for input(s): "CKTOTAL", "CKMB", "CKMBINDEX", "TROPONINI" in the last 168 hours. BNP: BNP (last 3 results) Recent Labs    11/02/22 0956 09/19/23 0624  BNP 128.0* 74.5    ProBNP (last 3 results) No results for input(s): "PROBNP" in the last 8760  hours.  CBG: No results for input(s): "GLUCAP" in the last 168 hours.     Signed:  Lacretia Nicks MD.  [facility] 09/22/2023, 4:24 PM

## 2023-09-22 NOTE — Progress Notes (Signed)
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 osseous lesion. Mild degenerative changes of the knee joint. No knee effusion or focal soft tissue swelling. No radiopaque foreign bodies. IMPRESSION: 1. No acute fracture or dislocation. 2. Disuse osteopenia.  Below-knee amputation. Electronically Signed   By: Jules Schick M.D.   On: 09/07/2023 09:06    I have personally spent 54 minutes involved in face-to-face and non-face-to-face activities for this patient on the day of the visit. Professional time spent includes the following activities: Preparing to see the patient (review of tests), Obtaining and/or reviewing separately obtained history (admission/discharge record), Performing a medically appropriate examination and/or evaluation , Ordering medications/tests/procedures, referring and communicating with other health care professionals, Documenting clinical information in the EMR,  Independently interpreting results (not separately reported), Communicating results to the patient/family/caregiver, Counseling and educating the patient/family/caregiver and Care coordination (not separately reported).   Plan d/w requesting provider as well as ID pharm D  Of note, portions of this note may have been created with voice recognition software. While this note has been edited for accuracy, occasional wrong-word or 'sound-a-like' substitutions may have occurred due to the inherent limitations of voice recognition software.   Electronically signed by:   Odette Fraction, MD Infectious Disease Physician [facility] Pager: [phone]

## 2023-09-22 NOTE — Telephone Encounter (Signed)
Call received for Luan Pulling,  RN . Advised that patient will be discharged for [facility] hospital today. Marcelino Duster is requesting appointment for patient .  Appointment for 10/08/2023 given. Patient has an order for Chiropodist. Marcelino Duster voiced that she had spoken to [place] at [facility] and he has accepted the referral for a motorized wheelchair. This is why she would like the patient seen in office as soon as possible.
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
## 2023-09-22 NOTE — TOC Transition Note (Addendum)
Transition of Care [facility]) - CM/SW Discharge Note   Patient Details  Name: Curtis Hodge MRN: [id number] Date of Birth: 1966-05-21  Transition of Care [facility]) CM/SW Contact:  Janae Bridgeman, RN Phone Number: 09/22/2023, 2:13 PM   Clinical Narrative:    CM met with the patient at the bedside to discuss TOC needs for Electic wheelchair for home.  The patient states that he has been trying to get an electric wheelchair for months and unable to obtain.  New electric wheelchair order was placed and Dr. Lowell Guitar cosigned.  Hospital follow up with Dr. Alvis Lemmings was changed to 11/20 and noted in the AVS.  I called and spoke with Verdie Mosher, CM with [facility] and he accepted the referral for potential order for electric wheelchair.  He states that he will call the patient and set up for potential follow up at the home this week.  Patient is aware that [facility] will call him and should arrange an appointment for him this week.  [facility] office is aware of pending plan to [facility] to order and I asked that they follow up with the patient to assist in the navigation process to obtain an electric wheelchair.  Patient was provided with 4 bus passes for transportation home along with appointments.  Updated clinicals, facesheet, PT eval and wheelchair order was emailed to [employer] [phone] with [facility] to email -[email or web address].  Discharge medications will be provided through [facility] pharmacy prior to discharge to home.  09/22/23 1626 - CM spoke with Verdie Mosher with [facility] and he plans to meet the patient at the home tomorrow at 11 am.         Patient Goals and CMS Choice      Discharge Placement                         Discharge Plan and Services Additional resources added to the After Visit Summary for                                       Social Determinants of Health (SDOH) Interventions SDOH Screenings   Food  Insecurity: No Food Insecurity (09/17/2023)  Housing: Patient Declined (09/17/2023)  Transportation Needs: No Transportation Needs (09/17/2023)  Utilities: Not At Risk (09/17/2023)  Alcohol Screen: Low Risk  (10/25/2017)  Depression (PHQ2-9): High Risk (02/26/2023)  Tobacco Use: High Risk (09/17/2023)     Readmission Risk Interventions     No data to display

## 2023-09-22 NOTE — Progress Notes (Addendum)
   09/22/23 0726  Spiritual Encounters  Type of Visit Initial  Care provided to: Patient  Conversation partners present during encounter Nurse  Referral source Nurse (RN/NT/LPN)  Reason for visit Routine spiritual support  OnCall Visit Yes   Chaplain responded call from RN that Pt seemed to need support. Arriving in the room chaplain worked to establish a relationship of care and support through compassionate presence and reflective listening.  Pt spoke at length about his unique needs and desperate circumstances.  Pt spoke of his frustration at being told he qualifies for various types of disability benefits yet not actually receiving any of those benefits. Pt states his greatest needs are a place to stay, a phone to make and confirm his medical appointments, and a motorized wheelchair.  Chaplain services remain available by Spiritual Consult or for emergent cases, paging [phone]  Chaplain Raelene Bott, MDiv [email or web address] [phone]

## 2023-09-22 NOTE — Progress Notes (Signed)
Mobility Specialist Progress Note:   09/22/23 1100  Mobility  Activity Ambulated with assistance in hallway  Level of Assistance Modified independent, requires aide device or extra time  Assistive Device Cane  Distance Ambulated (ft) 400 ft (x2)  Activity Response Tolerated well  Mobility Referral Yes  $Mobility charge 1 Mobility  Mobility Specialist Start Time (ACUTE ONLY) 1040  Mobility Specialist Stop Time (ACUTE ONLY) 1105  Mobility Specialist Time Calculation (min) (ACUTE ONLY) 25 min    Received pt in chair having no complaints and agreeable to mobility. Pt was asymptomatic throughout ambulation and returned to room w/o fault. Left in chair w/ call bell in reach and all needs met.   [place]ante Earlene Plater Mobility Specialist Please contact via Special educational needs teacher or Rehab office at [phone]

## 2023-09-23 ENCOUNTER — Other Ambulatory Visit: Payer: Self-pay | Admitting: Family Medicine

## 2023-09-23 ENCOUNTER — Telehealth: Payer: Self-pay

## 2023-09-23 NOTE — Progress Notes (Signed)
Discharge summary from attending physician was sent to [facility] via secure email to [email or web address] to assist with obtaining a motorized wheelchair if patient is able to qualify.  I asked that [facility] follow up with PCP or Dr. Audrie Lia office for final signatures if needed for insurance authorization.

## 2023-09-23 NOTE — Transitions of Care (Post Inpatient/ED Visit) (Signed)
   09/23/2023  Name: Curtis Hodge MRN: [id number] DOB: [birth year]  Today's TOC FU Call Status: Today's TOC FU Call Status:: Unsuccessful Call (1st Attempt) Unsuccessful Call (1st Attempt) Date: 09/23/23  Attempted to reach the patient regarding the most recent Inpatient/ED visit.  Follow Up Plan: Additional outreach attempts will be made to reach the patient to complete the Transitions of Care (Post Inpatient/ED visit) call.  Alyse Low, RN, BA, [facility], CRRN [facility] Coordinator, Transition of Care Ph # [phone]

## 2023-09-23 NOTE — Telephone Encounter (Signed)
I spoke to the patient and informed him that he has 2 HFU appointments at [facility] scheduled for 11/20 and 11/22.  I explained that I was going to cancel the appointment for 11/22 with Dr Laural Benes.   I asked him if he had been in contact with [facility] about the power wheelchair and he said Curtis Hodge is supposed to be at this house at 1100 today.  As we were speaking, Curtis Hodge arrived. I then spoke to Curtis Hodge and he said he thinks he has all of the documentation that he needs for the power chair order. He stated that the discharge summary should be sufficient and he does not think a PT eval is necessary as the patient was assessed by PT. in the hospital. He said he received the order from Dr Lajoyce Corners and at this time there is nothing needed from Dr Alvis Lemmings

## 2023-09-24 ENCOUNTER — Telehealth: Payer: Self-pay

## 2023-09-24 LAB — FUNGITELL BETA-D-GLUCAN
Fungitell Value:: <31.25 pg/mL
Result Name:: NEGATIVE

## 2023-09-24 NOTE — Transitions of Care (Post Inpatient/ED Visit) (Signed)
   09/24/2023  Name: Curtis Hodge MRN: [id number] DOB: 1966-05-28  Today's TOC FU Call Status: Today's TOC FU Call Status:: Unsuccessful Call (2nd Attempt) Unsuccessful Call (2nd Attempt) Date: 09/24/23  Attempted to reach the patient regarding the most recent Inpatient/ED visit.  Follow Up Plan: Additional outreach attempts will be made to reach the patient to complete the Transitions of Care (Post Inpatient/ED visit) call.   Alyse Low, RN, BA, [facility], CRRN [facility] Coordinator, Transition of Care Ph # [phone]

## 2023-09-25 ENCOUNTER — Telehealth: Payer: Self-pay

## 2023-09-25 NOTE — Transitions of Care (Post Inpatient/ED Visit) (Signed)
   09/25/2023  Name: Zavier Canela MRN: [id number] DOB: 1966-06-01  Today's TOC FU Call Status: Today's TOC FU Call Status:: Unsuccessful Call (3rd Attempt) Unsuccessful Call (3rd Attempt) Date: 09/25/23  Attempted to reach the patient regarding the most recent Inpatient/ED visit.  Follow Up Plan: No further outreach attempts will be made at this time. We have been unable to contact the patient.  Alyse Low, RN, BA, [facility], CRRN [facility] Coordinator, Transition of Care Ph # [phone]

## 2023-10-02 ENCOUNTER — Ambulatory Visit: Payer: MEDICAID | Admitting: Physical Therapy

## 2023-10-06 ENCOUNTER — Inpatient Hospital Stay: Payer: MEDICAID | Admitting: Infectious Disease

## 2023-10-06 ENCOUNTER — Encounter: Payer: Self-pay | Admitting: Infectious Disease

## 2023-10-06 DIAGNOSIS — K7460 Unspecified cirrhosis of liver: Secondary | ICD-10-CM | POA: Insufficient documentation

## 2023-10-06 DIAGNOSIS — M869 Osteomyelitis, unspecified: Secondary | ICD-10-CM | POA: Insufficient documentation

## 2023-10-06 DIAGNOSIS — J189 Pneumonia, unspecified organism: Secondary | ICD-10-CM

## 2023-10-06 HISTORY — DX: Unspecified cirrhosis of liver: K74.60

## 2023-10-06 HISTORY — DX: Pneumonia, unspecified organism: J18.9

## 2023-10-06 HISTORY — DX: Osteomyelitis, unspecified: M86.9
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
## 2023-10-08 ENCOUNTER — Ambulatory Visit: Payer: MEDICAID | Admitting: Family Medicine

## 2023-10-10 ENCOUNTER — Inpatient Hospital Stay: Payer: MEDICAID | Admitting: Internal Medicine

## 2023-10-29 ENCOUNTER — Emergency Department (HOSPITAL_COMMUNITY): Payer: MEDICAID

## 2023-10-29 ENCOUNTER — Other Ambulatory Visit: Payer: Self-pay

## 2023-10-29 ENCOUNTER — Emergency Department (HOSPITAL_COMMUNITY)
Admission: EM | Admit: 2023-10-29 | Discharge: 2023-10-29 | Disposition: A | Discharge status: Home / Self Care | Payer: MEDICAID | Attending: Emergency Medicine | Admitting: Emergency Medicine

## 2023-10-29 DIAGNOSIS — Z79899 Other long term (current) drug therapy: Secondary | ICD-10-CM | POA: Insufficient documentation

## 2023-10-29 DIAGNOSIS — R531 Weakness: Secondary | ICD-10-CM | POA: Diagnosis present

## 2023-10-29 DIAGNOSIS — R930 Abnormal findings on diagnostic imaging of skull and head, not elsewhere classified: Secondary | ICD-10-CM | POA: Insufficient documentation

## 2023-10-29 DIAGNOSIS — R4781 Slurred speech: Secondary | ICD-10-CM | POA: Insufficient documentation

## 2023-10-29 LAB — COMPREHENSIVE METABOLIC PANEL
ALT: 31 U/L (ref 0–44)
AST: 40 U/L (ref 15–41)
Albumin: 3.2 g/dL — ABNORMAL LOW (ref 3.5–5.0)
Alkaline Phosphatase: 84 U/L (ref 38–126)
Anion gap: 8 (ref 5–15)
BUN: 11 mg/dL (ref 6–20)
CO2: 26 mmol/L (ref 22–32)
Calcium: 9.1 mg/dL (ref 8.9–10.3)
Chloride: 103 mmol/L (ref 98–111)
Creatinine, Ser: 1 mg/dL (ref 0.61–1.24)
GFR, Estimated: >60 mL/min (ref >60)
Glucose, Bld: 128 mg/dL — ABNORMAL HIGH (ref 70–99)
Potassium: 3.7 mmol/L (ref 3.5–5.1)
Sodium: 137 mmol/L (ref 135–145)
Total Bilirubin: 0.8 mg/dL (ref <1.2)
Total Protein: 7.8 g/dL (ref 6.5–8.1)

## 2023-10-29 LAB — DIFFERENTIAL
Abs Immature Granulocytes: 0.01 10*3/uL (ref 0.00–0.07)
Basophils Absolute: 0 10*3/uL (ref 0.0–0.1)
Basophils Relative: 1 %
Eosinophils Absolute: 0 10*3/uL (ref 0.0–0.5)
Eosinophils Relative: 1 %
Immature Granulocytes: 0 %
Lymphocytes Relative: 41 %
Lymphs Abs: 1.1 10*3/uL (ref 0.7–4.0)
Monocytes Absolute: 0.2 10*3/uL (ref 0.1–1.0)
Monocytes Relative: 7 %
Neutro Abs: 1.4 10*3/uL — ABNORMAL LOW (ref 1.7–7.7)
Neutrophils Relative %: 50 %

## 2023-10-29 LAB — I-STAT CHEM 8, ED
BUN: 13 mg/dL (ref 6–20)
Calcium, Ion: 1.1 mmol/L — ABNORMAL LOW (ref 1.15–1.40)
Chloride: 101 mmol/L (ref 98–111)
Creatinine, Ser: 1.1 mg/dL (ref 0.61–1.24)
Glucose, Bld: 123 mg/dL — ABNORMAL HIGH (ref 70–99)
HCT: 42 % (ref 39.0–52.0)
Hemoglobin: 14.3 g/dL (ref 13.0–17.0)
Potassium: 3.9 mmol/L (ref 3.5–5.1)
Sodium: 142 mmol/L (ref 135–145)
TCO2: 29 mmol/L (ref 22–32)

## 2023-10-29 LAB — CBC
HCT: 42.2 % (ref 39.0–52.0)
Hemoglobin: 13.8 g/dL (ref 13.0–17.0)
MCH: 31.7 pg (ref 26.0–34.0)
MCHC: 32.7 g/dL (ref 30.0–36.0)
MCV: 96.8 fL (ref 80.0–100.0)
Platelets: 189 10*3/uL (ref 150–400)
RBC: 4.36 MIL/uL (ref 4.22–5.81)
RDW: 12.6 % (ref 11.5–15.5)
WBC: 2.8 10*3/uL — ABNORMAL LOW (ref 4.0–10.5)
nRBC: 0 % (ref 0.0–0.2)

## 2023-10-29 LAB — CBG MONITORING, ED: Glucose-Capillary: 122 mg/dL — ABNORMAL HIGH (ref 70–99)

## 2023-10-29 LAB — PROTIME-INR
INR: 1.1 (ref 0.8–1.2)
Prothrombin Time: 14.1 s (ref 11.4–15.2)

## 2023-10-29 LAB — APTT: aPTT: 26 s (ref 24–36)

## 2023-10-29 LAB — ETHANOL: Alcohol, Ethyl (B): <10 mg/dL (ref <10)

## 2023-10-29 MED ORDER — METOCLOPRAMIDE HCL 5 MG/ML IJ SOLN
10.0000 mg | Freq: Once | INTRAMUSCULAR | Status: AC
  Administered 2023-10-29: 10 mg via INTRAVENOUS
  Filled 2023-10-29: qty 2

## 2023-10-29 MED ORDER — KETOROLAC TROMETHAMINE 15 MG/ML IJ SOLN
15.0000 mg | Freq: Once | INTRAMUSCULAR | Status: AC
  Administered 2023-10-29: 15 mg via INTRAVENOUS
  Filled 2023-10-29: qty 1

## 2023-10-29 MED ORDER — IOHEXOL 350 MG/ML SOLN
75.0000 mL | Freq: Once | INTRAVENOUS | Status: AC | PRN
  Administered 2023-10-29: 75 mL via INTRAVENOUS

## 2023-10-29 MED ORDER — DIPHENHYDRAMINE HCL 50 MG/ML IJ SOLN
25.0000 mg | Freq: Once | INTRAMUSCULAR | Status: AC
  Administered 2023-10-29: 25 mg via INTRAVENOUS
  Filled 2023-10-29: qty 1

## 2023-10-29 MED ORDER — SODIUM CHLORIDE 0.9% FLUSH: 3.0000 mL | Freq: Once | INTRAVENOUS | Status: DC
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
## 2023-10-29 NOTE — ED Triage Notes (Signed)
Patient BIB [facility], Patient walked to [employer] from his home when he started to feel left sided weakness and left sided deficit with no sensation to left side with slurred speech. LKW 1400.

## 2023-10-29 NOTE — Code Documentation (Signed)
Walter Barron is a 57 yr old male arriving to [facility] via EMS on 10/29/2023. He has a PMH of rt leg amputation, homelessness, substance abuse, HIV. Pt is coming from [employer], where he was last known well today at 1400. After that his left side began to hurt, and now it is weak and has decreased sensation. He is not on any thinners.     Pt met at bridge by stroke team. Labs, CBG obtained. Airway cleared by EDP. Pt to CT with team. NIHSS 7. Pt with Left sided weakness, sensory loss and dysarthria. The following imaging was obtained: CT, CTA. Per Dr. Amada Jupiter, CT was neg for hemorrhage, CTA was negative for LVO. We obtained a STAT MRI head for TNK eligibility. Per Dr. Amada Jupiter, MRI is negative for stroke. Code stroke cancelled at 1601.

## 2023-10-29 NOTE — ED Provider Notes (Signed)
[place] EMERGENCY DEPARTMENT AT [facility] Provider Note   CSN: [id number] Arrival date & time: 10/29/23  1520  An emergency department physician performed an initial assessment on this suspected stroke patient at 1522.  History  Chief Complaint  Patient presents with   Code Stroke    Curtis Hodge is a 57 y.o. male.  HPI 57 year old male presents with left-sided weakness.  Brought in as a code stroke.  History is primarily from EMS due to difficulty understanding the patient.  He reported to EMS that he was normal when he got to [employer] at 2 PM.  While having lunch he developed acute left-sided weakness and trouble speaking.  Seems to have left arm and leg weakness as well as difficulty phonating.  History is otherwise limited.  Home Medications Prior to Admission medications   Medication Sig Start Date End Date Taking? Authorizing Provider  acetaminophen (TYLENOL) 500 MG tablet Take 500 mg by mouth every 6 (six) hours as needed for mild pain or moderate pain.    [provider]  albuterol (VENTOLIN HFA) 108 (90 Base) MCG/ACT inhaler Inhale 2 puffs into the lungs every 6 (six) hours as needed for wheezing or shortness of breath. 02/26/23   Anders Simmonds, PA-C  amoxicillin-clavulanate (AUGMENTIN) 875-125 MG tablet Take 1 tablet by mouth 2 (two) times daily. 09/19/23   Danelle Earthly, MD  atorvastatin (LIPITOR) 40 MG tablet Take 1 tablet (40 mg total) by mouth daily. 02/26/23   Anders Simmonds, PA-C  bictegravir-emtricitabine-tenofovir AF (BIKTARVY) 50-200-25 MG TABS tablet Take 1 tablet by mouth daily. 09/19/23   Danelle Earthly, MD  doxycycline (VIBRA-TABS) 100 MG tablet Take 1 tablet (100 mg total) by mouth 2 (two) times daily. 09/19/23   Danelle Earthly, MD  gabapentin (NEURONTIN) 300 MG capsule Take 1 capsule (300 mg total) by mouth 3 (three) times daily. 04/18/23   Adonis Huguenin, NP  methocarbamol (ROBAXIN) 500 MG tablet Take 1 tablet (500 mg total)  by mouth 3 (three) times daily. 05/26/23   Rhetta Mura, MD  oxyCODONE (OXY IR/ROXICODONE) 5 MG immediate release tablet Take 5 mg by mouth every 6 (six) hours as needed for severe pain (pain score 7-10).    [provider]  oxyCODONE-acetaminophen (PERCOCET/ROXICET) 5-325 MG tablet Take 1 tablet by mouth every 8 (eight) hours as needed for severe pain. 08/18/23   Nadara Mustard, MD  pantoprazole (PROTONIX) 40 MG tablet Take 1 tablet (40 mg total) by mouth daily. 05/26/23   Rhetta Mura, MD  sulfamethoxazole-trimethoprim (BACTRIM DS) 800-160 MG tablet Take 1 tablet by mouth daily. 09/19/23   Danelle Earthly, MD      Allergies    Bee pollen and Beet root [germanium]    Review of Systems   Review of Systems  Unable to perform ROS: Acuity of condition    Physical Exam Updated Vital Signs BP 127/77 (BP Location: Right Arm)   Pulse 78   Temp 99.1 F (37.3 C) (Oral)   Resp 15   Wt 72.6 kg   SpO2 100%   BMI 22.97 kg/m  Physical Exam Vitals and nursing note reviewed.  Constitutional:      Appearance: He is well-developed.  HENT:     Head: Normocephalic and atraumatic.  Cardiovascular:     Rate and Rhythm: Normal rate and regular rhythm.     Pulses:          Radial pulses are 2+ on the left side.  Heart sounds: Normal heart sounds.  Pulmonary:     Effort: Pulmonary effort is normal.  Abdominal:     General: There is no distension.     Palpations: Abdomen is soft.  Skin:    General: Skin is warm and dry.  Neurological:     Mental Status: He is alert.     Comments: No obvious facial droop.  Does have difficulty speaking and slurred speech.  Has a left upper and lower extremity weakness.     ED Results / Procedures / Treatments   Labs (all labs ordered are listed, but only abnormal results are displayed) Labs Reviewed  CBC - Abnormal; Notable for the following components:      Result Value   WBC 2.8 (*)    All other components within normal limits   DIFFERENTIAL - Abnormal; Notable for the following components:   Neutro Abs 1.4 (*)    All other components within normal limits  COMPREHENSIVE METABOLIC PANEL - Abnormal; Notable for the following components:   Glucose, Bld 128 (*)    Albumin 3.2 (*)    All other components within normal limits  I-STAT CHEM 8, ED - Abnormal; Notable for the following components:   Glucose, Bld 123 (*)    Calcium, Ion 1.10 (*)    All other components within normal limits  CBG MONITORING, ED - Abnormal; Notable for the following components:   Glucose-Capillary 122 (*)    All other components within normal limits  PROTIME-INR  APTT  ETHANOL    EKG EKG Interpretation Date/Time:  Wednesday October 29 2023 16:29:47 EST Ventricular Rate:  78 PR Interval:  143 QRS Duration:  85 QT Interval:  433 QTC Calculation: 494 R Axis:   87  Text Interpretation: Sinus rhythm Abnormal T, consider ischemia, anterior leads similar to Sep 17 2023 Confirmed by Pricilla Loveless [phone]) on 10/29/2023 4:32:21 PM  Radiology MR BRAIN WO CONTRAST  Result Date: 10/29/2023 CLINICAL DATA:  Neuro deficit, acute, stroke suspected. Left-sided weakness. EXAM: MRI HEAD WITHOUT CONTRAST TECHNIQUE: Multiplanar, multiecho pulse sequences of the brain and surrounding structures were obtained without intravenous contrast. COMPARISON:  Head CT and CTA 10/29/2023 FINDINGS: The study is mildly motion degraded. Brain: There is no evidence of an acute infarct, intracranial hemorrhage, mass, midline shift, or extra-axial fluid collection. The ventricles and sulci are normal. The brain is normal in signal. Vascular: Major intracranial vascular flow voids are preserved. Skull and upper cervical spine: Unremarkable bone marrow signal. Sinuses/Orbits: Unremarkable orbits. Minimal mucosal thickening in the ethmoid sinuses. Clear mastoid air cells. Other: None. IMPRESSION: Negative head MRI. Electronically Signed   By: Sebastian Ache M.D.   On:  10/29/2023 16:27   CT ANGIO HEAD NECK W WO CM (CODE STROKE)  Result Date: 10/29/2023 CLINICAL DATA:  Neuro deficit, acute, stroke suspected EXAM: CT ANGIOGRAPHY HEAD AND NECK WITH AND WITHOUT CONTRAST TECHNIQUE: Multidetector CT imaging of the head and neck was performed using the standard protocol during bolus administration of intravenous contrast. Multiplanar CT image reconstructions and MIPs were obtained to evaluate the vascular anatomy. Carotid stenosis measurements (when applicable) are obtained utilizing NASCET criteria, using the distal internal carotid diameter as the denominator. RADIATION DOSE REDUCTION: This exam was performed according to the departmental dose-optimization program which includes automated exposure control, adjustment of the mA and/or kV according to patient size and/or use of iterative reconstruction technique. CONTRAST:  75mL OMNIPAQUE IOHEXOL 350 MG/ML SOLN COMPARISON:  Same day CT head FINDINGS: CTA NECK FINDINGS Aortic  arch: Aortic atherosclerosis. Right carotid system: Atherosclerosis at the carotid bifurcation without greater than 50% stenosis relative to the distal vessel Left carotid system: Severe left vertebral artery origin stenosis. Right dominant. Patent bilaterally. Vertebral arteries: Atherosclerosis Skeleton: No acute abnormality on limited assessment. Other neck: No acute abnormality on limited assessment. Upper chest: Extensive bullous emphysema in the lung apices. Review of the MIP images confirms the above findings CTA HEAD FINDINGS Anterior circulation: Bilateral intracranial ICAs, MCAs, it the ACAs are patent without proximally hemodynamically significant stenosis. Hypoplastic right A1 ACA, likely congenital. Posterior circulation: Bilateral intradural vertebral arteries, basilar artery and bilateral posterior cerebral arteries are patent without proximal hemodynamically significant stenosis. Venous sinuses: As permitted by contrast timing, patent. Review of  the MIP images confirms the above findings IMPRESSION: 1. No emergent large vessel occlusion. 2. Severe left vertebral artery origin stenosis. 3. Aortic Atherosclerosis (ICD10-I70.0) and Emphysema (ICD10-J43.9). Electronically Signed   By: Feliberto Harts M.D.   On: 10/29/2023 15:49   CT HEAD CODE STROKE WO CONTRAST  Result Date: 10/29/2023 CLINICAL DATA:  Code stroke. Neuro deficit, acute, stroke suspected. Provided history: Left-sided weakness. EXAM: CT HEAD WITHOUT CONTRAST TECHNIQUE: Contiguous axial images were obtained from the base of the skull through the vertex without intravenous contrast. RADIATION DOSE REDUCTION: This exam was performed according to the departmental dose-optimization program which includes automated exposure control, adjustment of the mA and/or kV according to patient size and/or use of iterative reconstruction technique. COMPARISON:  Head CT 11/28/2022. FINDINGS: Brain: Cerebral volume is normal. There is no acute intracranial hemorrhage. No demarcated cortical infarct. No extra-axial fluid collection. No evidence of an intracranial mass. No midline shift. Vascular: No hyperdense vessel.  Atherosclerotic calcifications. Skull: No calvarial fracture or aggressive osseous lesion. Sinuses/Orbits: No mass or acute finding within the imaged orbits. Trace mucosal thickening within the bilateral ethmoid sinuses. ASPECTS [facility] Stroke Program Early CT Score) - Ganglionic level infarction (caudate, lentiform nuclei, internal capsule, insula, M1-M3 cortex): 7 - Supraganglionic infarction (M4-M6 cortex): 3 Total score (0-10 with 10 being normal): 10 IMPRESSION: No evidence of an acute intracranial abnormality.  ASPECTS is 10. Electronically Signed   By: Jackey Loge D.O.   On: 10/29/2023 15:37    Procedures Procedures    Medications Ordered in ED Medications  sodium chloride flush (NS) 0.9 % injection 3 mL (has no administration in time range)  metoCLOPramide (REGLAN) injection  10 mg (has no administration in time range)  ketorolac (TORADOL) 15 MG/ML injection 15 mg (has no administration in time range)  diphenhydrAMINE (BENADRYL) injection 25 mg (has no administration in time range)  iohexol (OMNIPAQUE) 350 MG/ML injection 75 mL (75 mLs Intravenous Contrast Given 10/29/23 1536)    ED Course/ Medical Decision Making/ A&P                                 Medical Decision Making Amount and/or Complexity of Data Reviewed Labs: ordered.    Details: Unremarkable electrolytes Radiology: ordered and independent interpretation performed.    Details: No head bleed ECG/medicine tests: ordered and independent interpretation performed.    Details: Similar to prior  Risk Prescription drug management.   No head bleed seen on CT.  Due to what seems to be a mildly inconsistent exam, neuro has taken straight MRI which does not show an acute stroke on neuro read.  This seems to be unlikely to be stroke and so I talked to the  patient more and it seems like he was having a lot of sciatic pain in his leg which is a chronic problem but now it seems like he is having the slurred speech and arm symptoms.  Unclear if this is functional versus seizure.  Neuro is recommending treating pain and seeing if this improves his symptoms though will consider EEG if needed. Care transferred to Dr. Rush Landmark.        Final Clinical Impression(s) / ED Diagnoses Final diagnoses:  None    Rx / DC Orders ED Discharge Orders     None         Pricilla Loveless, MD 10/29/23 [phone]

## 2023-10-29 NOTE — ED Provider Notes (Signed)
4:40 PM Care assumed from Dr. Criss Alvine.  At time of transfer of care, patient waiting for reassessment after headache cocktail.  If symptoms have improved, anticipate discharge home, if not, will discuss with neurology again.  7:14 PM Patient reassessed and is well-appearing.  Neurology confirmed they feel patient is safer discharge home and does not need admission.  Patient agreed with plan for discharge home but he had questions for social work about how he can get his electric chair that is post delivered on Friday when he reportedly lives in a shelter that cannot take the delivery.  Will place a consult for transition of care to assist him and then he will be discharged.   Clinical Impression: 1. Left-sided weakness     Disposition: Discharge  Condition: Good  I have discussed the results, Dx and Tx plan with the pt(& family if present). He/she/they expressed understanding and agree(s) with the plan. Discharge instructions discussed at great length. Strict return precautions discussed and pt &/or family have verbalized understanding of the instructions. No further questions at time of discharge.    New Prescriptions   No medications on file    Follow Up: [facility] [address]     [address] [place] [address] [phone]       Curtis Hodge, Curtis Brim, MD 10/29/23 [phone]

## 2023-10-29 NOTE — Discharge Instructions (Addendum)
Your history, exam and workup today did not show evidence of acute stroke.  The neurology team felt you are safe for discharge home to follow-up with your outpatient PCP and we recommend follow with outpatient neurology as well.  Please rest and stay hydrated and continue on your home medications.  If any symptoms change or worsen acutely, return to the nearest emergency department.
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
## 2023-10-29 NOTE — ED Notes (Signed)
Patient currently eating and drinking using his left arm after he passed his swallow study

## 2023-10-29 NOTE — ED Notes (Signed)
Patient able to sit up in bed and states that his leg is cramping, he is able to move left arm and massage his left leg and A/Ox4. His speech is clear with this RN at this time

## 2023-11-21 ENCOUNTER — Other Ambulatory Visit: Payer: Self-pay

## 2023-11-21 ENCOUNTER — Encounter (HOSPITAL_COMMUNITY): Payer: Self-pay | Admitting: Emergency Medicine

## 2023-11-21 ENCOUNTER — Emergency Department (HOSPITAL_COMMUNITY): Payer: MEDICAID

## 2023-11-21 ENCOUNTER — Emergency Department (HOSPITAL_COMMUNITY)
Admission: EM | Admit: 2023-11-21 | Discharge: 2023-11-21 | Discharge status: Against Medical Advice | Payer: MEDICAID | Attending: Emergency Medicine | Admitting: Emergency Medicine

## 2023-11-21 DIAGNOSIS — R0789 Other chest pain: Secondary | ICD-10-CM | POA: Diagnosis not present

## 2023-11-21 DIAGNOSIS — R079 Chest pain, unspecified: Secondary | ICD-10-CM | POA: Diagnosis present

## 2023-11-21 DIAGNOSIS — Z5321 Procedure and treatment not carried out due to patient leaving prior to being seen by health care provider: Secondary | ICD-10-CM | POA: Diagnosis not present

## 2023-11-21 LAB — TROPONIN I (HIGH SENSITIVITY)
Troponin I (High Sensitivity): 10 ng/L (ref <18)
Troponin I (High Sensitivity): 9 ng/L (ref <18)

## 2023-11-21 LAB — CBC
HCT: 40.6 % (ref 39.0–52.0)
Hemoglobin: 13.4 g/dL (ref 13.0–17.0)
MCH: 32.2 pg (ref 26.0–34.0)
MCHC: 33 g/dL (ref 30.0–36.0)
MCV: 97.6 fL (ref 80.0–100.0)
Platelets: 152 10*3/uL (ref 150–400)
RBC: 4.16 MIL/uL — ABNORMAL LOW (ref 4.22–5.81)
RDW: 12.9 % (ref 11.5–15.5)
WBC: 3.9 10*3/uL — ABNORMAL LOW (ref 4.0–10.5)
nRBC: 0 % (ref 0.0–0.2)

## 2023-11-21 LAB — BASIC METABOLIC PANEL
Anion gap: 10 (ref 5–15)
BUN: 9 mg/dL (ref 6–20)
CO2: 25 mmol/L (ref 22–32)
Calcium: 9.1 mg/dL (ref 8.9–10.3)
Chloride: 102 mmol/L (ref 98–111)
Creatinine, Ser: 0.99 mg/dL (ref 0.61–1.24)
GFR, Estimated: >60 mL/min (ref >60)
Glucose, Bld: 118 mg/dL — ABNORMAL HIGH (ref 70–99)
Potassium: 3.8 mmol/L (ref 3.5–5.1)
Sodium: 137 mmol/L (ref 135–145)
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
## 2023-11-21 NOTE — ED Triage Notes (Signed)
 Pt c/o central chest pain that wraps around on the left side to his back. Pt states that the pain is worse with a deep breath and has been constant since yesterday.

## 2023-11-21 NOTE — ED Notes (Signed)
Pt. Called for room x3 w/ no response. Moved to [facility]

## 2023-12-03 ENCOUNTER — Other Ambulatory Visit: Payer: Self-pay

## 2023-12-03 ENCOUNTER — Ambulatory Visit: Payer: MEDICAID | Admitting: Family

## 2023-12-03 ENCOUNTER — Encounter: Payer: Self-pay | Admitting: Family

## 2023-12-03 ENCOUNTER — Ambulatory Visit (INDEPENDENT_AMBULATORY_CARE_PROVIDER_SITE_OTHER): Payer: MEDICAID

## 2023-12-03 DIAGNOSIS — M79604 Pain in right leg: Secondary | ICD-10-CM | POA: Diagnosis not present

## 2023-12-03 MED ORDER — OXYCODONE-ACETAMINOPHEN 5-325 MG PO TABS
1.0000 | ORAL_TABLET | Freq: Two times a day (BID) | ORAL | 0 refills | Status: AC | PRN
  Filled 2023-12-03: qty 10, 5d supply, fill #0
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
## 2023-12-20 ENCOUNTER — Encounter (HOSPITAL_COMMUNITY): Payer: Self-pay

## 2023-12-20 ENCOUNTER — Emergency Department (HOSPITAL_COMMUNITY): Payer: MEDICAID

## 2023-12-20 ENCOUNTER — Emergency Department (HOSPITAL_COMMUNITY)
Admission: EM | Admit: 2023-12-20 | Discharge: 2023-12-26 | Disposition: A | Discharge status: Home / Self Care | Payer: MEDICAID | Attending: Emergency Medicine | Admitting: Emergency Medicine

## 2023-12-20 ENCOUNTER — Other Ambulatory Visit: Payer: Self-pay

## 2023-12-20 DIAGNOSIS — S01311A Laceration without foreign body of right ear, initial encounter: Secondary | ICD-10-CM | POA: Diagnosis not present

## 2023-12-20 DIAGNOSIS — R079 Chest pain, unspecified: Secondary | ICD-10-CM | POA: Insufficient documentation

## 2023-12-20 DIAGNOSIS — S0990XA Unspecified injury of head, initial encounter: Secondary | ICD-10-CM | POA: Diagnosis present

## 2023-12-20 DIAGNOSIS — M542 Cervicalgia: Secondary | ICD-10-CM | POA: Insufficient documentation

## 2023-12-20 DIAGNOSIS — N179 Acute kidney failure, unspecified: Secondary | ICD-10-CM | POA: Insufficient documentation

## 2023-12-20 DIAGNOSIS — S01511A Laceration without foreign body of lip, initial encounter: Secondary | ICD-10-CM | POA: Insufficient documentation

## 2023-12-20 DIAGNOSIS — H539 Unspecified visual disturbance: Secondary | ICD-10-CM | POA: Insufficient documentation

## 2023-12-20 DIAGNOSIS — Z23 Encounter for immunization: Secondary | ICD-10-CM | POA: Insufficient documentation

## 2023-12-20 DIAGNOSIS — J189 Pneumonia, unspecified organism: Secondary | ICD-10-CM

## 2023-12-20 HISTORY — DX: Chronic obstructive pulmonary disease, unspecified: J44.9

## 2023-12-20 HISTORY — DX: Asymptomatic human immunodeficiency virus (hiv) infection status: Z21

## 2023-12-20 HISTORY — DX: Schizoaffective disorder, unspecified: F25.9

## 2023-12-20 HISTORY — DX: Depression, unspecified: F32.A

## 2023-12-20 HISTORY — DX: Other psychoactive substance use, unspecified, uncomplicated: F19.90

## 2023-12-20 HISTORY — DX: Essential (primary) hypertension: I10

## 2023-12-20 HISTORY — DX: Accidental discharge from unspecified firearms or gun, initial encounter: W34.00XA

## 2023-12-20 HISTORY — DX: Unspecified viral hepatitis B without hepatic coma: B19.10

## 2023-12-20 LAB — COMPREHENSIVE METABOLIC PANEL
ALT: 37 U/L (ref 0–44)
AST: 79 U/L — ABNORMAL HIGH (ref 15–41)
Albumin: 3.5 g/dL (ref 3.5–5.0)
Alkaline Phosphatase: 106 U/L (ref 38–126)
BUN: 14 mg/dL (ref 6–20)
CO2: <7 mmol/L — ABNORMAL LOW (ref 22–32)
Calcium: 9.1 mg/dL (ref 8.9–10.3)
Chloride: 99 mmol/L (ref 98–111)
Creatinine, Ser: 1.69 mg/dL — ABNORMAL HIGH (ref 0.61–1.24)
GFR, Estimated: 47 mL/min — ABNORMAL LOW (ref >60)
Glucose, Bld: 166 mg/dL — ABNORMAL HIGH (ref 70–99)
Potassium: 3.8 mmol/L (ref 3.5–5.1)
Sodium: 134 mmol/L — ABNORMAL LOW (ref 135–145)
Total Bilirubin: 0.7 mg/dL (ref 0.0–1.2)
Total Protein: 8.8 g/dL — ABNORMAL HIGH (ref 6.5–8.1)

## 2023-12-20 LAB — CBC WITH DIFFERENTIAL/PLATELET
Abs Immature Granulocytes: 0 10*3/uL (ref 0.00–0.07)
Basophils Absolute: 0.2 10*3/uL — ABNORMAL HIGH (ref 0.0–0.1)
Basophils Relative: 2 %
Eosinophils Absolute: 0 10*3/uL (ref 0.0–0.5)
Eosinophils Relative: 0 %
HCT: 46.9 % (ref 39.0–52.0)
Hemoglobin: 14.4 g/dL (ref 13.0–17.0)
Lymphocytes Relative: 45 %
Lymphs Abs: 3.8 10*3/uL (ref 0.7–4.0)
MCH: 32.1 pg (ref 26.0–34.0)
MCHC: 30.7 g/dL (ref 30.0–36.0)
MCV: 104.5 fL — ABNORMAL HIGH (ref 80.0–100.0)
Monocytes Absolute: 0.4 10*3/uL (ref 0.1–1.0)
Monocytes Relative: 5 %
Neutro Abs: 4.1 10*3/uL (ref 1.7–7.7)
Neutrophils Relative %: 48 %
Platelets: 172 10*3/uL (ref 150–400)
RBC: 4.49 MIL/uL (ref 4.22–5.81)
RDW: 12.1 % (ref 11.5–15.5)
WBC: 8.5 10*3/uL (ref 4.0–10.5)
nRBC: 0 % (ref 0.0–0.2)
nRBC: 0 /100{WBCs}

## 2023-12-20 LAB — I-STAT CG4 LACTIC ACID, ED
Lactic Acid, Venous: >15 mmol/L (ref 0.5–1.9)
Lactic Acid, Venous: 2 mmol/L (ref 0.5–1.9)

## 2023-12-20 LAB — ETHANOL: Alcohol, Ethyl (B): <10 mg/dL (ref <10)

## 2023-12-20 MED ORDER — MORPHINE SULFATE (PF) 4 MG/ML IV SOLN
4.0000 mg | Freq: Once | INTRAVENOUS | Status: AC
  Administered 2023-12-20: 4 mg via INTRAVENOUS
  Filled 2023-12-20: qty 1

## 2023-12-20 MED ORDER — LACTATED RINGERS IV BOLUS
1000.0000 mL | Freq: Once | INTRAVENOUS | Status: AC
  Administered 2023-12-20: 1000 mL via INTRAVENOUS

## 2023-12-20 MED ORDER — TETANUS-DIPHTH-ACELL PERTUSSIS 5-2.5-18.5 LF-MCG/0.5 IM SUSY
0.5000 mL | PREFILLED_SYRINGE | Freq: Once | INTRAMUSCULAR | Status: AC
  Administered 2023-12-20: 0.5 mL via INTRAMUSCULAR
  Filled 2023-12-20: qty 0.5

## 2023-12-20 MED ORDER — ONDANSETRON HCL 4 MG/2ML IJ SOLN
4.0000 mg | Freq: Once | INTRAMUSCULAR | Status: AC
  Administered 2023-12-20: 4 mg via INTRAVENOUS
  Filled 2023-12-20: qty 2

## 2023-12-21 ENCOUNTER — Emergency Department (HOSPITAL_COMMUNITY): Payer: MEDICAID

## 2023-12-21 LAB — I-STAT CG4 LACTIC ACID, ED: Lactic Acid, Venous: 1.9 mmol/L (ref 0.5–1.9)

## 2023-12-21 MED ORDER — HYDROCODONE-ACETAMINOPHEN 5-325 MG PO TABS
1.0000 | ORAL_TABLET | Freq: Once | ORAL | Status: AC
  Administered 2023-12-21: 1 via ORAL
  Filled 2023-12-21: qty 1

## 2023-12-21 MED ORDER — OXYCODONE-ACETAMINOPHEN 5-325 MG PO TABS
1.0000 | ORAL_TABLET | Freq: Four times a day (QID) | ORAL | Status: DC | PRN
  Administered 2023-12-21 – 2023-12-25 (×8): 1 via ORAL
  Filled 2023-12-21 (×8): qty 1

## 2023-12-21 MED ORDER — OXYCODONE-ACETAMINOPHEN 5-325 MG PO TABS
1.0000 | ORAL_TABLET | Freq: Once | ORAL | Status: AC
  Administered 2023-12-21: 1 via ORAL
  Filled 2023-12-21: qty 1

## 2023-12-21 MED ORDER — IBUPROFEN 600 MG PO TABS: 600.0000 mg | ORAL_TABLET | Freq: Four times a day (QID) | ORAL | 0 refills | Status: DC | PRN

## 2023-12-21 MED ORDER — AMOXICILLIN-POT CLAVULANATE 875-125 MG PO TABS
1.0000 | ORAL_TABLET | Freq: Two times a day (BID) | ORAL | Status: DC
  Administered 2023-12-21 – 2023-12-25 (×9): 1 via ORAL
  Filled 2023-12-21 (×9): qty 1

## 2023-12-21 MED ORDER — AMOXICILLIN-POT CLAVULANATE 875-125 MG PO TABS: 1.0000 | ORAL_TABLET | Freq: Two times a day (BID) | ORAL | 0 refills | Status: DC

## 2023-12-21 MED ORDER — OXYCODONE-ACETAMINOPHEN 5-325 MG PO TABS
1.0000 | ORAL_TABLET | Freq: Three times a day (TID) | ORAL | 0 refills | Status: DC | PRN
  Filled 2023-12-21: qty 15, 5d supply, fill #0

## 2023-12-21 MED ORDER — ALBUTEROL SULFATE HFA 108 (90 BASE) MCG/ACT IN AERS
2.0000 | INHALATION_SPRAY | Freq: Four times a day (QID) | RESPIRATORY_TRACT | Status: DC | PRN
  Administered 2023-12-22 – 2023-12-25 (×2): 2 via RESPIRATORY_TRACT
  Filled 2023-12-21: qty 6.7

## 2023-12-21 MED ORDER — AMOXICILLIN-POT CLAVULANATE 875-125 MG PO TABS
1.0000 | ORAL_TABLET | Freq: Two times a day (BID) | ORAL | 0 refills | Status: DC
  Filled 2023-12-21: qty 14, 7d supply, fill #0

## 2023-12-21 MED ORDER — IBUPROFEN 400 MG PO TABS
600.0000 mg | ORAL_TABLET | Freq: Three times a day (TID) | ORAL | Status: DC | PRN
  Administered 2023-12-23 – 2023-12-25 (×2): 600 mg via ORAL
  Filled 2023-12-21 (×2): qty 1

## 2023-12-21 MED ORDER — HYDROMORPHONE HCL 1 MG/ML IJ SOLN: 1.0000 mg | Freq: Once | INTRAMUSCULAR | Status: DC

## 2023-12-21 MED ORDER — IPRATROPIUM-ALBUTEROL 0.5-2.5 (3) MG/3ML IN SOLN
3.0000 mL | Freq: Once | RESPIRATORY_TRACT | Status: AC
  Administered 2023-12-21: 3 mL via RESPIRATORY_TRACT
  Filled 2023-12-21: qty 3

## 2023-12-21 MED ORDER — IBUPROFEN 600 MG PO TABS
600.0000 mg | ORAL_TABLET | Freq: Four times a day (QID) | ORAL | 0 refills | Status: DC | PRN
  Filled 2023-12-21: qty 30, 8d supply, fill #0

## 2023-12-21 MED ORDER — IOHEXOL 350 MG/ML SOLN
75.0000 mL | Freq: Once | INTRAVENOUS | Status: AC | PRN
  Administered 2023-12-21: 75 mL via INTRAVENOUS

## 2023-12-21 MED ORDER — OXYCODONE-ACETAMINOPHEN 5-325 MG PO TABS: 1.0000 | ORAL_TABLET | Freq: Three times a day (TID) | ORAL | 0 refills | Status: DC | PRN

## 2023-12-21 MED ORDER — GADOBUTROL 1 MMOL/ML IV SOLN
10.0000 mL | Freq: Once | INTRAVENOUS | Status: AC | PRN
  Administered 2023-12-21: 10 mL via INTRAVENOUS

## 2023-12-21 MED ORDER — AMOXICILLIN-POT CLAVULANATE 875-125 MG PO TABS
1.0000 | ORAL_TABLET | Freq: Once | ORAL | Status: AC
  Administered 2023-12-21: 1 via ORAL
  Filled 2023-12-21: qty 1

## 2023-12-22 ENCOUNTER — Other Ambulatory Visit: Payer: Self-pay
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
## 2023-12-24 ENCOUNTER — Other Ambulatory Visit: Payer: Self-pay

## 2023-12-24 ENCOUNTER — Telehealth: Payer: Self-pay

## 2023-12-24 MED ORDER — GABAPENTIN 300 MG PO CAPS
300.0000 mg | ORAL_CAPSULE | Freq: Three times a day (TID) | ORAL | 0 refills | Status: AC
  Filled 2023-12-24 – 2023-12-25 (×2): qty 42, 14d supply, fill #0

## 2023-12-24 MED ORDER — BICTEGRAVIR-EMTRICITAB-TENOFOV 50-200-25 MG PO TABS
1.0000 | ORAL_TABLET | Freq: Every day | ORAL | 0 refills | Status: DC
  Filled 2023-12-24: qty 14, 14d supply, fill #0
  Filled 2023-12-24: qty 30, 30d supply, fill #0

## 2023-12-24 MED ORDER — PANTOPRAZOLE SODIUM 40 MG PO TBEC
40.0000 mg | DELAYED_RELEASE_TABLET | Freq: Every day | ORAL | 0 refills | Status: DC
  Filled 2023-12-24: qty 14, 14d supply, fill #0

## 2023-12-24 MED ORDER — OXYCODONE-ACETAMINOPHEN 5-325 MG PO TABS
1.0000 | ORAL_TABLET | Freq: Three times a day (TID) | ORAL | 0 refills | Status: AC | PRN
  Filled 2023-12-24 – 2023-12-25 (×3): qty 8, 3d supply, fill #0

## 2023-12-24 MED ORDER — IBUPROFEN 600 MG PO TABS
600.0000 mg | ORAL_TABLET | Freq: Four times a day (QID) | ORAL | 0 refills | Status: AC | PRN
  Filled 2023-12-24 – 2023-12-25 (×2): qty 30, 8d supply, fill #0

## 2023-12-24 MED ORDER — ATORVASTATIN CALCIUM 40 MG PO TABS
40.0000 mg | ORAL_TABLET | Freq: Every day | ORAL | 0 refills | Status: AC
  Filled 2023-12-24 – 2023-12-25 (×2): qty 14, 14d supply, fill #0

## 2023-12-24 MED ORDER — AMOXICILLIN-POT CLAVULANATE 875-125 MG PO TABS
1.0000 | ORAL_TABLET | Freq: Two times a day (BID) | ORAL | 0 refills | Status: AC
  Filled 2023-12-24 – 2023-12-25 (×3): qty 8, 4d supply, fill #0

## 2023-12-24 MED ORDER — ATORVASTATIN CALCIUM 40 MG PO TABS
40.0000 mg | ORAL_TABLET | Freq: Every day | ORAL | 0 refills | Status: DC
  Filled 2023-12-24: qty 14, 14d supply, fill #0

## 2023-12-24 MED ORDER — BICTEGRAVIR-EMTRICITAB-TENOFOV 50-200-25 MG PO TABS
1.0000 | ORAL_TABLET | Freq: Every day | ORAL | 0 refills | Status: AC
  Filled 2023-12-24: qty 30, 30d supply, fill #0

## 2023-12-24 MED ORDER — PANTOPRAZOLE SODIUM 40 MG PO TBEC
40.0000 mg | DELAYED_RELEASE_TABLET | Freq: Every day | ORAL | 0 refills | Status: AC
  Filled 2023-12-24 – 2023-12-25 (×2): qty 14, 14d supply, fill #0

## 2023-12-24 MED ORDER — GABAPENTIN 300 MG PO CAPS
300.0000 mg | ORAL_CAPSULE | Freq: Three times a day (TID) | ORAL | 0 refills | Status: DC
  Filled 2023-12-24: qty 42, 14d supply, fill #0

## 2023-12-24 MED ORDER — SULFAMETHOXAZOLE-TRIMETHOPRIM 800-160 MG PO TABS
1.0000 | ORAL_TABLET | Freq: Every day | ORAL | Status: DC
  Administered 2023-12-24 – 2023-12-25 (×2): 1 via ORAL
  Filled 2023-12-24 (×2): qty 1

## 2023-12-24 MED ADMIN — Bictegravir-Emtricitabine-Tenofovir AF Tab 50-200-25 MG: 1 | ORAL | NDC 61958250101

## 2023-12-24 MED FILL — Bictegravir-Emtricitabine-Tenofovir AF Tab 50-200-25 MG: 1.0000 | ORAL | Qty: 1 | Status: AC

## 2023-12-24 NOTE — Telephone Encounter (Signed)
 Received call from [facility] with [facility] [phone]. Slater reports the pharmacy is unable to provide quantity of 14. Jane request a new rx with #30 for medication listed below.  Notified MD of request.  bictegravir-emtricitabine -tenofovir  AF (BIKTARVY ) 50-200-25 MG TABS tablet  - 5:18pm received approval from Dr. Pamella to provide verbal for the above listed medication. Will follow up with pharmacy.   TOC following

## 2023-12-24 NOTE — Telephone Encounter (Signed)
 This RNCM spoke with Curtis Hodge with [facility] pharmacy [phone], to provide verbal for bictegravir-emtricitabine -tenofovir  AF (BIKTARVY ) 50-200-25 MG TABS tablet, #30.   No additional TOC need at this time.
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
## 2023-12-24 NOTE — ED Notes (Signed)
 Patient awake and alert this morning, no s/s of distress, ambulated to the restroom, ordered him an additional breakfast tray per his request, will continue to monitor.

## 2023-12-24 NOTE — Discharge Planning (Signed)
 RNCM contacted [employer] to request them retrieving pt wheelchair.  RNCM was advised that pt will need to call and set up meeting place close by address and they will escort him and allow him to retrieve his things.  RNCM communicated information to the team.  Indianna Boran J. Debarah, RN, BSN, NCM  Transitions of Care  Nurse Case Manager  [facility]  Operative Services  [phone]

## 2023-12-24 NOTE — Discharge Planning (Signed)
 RNCM met with pt at bedside regarding  the instructions on how to get his wheelchair back.  He says he will not do that, he will just do without.  He is requesting to be sent to [facility] at this time.

## 2023-12-25 ENCOUNTER — Other Ambulatory Visit (HOSPITAL_COMMUNITY): Payer: Self-pay

## 2023-12-25 ENCOUNTER — Other Ambulatory Visit: Payer: Self-pay

## 2023-12-25 MED ADMIN — Bictegravir-Emtricitabine-Tenofovir AF Tab 50-200-25 MG: 1 | ORAL | NDC 61958250101

## 2023-12-25 NOTE — Discharge Planning (Signed)
 RNCM secured Rx for discharge; they are being held in blue box in [facility] [phone]).  When pt is ready for discharge please retrieve at that time.  If it is after [facility] is closed, bedside may request ED Pharmacist [phone]) to pick up in [facility].

## 2023-12-25 NOTE — ED Notes (Signed)
 Spoke w/ Moira Andrews from [facility] who says pt is to arrive tomorrow between 0745 and 0800. Address [address] [place], [place] [address]. Pt meds are in [facility] pharmacy at this time.
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
## 2023-12-26 NOTE — ED Notes (Signed)
 Reviewed discharge instructions with patient. Follow-up care and medications reviewed. Patient verbalized understanding. Patient A&Ox4, VSS, and ambulatory with cane upon discharge.

## 2023-12-29 ENCOUNTER — Encounter: Payer: Self-pay | Admitting: Family

## 2024-01-09 ENCOUNTER — Telehealth: Payer: Self-pay

## 2024-01-09 NOTE — Telephone Encounter (Addendum)
Patient is asking that medication be sent to pharmacy in [place]. Curtis Hodge patient would need to call pharmacy that he is requesting prescriptions to be sent to and have them call to carry over those refills.   Medication was not prescribed by PCP.   Curtis Hodge to have patient return call.   He may need OV for prosthetic sleeve or follow with Ortho. Will forward for advice.
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
## 2024-01-09 NOTE — Telephone Encounter (Signed)
Copied from CRM [id number]. Topic: General - Call Back - No Documentation >> Jan 09, 2024  2:39 PM Ivette P wrote: Reason for CRM: PT calling in returning a call from [name] Franco, RN. Called CAL to Connect, advised [facility] Pen was busy. Pt requesting callback [phone]

## 2024-01-09 NOTE — Telephone Encounter (Signed)
Copied from CRM [id number]. Topic: Clinical - Prescription Issue >> Jan 09, 2024  3:51 PM Franchot Heidelberg wrote: Reason for CRM: Pt called reporting that he needs all of his medications transferred to [facility] in [id number] [place], [place] - [address] AT [facility] OF [address] [phone] [address] [place] [address]

## 2024-01-09 NOTE — Telephone Encounter (Signed)
Returned call to patient.  Unable to reach. Mailbox is full. Calling back regarding previous encounter:  Patient is asking that medication be sent to pharmacy in [place]. Rennie Plowman patient would need to call pharmacy that he is requesting prescriptions to be sent to and have them call to carry over those refills.   Medication was not prescribed by PCP.   Rennie Plowman to have patient return call.   He may need OV for prosthetic sleeve or follow with Ortho. Will forward for advice.

## 2024-01-09 NOTE — Telephone Encounter (Signed)
Call to patient to advised that he would need to call the pharmacy([facility] in [facility]) and ask that they call his pervious pharmacy and have his medication transferred to the [facility]. Unable to reach VM left.

## 2024-02-17 ENCOUNTER — Telehealth: Payer: Self-pay | Admitting: Orthopedic Surgery
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
## 2024-02-18 NOTE — Telephone Encounter (Signed)
 Called pt to inform him and he did not answer.

## 2024-02-18 NOTE — Telephone Encounter (Signed)
 [facility] to [facility]

## 2024-02-20 ENCOUNTER — Other Ambulatory Visit: Payer: Self-pay

## 2024-02-26 ENCOUNTER — Other Ambulatory Visit: Payer: Self-pay

## 2024-02-26 MED ORDER — NYSTATIN 100000 UNIT/ML MT SUSP
5.0000 mL | Freq: Four times a day (QID) | OROMUCOSAL | 0 refills | Status: AC
  Filled 2024-02-26: qty 140, 7d supply, fill #0

## 2024-02-26 MED ORDER — MAGNESIUM OXIDE 400 MG PO TABS
400.0000 mg | ORAL_TABLET | Freq: Two times a day (BID) | ORAL | 0 refills | Status: AC
  Filled 2024-02-26: qty 60, 30d supply, fill #0

## 2024-03-01 ENCOUNTER — Telehealth: Payer: Self-pay | Admitting: *Deleted

## 2024-03-01 ENCOUNTER — Encounter: Payer: Self-pay | Admitting: *Deleted

## 2024-03-01 NOTE — Transitions of Care (Post Inpatient/ED Visit) (Signed)
   03/01/2024  Name: Sreekar Broyhill MRN: [id number] DOB: [birth year]  Today's TOC FU Call Status: Today's TOC FU Call Status:: Unsuccessful Call (1st Attempt) Unsuccessful Call (1st Attempt) Date: 03/01/24  Attempted to reach the patient regarding the most recent Inpatient/ED visit.  Follow Up Plan: Additional outreach attempts will be made to reach the patient to complete the Transitions of Care (Post Inpatient/ED visit) call.   Arna Better RN, BSN [place]  [facility] RN Care Manager [phone]

## 2024-03-01 NOTE — Telephone Encounter (Signed)
 This encounter was created in error - please disregard.

## 2024-03-02 ENCOUNTER — Telehealth: Payer: Self-pay | Admitting: *Deleted

## 2024-03-02 NOTE — Transitions of Care (Post Inpatient/ED Visit) (Signed)
   03/02/2024  Name: Zell Hylton MRN: [id number] DOB: 1966/05/17  Today's TOC FU Call Status: Today's TOC FU Call Status:: Unsuccessful Call (2nd Attempt) Unsuccessful Call (2nd Attempt) Date: 03/02/24  Attempted to reach the patient regarding the most recent Inpatient/ED visit. A detailed HIPAA compliant message was left, including RNCM contact information.  Follow Up Plan: Additional outreach attempts will be made to reach the patient to complete the Transitions of Care (Post Inpatient/ED visit) call.   Arna Better RN, BSN [place]  [facility] Health RN Care Manager [phone]

## 2024-03-03 ENCOUNTER — Telehealth: Payer: Self-pay | Admitting: *Deleted

## 2024-03-03 NOTE — Transitions of Care (Post Inpatient/ED Visit) (Signed)
   03/03/2024  Name: Curtis Hodge MRN: [id number] DOB: 11/17/1966  Today's TOC FU Call Status: Today's TOC FU Call Status:: Unsuccessful Call (3rd Attempt) Unsuccessful Call (3rd Attempt) Date: 03/03/24  Attempted to reach the patient regarding the most recent Inpatient/ED visit.  Follow Up Plan: No further outreach attempts will be made at this time. We have been unable to contact the patient.  Arna Better RN, BSN [place]  [facility] RN Care Manager [phone]

## 2024-03-08 ENCOUNTER — Other Ambulatory Visit: Payer: Self-pay

## 2024-03-25 ENCOUNTER — Telehealth: Payer: Self-pay

## 2024-03-25 NOTE — Telephone Encounter (Signed)
 Patient considered out of care.  Last [facility] Visit: 02-13-23  Last HIV Viral Load:  HIV 1 RNA Quant  Date Value Ref Range Status  09/18/2023 24,300 copies/mL Corrected    Comment:    (NOTE) The reportable range for this assay is 20 to 10,000,000 copies HIV-1 RNA/mL.     Last CD4 Count:  CD4 T Cell Abs  Date Value Ref Range Status  09/18/2023 190 (L) 400 - 1,790 /uL Final    Medication Dispense History:   Dispensed Days Supply Quantity Provider Pharmacy  bictegravir-emtricitabine -tenofovir  AF (BIKTARVY ) 50-200-25 MG TABS tablet 12/25/2023 30 30 tablet Mozell Arias, MD [facility] Trans[facility]...  bictegravir-emtricitabine -tenofovir  AF (BIKTARVY ) 50-200-25 MG TABS tablet 09/19/2023 30 30 tablet Orlie Bjornstad, MD [facility] Transitions...  bictegravir-emtricitabine -tenofovir  AF (BIKTARVY ) 50-200-25 MG TABS tablet 05/21/2023 30 30 tablet Beverly, Angela M, PA-C [place] [facility]...    Interventions: Baby Bolt to schedule follow up appointment, no answer. Left HIPAA compliant voicemail requesting callback.  Will try reaching via MyChart.   He was hospitalized last month at [facility].   Duration of Services: 10 minutes  Genaro Bekker, BSN, Charity fundraiser

## 2024-04-16 NOTE — Progress Notes (Signed)
 The 10-year ASCVD risk score (Arnett DK, et al., 2019) is: 9.3%   Values used to calculate the score:     Age: 58 years     Sex: Male     Is Non-Hispanic African American: Yes     Diabetic: No     Tobacco smoker: Yes     Systolic Blood Pressure: 113 mmHg     Is BP treated: No     HDL Cholesterol: 46 mg/dL     Total Cholesterol: 147 mg/dL  Currently prescribed atorvastatin  40 mg.  Saahas Hidrogo, BSN, RN

## 2024-04-21 NOTE — Progress Notes (Signed)
 Patient out of care with [facility]. Tried calling number on file, no answer. Left HIPAA compliant voicemail requesting callback.   Of note, [facility] is also trying to get in touch with him as he may be transferring his care there. They were unable to reach him for intake appointment, he is scheduled to see a provider there on 6/20.   Will continue to follow to see if patient is able to re-establish care with [facility].   Rafeal Skibicki, BSN, RN

## 2024-05-26 ENCOUNTER — Telehealth: Payer: Self-pay

## 2024-05-26 NOTE — Telephone Encounter (Signed)
 Attempted to call [facility] for appointment, number is not in service.   Patient had appointment with [facility] on 6/20, but did not make it.   Found additional contact info for [facility] in [place]:  [phone] [facility]) [phone] (Mobile)   Call could not be completed on home number.   Left HIPAA compliant voicemail requesting callback on mobile number.   Seana Underwood, BSN, RN

## 2024-05-29 NOTE — Discharge Summary (Signed)
 HOSPITAL MEDICINE -  DISCHARGE SUMMARY   Demographics: Curtis Hodge  58 y.o. Mar 15, 1966 MRN: [id number]    Extended Emergency Contact Information Primary Emergency Contact: maberson,william Mobile Phone: [phone] Relation: Friend  Full Code  Admit Date: 05/25/2024                     Attending Physician: Derryl MARLA Course, MD  Discharge Date: 05/29/2024  Primary Care Provider: Corrina Sabin, MD   [phone]  Consults during this admission: Consult Orders             IP CONSULT TO ENT       Specialty:  Otolaryngology  Provider:  (Not yet assigned)      IP CONSULT TO INFECTIOUS DISEASES       Specialty:  Infectious Diseases  Provider:  (Not yet assigned)      IP CONSULT TO HOSPITALIST       Provider:  (Not yet assigned)              Active & Resolved Diagnosis: Principal Problem (Resolved):   Facial cellulitis Active Problems:   HIV (human immunodeficiency virus infection)    (CMD)   Immunocompromised state (CMD)    Hospital Course:  Curtis Hodge is a 58 y.o. male with a PMH significant for HFpEF, COPD, HIV, polysubstance use disorder (amphetamines, cocaine, fentanyl , marijuana) who presents for evaluation of right lower face/lip swelling and pain.   Reported removing ingrown chin hairs approximately 3 days ago, which was followed by progressive swelling and erythema over the area.  Reports initially purulent drainage that has stopped.  Has associated pain.  No fevers or chills.   Not adherent to Biktarvy  for approximately 3 weeks due to socioeconomic constraints with inability to get to the pharmacy.  Patient stated that he is staying in [facility] with better living situation.  Stated that he is currently utilizing rehab/assistance resources provided during last admission.  At times relapses, used crack cocaine yesterday but has been abstinent to IV drugs   Had an admission in April/2025 for NSTEMI secondary to drug use, was treated  symptomatically for oral lesions that were HSV 1 and 2 negative, VZV negative.  ID was consulted, at that time he was continued on Biktarvy  for HIV.  His CD4 was 264 with VL 46.   ED course: Temp 99.7, HR 79, RR 18, BP 138/85, SpO2 97% room air WBC 6.3, Hb 14.4, PLT 155 NA 133, K3.8, BUN 16, creatinine 0.86 Lactic nonelevated   CT facial bones W obtained and showed soft tissue swelling with stranding of the right lower lip/face concerning for cellulitis.  Superimposed 2.4 x 1.1 x 1.6 cm hypodense region within this area suspicious for an abscess.   Received Unasyn  in the ED, 1 L of LR, morphine   Inpatient course: Patient was started on broad-spectrum antibiotics, after consulting with ID Patient also started on Biktarvy  for his underlying HIV Patient noted progressive swelling, and ENT was consulted, patient underwent I&D of the lip swelling on 7/10, with finding of significant amount of pus Wound packing was done Packing was removed, repeated on 7/11 With significant resolution of the swelling Patient able to open mouth, monitoring more comfortably  Patient continues IV antibiotics while inpatient, blood cultures of MRSA, ID advised for discharge on Augmentin , doxycycline   On 7/12, patient had removal of the wound packing, and reports significant improvement Patient is able to tolerate p.o. adequately, and pain controlled with pain medication  Patient requesting  early discharge on this day, as he has to reach his place before his brother lives. Patient reports no acute overnight issues Patient being discharged home with oral Augmentin , doxycycline , to complete 7 more days  He will continue with Biktarvy  Adequate refills have been prescribed to avoid lapse in treatment Patient has been adequately counseled to follow-up with HIV counselor, ID, addiction specialist as outpatient An appointment has already been obtained for him with addiction specialist  Patient has been adequately  counseled on local wound care, importance of adherence to medications, and need for regular follow-up  Patient says that he now has a phone, and is going to stay in [facility], and will be able to continue with appropriate follow-ups  Is being discharged in stable condition     Pertinent medical issues summarized as assessment and plan as below-  Facial cellulitis- lower lip Immunocompromised state  - Reported ingrown hair/folliculitis that was removed with tweezers, subsequently had progressive swelling with pain  - CT with Superimposed 2.4 x 1.1 x 1.6 cm hypodense region within this area suspicious for an abscess  -  Did get a dose of zosyn  in ED - will transition to Rocephin  daily with addition of Bactrim  given reported purulence and abscess - Supportive care with analgesics -given hx of HIV will request ID to review recommend further treatment plans   -7/10-patient antibiotics have been broadened by ID to vancomycin , Zosyn  - Appreciate ID involvement - Will plan to scan his lower lip, with ultrasound to see if the abscess is better delineated - Request ENT review to see if this is amenable for I&D, and ENT has advised a repeat CT with contrast to assess.   7/11- InD of abscess by ENT on 7/10 Improved swelling Scant discharge Plans to pack wound again today   HIV (human immunodeficiency virus infection) - CD4 264, VL 46 in 4/25 - Has not taken Biktarvy  in 3 weeks due to inability to pick it up at his previous pharmacy.   - currently living in [place] - will restart Biktarvy , obtain CD4, VL - will request ID review and ensure outpatient follow up   -7/10 ID following, appreciate recommendations   Hypertension - Start patient on amlodipine 10, Lopressor  12.5 every 6 hr Continue to monitor blood pressure   7/10- 153/81   Substance use disorder - Attempting to quit; counseled on further cessation; utilizing resources provided on last admission    Disposition: Patient  discharged to Home in stable condition.   Discharge follow-up recommendations : See Hospital Course  Predictive Model Details       23% (Medium) Factor Value   Risk of Unplanned Readmission Model Number of ED visits in last six months 6    Diagnosis of drug abuse present    Number of active inpatient medication orders 22    ECG/EKG order present in last 6 months    Imaging order present in last 6 months    Latest hemoglobin low (13.4 g/dL)    Number of hospitalizations in last year 1    Charlson Comorbidity Index 4    Age 66    Active anticoagulant inpatient medication order present    Current length of stay 3.236 days    Future appointment scheduled      Scheduled Future Appointments       Provider Department Dept Phone Center   06/23/2024 3:00 PM Erin Benton Das [facility]  - Infectious Disease Specialty [facility] [phone]  Wound / Incision Assessment: Refer to Chart Review and Media Tab for images if available.      Temp:  [97.6 F (36.4 C)-98.2 F (36.8 C)] 97.6 F (36.4 C) Heart Rate:  [69-83] 83 Resp:  [18-20] 18 BP: (125-150)/(58-79) 141/79      Discharge Medications     New Medications      Sig Disp Refill Start End  amLODIPine 10 mg tablet Commonly known as: NORVASC  Take 1 tablet (10 mg total) by mouth daily.  90 tablet  0  May 30, 2024    amoxicillin -pot clavulanate 875-125 mg per tablet Commonly known as: AUGMENTIN   Take 1 tablet by mouth 2 (two) times a day for 7 days.  14 tablet  0     doxycycline  100 mg tablet Commonly known as: VIBRA -TABS  Take 1 tablet (100 mg total) by mouth 2 (two) times a day for 7 days. Take with 8 oz water. Do not lie down for at least 30 minutes after.  14 tablet  0     metoprolol  succinate 50 mg 24 hr tablet Commonly known as: TOPROL  XL  Take 1 tablet (50 mg total) by mouth daily.  90 tablet  0     multivitamin with minerals  Take 1 tablet by  mouth daily.  90 tablet  0  May 30, 2024    oxyCODONE  10 mg Tab Commonly known as: ROXICODONE   Take 1 tablet (10 mg total) by mouth every 4 (four) hours as needed for severe pain (7-10).  10 tablet  0         Modified Medications      Sig Disp Refill Start End  acetaminophen  500 mg tablet Commonly known as: TYLENOL  What changed:  when to take this reasons to take this  Take 2 tablets (1,000 mg total) by mouth every 8 (eight) hours as needed for fever 100.4 F or GREATER, mild pain (1-3) or headaches (Fever GREATER THAN 100.4 F(38 C)).  30 tablet  0         Medications To Continue      Sig Disp Refill Start End  albuterol  HFA 90 mcg/actuation inhaler Commonly known as: PROVENTIL  HFA;VENTOLIN  HFA;PROAIR  HFA  Inhale 1-2 puffs every 6 (six) hours as needed for wheezing or shortness of breath.   0     Biktarvy  50-200-25 mg Tab per tablet Generic drug: bictegrav-emtricit-tenofov ala  Take 1 tablet by mouth daily.  90 tablet  3         Stopped Medications    nystatin  100,000 unit/mL suspension Commonly known as: MYCOSTATIN        Discharge Orders     Ambulatory referral to Infectious Disease     Comments: HIV patient, has previously been lost to follow up   Ambulatory referral to PCP     Full Code     Lifting Limits:     Details:    Lifting Limits: No lifting limits         Lab Results  Component Value Date/Time   HGB 13.4 (L) 05/29/2024 01:22 AM   HCT 38.6 (L) 05/29/2024 01:22 AM   WBC 2.74 (L) 05/29/2024 01:22 AM   PLT 129 (L) 05/29/2024 01:22 AM   Lab Results  Component Value Date/Time   NA 136 05/29/2024 01:22 AM   K 4.7 (H) 05/29/2024 01:22 AM   CREATININE 0.85 05/29/2024 01:22 AM   BUN 12 05/29/2024 01:22 AM   GLUCOSE 98 05/29/2024 01:22 AM    Pertinent  Imaging: CT Facial Bones W Contrast  Final Result by Delmar Herbert Winfred Booker Results In [place] [id number] (07/09 9951)  CLINICAL DATA:  Initial evaluation for acute right-sided facial  swelling.     EXAM:  CT MAXILLOFACIAL WITH CONTRAST    TECHNIQUE:  Multidetector CT imaging of the maxillofacial structures was  performed with intravenous contrast. Multiplanar CT image  reconstructions were also generated.    RADIATION DOSE REDUCTION: This exam was performed according to the  departmental dose-optimization program which includes automated  exposure control, adjustment of the mA and/or kV according to  patient size and/or use of iterative reconstruction technique.    CONTRAST:  80 mL iohexoL  (OMNIPAQUE ) 350 mg iodine /mL injection  (MDV) 80 mL    COMPARISON:  None Available.    FINDINGS:  Osseous: No acute osseous abnormality. No discrete or worrisome  osseous lesions.    Orbits: Globes and orbital soft tissues within normal limits.    Sinuses: Paranasal sinuses are largely clear. Mastoid air cells and  middle ear cavities are well pneumatized and free of fluid.    Soft tissues: Soft tissue swelling with hazy inflammatory stranding  seen involving the soft tissues of the right lower left, concerning  for infection/cellulitis. Superimposed heterogeneous hypodense area  within this region measuring 2.4 x 1.1 x 1.6 cm, suspicious for  abscess (series 2, image 64). This is positioned within the  subcutaneous soft tissues. No other drainable fluid collections.  Inflammatory process is largely confined to the superficial soft  tissues of the face, with no extension into the deeper spaces of the  face/neck at this time.    Limited intracranial: Unremarkable.    IMPRESSION:  Soft tissue swelling with hazy inflammatory stranding involving the  soft tissues of the right lower lip/face, concerning for  infection/cellulitis. Superimposed 2.4 x 1.1 x 1.6 cm hypodense  region within this area, suspicious for abscess. No extension into  the deeper spaces of the face/neck at this time.      Electronically Signed    By: Morene Hoard M.D.    On: 05/26/2024 00:48         Time spent on discharge: >30 minutes  Electronically signed by Derryl MARLA Course, MD 05/29/24 9:58 AM  This document was created using the aid of voice recognition Dragon dictation software.  Chances of interpretation errors are likely, despite attempts to correct errors

## 2024-05-31 ENCOUNTER — Telehealth: Payer: Self-pay | Admitting: *Deleted

## 2024-05-31 NOTE — Transitions of Care (Post Inpatient/ED Visit) (Signed)
   05/31/2024  Name: Curtis Hodge MRN: [id number] DOB: 06-23-1966  Today's TOC FU Call Status: Today's TOC FU Call Status:: Unsuccessful Call (1st Attempt) Unsuccessful Call (1st Attempt) Date: 05/31/24  Attempted to reach the patient regarding the most recent Inpatient/ED visit.  Follow Up Plan: Additional outreach attempts will be made to reach the patient to complete the Transitions of Care (Post Inpatient/ED visit) call.   Andrea Dimes RN, BSN [place]  [facility] RN Care Manager [phone]

## 2024-06-01 ENCOUNTER — Telehealth: Payer: Self-pay | Admitting: *Deleted

## 2024-06-01 NOTE — Transitions of Care (Post Inpatient/ED Visit) (Signed)
   06/01/2024  Name: Curtis Hodge MRN: [id number] DOB: [birth year]  Today's TOC FU Call Status: Today's TOC FU Call Status:: Unsuccessful Call (2nd Attempt) Unsuccessful Call (2nd Attempt) Date: 06/01/24  Attempted to reach the patient regarding the most recent Inpatient/ED visit.  Follow Up Plan: Additional outreach attempts will be made to reach the patient to complete the Transitions of Care (Post Inpatient/ED visit) call.   Andrea Dimes RN, BSN [place]  [facility] RN Care Manager [phone]

## 2024-06-02 ENCOUNTER — Telehealth: Payer: Self-pay

## 2024-06-02 NOTE — Transitions of Care (Post Inpatient/ED Visit) (Signed)
   06/02/2024  Name: Lenardo Westwood MRN: [id number] DOB: August 22, 1966  Today's TOC FU Call Status: Today's TOC FU Call Status:: Unsuccessful Call (3rd Attempt) Unsuccessful Call (3rd Attempt) Date: 06/02/24  Attempted to reach the patient regarding the most recent Inpatient/ED visit.  Follow Up Plan: No further outreach attempts will be made at this time. We have been unable to contact the patient.  Khadar Monger J. Fynn Vanblarcom RN, MSN [facility] RN Care Manager Direct Dial: [phone]  Fax: [phone] Website: [email or web address]

## 2024-07-13 ENCOUNTER — Telehealth: Payer: Self-pay

## 2024-07-13 NOTE — Telephone Encounter (Signed)
 Have been unable to reach patient by phone, letter mailed to address on file.   Martika Egler, BSN, RN

## 2024-09-20 ENCOUNTER — Encounter: Payer: Self-pay | Admitting: Radiology

## 2024-09-22 LAB — PNEUMOCYSTIS JIROVECI SMEAR BY DFA

## 2024-12-15 ENCOUNTER — Telehealth: Payer: Self-pay

## 2024-12-15 NOTE — Telephone Encounter (Signed)
 Called [facility] to offer appointment, no answer. Left HIPAA compliant voicemail requesting callback.   Anderson Coppock, BSN, RN
